# Patient Record
Sex: Male | Born: 1945 | Race: White | Hispanic: No | Smoking: Former smoker
Health system: Southern US, Community
[De-identification: ages and names within clinical notes are randomized; demographics above are authoritative.]

## PROBLEM LIST (undated history)

## (undated) DIAGNOSIS — Z9989 Dependence on other enabling machines and devices: Secondary | ICD-10-CM

## (undated) DIAGNOSIS — Z923 Personal history of irradiation: Secondary | ICD-10-CM

## (undated) DIAGNOSIS — K219 Gastro-esophageal reflux disease without esophagitis: Secondary | ICD-10-CM

## (undated) DIAGNOSIS — I2510 Atherosclerotic heart disease of native coronary artery without angina pectoris: Secondary | ICD-10-CM

## (undated) DIAGNOSIS — G4733 Obstructive sleep apnea (adult) (pediatric): Secondary | ICD-10-CM

## (undated) DIAGNOSIS — C801 Malignant (primary) neoplasm, unspecified: Secondary | ICD-10-CM

## (undated) DIAGNOSIS — M1990 Unspecified osteoarthritis, unspecified site: Secondary | ICD-10-CM

## (undated) DIAGNOSIS — I10 Essential (primary) hypertension: Secondary | ICD-10-CM

## (undated) DIAGNOSIS — J189 Pneumonia, unspecified organism: Secondary | ICD-10-CM

## (undated) DIAGNOSIS — F419 Anxiety disorder, unspecified: Secondary | ICD-10-CM

## (undated) DIAGNOSIS — E118 Type 2 diabetes mellitus with unspecified complications: Secondary | ICD-10-CM

## (undated) DIAGNOSIS — E785 Hyperlipidemia, unspecified: Secondary | ICD-10-CM

## (undated) DIAGNOSIS — R0600 Dyspnea, unspecified: Secondary | ICD-10-CM

## (undated) DIAGNOSIS — C7970 Secondary malignant neoplasm of unspecified adrenal gland: Secondary | ICD-10-CM

## (undated) DIAGNOSIS — Z8619 Personal history of other infectious and parasitic diseases: Secondary | ICD-10-CM

## (undated) HISTORY — DX: Obstructive sleep apnea (adult) (pediatric): G47.33

## (undated) HISTORY — DX: Essential (primary) hypertension: I10

## (undated) HISTORY — DX: Personal history of irradiation: Z92.3

## (undated) HISTORY — PX: COLONOSCOPY W/ POLYPECTOMY: SHX1380

## (undated) HISTORY — DX: Dependence on other enabling machines and devices: Z99.89

## (undated) HISTORY — DX: Type 2 diabetes mellitus with unspecified complications: E11.8

## (undated) HISTORY — DX: Hyperlipidemia, unspecified: E78.5

## (undated) HISTORY — DX: Anxiety disorder, unspecified: F41.9

## (undated) HISTORY — DX: Malignant (primary) neoplasm, unspecified: C80.1

## (undated) HISTORY — PX: EYE SURGERY: SHX253

## (undated) HISTORY — PX: CARDIAC CATHETERIZATION: SHX172

---

## 2001-10-10 ENCOUNTER — Emergency Department (HOSPITAL_COMMUNITY): Admission: EM | Admit: 2001-10-10 | Discharge: 2001-10-11 | Payer: Self-pay | Admitting: Emergency Medicine

## 2001-11-15 ENCOUNTER — Ambulatory Visit (HOSPITAL_BASED_OUTPATIENT_CLINIC_OR_DEPARTMENT_OTHER): Admission: RE | Admit: 2001-11-15 | Discharge: 2001-11-15 | Payer: Self-pay | Admitting: Otolaryngology

## 2001-12-14 ENCOUNTER — Encounter: Admission: RE | Admit: 2001-12-14 | Discharge: 2001-12-29 | Payer: Self-pay | Admitting: Orthopedic Surgery

## 2009-03-03 ENCOUNTER — Encounter (INDEPENDENT_AMBULATORY_CARE_PROVIDER_SITE_OTHER): Payer: Self-pay | Admitting: *Deleted

## 2009-09-29 ENCOUNTER — Encounter (INDEPENDENT_AMBULATORY_CARE_PROVIDER_SITE_OTHER): Payer: Self-pay | Admitting: *Deleted

## 2009-10-20 ENCOUNTER — Encounter (INDEPENDENT_AMBULATORY_CARE_PROVIDER_SITE_OTHER): Payer: Self-pay | Admitting: *Deleted

## 2009-10-23 ENCOUNTER — Ambulatory Visit: Payer: Self-pay | Admitting: Gastroenterology

## 2009-11-07 ENCOUNTER — Ambulatory Visit: Payer: Self-pay | Admitting: Gastroenterology

## 2009-11-09 ENCOUNTER — Encounter: Payer: Self-pay | Admitting: Gastroenterology

## 2010-09-23 ENCOUNTER — Emergency Department (HOSPITAL_COMMUNITY): Admission: EM | Admit: 2010-09-23 | Discharge: 2010-09-23 | Payer: Self-pay | Admitting: Emergency Medicine

## 2011-01-08 NOTE — Letter (Signed)
Summary: [facility] Instructions  [facility]  [address] [place], [place] [address]   Phone: [phone]  Fax: [phone]       Christian [name]    1946-05-13    MRN: [id number]        Procedure Day Dorna Bloom: Jake Shark  11/07/09     Arrival Time: 9:30AM     Procedure Time: 10:30AM     Location of Procedure:                    Juliann Pares _  [facility] (4th Floor)                        PREPARATION FOR COLONOSCOPY WITH MOVIPREP   Starting 5 days prior to your procedure 11/02/09 do not eat nuts, seeds, popcorn, corn, beans, peas,  salads, or any raw vegetables.  Do not take any fiber supplements (e.g. Metamucil, Citrucel, and Benefiber).  THE DAY BEFORE YOUR PROCEDURE         DATE: 11/06/09  DAY: MONDAY  1.  Drink clear liquids the entire day-NO SOLID FOOD  2.  Do not drink anything colored red or purple.  Avoid juices with pulp.  No orange juice.  3.  Drink at least 64 oz. (8 glasses) of fluid/clear liquids during the day to prevent dehydration and help the prep work efficiently.  CLEAR LIQUIDS INCLUDE: Water Jello Ice Popsicles Tea (sugar ok, no milk/cream) Powdered fruit flavored drinks Coffee (sugar ok, no milk/cream) Gatorade Juice: apple, white grape, white cranberry  Lemonade Clear bullion, consomm, broth Carbonated beverages (any kind) Strained chicken noodle soup Hard Candy                             4.  In the morning, mix first dose of MoviPrep solution:    Empty 1 Pouch A and 1 Pouch B into the disposable container    Add lukewarm drinking water to the top line of the container. Mix to dissolve    Refrigerate (mixed solution should be used within 24 hrs)  5.  Begin drinking the prep at 5:00 p.m. The MoviPrep container is divided by 4 marks.   Every 15 minutes drink the solution down to the next mark (approximately 8 oz) until the full liter is complete.   6.  Follow completed prep with 16 oz of clear liquid of your choice  (Nothing red or purple).  Continue to drink clear liquids until bedtime.  7.  Before going to bed, mix second dose of MoviPrep solution:    Empty 1 Pouch A and 1 Pouch B into the disposable container    Add lukewarm drinking water to the top line of the container. Mix to dissolve    Refrigerate  THE DAY OF YOUR PROCEDURE      DATE: 11/07/09  DAY: TUESDAY  Beginning at 5:30a.m. (5 hours before procedure):         1. Every 15 minutes, drink the solution down to the next mark (approx 8 oz) until the full liter is complete.  2. Follow completed prep with 16 oz. of clear liquid of your choice.    3. You may drink clear liquids until 8:30AM (2 HOURS BEFORE PROCEDURE).   MEDICATION INSTRUCTIONS  Unless otherwise instructed, you should take regular prescription medications with a small sip of water   as early as possible the morning of your procedure.  OTHER INSTRUCTIONS  You will need a responsible adult at least 63 years of age to accompany you and drive you home.   This person must remain in the waiting room during your procedure.  Wear loose fitting clothing that is easily removed.  Leave jewelry and other valuables at home.  However, you may wish to bring a book to read or  an iPod/MP3 player to listen to music as you wait for your procedure to start.  Remove all body piercing jewelry and leave at home.  Total time from sign-in until discharge is approximately 2-3 hours.  You should go home directly after your procedure and rest.  You can resume normal activities the  day after your procedure.  The day of your procedure you should not:   Drive   Make legal decisions   Operate machinery   Drink alcohol   Return to work  You will receive specific instructions about eating, activities and medications before you leave.    The above instructions have been reviewed and explained to me by   _______________________    I fully understand and can verbalize  these instructions _____________________________ Date _________

## 2011-01-08 NOTE — Letter (Signed)
Summary: Patient Notice- Polyp Results  [facility]  [address] [place], [place] [address]   Phone: [phone]  Fax: [phone]        November 09, 2009 MRN: [id number]    [facility] [address] [place], [place]  [address]    Dear Mr. Crymes,  I am pleased to inform you that the colon polyp(s) removed during your recent colonoscopy was (were) found to be benign (no cancer detected) upon pathologic examination.  I recommend you have a repeat colonoscopy examination in 5 years to look for recurrent polyps, as having colon polyps increases your risk for having recurrent polyps or even colon cancer in the future.  Should you develop new or worsening symptoms of abdominal pain, bowel habit changes or bleeding from the rectum or bowels, please schedule an evaluation with either your primary care physician or with me.  Continue treatment plan as outlined the day of your exam.  Please call us if you are having persistent problems or have questions about your condition that have not been fully answered at this time.  Sincerely,  Meryl Dare MD [facility]  This letter has been electronically signed by your physician.  Appended Document: Patient Notice- Polyp Results Letter mailed 12.6.10.

## 2011-01-08 NOTE — Miscellaneous (Signed)
Summary: [facility] Previsit/prep  Clinical Lists Changes  Medications: Added new medication of MOVIPREP 100 GM  SOLR (PEG-KCL-NACL-NASULF-NA ASC-C) As per prep instructions. - Signed Rx of MOVIPREP 100 GM  SOLR (PEG-KCL-NACL-NASULF-NA ASC-C) As per prep instructions.;  #1 x 0;  Signed;  Entered by: Wyona Almas RN;  Authorized by: Meryl Dare MD [facility];  Method used: Electronically to [facility]  [address] [id number] [address] [place], [place]  [address], Ph: [phone] or [phone], Fax: [phone] Observations: Added new observation of NKA: T (10/23/2009 10:54)    Prescriptions: MOVIPREP 100 GM  SOLR (PEG-KCL-NACL-NASULF-NA ASC-C) As per prep instructions.  #1 x 0   Entered by:   Wyona Almas RN   Authorized by:   Meryl Dare MD [facility]   Signed by:   Wyona Almas RN on 10/23/2009   Method used:   Electronically to        [facility]  [address] [id number] (retail)       [address]       [place], [place]  [address]       Ph: [phone] or [phone]       Fax: [phone]   RxID:   [id number]

## 2011-01-08 NOTE — Letter (Signed)
Summary: Previsit letter  [facility]  [address] [place], [place] [address]   Phone: [phone]  Fax: [phone]       09/29/2009 MRN: [id number]  [facility] [address] [place], [place]  [address]  [place]  Dear Christian [name],  Welcome to the Gastroenterology Division at [facility].    You are scheduled to see a nurse for your pre-procedure visit on 10-23-09 at 11am on the 3rd floor at [facility], [address]  We ask that you try to arrive at our office 15 minutes prior to your appointment time to allow for check-in.  Your nurse visit will consist of discussing your medical and surgical history, your immediate family medical history, and your medications.    Please bring a complete list of all your medications or, if you prefer, bring the medication bottles and we will list them.  We will need to be aware of both prescribed and over the counter drugs.  We will need to know exact dosage information as well.  If you are on blood thinners (Coumadin, Plavix, Aggrenox, Ticlid, etc.) please call our office today/prior to your appointment, as we need to consult with your physician about holding your medication.   Please be prepared to read and sign documents such as consent forms, a financial agreement, and acknowledgement forms.  If necessary, and with your consent, a friend or relative is welcome to sit-in on the nurse visit with you.  Please bring your insurance card so that we may make a copy of it.  If your insurance requires a referral to see a specialist, please bring your referral form from your primary care physician.  No co-pay is required for this nurse visit.     If you cannot keep your appointment, please call [phone] to cancel or reschedule prior to your appointment date.  This allows [place] the opportunity to schedule an appointment for another patient in need of care.    Thank you for choosing [facility] for your medical  needs.  We appreciate the opportunity to care for you.  Please visit [place] at our website  to learn more about our practice.                     Sincerely.                                                                                                                   The Gastroenterology Division

## 2011-01-08 NOTE — Procedures (Signed)
Summary: Colonoscopy  Patient: [name] Note: All result statuses are Final unless otherwise noted.  Tests: (1) Colonoscopy (COL)   COL Colonoscopy           DONE     [facility]     [address]     [place], [place]  [address]           COLONOSCOPY PROCEDURE REPORT           PATIENT:  Christian [name], Christian [name]  MR#:  [id number]     BIRTHDATE:  06-01-1946, 63 yrs. old  GENDER:  male           ENDOSCOPIST:  Judie Petit T. Russella Dar, MD, [facility]           PROCEDURE DATE:  11/07/2009     PROCEDURE:  Colonoscopy with biopsy, with snare polypectomy, and     with hot biopsy     ASA CLASS:  Class II     INDICATIONS:  1) follow-up of polyp, multiple right sided     hyperplastic polyps           MEDICATIONS:   Fentanyl 75 mcg IV, Versed 8 mg IV           DESCRIPTION OF PROCEDURE:   After the risks benefits and     alternatives of the procedure were thoroughly explained, informed     consent was obtained.  Digital rectal exam was performed and     revealed no abnormalities.   The LB [id number]-[id number] endoscope     was introduced through the anus and advanced to the cecum, which     was identified by both the appendix and ileocecal valve, without     limitations.  The quality of the prep was good, using MoviPrep.     The instrument was then slowly withdrawn as the colon was fully     examined.     <<PROCEDUREIMAGES>>           FINDINGS:  A sessile polyp was found in the ascending colon. It     was 6 mm in size. Polyp was snared without cautery. Retrieval was     successful.  A sessile polyp was found in the ascending colon. It     was 3 mm in size. The polyp was removed using cold biopsy forceps.     A sessile polyp was found in the distal transverse colon. It was 4     mm in size. The polyp was removed using cold biopsy forceps.  Six     polyps were found in the rectum. They were 2 - 4 mm in size. With     hot biopsy forceps, the polyps were cauterized and biopsies were     obtained and  sent to pathology. Mild diverticulosis was found in     the sigmoid colon. This was otherwise a normal examination of the     colon.  Retroflexed views in the rectum revealed internal     hemorrhoids, small.  The time to cecum =  2.67  minutes. The scope     was then withdrawn (time =  16.75  min) from the patient and the     procedure completed.           COMPLICATIONS:  None           ENDOSCOPIC IMPRESSION:     1) 6 mm sessile polyp in the ascending colon     2) 3 mm  sessile polyp in the ascending colon     3) 4 mm sessile polyp in the distal transverse colon     4) 2 - 4 mm, six polyps in the rectum     5) Mild diverticulosis in the sigmoid colon     6) Internal hemorrhoids           RECOMMENDATIONS:     1) No aspirin or NSAID's for 2 weeks     2) Await pathology results     3) Repeat Colonoscopy in 5 years.     4) high fiber diet           Myda Detwiler T. Russella Dar, MD, [facility]           n.     eSIGNED:   Venita Lick. Odies Desa at 11/07/2009 10:57 AM           Shearon Balo, [id number]  Note: An exclamation mark (!) indicates a result that was not dispersed into the flowsheet. Document Creation Date: 11/07/2009 10:56 AM _______________________________________________________________________  (1) Order result status: Final Collection or observation date-time: 11/07/2009 10:51 Requested date-time:  Receipt date-time:  Reported date-time:  Referring Physician:   Ordering Physician: Claudette Head [phone]) Specimen Source:  Source: Launa Grill Order Number: [id number] Lab site:   Appended Document: Colonoscopy     Procedures Next Due Date:    Colonoscopy: 10/2014

## 2011-01-08 NOTE — Letter (Signed)
Summary: Recall Colonoscopy Letter  [facility]  [address]   [place], [place] [address]   Phone: [phone]  Fax: [phone]      March 03, 2009 MRN: [id number]   [facility] [address] [place], [place]  [address]   Dear Christian [name],   According to your medical record, it is time for you to schedule a Colonoscopy. The American Cancer Society recommends this procedure as a method to detect early colon cancer. Patients with a family history of colon cancer, or a personal history of colon polyps or inflammatory bowel disease are at increased risk.  This letter has beeen generated based on the recommendations made at the time of your procedure. If you feel that in your particular situation this may no longer apply, please contact our office.  Please call our office at [phone] to schedule this appointment or to update your records at your earliest convenience.  Thank you for cooperating with [place] to provide you with the very best care possible.   Sincerely,  Judie Petit T. Russella Dar, M.D.  [facility] [phone]

## 2012-01-09 ENCOUNTER — Encounter: Payer: Self-pay | Admitting: Physician Assistant

## 2012-01-09 NOTE — Telephone Encounter (Signed)
  Encounter opened in error. csj

## 2012-03-25 ENCOUNTER — Other Ambulatory Visit: Payer: Self-pay | Admitting: Internal Medicine

## 2012-05-14 ENCOUNTER — Encounter: Payer: Self-pay | Admitting: Family Medicine

## 2012-05-14 ENCOUNTER — Ambulatory Visit (INDEPENDENT_AMBULATORY_CARE_PROVIDER_SITE_OTHER): Payer: Federal, State, Local not specified - PPO | Admitting: Family Medicine

## 2012-05-14 ENCOUNTER — Ambulatory Visit: Payer: Federal, State, Local not specified - PPO

## 2012-05-14 VITALS — BP 122/70 | HR 62 | Temp 97.8°F | Resp 20 | Ht 72.0 in | Wt 290.0 lb

## 2012-05-14 DIAGNOSIS — R0600 Dyspnea, unspecified: Secondary | ICD-10-CM

## 2012-05-14 DIAGNOSIS — R05 Cough: Secondary | ICD-10-CM

## 2012-05-14 DIAGNOSIS — I10 Essential (primary) hypertension: Secondary | ICD-10-CM

## 2012-05-14 DIAGNOSIS — Z0000 Encounter for general adult medical examination without abnormal findings: Secondary | ICD-10-CM

## 2012-05-14 DIAGNOSIS — E785 Hyperlipidemia, unspecified: Secondary | ICD-10-CM

## 2012-05-14 DIAGNOSIS — M791 Myalgia, unspecified site: Secondary | ICD-10-CM

## 2012-05-14 DIAGNOSIS — K219 Gastro-esophageal reflux disease without esophagitis: Secondary | ICD-10-CM

## 2012-05-14 DIAGNOSIS — J309 Allergic rhinitis, unspecified: Secondary | ICD-10-CM

## 2012-05-14 DIAGNOSIS — R059 Cough, unspecified: Secondary | ICD-10-CM

## 2012-05-14 DIAGNOSIS — G4700 Insomnia, unspecified: Secondary | ICD-10-CM

## 2012-05-14 LAB — COMPREHENSIVE METABOLIC PANEL
ALT: 17 U/L (ref 0–53)
AST: 16 U/L (ref 0–37)
Albumin: 4.4 g/dL (ref 3.5–5.2)
Alkaline Phosphatase: 61 U/L (ref 39–117)
BUN: 12 mg/dL (ref 6–23)
CO2: 23 mEq/L (ref 19–32)
Calcium: 9.4 mg/dL (ref 8.4–10.5)
Chloride: 106 mEq/L (ref 96–112)
Creat: 0.92 mg/dL (ref 0.50–1.35)
Glucose, Bld: 118 mg/dL — ABNORMAL HIGH (ref 70–99)
Potassium: 4.6 mEq/L (ref 3.5–5.3)
Sodium: 138 mEq/L (ref 135–145)
Total Bilirubin: 0.5 mg/dL (ref 0.3–1.2)
Total Protein: 6.5 g/dL (ref 6.0–8.3)

## 2012-05-14 LAB — POCT URINALYSIS DIPSTICK
Bilirubin, UA: NEGATIVE
Blood, UA: NEGATIVE
Glucose, UA: NEGATIVE
Ketones, UA: NEGATIVE
Leukocytes, UA: NEGATIVE
Nitrite, UA: NEGATIVE
Protein, UA: NEGATIVE
Spec Grav, UA: 1.025
Urobilinogen, UA: 0.2
pH, UA: 5.5

## 2012-05-14 LAB — CBC WITH DIFFERENTIAL/PLATELET
Basophils Absolute: 0.1 10*3/uL (ref 0.0–0.1)
Basophils Relative: 1 % (ref 0–1)
Eosinophils Absolute: 0.2 10*3/uL (ref 0.0–0.7)
Eosinophils Relative: 3 % (ref 0–5)
HCT: 44.3 % (ref 39.0–52.0)
Hemoglobin: 15.2 g/dL (ref 13.0–17.0)
Lymphocytes Relative: 31 % (ref 12–46)
Lymphs Abs: 2.3 10*3/uL (ref 0.7–4.0)
MCH: 33.8 pg (ref 26.0–34.0)
MCHC: 34.3 g/dL (ref 30.0–36.0)
MCV: 98.4 fL (ref 78.0–100.0)
Monocytes Absolute: 0.6 10*3/uL (ref 0.1–1.0)
Monocytes Relative: 9 % (ref 3–12)
Neutro Abs: 4.2 10*3/uL (ref 1.7–7.7)
Neutrophils Relative %: 57 % (ref 43–77)
Platelets: 184 10*3/uL (ref 150–400)
RBC: 4.5 MIL/uL (ref 4.22–5.81)
RDW: 13.2 % (ref 11.5–15.5)
WBC: 7.4 10*3/uL (ref 4.0–10.5)

## 2012-05-14 LAB — IFOBT (OCCULT BLOOD): IFOBT: NEGATIVE

## 2012-05-14 LAB — POCT UA - MICROSCOPIC ONLY
Bacteria, U Microscopic: NEGATIVE
Casts, Ur, LPF, POC: NEGATIVE
Crystals, Ur, HPF, POC: NEGATIVE
Yeast, UA: NEGATIVE

## 2012-05-14 LAB — LIPID PANEL
Cholesterol: 117 mg/dL (ref 0–200)
HDL: 31 mg/dL — ABNORMAL LOW (ref >39)
LDL Cholesterol: 70 mg/dL (ref 0–99)
Total CHOL/HDL Ratio: 3.8 Ratio
Triglycerides: 78 mg/dL (ref <150)
VLDL: 16 mg/dL (ref 0–40)

## 2012-05-14 LAB — PSA: PSA: 1.77 ng/mL (ref <=4.00)

## 2012-05-14 MED ORDER — NIACIN-SIMVASTATIN ER 500-20 MG PO TB24: 1.0000 | ORAL_TABLET | Freq: Every day | ORAL | Status: DC

## 2012-05-14 MED ORDER — FEXOFENADINE-PSEUDOEPHED ER 60-120 MG PO TB12: 1.0000 | ORAL_TABLET | Freq: Two times a day (BID) | ORAL | Status: DC

## 2012-05-14 MED ORDER — CLONAZEPAM 0.5 MG PO TABS: 0.5000 mg | ORAL_TABLET | Freq: Every evening | ORAL | Status: DC | PRN

## 2012-05-14 MED ORDER — ESOMEPRAZOLE MAGNESIUM 20 MG PO CPDR: 20.0000 mg | DELAYED_RELEASE_CAPSULE | Freq: Every day | ORAL | Status: DC

## 2012-05-14 MED ORDER — METHOCARBAMOL 750 MG PO TABS: 750.0000 mg | ORAL_TABLET | Freq: Four times a day (QID) | ORAL | Status: DC

## 2012-05-14 MED ORDER — LISINOPRIL 10 MG PO TABS: 10.0000 mg | ORAL_TABLET | Freq: Every day | ORAL | Status: DC

## 2012-05-14 NOTE — Progress Notes (Addendum)
@UMFCLOGO @  Patient ID: Christian [name] MRN: [id number], DOB: 1946/06/05 66 y.o. Date of Encounter: 05/14/2012, 8:17 AM  Primary Physician: No primary provider on file.  Chief Complaint: Physical (CPE)  HPI: 66 y.o. y/o male with history noted below here for CPE.  Doing well.  Works in Industrial/product designer Has h/o skin cancer He does note shortness of breath on exertion.  He is being followed for this by cardiologist in [facility].  Had cardiac cath 2010 which was negative. Now working two days a week in the [employer] which has dropped off due to internet Wife had ovarian cancer, cva, and hip replacement  Review of Systems: no items checked on blue sheet Consitutional: No fever, chills, fatigue, night sweats, lymphadenopathy, or weight changes. Eyes: No visual changes, eye redness, or discharge. ENT/Mouth: Ears: No otalgia, tinnitus, hearing loss, discharge. Nose: No congestion, rhinorrhea, sinus pain, or epistaxis. Throat: No sore throat, post nasal drip, or teeth pain. Cardiovascular: No CP, palpitations, diaphoresis, DOE, edema, orthopnea, PND. Respiratory: No cough, hemoptysis, SOB, or wheezing. Gastrointestinal: No anorexia, dysphagia, reflux, pain, nausea, vomiting, hematemesis, diarrhea, constipation, BRBPR, or melena. Genitourinary: No dysuria, frequency, urgency, hematuria, incontinence, nocturia, decreased urinary stream, discharge, impotence, or testicular pain/masses. Musculoskeletal: No decreased ROM, myalgias, stiffness, joint swelling, or weakness. Skin: No rash, erythema, lesion changes, pain, warmth, jaundice, or pruritis. Neurological: No headache, dizziness, syncope, seizures, tremors, memory loss, coordination problems, or paresthesias. Psychological: No anxiety, depression, hallucinations, SI/HI. Endocrine: No fatigue, polydipsia, polyphagia, polyuria, or known diabetes. All other systems were reviewed and are otherwise negative.  Past Medical History  Diagnosis  Date  . Hyperlipidemia   . Hypertension   . Anxiety   . Cancer      Past Surgical History  Procedure Date  . Eye surgery     Home Meds:  Prior to Admission medications   Medication Sig Start Date End Date Taking? Authorizing Provider  aspirin 325 MG tablet Take 325 mg by mouth daily.   Yes Historical Provider, MD  clonazePAM (KLONOPIN) 0.5 MG tablet Take 0.5 mg by mouth daily.   Yes Historical Provider, MD  esomeprazole (NEXIUM) 20 MG capsule Take 20 mg by mouth daily before breakfast.   Yes Historical Provider, MD  fexofenadine-pseudoephedrine (ALLEGRA-D) 60-120 MG per tablet Take 1 tablet by mouth 2 (two) times daily.   Yes Historical Provider, MD  ibuprofen (ADVIL,MOTRIN) 800 MG tablet Take 800 mg by mouth every 8 (eight) hours as needed.   Yes Historical Provider, MD  lisinopril (PRINIVIL,ZESTRIL) 5 MG tablet Take 5 mg by mouth daily.   Yes Historical Provider, MD  Melaton-Thean-Cham-PassF-LBalm (MELATONIN + L-THEANINE PO) Take by mouth.   Yes Historical Provider, MD  methocarbamol (ROBAXIN) 750 MG tablet Take 750 mg by mouth 4 (four) times daily.   Yes Historical Provider, MD  niacin-simvastatin (SIMCOR) 500-20 MG 24 hr tablet Take 1 tablet by mouth at bedtime. 500-40 mg per pt   Yes Historical Provider, MD  vitamin E 100 UNIT capsule Take 100 Units by mouth daily.   Yes Historical Provider, MD    Allergies: No Known Allergiesallergic to Celebrex with itching  History   Social History  . Marital Status: Married    Spouse Name: N/A    Number of Children: N/A  . Years of Education: N/A   Occupational History  . Not on file.   Social History Main Topics  . Smoking status: Current Everyday Smoker  . Smokeless tobacco: Not on file  . Alcohol Use:  Not on file  . Drug Use: Not on file  . Sexually Active: Not on file   Other Topics Concern  . Not on file   Social History Narrative  . No narrative on file    Family History  Problem Relation Age of Onset  . Heart  disease Mother   . Heart disease Father     Physical Exam: Blood pressure 122/70, pulse 62, temperature 97.8 F (36.6 C), resp. rate 20, height 6' (1.829 m), weight 290 lb (131.543 kg).  General: Well developed, well nourished, in no acute distress. HEENT: Normocephalic, atraumatic. Conjunctiva pink, sclera non-icteric. Pupils 2 mm constricting to 1 mm, round, regular, and equally reactive to light and accomodation. EOMI. Internal auditory canal clear. TMs with good cone of light and without pathology. Nasal mucosa pink. Nares are without discharge. No sinus tenderness. Oral mucosa pink. Dentition receding gums. Pharynx without exudate.   Neck: Supple. Trachea midline. No thyromegaly. Full ROM. No lymphadenopathy. Lungs: Clear to auscultation bilaterally without wheezes, rales, or rhonchi. Breathing is of normal effort and unlabored. Cardiovascular: RRR with S1 S2. No murmurs, rubs, or gallops appreciated. Distal pulses 2+ symmetrically. No carotid or abdominal bruits. Abdomen: Soft, non-tender, non-distended with normoactive bowel sounds. No hepatosplenomegaly or masses. No rebound/guarding. No CVA tenderness. Without hernias.  Rectal: No external hemorrhoids or fissures. Rectal vault without masses.  Genitourinary:  uncircumcised male. No penile lesions. Testes descended bilaterally, and smooth without tenderness or masses.  Musculoskeletal: Full range of motion and 5/5 strength throughout. Without swelling, atrophy, tenderness, crepitus, or warmth. Extremities without clubbing, cyanosis, or edema. Calves supple. Skin: Warm and moist without erythema, ecchymosis, wounds, or rash. Neuro: A+Ox3. CN II-XII grossly intact. Moves all extremities spontaneously. Full sensation throughout. Normal gait. DTR 2+ throughout upper and lower extremities. Finger to nose intact. Psych:  Responds to questions appropriately with a normal affect.   Studies: CBC, CMET, Lipid, PSA pending [facility] reading (PRIMARY) by   Dr. Milus Glazier CXR:. No infiltrate or cardiomegaly   Assessment/Plan:  66 y.o. y/o  male here for CPE.  He has the following problems: 1. Hypertension  lisinopril (PRINIVIL,ZESTRIL) 10 MG tablet, DG Chest 2 View  2. Insomnia  clonazePAM (KLONOPIN) 0.5 MG tablet  3. GERD (gastroesophageal reflux disease)  esomeprazole (NEXIUM) 20 MG capsule  4. Allergic rhinitis  fexofenadine-pseudoephedrine (ALLEGRA-D) 60-120 MG per tablet  5. Myalgia  methocarbamol (ROBAXIN) 750 MG tablet  6. Hyperlipidemia  niacin-simvastatin (SIMCOR) 500-20 MG 24 hr tablet  7. Dyspnea on exertion  DG Chest 2 View   Given Rx for Zostavax Follow [facility] with [facility] re: DOE -  Signed, Elvina Sidle, MD 05/14/2012 8:17 AM

## 2012-08-28 ENCOUNTER — Other Ambulatory Visit: Payer: Self-pay | Admitting: Internal Medicine

## 2012-11-06 ENCOUNTER — Other Ambulatory Visit: Payer: Self-pay | Admitting: Family Medicine

## 2012-12-12 ENCOUNTER — Ambulatory Visit (INDEPENDENT_AMBULATORY_CARE_PROVIDER_SITE_OTHER): Payer: Federal, State, Local not specified - PPO | Admitting: Family Medicine

## 2012-12-12 VITALS — BP 117/71 | HR 66 | Temp 98.1°F | Resp 20 | Ht 72.0 in | Wt 290.8 lb

## 2012-12-12 DIAGNOSIS — R05 Cough: Secondary | ICD-10-CM

## 2012-12-12 DIAGNOSIS — R509 Fever, unspecified: Secondary | ICD-10-CM

## 2012-12-12 DIAGNOSIS — R059 Cough, unspecified: Secondary | ICD-10-CM

## 2012-12-12 LAB — POCT INFLUENZA A/B
Influenza A, POC: POSITIVE
Influenza B, POC: NEGATIVE

## 2012-12-12 MED ORDER — DOXYCYCLINE HYCLATE 100 MG PO TABS: 100.0000 mg | ORAL_TABLET | Freq: Two times a day (BID) | ORAL | Status: DC

## 2012-12-12 MED ORDER — HYDROCODONE-HOMATROPINE 5-1.5 MG/5ML PO SYRP: 5.0000 mL | ORAL_SOLUTION | Freq: Three times a day (TID) | ORAL | Status: DC | PRN

## 2012-12-12 NOTE — Progress Notes (Signed)
[facility] [address] [place] [address] [phone]  Date:  12/12/2012   Name:  Christian [name]   DOB:  10-Sep-1946   MRN:  [id number]  PCP:  Elvina Sidle, MD    Chief Complaint: Cough, Fever and Shortness of Breath   History of Present Illness:  Christian [name] is a 67 y.o. very pleasant male patient who presents with the following:  He was traveling last weekend- flew to [place] (which is not a long flight) and came home with a cough. His abdominal muscles are sore from coughing so much.  He has been ill for 5-6 days.  He cannot smoke due to being ill.  He is not coughing up much- is sometimes produces some white phlegm.   He has been concerned that he is running a fever- usually in the afternoon he will go up, reports a temperature of 101 last night.   He has noted chills, but no aches, he does have fatigue.      He does not have an earache, but just today he noted a mild ST.   No GI symptoms except his appetite is reduced. He is able to eat, however  There is no problem list on file for this patient.   Past Medical History  Diagnosis Date  . Hyperlipidemia   . Hypertension   . Anxiety   . Cancer     Past Surgical History  Procedure Date  . Eye surgery     History  Substance Use Topics  . Smoking status: Current Every Day Smoker  . Smokeless tobacco: Not on file  . Alcohol Use: Not on file    Family History  Problem Relation Age of Onset  . Heart disease Mother   . Heart disease Father     Allergies  Allergen Reactions  . Celebrex (Celecoxib) Itching    Medication list has been reviewed and updated.  Current Outpatient Prescriptions on File Prior to Visit  Medication Sig Dispense Refill  . aspirin 325 MG tablet Take 325 mg by mouth daily.      . clonazePAM (KLONOPIN) 0.5 MG tablet Take 1 tablet (0.5 mg total) by mouth at bedtime as needed for anxiety.  90 tablet  1  . esomeprazole (NEXIUM) 20 MG capsule Take 1  capsule (20 mg total) by mouth daily before breakfast.  90 capsule  3  . fexofenadine-pseudoephedrine (ALLEGRA-D) 60-120 MG per tablet Take 1 tablet by mouth 2 (two) times daily.  180 tablet  3  . ibuprofen (ADVIL,MOTRIN) 800 MG tablet TAKE 1 TABLET BY MOUTH EVERY 8 HOURS AS NEEDED FOR JOINT PAIN  30 tablet  1  . lisinopril (PRINIVIL,ZESTRIL) 10 MG tablet Take 1 tablet (10 mg total) by mouth daily.  90 tablet  3  . Melaton-Thean-Cham-PassF-LBalm (MELATONIN + L-THEANINE PO) Take by mouth.      . methocarbamol (ROBAXIN) 750 MG tablet TAKE 1 TABLET (750 MG TOTAL) BY MOUTH 4 (FOUR) TIMES DAILY.  180 tablet  1  . niacin-simvastatin (SIMCOR) 500-20 MG 24 hr tablet Take 1 tablet by mouth at bedtime. 500-40 mg per pt  90 tablet  3  . vitamin E 100 UNIT capsule Take 100 Units by mouth daily.        Review of Systems:  As per HPI- otherwise negative.   Physical Examination: Filed Vitals:   12/12/12 1047  BP: 117/71  Pulse: 66  Temp: 98.1 F (36.7 C)  Resp: 20   Filed  Vitals:   12/12/12 1047  Height: 6' (1.829 m)  Weight: 290 lb 12.8 oz (131.906 kg)   Body mass index is 39.44 kg/(m^2). Ideal Body Weight: Weight in (lb) to have BMI = 25: 183.9   GEN: WDWN, NAD, Non-toxic, A & O x 3, appearance of a chronic smoker HEENT: Atraumatic, Normocephalic. Neck supple. No masses, No LAD.  Bilateral TM wnl, oropharynx normal.  PEERL,EOMI.   Ears and Nose: No external deformity. CV: RRR, No M/G/R. No JVD. No thrill. No extra heart sounds. PULM: CTA B, no wheezes, crackles, rhonchi. No retractions. No resp. distress. No accessory muscle use. ABD: S, NT, ND. No rebound. No HSM. Central obesity.  No particular tenderness he is slightly tender in his oblique muscles bilaterally from coughing EXTR: No c/c/e NEURO Normal gait.  PSYCH: Normally interactive. Conversant. Not depressed or anxious appearing.  Calm demeanor.   Results for orders placed in visit on 12/12/12  POCT INFLUENZA A/B      Component  Value Range   Influenza A, POC Positive     Influenza B, POC Negative      Assessment and Plan: 1. Fever  POCT Influenza A/B  2. Cough  HYDROcodone-homatropine (HYCODAN) 5-1.5 MG/5ML syrup, doxycycline (VIBRA-TABS) 100 MG tablet   Influenza.  He has been ill for nearly a week already, so tamilflu is unlikely to be helpful.  He is interested in using an antibiotic, but explained that this will not be helpful either for a viral illness.  Rx for hycodan to use as needed- cautioned regarding sedation.  Did give him an rx for doxy to use if not better in the next few days- he is at higher risk for bronchitis as he is a smoker    Let me know if not better in the next few days- Sooner if worse.     Abbe Amsterdam, MD

## 2012-12-12 NOTE — Patient Instructions (Addendum)
You tested positive for the flu today- remember that the flu is a virus, and antibiotics kill bacteria.  Use the cough syrup as needed -however remember that it can make you sleepy and should not be mixed with klonopin, muscle relaxers or alcohol.  If your are not better in the next few days you can fill and use the doxycycline antibiotic in case you have developed bronchitis on top of your flu. If you are getting worse or are not better in the next few days please let us know

## 2012-12-15 ENCOUNTER — Telehealth: Payer: Self-pay

## 2012-12-15 DIAGNOSIS — R059 Cough, unspecified: Secondary | ICD-10-CM

## 2012-12-15 DIAGNOSIS — R05 Cough: Secondary | ICD-10-CM

## 2012-12-15 NOTE — Telephone Encounter (Signed)
PT STATES HE WAS GIVEN SOME MEDICINE, BUT WOULD LIKE TO HAVE AN INHALER CALLED IN. PLEASE CALL [phone]    [facility] [address]

## 2012-12-16 ENCOUNTER — Telehealth: Payer: Self-pay | Admitting: Radiology

## 2012-12-16 DIAGNOSIS — R05 Cough: Secondary | ICD-10-CM

## 2012-12-16 DIAGNOSIS — R059 Cough, unspecified: Secondary | ICD-10-CM

## 2012-12-16 MED ORDER — ALBUTEROL SULFATE HFA 108 (90 BASE) MCG/ACT IN AERS: 2.0000 | INHALATION_SPRAY | Freq: Four times a day (QID) | RESPIRATORY_TRACT | Status: DC | PRN

## 2012-12-16 NOTE — Telephone Encounter (Signed)
Patient has called about inhaler he has used [employer] in the past and would like this sent in for him. Please advise.

## 2012-12-16 NOTE — Telephone Encounter (Signed)
Dr Patsy Lager, do you want to Rx an inhaler?

## 2012-12-16 NOTE — Telephone Encounter (Signed)
Rx sent 

## 2012-12-16 NOTE — Telephone Encounter (Signed)
Called him back- he is feeling better but would like an albuterol inhaler. He has used these in the past with success.  Will call in albuterol for him

## 2012-12-17 NOTE — Telephone Encounter (Signed)
Patient has already gotten it at the pharmacy.

## 2012-12-21 ENCOUNTER — Other Ambulatory Visit: Payer: Self-pay | Admitting: Family Medicine

## 2013-02-01 ENCOUNTER — Other Ambulatory Visit: Payer: Self-pay | Admitting: Physician Assistant

## 2013-02-01 NOTE — Telephone Encounter (Signed)
Needs office visit.

## 2013-02-08 ENCOUNTER — Other Ambulatory Visit: Payer: Self-pay | Admitting: Family Medicine

## 2013-05-26 ENCOUNTER — Other Ambulatory Visit: Payer: Self-pay | Admitting: Family Medicine

## 2013-06-02 ENCOUNTER — Encounter: Payer: Self-pay | Admitting: Family Medicine

## 2013-06-02 ENCOUNTER — Ambulatory Visit (INDEPENDENT_AMBULATORY_CARE_PROVIDER_SITE_OTHER): Payer: Federal, State, Local not specified - PPO | Admitting: Family Medicine

## 2013-06-02 ENCOUNTER — Ambulatory Visit: Payer: Federal, State, Local not specified - PPO

## 2013-06-02 VITALS — BP 118/71 | HR 76 | Temp 97.6°F | Resp 16 | Ht 72.0 in | Wt 282.6 lb

## 2013-06-02 DIAGNOSIS — G4700 Insomnia, unspecified: Secondary | ICD-10-CM

## 2013-06-02 DIAGNOSIS — M549 Dorsalgia, unspecified: Secondary | ICD-10-CM

## 2013-06-02 DIAGNOSIS — Z0000 Encounter for general adult medical examination without abnormal findings: Secondary | ICD-10-CM

## 2013-06-02 DIAGNOSIS — R0602 Shortness of breath: Secondary | ICD-10-CM

## 2013-06-02 DIAGNOSIS — Z23 Encounter for immunization: Secondary | ICD-10-CM

## 2013-06-02 LAB — CBC
HCT: 45.1 % (ref 39.0–52.0)
Hemoglobin: 15.9 g/dL (ref 13.0–17.0)
MCH: 34 pg (ref 26.0–34.0)
MCHC: 35.3 g/dL (ref 30.0–36.0)
MCV: 96.4 fL (ref 78.0–100.0)
Platelets: 182 10*3/uL (ref 150–400)
RBC: 4.68 MIL/uL (ref 4.22–5.81)
RDW: 13.5 % (ref 11.5–15.5)
WBC: 9.8 10*3/uL (ref 4.0–10.5)

## 2013-06-02 LAB — POCT URINALYSIS DIPSTICK
Bilirubin, UA: NEGATIVE
Blood, UA: NEGATIVE
Glucose, UA: NEGATIVE
Ketones, UA: NEGATIVE
Leukocytes, UA: NEGATIVE
Nitrite, UA: NEGATIVE
Protein, UA: NEGATIVE
Spec Grav, UA: 1.02
Urobilinogen, UA: 0.2
pH, UA: 5.5

## 2013-06-02 LAB — IFOBT (OCCULT BLOOD): IFOBT: NEGATIVE

## 2013-06-02 LAB — POCT GLYCOSYLATED HEMOGLOBIN (HGB A1C): Hemoglobin A1C: 6

## 2013-06-02 MED ORDER — METHOCARBAMOL 750 MG PO TABS: 750.0000 mg | ORAL_TABLET | Freq: Four times a day (QID) | ORAL | Status: DC | PRN

## 2013-06-02 MED ORDER — CLONAZEPAM 0.5 MG PO TABS: 0.5000 mg | ORAL_TABLET | Freq: Every evening | ORAL | Status: DC | PRN

## 2013-06-02 NOTE — Patient Instructions (Signed)
Dentist:  Loni [facility]with on [address] just off [address]  [phone]  Health Maintenance, Males A healthy lifestyle and preventative care can promote health and wellness.  Maintain regular health, dental, and eye exams.  Eat a healthy diet. Foods like vegetables, fruits, whole grains, low-fat dairy products, and lean protein foods contain the nutrients you need without too many calories. Decrease your intake of foods high in solid fats, added sugars, and salt. Get information about a proper diet from your caregiver, if necessary.  Regular physical exercise is one of the most important things you can do for your health. Most adults should get at least 150 minutes of moderate-intensity exercise (any activity that increases your heart rate and causes you to sweat) each week. In addition, most adults need muscle-strengthening exercises on 2 or more days a week.   Maintain a healthy weight. The body mass index (BMI) is a screening tool to identify possible weight problems. It provides an estimate of body fat based on height and weight. Your caregiver can help determine your BMI, and can help you achieve or maintain a healthy weight. For adults 20 years and older:  A BMI below 18.5 is considered underweight.  A BMI of 18.5 to 24.9 is normal.  A BMI of 25 to 29.9 is considered overweight.  A BMI of 30 and above is considered obese.  Maintain normal blood lipids and cholesterol by exercising and minimizing your intake of saturated fat. Eat a balanced diet with plenty of fruits and vegetables. Blood tests for lipids and cholesterol should begin at age 36 and be repeated every 5 years. If your lipid or cholesterol levels are high, you are over 50, or you are a high risk for heart disease, you may need your cholesterol levels checked more frequently.Ongoing high lipid and cholesterol levels should be treated with medicines, if diet and exercise are not effective.  If you smoke, find out from your caregiver  how to quit. If you do not use tobacco, do not start.  If you choose to drink alcohol, do not exceed 2 drinks per day. One drink is considered to be 12 ounces (355 mL) of beer, 5 ounces (148 mL) of wine, or 1.5 ounces (44 mL) of liquor.  Avoid use of street drugs. Do not share needles with anyone. Ask for help if you need support or instructions about stopping the use of drugs.  High blood pressure causes heart disease and increases the risk of stroke. Blood pressure should be checked at least every 1 to 2 years. Ongoing high blood pressure should be treated with medicines if weight loss and exercise are not effective.  If you are 48 to 65 years old, ask your caregiver if you should take aspirin to prevent heart disease.  Diabetes screening involves taking a blood sample to check your fasting blood sugar level. This should be done once every 3 years, after age 32, if you are within normal weight and without risk factors for diabetes. Testing should be considered at a younger age or be carried out more frequently if you are overweight and have at least 1 risk factor for diabetes.  Colorectal cancer can be detected and often prevented. Most routine colorectal cancer screening begins at the age of 57 and continues through age 19. However, your caregiver may recommend screening at an earlier age if you have risk factors for colon cancer. On a yearly basis, your caregiver may provide home test kits to check for hidden blood in  the stool. Use of a small camera at the end of a tube, to directly examine the colon (sigmoidoscopy or colonoscopy), can detect the earliest forms of colorectal cancer. Talk to your caregiver about this at age 62, when routine screening begins. Direct examination of the colon should be repeated every 5 to 10 years through age 74, unless early forms of pre-cancerous polyps or small growths are found.  Hepatitis C blood testing is recommended for all people born from [birth year] through 1963  and any individual with known risks for hepatitis C.  Healthy men should no longer receive prostate-specific antigen (PSA) blood tests as part of routine cancer screening. Consult with your caregiver about prostate cancer screening.  Testicular cancer screening is not recommended for adolescents or adult males who have no symptoms. Screening includes self-exam, caregiver exam, and other screening tests. Consult with your caregiver about any symptoms you have or any concerns you have about testicular cancer.  Practice safe sex. Use condoms and avoid high-risk sexual practices to reduce the spread of sexually transmitted infections (STIs).  Use sunscreen with a sun protection factor (SPF) of 30 or greater. Apply sunscreen liberally and repeatedly throughout the day. You should seek shade when your shadow is shorter than you. Protect yourself by wearing long sleeves, pants, a wide-brimmed hat, and sunglasses year round, whenever you are outdoors.  Notify your caregiver of new moles or changes in moles, especially if there is a change in shape or color. Also notify your caregiver if a mole is larger than the size of a pencil eraser.  A one-time screening for abdominal aortic aneurysm (AAA) and surgical repair of large AAAs by sound wave imaging (ultrasonography) is recommended for ages 27 to 45 years who are current or former smokers.  Stay current with your immunizations. Document Released: 05/23/2008 Document Revised: 02/17/2012 Document Reviewed: 04/22/2011 [facility] Patient Information 2014 [place], [place].

## 2013-06-02 NOTE — Progress Notes (Signed)
Patient ID: Christian [name] MRN: [id number], DOB: 04-30-1946 67 y.o. Date of Encounter: 06/02/2013, 2:14 PM  Primary Physician: Elvina Sidle, MD  Chief Complaint: Physical (CPE)  HPI: 67 y.o. y/o male with history noted below here for CPE.  Doing well. 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 Pt caretaker. Patient has a chronic cough, continues to smoke  Review of Systems: Consitutional: No fever, chills, fatigue, night sweats, lymphadenopathy, or weight changes. Eyes: No visual changes, eye redness, or discharge. ENT/Mouth: Ears: No otalgia, tinnitus, hearing loss, discharge. Nose: No congestion, rhinorrhea, sinus pain, or epistaxis. Throat: No sore throat, post nasal drip, or teeth pain. Cardiovascular: No CP, palpitations, diaphoresis, DOE, edema, orthopnea, PND. Respiratory: No hemoptysis, or wheezing.  Has chronic cough and mild dyspnea on exertion Gastrointestinal: No anorexia, dysphagia, reflux, pain, nausea, vomiting, hematemesis, diarrhea, constipation, BRBPR, or melena. Genitourinary: No dysuria, frequency, urgency, hematuria, incontinence, nocturia, decreased urinary stream, discharge, impotence, or testicular pain/masses. Musculoskeletal: No decreased ROM, myalgias, stiffness, joint swelling, or weakness. Skin: No rash, erythema, lesion changes, pain, warmth, jaundice, or pruritis. Neurological: No headache, dizziness, syncope, seizures, tremors, memory loss, coordination problems, or paresthesias. Psychological: No anxiety, depression, hallucinations, SI/HI. Endocrine: No fatigue, polydipsia, polyphagia, polyuria, or known diabetes. All other systems were reviewed and are otherwise negative.  Past Medical History  Diagnosis Date  . Hyperlipidemia   . Hypertension   . Anxiety   . Cancer      Past Surgical History    Procedure Laterality Date  . Eye surgery      Home Meds:  Prior to Admission medications   Medication Sig Start Date End Date Taking? Authorizing Provider  albuterol (PROVENTIL HFA;VENTOLIN HFA) 108 (90 BASE) MCG/ACT inhaler Inhale 2 puffs into the lungs every 6 (six) hours as needed for wheezing. 12/16/12  Yes Chelle S Jeffery, PA-C  aspirin 325 MG tablet Take 325 mg by mouth daily.   Yes Historical Provider, MD  clonazePAM (KLONOPIN) 0.5 MG tablet TAKE 1 TABLET BY MOUTH AT BEDTIME AS NEEDED FOR ANXIETY 02/08/13  Yes Marzella Schlein McClung, PA-C  esomeprazole (NEXIUM) 20 MG capsule Take 20 mg by mouth as needed. 05/14/12  Yes Elvina Sidle, MD  ibuprofen (ADVIL,MOTRIN) 800 MG tablet TAKE 1 TABLET BY MOUTH EVERY 8 HOURS AS NEEDED FOR JOINT PAIN 08/28/12  Yes Ryan M Dunn, PA-C  lisinopril (PRINIVIL,ZESTRIL) 10 MG tablet Take 1 tablet (10 mg total) by mouth daily. Needs office visit 05/26/13  Yes Heather M Marte, PA-C  Melaton-Thean-Cham-PassF-LBalm (MELATONIN + L-THEANINE PO) Take by mouth.   Yes Historical Provider, MD  methocarbamol (ROBAXIN) 750 MG tablet TAKE 1 TABLET FOUR TIMES A DAY 02/01/13  Yes Ryan M Dunn, PA-C  niacin-simvastatin [facility]) 500-20 MG 24 hr tablet Take 1 tablet by mouth at bedtime. Needs office visit 05/26/13  Yes Nelva Nay, PA-C  vitamin E 100 UNIT capsule Take 100 Units by mouth daily.   Yes Historical Provider, MD    Allergies:  Allergies  Allergen Reactions  . Celebrex (Celecoxib) Itching    History   Social History  . Marital Status: Married    Spouse Name: N/A    Number of Children: N/A  . Years of Education: N/A   Occupational History  . Not on file.   Social History Main Topics  . Smoking status: Current Every Day Smoker  .  Smokeless tobacco: Not on file  . Alcohol Use: Not on file  . Drug Use: Not on file  . Sexually Active: Not on file   Other Topics Concern  . Not on file   Social History Narrative  . No narrative on file    Family History   Problem Relation Age of Onset  . Heart disease Mother   . Heart disease Father     Physical Exam: Blood pressure 118/71, pulse 76, temperature 97.6 F (36.4 C), temperature source Oral, resp. rate 16, height 6' (1.829 m), weight 282 lb 9.6 oz (128.187 kg), SpO2 96.00%.  General: Well developed, well nourished, in no acute distress. HEENT: Normocephalic, atraumatic. Conjunctiva pink, sclera non-icteric. Pupils 2 mm constricting to 1 mm, round, regular, and equally reactive to light and accomodation. EOMI. Internal auditory canal clear. TMs with good cone of light and without pathology. Nasal mucosa pink. Nares are without discharge. No sinus tenderness. Oral mucosa pink. Dentition  Poor condition-missing molars and marked gingival recession. Pharynx without exudate.   Neck: Supple. Trachea midline. No thyromegaly. Full ROM. No lymphadenopathy. Lungs: Clear to auscultation bilaterally without wheezes, rales, or rhonchi. Breathing is of normal effort and unlabored. Cardiovascular: RRR with S1 S2. No murmurs, rubs, or gallops appreciated. Distal pulses 2+ symmetrically. No carotid or abdominal bruits Abdomen: Soft, non-tender, non-distended with normoactive bowel sounds. No hepatosplenomegaly or masses. No rebound/guarding. No CVA tenderness. Without hernias.  Rectal: No external hemorrhoids or fissures. Rectal vault without masses.  Genitourinary:  circumcised male. No penile lesions.  Mild erythema of glans. Testes descended bilaterally, and smooth without tenderness or masses.  Musculoskeletal: Full range of motion and 5/5 strength throughout. Without swelling, atrophy, tenderness, crepitus, or warmth. Extremities without clubbing, cyanosis, or edema. Calves supple. Skin: Warm and moist without erythema, ecchymosis, wounds, or rash. Neuro: A+Ox3. CN II-XII grossly intact. Moves all extremities spontaneously. Full sensation throughout. Normal gait. DTR 2+ throughout upper and lower extremities.  Finger to nose intact. Psych:  Responds to questions appropriately with a normal affect.   Studies: CBC, CMET, Lipid, PSA [facility] reading (PRIMARY) by  Dr. Milus Glazier:  CXR without infiltrate or obvious abnormality. Results for orders placed in visit on 06/02/13  POCT GLYCOSYLATED HEMOGLOBIN (HGB A1C)      Result Value Range   Hemoglobin A1C 6.0    IFOBT (OCCULT BLOOD)      Result Value Range   IFOBT Negative    POCT URINALYSIS DIPSTICK      Result Value Range   Color, UA YELLOW     Clarity, UA CLEAR     Glucose, UA NEG     Bilirubin, UA NEG     Ketones, UA NEG     Spec Grav, UA 1.020     Blood, UA NEG     pH, UA 5.5     Protein, UA NEG     Urobilinogen, UA 0.2     Nitrite, UA NEG     Leukocytes, UA Negative        Assessment/Plan:  67 y.o. y/o  male here for CPE Need for prophylactic vaccination against Streptococcus pneumoniae (pneumococcus) - Plan: Pneumococcal polysaccharide vaccine 23-valent greater than or equal to 2yo subcutaneous/IM  SOB (shortness of breath) - Plan: DG Chest 2 View  Routine general medical examination at a health care facility - Plan: CBC, Comprehensive metabolic panel, Lipid panel, POCT glycosylated hemoglobin (Hb A1C), IFOBT POC (occult bld, rslt in office), POCT urinalysis dipstick, PSA, Sedimentation Rate, CANCELED: POCT  SEDIMENTATION RATE  Insomnia - Plan: clonazePAM (KLONOPIN) 0.5 MG tablet  Back pain - Plan: methocarbamol (ROBAXIN) 750 MG tablet   - Discussed smoking cessation with patient,  He knows he needs to quit Signed, Elvina Sidle, MD 06/02/2013 2:14 PM

## 2013-06-03 LAB — SEDIMENTATION RATE: Sed Rate: 1 mm/hr (ref 0–16)

## 2013-06-03 LAB — COMPREHENSIVE METABOLIC PANEL
ALT: 15 U/L (ref 0–53)
AST: 16 U/L (ref 0–37)
Albumin: 4.8 g/dL (ref 3.5–5.2)
Alkaline Phosphatase: 66 U/L (ref 39–117)
BUN: 16 mg/dL (ref 6–23)
CO2: 22 mEq/L (ref 19–32)
Calcium: 9.5 mg/dL (ref 8.4–10.5)
Chloride: 106 mEq/L (ref 96–112)
Creat: 1.01 mg/dL (ref 0.50–1.35)
Glucose, Bld: 68 mg/dL — ABNORMAL LOW (ref 70–99)
Potassium: 4.9 mEq/L (ref 3.5–5.3)
Sodium: 138 mEq/L (ref 135–145)
Total Bilirubin: 0.6 mg/dL (ref 0.3–1.2)
Total Protein: 7 g/dL (ref 6.0–8.3)

## 2013-06-03 LAB — LIPID PANEL
Cholesterol: 135 mg/dL (ref 0–200)
HDL: 32 mg/dL — ABNORMAL LOW (ref >39)
LDL Cholesterol: 79 mg/dL (ref 0–99)
Total CHOL/HDL Ratio: 4.2 Ratio
Triglycerides: 121 mg/dL (ref <150)
VLDL: 24 mg/dL (ref 0–40)

## 2013-06-03 LAB — PSA: PSA: 1.63 ng/mL (ref <=4.00)

## 2013-07-14 ENCOUNTER — Other Ambulatory Visit: Payer: Self-pay | Admitting: Physician Assistant

## 2013-07-24 ENCOUNTER — Other Ambulatory Visit: Payer: Self-pay | Admitting: Physician Assistant

## 2013-08-12 ENCOUNTER — Other Ambulatory Visit: Payer: Self-pay | Admitting: Physician Assistant

## 2013-10-15 ENCOUNTER — Other Ambulatory Visit: Payer: Self-pay | Admitting: Family Medicine

## 2013-11-18 ENCOUNTER — Other Ambulatory Visit: Payer: Self-pay | Admitting: Family Medicine

## 2013-11-22 ENCOUNTER — Emergency Department (HOSPITAL_COMMUNITY): Payer: Medicare Other

## 2013-11-22 ENCOUNTER — Emergency Department (HOSPITAL_COMMUNITY)
Admission: EM | Admit: 2013-11-22 | Discharge: 2013-11-22 | Disposition: A | Discharge status: Home / Self Care | Payer: Medicare Other | Attending: Emergency Medicine | Admitting: Emergency Medicine

## 2013-11-22 ENCOUNTER — Encounter (HOSPITAL_COMMUNITY): Payer: Self-pay | Admitting: Emergency Medicine

## 2013-11-22 DIAGNOSIS — S42213A Unspecified displaced fracture of surgical neck of unspecified humerus, initial encounter for closed fracture: Secondary | ICD-10-CM | POA: Insufficient documentation

## 2013-11-22 DIAGNOSIS — Z85828 Personal history of other malignant neoplasm of skin: Secondary | ICD-10-CM | POA: Diagnosis not present

## 2013-11-22 DIAGNOSIS — R209 Unspecified disturbances of skin sensation: Secondary | ICD-10-CM | POA: Diagnosis not present

## 2013-11-22 DIAGNOSIS — S4980XA Other specified injuries of shoulder and upper arm, unspecified arm, initial encounter: Secondary | ICD-10-CM | POA: Diagnosis present

## 2013-11-22 DIAGNOSIS — I10 Essential (primary) hypertension: Secondary | ICD-10-CM | POA: Diagnosis not present

## 2013-11-22 DIAGNOSIS — R110 Nausea: Secondary | ICD-10-CM | POA: Diagnosis not present

## 2013-11-22 DIAGNOSIS — Y929 Unspecified place or not applicable: Secondary | ICD-10-CM | POA: Diagnosis not present

## 2013-11-22 DIAGNOSIS — S42301A Unspecified fracture of shaft of humerus, right arm, initial encounter for closed fracture: Secondary | ICD-10-CM

## 2013-11-22 DIAGNOSIS — Z79899 Other long term (current) drug therapy: Secondary | ICD-10-CM | POA: Insufficient documentation

## 2013-11-22 DIAGNOSIS — Y939 Activity, unspecified: Secondary | ICD-10-CM | POA: Insufficient documentation

## 2013-11-22 DIAGNOSIS — E785 Hyperlipidemia, unspecified: Secondary | ICD-10-CM | POA: Diagnosis not present

## 2013-11-22 DIAGNOSIS — S52509A Unspecified fracture of the lower end of unspecified radius, initial encounter for closed fracture: Secondary | ICD-10-CM | POA: Insufficient documentation

## 2013-11-22 DIAGNOSIS — W11XXXA Fall on and from ladder, initial encounter: Secondary | ICD-10-CM | POA: Insufficient documentation

## 2013-11-22 DIAGNOSIS — S42293A Other displaced fracture of upper end of unspecified humerus, initial encounter for closed fracture: Secondary | ICD-10-CM | POA: Insufficient documentation

## 2013-11-22 DIAGNOSIS — F411 Generalized anxiety disorder: Secondary | ICD-10-CM | POA: Diagnosis not present

## 2013-11-22 DIAGNOSIS — S4291XA Fracture of right shoulder girdle, part unspecified, initial encounter for closed fracture: Secondary | ICD-10-CM

## 2013-11-22 DIAGNOSIS — F17200 Nicotine dependence, unspecified, uncomplicated: Secondary | ICD-10-CM | POA: Diagnosis not present

## 2013-11-22 DIAGNOSIS — Z8619 Personal history of other infectious and parasitic diseases: Secondary | ICD-10-CM | POA: Diagnosis not present

## 2013-11-22 DIAGNOSIS — Z7982 Long term (current) use of aspirin: Secondary | ICD-10-CM | POA: Insufficient documentation

## 2013-11-22 DIAGNOSIS — Z888 Allergy status to other drugs, medicaments and biological substances status: Secondary | ICD-10-CM | POA: Insufficient documentation

## 2013-11-22 DIAGNOSIS — S62101A Fracture of unspecified carpal bone, right wrist, initial encounter for closed fracture: Secondary | ICD-10-CM

## 2013-11-22 MED ORDER — OXYCODONE-ACETAMINOPHEN 5-325 MG PO TABS
1.0000 | ORAL_TABLET | Freq: Once | ORAL | Status: AC
  Administered 2013-11-22: 1 via ORAL
  Filled 2013-11-22: qty 1

## 2013-11-22 MED ORDER — HYDROMORPHONE HCL PF 1 MG/ML IJ SOLN: 1.0000 mg | Freq: Once | INTRAMUSCULAR | Status: DC

## 2013-11-22 MED ORDER — HYDROMORPHONE HCL PF 1 MG/ML IJ SOLN
1.0000 mg | Freq: Once | INTRAMUSCULAR | Status: DC
  Filled 2013-11-22: qty 1

## 2013-11-22 MED ORDER — OXYCODONE-ACETAMINOPHEN 10-325 MG PO TABS: 1.0000 | ORAL_TABLET | ORAL | Status: DC | PRN

## 2013-11-22 MED ORDER — HYDROMORPHONE HCL PF 1 MG/ML IJ SOLN
1.0000 mg | Freq: Once | INTRAMUSCULAR | Status: AC
  Administered 2013-11-22: 1 mg via INTRAVENOUS
  Filled 2013-11-22: qty 1

## 2013-11-22 MED ORDER — ONDANSETRON HCL 4 MG/2ML IJ SOLN
4.0000 mg | Freq: Once | INTRAMUSCULAR | Status: AC
  Administered 2013-11-22: 4 mg via INTRAVENOUS
  Filled 2013-11-22: qty 2

## 2013-11-22 NOTE — ED Notes (Signed)
MD at bedside speaking with family 

## 2013-11-22 NOTE — ED Provider Notes (Signed)
CSN: [id number]     Arrival date & time 11/22/13  1458 History   None    Chief Complaint  Patient presents with  . Fall  . Shoulder Injury  . Wrist Injury   (Consider location/radiation/quality/duration/timing/severity/associated sxs/prior Treatment) HPI Christian [name] is a 67 y.o. male w/ PMHx of HTN, HLD, Anxiety, presents to the ED after a fall from a ladder from about 5-10 feet high. The patient claims he fell directly onto his right shoulder and wrist, resulting in the right arm being under his back when he landed on the ground. The patient denies any LOC. Wife called EMS, when EMS arrived, patient still with arm under him. Gave 100 mcg Fentanyl and moved arm to rest on abdomen. Patient w/ pain and numbness in the shoulder and pain in the wrist. No other issues at this time. No chest pain, SOB, abdominal pain. The patient also denies any back pain or neck pain. No loss of ROM in the LE's.   Past Medical History  Diagnosis Date  . Hyperlipidemia   . Hypertension   . Anxiety   . Cancer     skin   Past Surgical History  Procedure Laterality Date  . Eye surgery     Family History  Problem Relation Age of Onset  . Heart disease Mother   . Heart disease Father    History  Substance Use Topics  . Smoking status: Current Every Day Smoker -- 1.00 packs/day    Types: Cigarettes  . Smokeless tobacco: Not on file  . Alcohol Use: Yes     Comment: 1 glass of wine per night    Review of Systems General: Denies fever, chills, diaphoresis, appetite change and fatigue.  Respiratory: Denies SOB, DOE, cough, chest tightness, and wheezing.   Cardiovascular: Denies chest pain, palpitations and leg swelling.  Gastrointestinal: Positive for mild nausea. Denies vomiting, abdominal pain, diarrhea, constipation, blood in stool and abdominal distention.  Genitourinary: Denies dysuria, urgency, frequency, hematuria, flank pain and difficulty urinating.  Endocrine: Denies hot or cold  intolerance, sweats, polyuria, polydipsia. Musculoskeletal: Positive for right shoulder/wrist pain. Denies back pain, joint swelling, and gait problem.  Skin: Denies pallor, rash and wounds.  Neurological: Positive for right shoulder numbness. Denies dizziness, seizures, syncope, weakness, lightheadedness, and headaches.  Psychiatric/Behavioral: Denies mood changes, confusion, nervousness, sleep disturbance and agitation.  Allergies  Celebrex  Home Medications   Current Outpatient Rx  Name  Route  Sig  Dispense  Refill  . aspirin 325 MG tablet   Oral   Take 325 mg by mouth daily.         . clonazePAM (KLONOPIN) 0.5 MG tablet      TAKE 1 TABLET BY MOUTH AT BEDTIME AS NEEDED FOR ANXIETY   90 tablet   1   . lisinopril (PRINIVIL,ZESTRIL) 10 MG tablet   Oral   Take 1 tablet (10 mg total) by mouth daily. PATIENT NEEDS OFFICE VISIT FOR ADDITIONAL REFILLS   30 tablet   0   . Melaton-Thean-Cham-PassF-LBalm (MELATONIN + L-THEANINE PO)   Oral   Take 1 tablet by mouth at bedtime as needed (sleep).          . methocarbamol (ROBAXIN) 750 MG tablet   Oral   Take 1 tablet (750 mg total) by mouth 4 (four) times daily as needed.   180 tablet   3     Needs office visit   . metoprolol succinate (TOPROL-XL) 25 MG 24 hr tablet  Oral   Take 25 mg by mouth daily.         . simvastatin (ZOCOR) 40 MG tablet   Oral   Take 40 mg by mouth at bedtime.         . vitamin E 100 UNIT capsule   Oral   Take 100 Units by mouth daily.          Physical Exam Filed Vitals:   11/22/13 1517 11/22/13 1526 11/22/13 1530  BP:  145/62 148/68  Pulse:   61  Temp:  97.4 F (36.3 C)   TempSrc:  Oral   Resp:  18   Height:  6' (1.829 m)   Weight:  290 lb (131.543 kg)   SpO2: 95% 98% 100%   General: Vital signs reviewed.  Patient is a well-developed and well-nourished, in no acute distress and cooperative with exam. Alert and oriented x3.  Head: Normocephalic and atraumatic. Eyes: PERRL,  EOMI, conjunctivae normal, No scleral icterus.  Neck: Supple, trachea midline, normal ROM, No JVD, masses, thyromegaly, or carotid bruit present.  Cardiovascular: RRR, S1 normal, S2 normal, no murmurs, gallops, or rubs. Pulmonary/Chest: Normal respiratory effort, CTAB, no wheezes, rales, or rhonchi. Abdominal: Soft, non-tender, non-distended, bowel sounds are normal, no masses, organomegaly, or guarding present.  Musculoskeletal: Right shoulder asymmetry and pain w/ crepitus. Right wrist deformity w/ pain. Pulses intact. RIght arm ROM restricted. Able to move right hand, sensation and distal pulses intact. Swelling in right extremity.  Extremities: Pulses symmetric and intact bilaterally. No cyanosis or clubbing. Neurological: A&O x3, cranial nerve II-XII are grossly intact, ROM restricted in right arm, otherwise, no focal motor deficit, sensory intact to light touch bilaterally.  Skin: Warm, dry and intact. No rashes or erythema. Psychiatric: Normal mood and affect. speech and behavior is normal. Cognition and memory are normal.   ED Course  Procedures (including critical care time) Labs Review Labs Reviewed - No data to display Imaging Review Dg Shoulder Right  11/22/2013   CLINICAL DATA:  Larey Seat.  Injured right shoulder.  EXAM: RIGHT SHOULDER - 2+ VIEW  COMPARISON:  None.  FINDINGS: There is a complex multipart fracture of the humeral head and neck. No dislocation. The [facility] joint is intact.  IMPRESSION: Complex multipart right humeral head/ neck fracture.   Electronically Signed   By: Loralie Champagne M.D.   On: 11/22/2013 16:52   Dg Wrist Complete Right  11/22/2013   CLINICAL DATA:  Fall  EXAM: RIGHT WRIST - COMPLETE 3+ VIEW  COMPARISON:  None.  FINDINGS: Three views of the right wrist submitted. There is displaced comminuted fracture of distal right radius. Displaced fracture of the ulnar styloid. Displacement of distal ulna with disruption of the wrist joint.  IMPRESSION: Displaced comminuted  fracture of distal right radius. Displaced fracture of the ulnar styloid. Displacement of distal ulna with disruption of wrist joint.   Electronically Signed   By: Natasha Mead M.D.   On: 11/22/2013 16:45    EKG Interpretation   None      MDM   Christian [name] is a 67 y.o. male w/ PMHx of HTN, HLD, Anxiety, presents to the ED after a fall from ladder. Fell directly on right shoulder w/ obvious ROM restriction and pain in right arm. Crepitus in right shoulder, also w/ right wrist deformity. Otherwise, no signs of rib fractures or other trauma.  -XR R shoulder shows complex multipart right humeral head/ neck fracture. -XR R wrist shows displaced fracture of the  ulnar styloid w/ displacement of distal ulna with disruption of wrist joint. -Dilaudid 1 mg for pain given + percocet 5-325 w/ splint placement. -Zofran 4 mg for nausea.  Discussed w/ orthopedics, Dr. Lajoyce Corners saw patient, feels that surgical intervention is necessary for wrist fracture, shoulder does not need repair. Will give patient sugar tong splint for right wrist w/ shoulder immobilizer and Dr. Audrie Lia office will call patient tomorrow to schedule surgery. Okay for discharge home, given Vicodin 10-325 #30 for pain.  Courtney Paris, MD 11/22/13 [phone]

## 2013-11-22 NOTE — ED Notes (Signed)
Per EMS Pt had a fall at home from a ladder at approximately 5-10 feet. Denies LOC. Pt had obvious deformity to right shoulder and right wrist. Pt also had bruising to left chest. Lung sounds clear and equal bilaterally. Pt able to wiggle fingers but reports decreased sensation to right upper extremity. Pt denies head/neck/back pain. EMS gave 100 mcg of fentanyl. Pt is AAOx4 on arrival. NAD noted. Fentanyl has improved pain.

## 2013-11-22 NOTE — Progress Notes (Signed)
Orthopedic Tech Progress Note Patient Details:  Christian [name] [birth year] [id number]  Patient ID: [name], male   DOB: 1946-05-30, 67 y.o.   MRN: [id number] Viewed order from doctor's order list  Nikki Dom 11/22/2013, 7:11 PM

## 2013-11-22 NOTE — Progress Notes (Signed)
Orthopedic Tech Progress Note Patient Details:  Christian [name] 1946/01/22 [id number]  Ortho Devices Type of Ortho Device: Ace wrap;Arm sling;Sugartong splint Ortho Device/Splint Location: rue Ortho Device/Splint Interventions: Application   [name] 11/22/2013, 7:10 PM

## 2013-11-23 ENCOUNTER — Other Ambulatory Visit (HOSPITAL_COMMUNITY): Payer: Self-pay | Admitting: Orthopedic Surgery

## 2013-11-23 ENCOUNTER — Encounter (HOSPITAL_COMMUNITY): Payer: Self-pay | Admitting: Pharmacy Technician

## 2013-11-24 NOTE — Pre-Procedure Instructions (Signed)
JEHAD BISONO  11/24/2013   Your procedure is scheduled on:  Friday, Dec. 19th.             (Free Valet Parking is available)   Report to [facility]  2 * 3 at  11:15  AM.  Call this number if you have problems the morning of surgery: [phone]   Remember:   Do not eat food or drink liquids after midnight Thursday.   Take these medicines the morning of surgery with A SIP OF WATER: Metoprolol, Oxycodone, Methocarbamol (if needed)   Do not wear jewelry.  Do not wear lotions, powders, or colognes. You may NOT wear deodorant.   Men may shave face and neck.   Do not bring valuables to the hospital.  [facility] is not responsible for any belongings or valuables.               Contacts, dentures or bridgework may not be worn into surgery.  Leave suitcase in the car. After surgery it may be brought to your room.  For patients admitted to the hospital, discharge time is determined by your treatment team.             Name and phone number of your driver:  IBROHIM SIMMERS     [phone]   Special Instructions: Shower using CHG 2 nights before surgery and the night before surgery.  If you shower the day of surgery use CHG.  Use special wash - you have one bottle of CHG for all showers.  You should use approximately 1/3 of the bottle for each shower.   Please read over the following fact sheets that you were given: Pain Booklet, Coughing and Deep Breathing, MRSA Information and Surgical Site Infection Prevention

## 2013-11-25 ENCOUNTER — Encounter (HOSPITAL_COMMUNITY)
Admission: RE | Admit: 2013-11-25 | Discharge: 2013-11-25 | Disposition: A | Discharge status: Home / Self Care | Payer: Medicare Other | Source: Ambulatory Visit | Attending: Orthopedic Surgery | Admitting: Orthopedic Surgery

## 2013-11-25 ENCOUNTER — Encounter (HOSPITAL_COMMUNITY): Payer: Self-pay

## 2013-11-25 HISTORY — DX: Personal history of other infectious and parasitic diseases: Z86.19

## 2013-11-25 LAB — PROTIME-INR
INR: 1.03 (ref 0.00–1.49)
Prothrombin Time: 13.3 seconds (ref 11.6–15.2)

## 2013-11-25 LAB — CBC
HCT: 40.3 % (ref 39.0–52.0)
Hemoglobin: 14.1 g/dL (ref 13.0–17.0)
MCH: 35.9 pg — ABNORMAL HIGH (ref 26.0–34.0)
MCHC: 35 g/dL (ref 30.0–36.0)
MCV: 102.5 fL — ABNORMAL HIGH (ref 78.0–100.0)
Platelets: 164 10*3/uL (ref 150–400)
RBC: 3.93 MIL/uL — ABNORMAL LOW (ref 4.22–5.81)
RDW: 13.3 % (ref 11.5–15.5)
WBC: 12.9 10*3/uL — ABNORMAL HIGH (ref 4.0–10.5)

## 2013-11-25 LAB — COMPREHENSIVE METABOLIC PANEL
ALT: 12 U/L (ref 0–53)
AST: 15 U/L (ref 0–37)
Albumin: 3.8 g/dL (ref 3.5–5.2)
Alkaline Phosphatase: 64 U/L (ref 39–117)
BUN: 16 mg/dL (ref 6–23)
CO2: 22 mEq/L (ref 19–32)
Calcium: 9.3 mg/dL (ref 8.4–10.5)
Chloride: 98 mEq/L (ref 96–112)
Creatinine, Ser: 0.97 mg/dL (ref 0.50–1.35)
GFR calc Af Amer: >90 mL/min (ref >90)
GFR calc non Af Amer: 85 mL/min — ABNORMAL LOW (ref >90)
Glucose, Bld: 111 mg/dL — ABNORMAL HIGH (ref 70–99)
Potassium: 4.3 mEq/L (ref 3.5–5.1)
Sodium: 132 mEq/L — ABNORMAL LOW (ref 135–145)
Total Bilirubin: 0.6 mg/dL (ref 0.3–1.2)
Total Protein: 7.3 g/dL (ref 6.0–8.3)

## 2013-11-25 LAB — APTT: aPTT: 34 seconds (ref 24–37)

## 2013-11-25 MED ORDER — CEFAZOLIN SODIUM-DEXTROSE 2-3 GM-% IV SOLR
2.0000 g | INTRAVENOUS | Status: AC
  Administered 2013-11-26: 2 g via INTRAVENOUS
  Filled 2013-11-25: qty 50

## 2013-11-25 NOTE — ED Provider Notes (Signed)
I saw and evaluated the patient, reviewed the resident's note and I agree with the findings and plan.   .Face to face Exam:  General:  Awake HEENT:  Atraumatic Resp:  Normal effort Abd:  Nondistended Neuro:No focal weakness Lymph: No adenopathy  Nelia Shi, MD 11/25/13 1346

## 2013-11-25 NOTE — Progress Notes (Addendum)
2010 "had a lot of pressure" went to [facility] d/t "pressure" .  Cardiac Cath done..he had some collateral circulation to compensate (Dr. Deno Etienne was cardio)  (Will call [facility] for that record). Last time he saw Deno Etienne was 1 yr ago.  Having no problems now.  DA Had sleep study done 10-12 yrs ago, and wears cpap.  Doesn't know settings.  DA Called [phone] -they will fax [place] and & all cardiac notes & tests---DA

## 2013-11-26 ENCOUNTER — Encounter (HOSPITAL_COMMUNITY)
Admission: RE | Disposition: A | Discharge status: Home / Self Care | Payer: Self-pay | Source: Ambulatory Visit | Attending: Orthopedic Surgery

## 2013-11-26 ENCOUNTER — Ambulatory Visit (HOSPITAL_COMMUNITY): Payer: Medicare Other | Admitting: Anesthesiology

## 2013-11-26 ENCOUNTER — Encounter (HOSPITAL_COMMUNITY): Payer: Self-pay | Admitting: Certified Registered Nurse Anesthetist

## 2013-11-26 ENCOUNTER — Ambulatory Visit (HOSPITAL_COMMUNITY)
Admission: RE | Admit: 2013-11-26 | Discharge: 2013-11-26 | Disposition: A | Discharge status: Home / Self Care | Payer: Medicare Other | Source: Ambulatory Visit | Attending: Orthopedic Surgery | Admitting: Orthopedic Surgery

## 2013-11-26 ENCOUNTER — Encounter (HOSPITAL_COMMUNITY): Payer: Medicare Other | Admitting: Anesthesiology

## 2013-11-26 DIAGNOSIS — I10 Essential (primary) hypertension: Secondary | ICD-10-CM | POA: Insufficient documentation

## 2013-11-26 DIAGNOSIS — Z01810 Encounter for preprocedural cardiovascular examination: Secondary | ICD-10-CM | POA: Insufficient documentation

## 2013-11-26 DIAGNOSIS — F17200 Nicotine dependence, unspecified, uncomplicated: Secondary | ICD-10-CM | POA: Insufficient documentation

## 2013-11-26 DIAGNOSIS — S52599A Other fractures of lower end of unspecified radius, initial encounter for closed fracture: Secondary | ICD-10-CM | POA: Insufficient documentation

## 2013-11-26 DIAGNOSIS — Z01812 Encounter for preprocedural laboratory examination: Secondary | ICD-10-CM | POA: Insufficient documentation

## 2013-11-26 DIAGNOSIS — W11XXXA Fall on and from ladder, initial encounter: Secondary | ICD-10-CM | POA: Insufficient documentation

## 2013-11-26 DIAGNOSIS — S52531A Colles' fracture of right radius, initial encounter for closed fracture: Secondary | ICD-10-CM

## 2013-11-26 HISTORY — PX: OPEN REDUCTION INTERNAL FIXATION (ORIF) DISTAL RADIAL FRACTURE: SHX5989

## 2013-11-26 SURGERY — OPEN REDUCTION INTERNAL FIXATION (ORIF) DISTAL RADIUS FRACTURE
Anesthesia: General | Site: Wrist | Laterality: Right

## 2013-11-26 MED ORDER — BUPIVACAINE-EPINEPHRINE PF 0.5-1:200000 % IJ SOLN
INTRAMUSCULAR | Status: DC | PRN
  Administered 2013-11-26: 28 mL via PERINEURAL

## 2013-11-26 MED ORDER — PHENYLEPHRINE HCL 10 MG/ML IJ SOLN
INTRAMUSCULAR | Status: DC | PRN
  Administered 2013-11-26 (×2): 40 ug via INTRAVENOUS
  Administered 2013-11-26: 80 ug via INTRAVENOUS
  Administered 2013-11-26: 40 ug via INTRAVENOUS

## 2013-11-26 MED ORDER — FENTANYL CITRATE 0.05 MG/ML IJ SOLN
INTRAMUSCULAR | Status: AC
  Administered 2013-11-26: 100 ug
  Filled 2013-11-26: qty 2

## 2013-11-26 MED ORDER — MIDAZOLAM HCL 2 MG/2ML IJ SOLN
INTRAMUSCULAR | Status: AC
  Administered 2013-11-26: 2 mg
  Filled 2013-11-26: qty 2

## 2013-11-26 MED ORDER — 0.9 % SODIUM CHLORIDE (POUR BTL) OPTIME
TOPICAL | Status: DC | PRN
  Administered 2013-11-26: 1000 mL

## 2013-11-26 MED ORDER — OXYCODONE HCL 5 MG PO TABS: 5.0000 mg | ORAL_TABLET | Freq: Once | ORAL | Status: DC | PRN

## 2013-11-26 MED ORDER — PROPOFOL 10 MG/ML IV BOLUS: INTRAVENOUS | Status: DC | PRN

## 2013-11-26 MED ORDER — PROPOFOL 10 MG/ML IV BOLUS
INTRAVENOUS | Status: DC | PRN
  Administered 2013-11-26: 170 mg via INTRAVENOUS

## 2013-11-26 MED ORDER — ONDANSETRON HCL 4 MG/2ML IJ SOLN: 4.0000 mg | Freq: Once | INTRAMUSCULAR | Status: DC | PRN

## 2013-11-26 MED ORDER — ALBUTEROL SULFATE (5 MG/ML) 0.5% IN NEBU
INHALATION_SOLUTION | RESPIRATORY_TRACT | Status: AC
  Filled 2013-11-26: qty 0.5

## 2013-11-26 MED ORDER — LIDOCAINE HCL (CARDIAC) 20 MG/ML IV SOLN
INTRAVENOUS | Status: DC | PRN
  Administered 2013-11-26: 80 mg via INTRAVENOUS

## 2013-11-26 MED ORDER — ONDANSETRON HCL 4 MG/2ML IJ SOLN
INTRAMUSCULAR | Status: DC | PRN
  Administered 2013-11-26: 4 mg via INTRAVENOUS

## 2013-11-26 MED ORDER — HYDROCODONE-ACETAMINOPHEN 5-325 MG PO TABS: 1.0000 | ORAL_TABLET | Freq: Four times a day (QID) | ORAL | Status: DC | PRN

## 2013-11-26 MED ORDER — LACTATED RINGERS IV SOLN
INTRAVENOUS | Status: DC
  Administered 2013-11-26: 12:00:00 via INTRAVENOUS

## 2013-11-26 MED ORDER — HYDROMORPHONE HCL PF 1 MG/ML IJ SOLN: 0.2500 mg | INTRAMUSCULAR | Status: DC | PRN

## 2013-11-26 MED ORDER — LACTATED RINGERS IV SOLN
INTRAVENOUS | Status: DC | PRN
  Administered 2013-11-26: 13:00:00 via INTRAVENOUS

## 2013-11-26 MED ORDER — PHENYLEPHRINE HCL 10 MG/ML IJ SOLN
10.0000 mg | INTRAVENOUS | Status: DC | PRN
  Administered 2013-11-26: 25 ug/min via INTRAVENOUS

## 2013-11-26 MED ORDER — OXYCODONE HCL 5 MG/5ML PO SOLN: 5.0000 mg | Freq: Once | ORAL | Status: DC | PRN

## 2013-11-26 SURGICAL SUPPLY — 65 items
BANDAGE ELASTIC 4 VELCRO ST LF (GAUZE/BANDAGES/DRESSINGS) ×1 IMPLANT
BANDAGE GAUZE ELAST BULKY 4 IN (GAUZE/BANDAGES/DRESSINGS) ×1 IMPLANT
BIT DRILL 2.2 SS TIBIAL (BIT) ×2 IMPLANT
BNDG CMPR 9X4 STRL LF SNTH (GAUZE/BANDAGES/DRESSINGS) ×1
BNDG COHESIVE 4X5 TAN STRL (GAUZE/BANDAGES/DRESSINGS) ×2 IMPLANT
BNDG ESMARK 4X9 LF (GAUZE/BANDAGES/DRESSINGS) ×2 IMPLANT
CLOTH BEACON ORANGE TIMEOUT ST (SAFETY) ×2 IMPLANT
COVER SURGICAL LIGHT HANDLE (MISCELLANEOUS) ×2 IMPLANT
CUFF TOURNIQUET SINGLE 18IN (TOURNIQUET CUFF) ×1 IMPLANT
CUFF TOURNIQUET SINGLE 24IN (TOURNIQUET CUFF) IMPLANT
DRAPE INCISE IOBAN 66X45 STRL (DRAPES) IMPLANT
DRAPE OEC MINIVIEW 54X84 (DRAPES) ×2 IMPLANT
DRAPE U-SHAPE 47X51 STRL (DRAPES) ×2 IMPLANT
DRSG ADAPTIC 3X8 NADH LF (GAUZE/BANDAGES/DRESSINGS) ×1 IMPLANT
DRSG EMULSION OIL 3X3 NADH (GAUZE/BANDAGES/DRESSINGS) IMPLANT
DRSG PAD ABDOMINAL 8X10 ST (GAUZE/BANDAGES/DRESSINGS) IMPLANT
DURAPREP 26ML APPLICATOR (WOUND CARE) ×2 IMPLANT
ELECT REM PT RETURN 9FT ADLT (ELECTROSURGICAL) ×2
ELECTRODE REM PT RTRN 9FT ADLT (ELECTROSURGICAL) ×1 IMPLANT
GAUZE SPONGE 4X4 16PLY XRAY LF (GAUZE/BANDAGES/DRESSINGS) ×2 IMPLANT
GLOVE BIOGEL PI IND STRL 9 (GLOVE) ×1 IMPLANT
GLOVE BIOGEL PI INDICATOR 9 (GLOVE) ×1
GLOVE SURG ORTHO 9.0 STRL STRW (GLOVE) ×2 IMPLANT
GOWN SRG XL XLNG 56XLVL 4 (GOWN DISPOSABLE) ×2 IMPLANT
GOWN STRL NON-REIN XL XLG LVL4 (GOWN DISPOSABLE) ×4
K-WIRE 1.6 (WIRE) ×4
K-WIRE FX5X1.6XNS BN SS (WIRE) ×2
KIT BASIN OR (CUSTOM PROCEDURE TRAY) ×2 IMPLANT
KIT ROOM TURNOVER OR (KITS) ×2 IMPLANT
KWIRE FX5X1.6XNS BN SS (WIRE) IMPLANT
MANIFOLD NEPTUNE II (INSTRUMENTS) ×2 IMPLANT
NS IRRIG 1000ML POUR BTL (IV SOLUTION) ×2 IMPLANT
PACK ORTHO EXTREMITY (CUSTOM PROCEDURE TRAY) ×2 IMPLANT
PAD ABD 8X10 STRL (GAUZE/BANDAGES/DRESSINGS) ×1 IMPLANT
PAD ARMBOARD 7.5X6 YLW CONV (MISCELLANEOUS) ×4 IMPLANT
PAD CAST 4YDX4 CTTN HI CHSV (CAST SUPPLIES) IMPLANT
PADDING CAST COTTON 4X4 STRL (CAST SUPPLIES) ×2
PEG LOCKING SMOOTH 2.2X20 (Screw) ×2 IMPLANT
PEG LOCKING SMOOTH 2.2X24 (Peg) ×2 IMPLANT
PLATE DVR CROSSLOCK STD RT (Plate) ×1 IMPLANT
SCREW  LP NL 2.7X16MM (Screw) ×2 IMPLANT
SCREW LOCK 16X2.7X 3 LD TPR (Screw) IMPLANT
SCREW LOCK 18X2.7X 3 LD TPR (Screw) IMPLANT
SCREW LOCK 20X2.7X 3 LD TPR (Screw) IMPLANT
SCREW LOCKING 2.7X16 (Screw) ×8 IMPLANT
SCREW LOCKING 2.7X18 (Screw) ×2 IMPLANT
SCREW LOCKING 2.7X20MM (Screw) ×2 IMPLANT
SCREW LP NL 2.7X16MM (Screw) IMPLANT
SCREW NONLOCK 2.7X18MM (Screw) ×1 IMPLANT
SPONGE GAUZE 4X4 12PLY (GAUZE/BANDAGES/DRESSINGS) ×1 IMPLANT
SPONGE LAP 18X18 X RAY DECT (DISPOSABLE) ×4 IMPLANT
STAPLER VISISTAT 35W (STAPLE) ×2 IMPLANT
STRIP CLOSURE SKIN 1/2X4 (GAUZE/BANDAGES/DRESSINGS) ×2 IMPLANT
SUCTION FRAZIER TIP 10 FR DISP (SUCTIONS) ×2 IMPLANT
SUT ETHILON 3 0 PS 1 (SUTURE) ×2 IMPLANT
SUT PROLENE 3 0 PS 1 (SUTURE) ×2 IMPLANT
SUT VIC AB 2-0 CTB1 (SUTURE) ×1 IMPLANT
SUT VIC AB 3-0 X1 27 (SUTURE) ×2 IMPLANT
SUT VICRYL 4-0 PS2 18IN ABS (SUTURE) IMPLANT
SYR BULB 3OZ (MISCELLANEOUS) ×2 IMPLANT
TOWEL OR 17X24 6PK STRL BLUE (TOWEL DISPOSABLE) ×2 IMPLANT
TOWEL OR 17X26 10 PK STRL BLUE (TOWEL DISPOSABLE) ×2 IMPLANT
TUBE CONNECTING 12X1/4 (SUCTIONS) ×2 IMPLANT
WATER STERILE IRR 1000ML POUR (IV SOLUTION) ×2 IMPLANT
YANKAUER SUCT BULB TIP NO VENT (SUCTIONS) ×2 IMPLANT

## 2013-11-26 NOTE — Anesthesia Procedure Notes (Addendum)
Anesthesia Regional Block:  Supraclavicular block  Pre-Anesthetic Checklist: ,, timeout performed, Correct Patient, Correct Site, Correct Laterality, Correct Procedure, Correct Position, site marked, Risks and benefits discussed,  Surgical consent,  Pre-op evaluation,  At surgeon's request and post-op pain management  Laterality: Right and Upper  Prep: chloraprep       Needles:  Injection technique: Single-shot  Needle Type: Echogenic Stimulator Needle     Needle Length: 5cm 5 cm Needle Gauge: 21 and 21 G    Additional Needles:  Procedures: ultrasound guided (picture in chart) Supraclavicular block Narrative:  Start time: 11/26/2013 11:50 AM End time: 11/26/2013 12:00 PM Injection made incrementally with aspirations every 5 mL.  Performed by: Personally  Anesthesiologist: Sheldon Silvan

## 2013-11-26 NOTE — H&P (Signed)
Christian [name] is an 67 y.o. male.   Chief Complaint: Closed right distal radius fracture HPI: Patient is a 67 year old gentleman who sustained a fall with right rib fractures right proximal humerus fracture and a right distal radius fracture  Past Medical History  Diagnosis Date  . Hyperlipidemia   . Hypertension   . Anxiety   . Cancer     skin  . History of shingles     2004    Past Surgical History  Procedure Laterality Date  . Cardiac catheterization      2010 @ [facility] (per pt)  . Eye surgery Bilateral     cataracts    Family History  Problem Relation Age of Onset  . Heart disease Mother   . Heart disease Father    Social History:  reports that he has been smoking Cigarettes.  He has a 40 pack-year smoking history. He does not have any smokeless tobacco history on file. He reports that he drinks alcohol. He reports that he does not use illicit drugs.  Allergies:  Allergies  Allergen Reactions  . Celebrex [Celecoxib] Itching    No prescriptions prior to admission    Results for orders placed during the hospital encounter of 11/25/13 (from the past 48 hour(s))  APTT     Status: None   Collection Time    11/25/13  1:30 PM      Result Value Range   aPTT 34  24 - 37 seconds  CBC     Status: Abnormal   Collection Time    11/25/13  1:30 PM      Result Value Range   WBC 12.9 (*) 4.0 - 10.5 K/uL   RBC 3.93 (*) 4.22 - 5.81 MIL/uL   Hemoglobin 14.1  13.0 - 17.0 g/dL   HCT 16.1  09.6 - 04.5 %   MCV 102.5 (*) 78.0 - 100.0 fL   MCH 35.9 (*) 26.0 - 34.0 pg   MCHC 35.0  30.0 - 36.0 g/dL   RDW 40.9  81.1 - 91.4 %   Platelets 164  150 - 400 K/uL  COMPREHENSIVE METABOLIC PANEL     Status: Abnormal   Collection Time    11/25/13  1:30 PM      Result Value Range   Sodium 132 (*) 135 - 145 mEq/L   Potassium 4.3  3.5 - 5.1 mEq/L   Chloride 98  96 - 112 mEq/L   CO2 22  19 - 32 mEq/L   Glucose, Bld 111 (*) 70 - 99 mg/dL   BUN 16  6 - 23 mg/dL   Creatinine, Ser  7.82  0.50 - 1.35 mg/dL   Calcium 9.3  8.4 - 95.6 mg/dL   Total Protein 7.3  6.0 - 8.3 g/dL   Albumin 3.8  3.5 - 5.2 g/dL   AST 15  0 - 37 U/L   ALT 12  0 - 53 U/L   Alkaline Phosphatase 64  39 - 117 U/L   Total Bilirubin 0.6  0.3 - 1.2 mg/dL   GFR calc non Af Amer 85 (*) >90 mL/min   GFR calc Af Amer >90  >90 mL/min   Comment: (NOTE)     The eGFR has been calculated using the CKD EPI equation.     This calculation has not been validated in all clinical situations.     eGFR's persistently <90 mL/min signify possible Chronic Kidney     Disease.  PROTIME-INR  Status: None   Collection Time    11/25/13  1:30 PM      Result Value Range   Prothrombin Time 13.3  11.6 - 15.2 seconds   INR 1.03  0.00 - 1.49   No results found.  Review of Systems  All other systems reviewed and are negative.    There were no vitals taken for this visit. Physical Exam  On examination there is no blisters no ulcers his hand is grossly neurovascularly intact. Radiographs shows a intra-articular fracture of the right distal radius Assessment/Plan Assessment: Intra-articular right distal radius fracture.  Plan: Will plan for open reduction internal fixation. Risks and benefits were discussed including arthritis neurovascular injury pain infection need for additional surgery. Patient states he understands and wished to proceed at this time.  Christian [name] V 11/26/2013, 6:26 AM

## 2013-11-26 NOTE — Anesthesia Preprocedure Evaluation (Signed)
Anesthesia Evaluation  Patient identified by MRN, date of birth, ID band Patient awake    Reviewed: Allergy & Precautions, H&P , NPO status , Patient's Chart, lab work & pertinent test results, reviewed documented beta blocker date and time   Airway Mallampati: II  Neck ROM: Full    Dental  (+) Poor Dentition and Dental Advisory Given   Pulmonary Current Smoker,  breath sounds clear to auscultation        Cardiovascular hypertension, Pt. on medications and Pt. on home beta blockers Rhythm:Regular Rate:Normal     Neuro/Psych    GI/Hepatic   Endo/Other    Renal/GU      Musculoskeletal   Abdominal   Peds  Hematology   Anesthesia Other Findings   Reproductive/Obstetrics                           Anesthesia Physical Anesthesia Plan  ASA: III  Anesthesia Plan: General   Post-op Pain Management:    Induction: Intravenous  Airway Management Planned: LMA  Additional Equipment:   Intra-op Plan:   Post-operative Plan: Extubation in OR  Informed Consent: I have reviewed the patients History and Physical, chart, labs and discussed the procedure including the risks, benefits and alternatives for the proposed anesthesia with the patient or authorized representative who has indicated his/her understanding and acceptance.   Dental advisory given  Plan Discussed with:   Anesthesia Plan Comments:         Anesthesia Quick Evaluation

## 2013-11-26 NOTE — Progress Notes (Signed)
Dr Ivin Booty came to see pt. Stressed importance of NO SMOKING & using CPAP as soon as he gets home today. This was also stressed to spouse and they verbalize understanding.

## 2013-11-26 NOTE — Transfer of Care (Signed)
Immediate Anesthesia Transfer of Care Note  Patient: Christian [name]  Procedure(s) Performed: Procedure(s) with comments: OPEN REDUCTION INTERNAL FIXATION (ORIF) DISTAL RADIAL FRACTURE (Right) - Open Reduction Internal Fixation Right Distal Radius   Patient Location: PACU  Anesthesia Type:General  Level of Consciousness: awake, alert  and oriented  Airway & Oxygen Therapy: Patient Spontanous Breathing  Post-op Assessment: Report given to PACU RN  Post vital signs: Reviewed and stable  Complications: No apparent anesthesia complications

## 2013-11-26 NOTE — Progress Notes (Signed)
Pt has increased work of breathing, expir wheezes, abd retractions upon adm to PACU. Changed to O2 by [place] as mask seems to agitate him. CRNA notified Dr Ivin Booty, new order for Albuterol neb-given to pt with improvement  and decreased wheezing. O2 sats 100% thruout. Will cont to monitor closely.

## 2013-11-26 NOTE — Op Note (Signed)
OPERATIVE REPORT  DATE OF SURGERY: 11/26/2013  PATIENT:  Christian [name],  67 y.o. male  PRE-OPERATIVE DIAGNOSIS:  Right Distal Radius Fracture  POST-OPERATIVE DIAGNOSIS:  Right Distal Radius Fracture  PROCEDURE:  Procedure(s): OPEN REDUCTION INTERNAL FIXATION (ORIF) DISTAL RADIAL FRACTURE  SURGEON:  Surgeon(s): Nadara Mustard, MD  ANESTHESIA:   general  EBL:  Minimal ML  SPECIMEN:  No Specimen  TOURNIQUET:  * No tourniquets in log *  PROCEDURE DETAILS: Patient is a 67 year old gentleman who fell from a ladder sustaining a right proximal humerus fracture right rib fractures and intra-articular right distal radius fracture. Patient presents at this time for open reduction internal fixation of the right wrist. Risks and benefits were discussed including infection neurovascular injury arthritis need for additional surgery. Patient states he understands and wished to proceed at this time. Description of procedure patient was brought to the operating room and underwent a general anesthetic. After adequate levels of anesthesia were obtained patient's right upper extremity was prepped using DuraPrep and draped into a sterile field. An extensile approach of Sherilyn Cooter was used this was bluntly carried down to the FCR. The FCR sheath was incised and the FCR was retracted ulnarly. The neurovascular bundle was retracted radially. The quadratus was elevated the bone edges were freshened and the fracture was reduced. The plate was applied stabilized with a K wire to aligned he plate with the C-arm. The plate was then secured proximally with 2 compression screws 2 locking screws and distally with 3 threaded screws and 3 smooth screws. The wound was irrigated with normal saline. C-arm fluoroscopy verified reduction. Subcutaneous is closed using 2-0 Vicryl. The skin was closed using staples. The wound was covered with Adaptic orthopedic sponges AB dressing Kerlix and Coban.  PLAN OF CARE: Discharge to home  after PACU  PATIENT DISPOSITION:  PACU - hemodynamically stable.   Nadara Mustard, MD 11/26/2013 1:55 PM

## 2013-11-26 NOTE — Anesthesia Postprocedure Evaluation (Signed)
  Anesthesia Post-op Note  Patient: Christian [name]  Procedure(s) Performed: Procedure(s) with comments: OPEN REDUCTION INTERNAL FIXATION (ORIF) DISTAL RADIAL FRACTURE (Right) - Open Reduction Internal Fixation Right Distal Radius   Patient Location: PACU  Anesthesia Type:GA combined with regional for post-op pain  Level of Consciousness: awake, alert  and oriented  Airway and Oxygen Therapy: Patient Spontanous Breathing and Patient connected to face mask oxygen  Post-op Pain: none  Post-op Assessment: Post-op Vital signs reviewed  Post-op Vital Signs: Reviewed  Complications: No apparent anesthesia complications

## 2013-11-26 NOTE — Preoperative (Signed)
Beta Blockers   Reason not to administer Beta Blockers:Not Applicable 

## 2013-11-26 NOTE — Progress Notes (Signed)
Got pt up in recliner-his breathing "feels better" when in chair. Says he has been sleeping in recliner at home all week. Dr Ivin Booty here to see pt--updated. RA sat 91-93%. Will cont to monitor.

## 2013-11-30 ENCOUNTER — Encounter (HOSPITAL_COMMUNITY): Payer: Self-pay | Admitting: Orthopedic Surgery

## 2013-12-17 ENCOUNTER — Other Ambulatory Visit: Payer: Self-pay | Admitting: Family Medicine

## 2014-01-28 ENCOUNTER — Other Ambulatory Visit: Payer: Self-pay | Admitting: Family Medicine

## 2014-05-07 ENCOUNTER — Other Ambulatory Visit: Payer: Self-pay | Admitting: Family Medicine

## 2014-05-09 NOTE — Telephone Encounter (Signed)
appt sch 06/23/14. Do you want to RF until then?

## 2014-05-10 ENCOUNTER — Other Ambulatory Visit: Payer: Self-pay | Admitting: Family Medicine

## 2014-05-10 NOTE — Telephone Encounter (Signed)
Called in.

## 2014-06-23 ENCOUNTER — Encounter: Payer: Federal, State, Local not specified - PPO | Admitting: Family Medicine

## 2014-07-28 ENCOUNTER — Ambulatory Visit (INDEPENDENT_AMBULATORY_CARE_PROVIDER_SITE_OTHER): Payer: Medicare Other

## 2014-07-28 ENCOUNTER — Ambulatory Visit (INDEPENDENT_AMBULATORY_CARE_PROVIDER_SITE_OTHER): Payer: Medicare Other | Admitting: Family Medicine

## 2014-07-28 ENCOUNTER — Encounter: Payer: Self-pay | Admitting: Family Medicine

## 2014-07-28 VITALS — BP 125/71 | HR 60 | Temp 97.6°F | Resp 16 | Ht 72.0 in | Wt 281.0 lb

## 2014-07-28 DIAGNOSIS — J42 Unspecified chronic bronchitis: Secondary | ICD-10-CM

## 2014-07-28 DIAGNOSIS — M545 Low back pain, unspecified: Secondary | ICD-10-CM

## 2014-07-28 DIAGNOSIS — G8929 Other chronic pain: Secondary | ICD-10-CM

## 2014-07-28 DIAGNOSIS — J449 Chronic obstructive pulmonary disease, unspecified: Secondary | ICD-10-CM | POA: Insufficient documentation

## 2014-07-28 DIAGNOSIS — F411 Generalized anxiety disorder: Secondary | ICD-10-CM | POA: Insufficient documentation

## 2014-07-28 DIAGNOSIS — N400 Enlarged prostate without lower urinary tract symptoms: Secondary | ICD-10-CM

## 2014-07-28 DIAGNOSIS — Z125 Encounter for screening for malignant neoplasm of prostate: Secondary | ICD-10-CM

## 2014-07-28 DIAGNOSIS — I10 Essential (primary) hypertension: Secondary | ICD-10-CM | POA: Insufficient documentation

## 2014-07-28 DIAGNOSIS — E785 Hyperlipidemia, unspecified: Secondary | ICD-10-CM | POA: Insufficient documentation

## 2014-07-28 DIAGNOSIS — Z0000 Encounter for general adult medical examination without abnormal findings: Secondary | ICD-10-CM

## 2014-07-28 LAB — CBC WITH DIFFERENTIAL/PLATELET
Basophils Absolute: 0.1 10*3/uL (ref 0.0–0.1)
Basophils Relative: 1 % (ref 0–1)
Eosinophils Absolute: 0.3 10*3/uL (ref 0.0–0.7)
Eosinophils Relative: 4 % (ref 0–5)
HCT: 42.6 % (ref 39.0–52.0)
Hemoglobin: 14.7 g/dL (ref 13.0–17.0)
Lymphocytes Relative: 32 % (ref 12–46)
Lymphs Abs: 2.5 10*3/uL (ref 0.7–4.0)
MCH: 34.6 pg — ABNORMAL HIGH (ref 26.0–34.0)
MCHC: 34.5 g/dL (ref 30.0–36.0)
MCV: 100.2 fL — ABNORMAL HIGH (ref 78.0–100.0)
Monocytes Absolute: 0.8 10*3/uL (ref 0.1–1.0)
Monocytes Relative: 10 % (ref 3–12)
Neutro Abs: 4.1 10*3/uL (ref 1.7–7.7)
Neutrophils Relative %: 53 % (ref 43–77)
Platelets: 164 10*3/uL (ref 150–400)
RBC: 4.25 MIL/uL (ref 4.22–5.81)
RDW: 13.2 % (ref 11.5–15.5)
WBC: 7.8 10*3/uL (ref 4.0–10.5)

## 2014-07-28 LAB — COMPLETE METABOLIC PANEL WITH GFR
ALT: 13 U/L (ref 0–53)
AST: 13 U/L (ref 0–37)
Albumin: 4.3 g/dL (ref 3.5–5.2)
Alkaline Phosphatase: 63 U/L (ref 39–117)
BUN: 16 mg/dL (ref 6–23)
CO2: 23 mEq/L (ref 19–32)
Calcium: 8.9 mg/dL (ref 8.4–10.5)
Chloride: 106 mEq/L (ref 96–112)
Creat: 0.85 mg/dL (ref 0.50–1.35)
GFR, Est African American: >89 mL/min
GFR, Est Non African American: >89 mL/min
Glucose, Bld: 113 mg/dL — ABNORMAL HIGH (ref 70–99)
Potassium: 4.5 mEq/L (ref 3.5–5.3)
Sodium: 138 mEq/L (ref 135–145)
Total Bilirubin: 0.5 mg/dL (ref 0.2–1.2)
Total Protein: 6.5 g/dL (ref 6.0–8.3)

## 2014-07-28 LAB — IFOBT (OCCULT BLOOD): IFOBT: NEGATIVE

## 2014-07-28 LAB — LIPID PANEL
Cholesterol: 113 mg/dL (ref 0–200)
HDL: 32 mg/dL — ABNORMAL LOW (ref >39)
LDL Cholesterol: 66 mg/dL (ref 0–99)
Total CHOL/HDL Ratio: 3.5 Ratio
Triglycerides: 74 mg/dL (ref <150)
VLDL: 15 mg/dL (ref 0–40)

## 2014-07-28 MED ORDER — CLONAZEPAM 0.5 MG PO TABS: ORAL_TABLET | ORAL | Status: DC

## 2014-07-28 MED ORDER — TIZANIDINE HCL 2 MG PO CAPS: 2.0000 mg | ORAL_CAPSULE | Freq: Three times a day (TID) | ORAL | Status: DC

## 2014-07-28 MED ORDER — SIMVASTATIN 40 MG PO TABS: 40.0000 mg | ORAL_TABLET | Freq: Every day | ORAL | Status: DC

## 2014-07-28 MED ORDER — METOPROLOL SUCCINATE ER 25 MG PO TB24: 25.0000 mg | ORAL_TABLET | Freq: Every day | ORAL | Status: DC

## 2014-07-28 NOTE — Progress Notes (Addendum)
This chart was scribed for Robyn Haber, MD by Ladene Artist, ED Scribe. The patient was seen in room 25. Patient's care was started at 10:21 AM.  Patient ID: Christian [name] MRN: [id number], DOB: [birth year], 68 y.o. Date of Encounter: 07/28/2014, 10:21 AM  Primary Physician: Robyn Haber, MD  Chief Complaint: Annual Exam   HPI: 68 y.o. year old male with history below presents for an annual exam. Pt reports intermittent light-headedness onset a few weeks ago. He reports another episode of light-headedness 1 week ago with a BP reading of 145/70s. Pt took Allegra D which relieved his symptoms. He reports another episode of light-headedness since then. He denies tinnitus or hearing loss.  Pt also states that Medicare, [facility], has stopped him from refilling Robaxin. Pt uses medication for back pain.   Pt states that he fell from a ladder 6-8 ft in 11/2013 and had R radial surgery by Dr. Sharol Given. Pt still reports associated numbness.   Pt reports chronic cough.   Colonoscopy done at [facility] Pt has had a full cardiac evaluation done in the past that showed revascularization of the coronaries. He did not need any procedure.   Pt still smokes Menthol cigarettes. He states that he has not tried to quit. Pt is retired; he works 1 day per week. Pt sales maps and sun glasses. Pt has a vacation house at [facility]  Pt states that his wife's ovarian cancer returned and is now in her kidney and colon. She is currently undergoing chemo. Pt's wife, age 54, has also had 2 hip replacements and stroke within the past 2 years.   Past Medical History  Diagnosis Date  . Hyperlipidemia   . Hypertension   . Anxiety   . Cancer     skin  . History of shingles     2004     Home Meds: Prior to Admission medications   Medication Sig Start Date End Date Taking? Authorizing Provider  aspirin 325 MG tablet Take 325 mg by mouth daily.    Historical Provider, MD  clonazePAM (KLONOPIN) 0.5  MG tablet TAKE 1 TABLET BY MOUTH AT BEDTIME AS NEEDED FOR ANXIETY    Robyn Haber, MD  HYDROcodone-acetaminophen (NORCO) 5-325 MG per tablet Take 1 tablet by mouth every 6 (six) hours as needed. 11/26/13   Newt Minion, MD  lisinopril (PRINIVIL,ZESTRIL) 10 MG tablet Take 1 tablet (10 mg total) by mouth daily. PATIENT NEEDS OFFICE VISIT FOR ADDITIONAL REFILLS 11/18/13   Robyn Haber, MD  lisinopril (PRINIVIL,ZESTRIL) 10 MG tablet PT NEEDS APPT FOR FURTHER REFILLS 12/17/13   Collene Leyden, PA-C  Melaton-Thean-Cham-PassF-LBalm (MELATONIN + L-THEANINE PO) Take 1 tablet by mouth at bedtime as needed (sleep).     Historical Provider, MD  methocarbamol (ROBAXIN) 750 MG tablet Take 1 tablet (750 mg total) by mouth 4 (four) times daily as needed. 06/02/13   Robyn Haber, MD  metoprolol succinate (TOPROL-XL) 25 MG 24 hr tablet Take 25 mg by mouth daily.    Historical Provider, MD  oxyCODONE-acetaminophen (PERCOCET) 10-325 MG per tablet Take 1 tablet by mouth every 4 (four) hours as needed for pain. 11/22/13   Corky Sox, MD  simvastatin (ZOCOR) 40 MG tablet Take 40 mg by mouth at bedtime.    Historical Provider, MD  vitamin E 100 UNIT capsule Take 100 Units by mouth daily.    Historical Provider, MD    Allergies:  Allergies  Allergen Reactions  . Celebrex [Celecoxib] Itching  History   Social History  . Marital Status: Married    Spouse Name: N/A    Number of Children: N/A  . Years of Education: N/A   Occupational History  . Not on file.   Social History Main Topics  . Smoking status: Current Every Day Smoker -- 1.00 packs/day for 40 years    Types: Cigarettes  . Smokeless tobacco: Not on file  . Alcohol Use: Yes     Comment: 1 glass of wine per night  . Drug Use: No  . Sexual Activity: Not on file   Other Topics Concern  . Not on file   Social History Narrative  . No narrative on file     Review of Systems: Constitutional: negative for chills, fever, night sweats,  weight changes, or fatigue  HEENT: negative for vision changes, hearing loss, tinnitus, congestion, rhinorrhea, ST, epistaxis, or sinus pressure Cardiovascular: negative for chest pain or palpitations Respiratory: negative for hemoptysis, wheezing, or shortness of breath, +cough (chronic)-refuses to quit smoking Abdominal: negative for abdominal pain, nausea, vomiting, diarrhea, or constipation Dermatological: negative for rash Neurologic: negative for headache, dizziness, or syncope, +numbness following ORIF right forearm, +light-headedness when getting up quickly All other systems reviewed and are otherwise negative with the exception to those above and in the HPI.   Physical Exam: Blood pressure 125/71, pulse 60, temperature 97.6 F (36.4 C), resp. rate 16, height 6' (1.829 m), weight 281 lb (127.461 kg), SpO2 98.00%., Body mass index is 38.1 kg/(m^2). General: Well developed, well nourished, in no acute distress. Head: Normocephalic, atraumatic, eyes without discharge, sclera non-icteric, nares are without discharge. Bilateral auditory canals clear, TM's are without perforation, pearly grey and translucent with reflective cone of light bilaterally. Oral cavity moist, posterior pharynx without exudate, erythema, peritonsillar abscess, or post nasal drip.  Neck: Supple. No thyromegaly. Full ROM. No lymphadenopathy. Lungs: Clear bilaterally to auscultation without wheezes, rales, but rhonchi are present. Breathing is unlabored. Heart: RRR with S1 S2. No murmurs, rubs, or gallops appreciated. Abdomen: Soft, non-tender, non-distended with normoactive bowel sounds. No hepatomegaly. No rebound/guarding. No obvious abdominal masses. Msk:  Strength and tone normal for age. GU: moderately enlarged smooth prostate  Extremities/Skin: Warm and dry. No clubbing or cyanosis. No edema. No rashes or suspicious lesions. Neuro: Alert and oriented X 3. Moves all extremities spontaneously. Gait is normal.  CNII-XII grossly in tact. Psych:  Responds to questions appropriately with a normal affect.   Labs: Results for orders placed in visit on 07/28/14  IFOBT (OCCULT BLOOD)      Result Value Ref Range   IFOBT Negative     [facility] reading (PRIMARY) by  Dr. Joseph Art:  Negative CXR.    ASSESSMENT AND PLAN:  68 y.o. year old male with Routine general medical examination at a health care facility  Unspecified chronic bronchitis - Plan: CBC with Differential, DG Chest 2 View  Unspecified essential hypertension - Plan: CBC with Differential, Lipid panel, COMPLETE METABOLIC PANEL WITH GFR  Other and unspecified hyperlipidemia - Plan: Lipid panel  BPH (benign prostatic hypertrophy) - Plan: CANCELED: POCT UA - Microscopic Only, CANCELED: POCT urinalysis dipstick  Screening for prostate cancer - Plan: IFOBT POC (occult bld, rslt in office)   Signed, Robyn Haber, MD 07/28/2014 10:21 AM

## 2014-07-28 NOTE — Patient Instructions (Signed)
Health Maintenance A healthy lifestyle and preventative care can promote health and wellness.  Maintain regular health, dental, and eye exams.  Eat a healthy diet. Foods like vegetables, fruits, whole grains, low-fat dairy products, and lean protein foods contain the nutrients you need and are low in calories. Decrease your intake of foods high in solid fats, added sugars, and salt. Get information about a proper diet from your health care provider, if necessary.  Regular physical exercise is one of the most important things you can do for your health. Most adults should get at least 150 minutes of moderate-intensity exercise (any activity that increases your heart rate and causes you to sweat) each week. In addition, most adults need muscle-strengthening exercises on 2 or more days a week.   Maintain a healthy weight. The body mass index (BMI) is a screening tool to identify possible weight problems. It provides an estimate of body fat based on height and weight. Your health care provider can find your BMI and can help you achieve or maintain a healthy weight. For males 20 years and older:  A BMI below 18.5 is considered underweight.  A BMI of 18.5 to 24.9 is normal.  A BMI of 25 to 29.9 is considered overweight.  A BMI of 30 and above is considered obese.  Maintain normal blood lipids and cholesterol by exercising and minimizing your intake of saturated fat. Eat a balanced diet with plenty of fruits and vegetables. Blood tests for lipids and cholesterol should begin at age 20 and be repeated every 5 years. If your lipid or cholesterol levels are high, you are over age 50, or you are at high risk for heart disease, you may need your cholesterol levels checked more frequently.Ongoing high lipid and cholesterol levels should be treated with medicines if diet and exercise are not working.  If you smoke, find out from your health care provider how to quit. If you do not use tobacco, do not  start.  Lung cancer screening is recommended for adults aged 55-80 years who are at high risk for developing lung cancer because of a history of smoking. A yearly low-dose CT scan of the lungs is recommended for people who have at least a 30-pack-year history of smoking and are current smokers or have quit within the past 15 years. A pack year of smoking is smoking an average of 1 pack of cigarettes a day for 1 year (for example, a 30-pack-year history of smoking could mean smoking 1 pack a day for 30 years or 2 packs a day for 15 years). Yearly screening should continue until the smoker has stopped smoking for at least 15 years. Yearly screening should be stopped for people who develop a health problem that would prevent them from having lung cancer treatment.  If you choose to drink alcohol, do not have more than 2 drinks per day. One drink is considered to be 12 oz (360 mL) of beer, 5 oz (150 mL) of wine, or 1.5 oz (45 mL) of liquor.  Avoid the use of street drugs. Do not share needles with anyone. Ask for help if you need support or instructions about stopping the use of drugs.  High blood pressure causes heart disease and increases the risk of stroke. Blood pressure should be checked at least every 1-2 years. Ongoing high blood pressure should be treated with medicines if weight loss and exercise are not effective.  If you are 45-79 years old, ask your health care provider if   you should take aspirin to prevent heart disease.  Diabetes screening involves taking a blood sample to check your fasting blood sugar level. This should be done once every 3 years after age 45 if you are at a normal weight and without risk factors for diabetes. Testing should be considered at a younger age or be carried out more frequently if you are overweight and have at least 1 risk factor for diabetes.  Colorectal cancer can be detected and often prevented. Most routine colorectal cancer screening begins at the age of 50  and continues through age 75. However, your health care provider may recommend screening at an earlier age if you have risk factors for colon cancer. On a yearly basis, your health care provider may provide home test kits to check for hidden blood in the stool. A small camera at the end of a tube may be used to directly examine the colon (sigmoidoscopy or colonoscopy) to detect the earliest forms of colorectal cancer. Talk to your health care provider about this at age 50 when routine screening begins. A direct exam of the colon should be repeated every 5-10 years through age 75, unless early forms of precancerous polyps or small growths are found.  People who are at an increased risk for hepatitis B should be screened for this virus. You are considered at high risk for hepatitis B if:  You were born in a country where hepatitis B occurs often. Talk with your health care provider about which countries are considered high risk.  Your parents were born in a high-risk country and you have not received a shot to protect against hepatitis B (hepatitis B vaccine).  You have HIV or AIDS.  You use needles to inject street drugs.  You live with, or have sex with, someone who has hepatitis B.  You are a man who has sex with other men (MSM).  You get hemodialysis treatment.  You take certain medicines for conditions like cancer, organ transplantation, and autoimmune conditions.  Hepatitis C blood testing is recommended for all people born from 1945 through 1965 and any individual with known risk factors for hepatitis C.  Healthy men should no longer receive prostate-specific antigen (PSA) blood tests as part of routine cancer screening. Talk to your health care provider about prostate cancer screening.  Testicular cancer screening is not recommended for adolescents or adult males who have no symptoms. Screening includes self-exam, a health care provider exam, and other screening tests. Consult with your  health care provider about any symptoms you have or any concerns you have about testicular cancer.  Practice safe sex. Use condoms and avoid high-risk sexual practices to reduce the spread of sexually transmitted infections (STIs).  You should be screened for STIs, including gonorrhea and chlamydia if:  You are sexually active and are younger than 24 years.  You are older than 24 years, and your health care provider tells you that you are at risk for this type of infection.  Your sexual activity has changed since you were last screened, and you are at an increased risk for chlamydia or gonorrhea. Ask your health care provider if you are at risk.  If you are at risk of being infected with HIV, it is recommended that you take a prescription medicine daily to prevent HIV infection. This is called pre-exposure prophylaxis (PrEP). You are considered at risk if:  You are a man who has sex with other men (MSM).  You are a heterosexual man who   is sexually active with multiple partners.  You take drugs by injection.  You are sexually active with a partner who has HIV.  Talk with your health care provider about whether you are at high risk of being infected with HIV. If you choose to begin PrEP, you should first be tested for HIV. You should then be tested every 3 months for as long as you are taking PrEP.  Use sunscreen. Apply sunscreen liberally and repeatedly throughout the day. You should seek shade when your shadow is shorter than you. Protect yourself by wearing long sleeves, pants, a wide-brimmed hat, and sunglasses year round whenever you are outdoors.  Tell your health care provider of new moles or changes in moles, especially if there is a change in shape or color. Also, tell your health care provider if a mole is larger than the size of a pencil eraser.  A one-time screening for abdominal aortic aneurysm (AAA) and surgical repair of large AAAs by ultrasound is recommended for men aged  60-75 years who are current or former smokers.  Stay current with your vaccines (immunizations). Document Released: 05/23/2008 Document Revised: 11/30/2013 Document Reviewed: 04/22/2011 [facility] Patient Information 2015 [place], [place]. This information is not intended to replace advice given to you by your health care provider. Make sure you discuss any questions you have with your health care provider. Advance Directive Advance directives are the legal documents that allow you to make choices about your health care and medical treatment if you cannot speak for yourself. Advance directives are a way for you to communicate your wishes to family, friends, and health care providers. The specified people can then convey your decisions about end-of-life care to avoid confusion if you should become unable to communicate. Ideally, the process of discussing and writing advance directives should happen over time rather than making decisions all at once. Advance directives can be modified as your situation changes, and you can change your mind at any time, even after you have signed the advance directives. Each state has its own laws regarding advance directives. You may want to check with your health care provider, attorney, or state representative about the law in your state. Below are some examples of advance directives. LIVING WILL A living will is a set of instructions documenting your wishes about medical care when you cannot care for yourself. It is used if you become:  Terminally ill.  Incapacitated.  Unable to communicate.  Unable to make decisions. Items to consider in your living will include:  The use or non-use of life-sustaining equipment, such as dialysis machines and breathing machines (ventilators).  A do not resuscitate (DNR) order, which is the instruction not to use cardiopulmonary resuscitation (CPR) if breathing or heartbeat stops.  Tube feeding.  Withholding of food and  fluids.  Comfort (palliative) care when the goal becomes comfort rather than a cure.  Organ and tissue donation. A living will does not give instructions about distribution of your money and property if you should pass away. It is advisable to seek the expert advice of a lawyer in drawing up a will regarding your possessions. Decisions about taxes, beneficiaries, and asset distribution will be legally binding. This process can relieve your family and friends of any burdens surrounding disputes or questions that may come up about the allocation of your assets. DO NOT RESUSCITATE (DNR) A do not resuscitate (DNR) order is a request to not have CPR in the event that your heart stops beating or you stop  breathing. Unless given other instructions, a health care provider will try to help any patient whose heart has stopped or who has stopped breathing.  HEALTH CARE PROXY AND DURABLE POWER OF ATTORNEY FOR HEALTH CARE A health care proxy is a person (agent) appointed to make medical decisions for you if you cannot. Generally, people choose someone they know well and trust to represent their preferences when they can no longer do so. You should be sure to ask this person for agreement to act as your agent. An agent may have to exercise judgment in the event of a medical decision for which your wishes are not known. The durable power of attorney for health care is the legal document that names your health care proxy. Once written, it should be:  Signed.  Notarized.  Dated.  Copied.  Witnessed.  Incorporated into your medical record. You may also want to appoint someone to manage your financial affairs if you cannot. This is called a durable power of attorney for finances. It is a separate legal document from the durable power of attorney for health care. You may choose the same person or someone different from your health care proxy to act as your agent in financial matters. Document Released:  03/03/2008 Document Revised: 11/30/2013 Document Reviewed: 04/14/2013 [facility] Patient Information 2015 [place], [place]. This information is not intended to replace advice given to you by your health care provider. Make sure you discuss any questions you have with your health care provider.

## 2014-08-09 ENCOUNTER — Telehealth: Payer: Self-pay

## 2014-08-09 NOTE — Telephone Encounter (Signed)
PA needed for clonazepam (high risk med in elderly). Pt is Rxd this for insomnia and anxiety. Completed on phone and pending decision.

## 2014-08-11 NOTE — Telephone Encounter (Signed)
Last time I checked, clonazepam was used for both anxiety and insomnia.   The medicine is now generic and should be relatively affordable regardless of insurance coverage.

## 2014-08-11 NOTE — Telephone Encounter (Signed)
Notified pt and suggested he call different pharms to check cost. Pt agreed.

## 2014-08-11 NOTE — Telephone Encounter (Signed)
Received VM from ins stating the PA was denied for this med. Called [employer] back to get details and preferred med and was advised that it was denied bc anxiety and/or insomnia are not FDA approved Dxs for this med. They did not have a list of covered meds. Do you want to appeal this or Rx something else?

## 2014-08-22 ENCOUNTER — Encounter: Payer: Self-pay | Admitting: Gastroenterology

## 2014-11-22 ENCOUNTER — Telehealth: Payer: Self-pay | Admitting: Family Medicine

## 2014-11-22 NOTE — Telephone Encounter (Signed)
11/17/14 at [facility]

## 2015-02-27 ENCOUNTER — Ambulatory Visit (INDEPENDENT_AMBULATORY_CARE_PROVIDER_SITE_OTHER): Payer: Medicare HMO | Admitting: Family Medicine

## 2015-02-27 ENCOUNTER — Ambulatory Visit (INDEPENDENT_AMBULATORY_CARE_PROVIDER_SITE_OTHER): Payer: Medicare HMO

## 2015-02-27 VITALS — BP 140/70 | HR 88 | Temp 97.9°F | Resp 18 | Ht 72.0 in | Wt 297.6 lb

## 2015-02-27 DIAGNOSIS — R062 Wheezing: Secondary | ICD-10-CM

## 2015-02-27 DIAGNOSIS — R05 Cough: Secondary | ICD-10-CM | POA: Diagnosis not present

## 2015-02-27 DIAGNOSIS — F411 Generalized anxiety disorder: Secondary | ICD-10-CM | POA: Diagnosis not present

## 2015-02-27 DIAGNOSIS — R21 Rash and other nonspecific skin eruption: Secondary | ICD-10-CM

## 2015-02-27 DIAGNOSIS — R059 Cough, unspecified: Secondary | ICD-10-CM

## 2015-02-27 LAB — COMPREHENSIVE METABOLIC PANEL
ALT: 13 U/L (ref 0–53)
AST: 14 U/L (ref 0–37)
Albumin: 4 g/dL (ref 3.5–5.2)
Alkaline Phosphatase: 72 U/L (ref 39–117)
BUN: 15 mg/dL (ref 6–23)
CO2: 24 mEq/L (ref 19–32)
Calcium: 9.1 mg/dL (ref 8.4–10.5)
Chloride: 105 mEq/L (ref 96–112)
Creat: 0.92 mg/dL (ref 0.50–1.35)
Glucose, Bld: 156 mg/dL — ABNORMAL HIGH (ref 70–99)
Potassium: 4.3 mEq/L (ref 3.5–5.3)
Sodium: 137 mEq/L (ref 135–145)
Total Bilirubin: 0.4 mg/dL (ref 0.2–1.2)
Total Protein: 6.5 g/dL (ref 6.0–8.3)

## 2015-02-27 LAB — POCT CBC
Granulocyte percent: 69.1 %G (ref 37–80)
HCT, POC: 44.5 % (ref 43.5–53.7)
Hemoglobin: 14.5 g/dL (ref 14.1–18.1)
Lymph, poc: 2.8 (ref 0.6–3.4)
MCH, POC: 33.9 pg — AB (ref 27–31.2)
MCHC: 32.5 g/dL (ref 31.8–35.4)
MCV: 104.3 fL — AB (ref 80–97)
MID (cbc): 0.5 (ref 0–0.9)
MPV: 7.2 fL (ref 0–99.8)
POC Granulocyte: 7.4 — AB (ref 2–6.9)
POC LYMPH PERCENT: 25.9 %L (ref 10–50)
POC MID %: 5 %M (ref 0–12)
Platelet Count, POC: 194 10*3/uL (ref 142–424)
RBC: 4.21 M/uL — AB (ref 4.69–6.13)
RDW, POC: 13.7 %
WBC: 10.7 10*3/uL — AB (ref 4.6–10.2)

## 2015-02-27 LAB — POCT GLYCOSYLATED HEMOGLOBIN (HGB A1C): Hemoglobin A1C: 6.2

## 2015-02-27 MED ORDER — ALBUTEROL SULFATE HFA 108 (90 BASE) MCG/ACT IN AERS: 2.0000 | INHALATION_SPRAY | Freq: Four times a day (QID) | RESPIRATORY_TRACT | Status: DC | PRN

## 2015-02-27 MED ORDER — KETOCONAZOLE 2 % EX CREA: 1.0000 "application " | TOPICAL_CREAM | Freq: Two times a day (BID) | CUTANEOUS | Status: DC

## 2015-02-27 MED ORDER — CLONAZEPAM 0.5 MG PO TABS: ORAL_TABLET | ORAL | Status: DC

## 2015-02-27 MED ORDER — AZITHROMYCIN 250 MG PO TABS: ORAL_TABLET | ORAL | Status: DC

## 2015-02-27 NOTE — Addendum Note (Signed)
Addended by: Robyn Haber on: 02/27/2015 03:38 PM   Modules accepted: Orders

## 2015-02-27 NOTE — Progress Notes (Addendum)
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 He continues to smoke.  Patient's had 2 weeks of sinus congestion and cough with wheezing. He notices green nasal discharge as well as yellow-green productive cough. He's had an low-grade temperature of 99 at times.  In addition patient notices shortness of breath when walking short distances. He says that Robitussin is helping control the cough at night.  Patient does mention an episode he had several weeks ago when he awoke with a rapid heartbeat which lasted a few minutes. It stopped spontaneously and he went back to bed and has had no recurrences of this. His sister is undergoing ablation therapy for an irregular heart.  Objective: Patient is obese and in no acute respiratory distress. HEENT: Patient has extremely poor dentition. His nose shows scattered yellow mucopurulent discharge in both nasal passages. Neck: Supple no adenopathy or thyromegaly Chest: Diffuse wheezes bilaterally Heart: Regular no murmur Skin: Normal color, no clubbing  Results for orders placed or performed in visit on 02/27/15  POCT CBC  Result Value Ref Range   WBC 10.7 (A) 4.6 - 10.2 K/uL   Lymph, poc 2.8 0.6 - 3.4   POC LYMPH PERCENT 25.9 10 - 50 %L   MID (cbc) 0.5 0 - 0.9   POC MID % 5.0 0 - 12 %M   POC Granulocyte 7.4 (A) 2 - 6.9   Granulocyte percent 69.1 37 - 80 %G   RBC 4.21 (A) 4.69 - 6.13 M/uL   Hemoglobin 14.5 14.1 - 18.1 g/dL   HCT, POC 44.5 43.5 - 53.7 %   MCV 104.3 (A) 80 - 97 fL   MCH, POC 33.9 (A) 27 - 31.2 pg   MCHC 32.5 31.8 - 35.4 g/dL   RDW, POC 13.7 %   Platelet Count, POC 194 142 - 424 K/uL   MPV 7.2 0 - 99.8 fL  POCT glycosylated hemoglobin (Hb A1C)  Result Value Ref Range   Hemoglobin A1C 6.2    [facility] reading (PRIMARY) by  Dr. Joseph Art CXR: no infiltrate, heavy bronchial markings, normal heart size.   This chart was scribed in my presence and reviewed by me personally.    ICD-9-CM ICD-10-CM   1.  Cough 786.2 R05 POCT CBC     POCT glycosylated hemoglobin (Hb A1C)     Comprehensive metabolic panel     DG Chest 2 View     albuterol (PROVENTIL HFA;VENTOLIN HFA) 108 (90 BASE) MCG/ACT inhaler     POCT glucose (manual entry)     azithromycin (ZITHROMAX Z-PAK) 250 MG tablet  2. Wheezing 786.07 R06.2 POCT CBC     POCT glycosylated hemoglobin (Hb A1C)     Comprehensive metabolic panel     DG Chest 2 View     albuterol (PROVENTIL HFA;VENTOLIN HFA) 108 (90 BASE) MCG/ACT inhaler     POCT glucose (manual entry)     azithromycin (ZITHROMAX Z-PAK) 250 MG tablet  3. Rash and nonspecific skin eruption 782.1 R21 ketoconazole (NIZORAL) 2 % cream     POCT glucose (manual entry)  4. Anxiety state 300.00 F41.1 clonazePAM (KLONOPIN) 0.5 MG tablet     Signed, Robyn Haber, MD

## 2015-02-27 NOTE — Patient Instructions (Signed)
Body Ringworm Ringworm (tinea corporis) is a fungal infection of the skin on the body. This infection is not caused by worms, but is actually caused by a fungus. Fungus normally lives on the top of your skin and can be useful. However, in the case of ringworms, the fungus grows out of control and causes a skin infection. It can involve any area of skin on the body and can spread easily from one person to another (contagious). Ringworm is a common problem for children, but it can affect adults as well. Ringworm is also often found in athletes, especially wrestlers who share equipment and mats.  CAUSES  Ringworm of the body is caused by a fungus called dermatophyte. It can spread by:  Touchingother people who are infected.  Touchinginfected pets.  Touching or sharingobjects that have been in contact with the infected person or pet (hats, combs, towels, clothing, sports equipment). SYMPTOMS   Itchy, raised red spots and bumps on the skin.  Ring-shaped rash.  Redness near the border of the rash with a clear center.  Dry and scaly skin on or around the rash. Not every person develops a ring-shaped rash. Some develop only the red, scaly patches. DIAGNOSIS  Most often, ringworm can be diagnosed by performing a skin exam. Your caregiver may choose to take a skin scraping from the affected area. The sample will be examined under the microscope to see if the fungus is present.  TREATMENT  Body ringworm may be treated with a topical antifungal cream or ointment. Sometimes, an antifungal shampoo that can be used on your body is prescribed. You may be prescribed antifungal medicines to take by mouth if your ringworm is severe, keeps coming back, or lasts a long time.  HOME CARE INSTRUCTIONS   Only take over-the-counter or prescription medicines as directed by your caregiver.  Wash the infected area and dry it completely before applying yourcream or ointment.  When using antifungal shampoo to  treat the ringworm, leave the shampoo on the body for 3-5 minutes before rinsing.   Wear loose clothing to stop clothes from rubbing and irritating the rash.  Wash or change your bed sheets every night while you have the rash.  Have your pet treated by your veterinarian if it has the same infection. To prevent ringworm:   Practice good hygiene.  Wear sandals or shoes in public places and showers.  Do not share personal items with others.  Avoid touching red patches of skin on other people.  Avoid touching pets that have bald spots or wash your hands after doing so. SEEK MEDICAL CARE IF:   Your rash continues to spread after 7 days of treatment.  Your rash is not gone in 4 weeks.  The area around your rash becomes red, warm, tender, and swollen. Document Released: 11/22/2000 Document Revised: 08/19/2012 Document Reviewed: 06/08/2012 [facility] Patient Information 2015 [place], [place]. This information is not intended to replace advice given to you by your health care provider. Make sure you discuss any questions you have with your health care provider. Acute Bronchitis Bronchitis is inflammation of the airways that extend from the windpipe into the lungs (bronchi). The inflammation often causes mucus to develop. This leads to a cough, which is the most common symptom of bronchitis.  In acute bronchitis, the condition usually develops suddenly and goes away over time, usually in a couple weeks. Smoking, allergies, and asthma can make bronchitis worse. Repeated episodes of bronchitis may cause further lung problems.  CAUSES Acute  bronchitis is most often caused by the same virus that causes a cold. The virus can spread from person to person (contagious) through coughing, sneezing, and touching contaminated objects. SIGNS AND SYMPTOMS   Cough.   Fever.   Coughing up mucus.   Body aches.   Chest congestion.   Chills.   Shortness of breath.   Sore throat.  DIAGNOSIS   Acute bronchitis is usually diagnosed through a physical exam. Your health care provider will also ask you questions about your medical history. Tests, such as chest X-rays, are sometimes done to rule out other conditions.  TREATMENT  Acute bronchitis usually goes away in a couple weeks. Oftentimes, no medical treatment is necessary. Medicines are sometimes given for relief of fever or cough. Antibiotic medicines are usually not needed but may be prescribed in certain situations. In some cases, an inhaler may be recommended to help reduce shortness of breath and control the cough. A cool mist vaporizer may also be used to help thin bronchial secretions and make it easier to clear the chest.  HOME CARE INSTRUCTIONS  Get plenty of rest.   Drink enough fluids to keep your urine clear or pale yellow (unless you have a medical condition that requires fluid restriction). Increasing fluids may help thin your respiratory secretions (sputum) and reduce chest congestion, and it will prevent dehydration.   Take medicines only as directed by your health care provider.  If you were prescribed an antibiotic medicine, finish it all even if you start to feel better.  Avoid smoking and secondhand smoke. Exposure to cigarette smoke or irritating chemicals will make bronchitis worse. If you are a smoker, consider using nicotine gum or skin patches to help control withdrawal symptoms. Quitting smoking will help your lungs heal faster.   Reduce the chances of another bout of acute bronchitis by washing your hands frequently, avoiding people with cold symptoms, and trying not to touch your hands to your mouth, nose, or eyes.   Keep all follow-up visits as directed by your health care provider.  SEEK MEDICAL CARE IF: Your symptoms do not improve after 1 week of treatment.  SEEK IMMEDIATE MEDICAL CARE IF:  You develop an increased fever or chills.   You have chest pain.   You have severe shortness of  breath.  You have bloody sputum.   You develop dehydration.  You faint or repeatedly feel like you are going to pass out.  You develop repeated vomiting.  You develop a severe headache. MAKE SURE YOU:   Understand these instructions.  Will watch your condition.  Will get help right away if you are not doing well or get worse. Document Released: 01/02/2005 Document Revised: 04/11/2014 Document Reviewed: 05/18/2013 [facility] Patient Information 2015 [place], [place]. This information is not intended to replace advice given to you by your health care provider. Make sure you discuss any questions you have with your health care provider.

## 2015-03-03 ENCOUNTER — Other Ambulatory Visit: Payer: Self-pay | Admitting: Family Medicine

## 2015-03-11 ENCOUNTER — Other Ambulatory Visit: Payer: Self-pay | Admitting: Family Medicine

## 2015-03-13 ENCOUNTER — Encounter: Payer: Self-pay | Admitting: Gastroenterology

## 2015-08-17 ENCOUNTER — Encounter: Payer: Self-pay | Admitting: Gastroenterology

## 2015-10-12 ENCOUNTER — Other Ambulatory Visit: Payer: Self-pay | Admitting: Family Medicine

## 2015-10-17 ENCOUNTER — Other Ambulatory Visit: Payer: Self-pay

## 2015-10-17 MED ORDER — METOPROLOL SUCCINATE ER 25 MG PO TB24: 25.0000 mg | ORAL_TABLET | Freq: Every day | ORAL | Status: DC

## 2015-11-03 ENCOUNTER — Other Ambulatory Visit: Payer: Self-pay | Admitting: Family Medicine

## 2015-11-06 NOTE — Telephone Encounter (Signed)
Faxed

## 2015-11-12 ENCOUNTER — Other Ambulatory Visit: Payer: Self-pay | Admitting: Family Medicine

## 2015-11-14 ENCOUNTER — Other Ambulatory Visit: Payer: Self-pay | Admitting: Family Medicine

## 2015-12-12 DIAGNOSIS — Z9889 Other specified postprocedural states: Secondary | ICD-10-CM | POA: Insufficient documentation

## 2015-12-12 DIAGNOSIS — I2510 Atherosclerotic heart disease of native coronary artery without angina pectoris: Secondary | ICD-10-CM | POA: Insufficient documentation

## 2015-12-13 DIAGNOSIS — E785 Hyperlipidemia, unspecified: Secondary | ICD-10-CM | POA: Diagnosis not present

## 2015-12-13 DIAGNOSIS — I2510 Atherosclerotic heart disease of native coronary artery without angina pectoris: Secondary | ICD-10-CM | POA: Diagnosis not present

## 2015-12-13 DIAGNOSIS — I519 Heart disease, unspecified: Secondary | ICD-10-CM | POA: Insufficient documentation

## 2015-12-13 DIAGNOSIS — I10 Essential (primary) hypertension: Secondary | ICD-10-CM | POA: Diagnosis not present

## 2015-12-13 DIAGNOSIS — F17219 Nicotine dependence, cigarettes, with unspecified nicotine-induced disorders: Secondary | ICD-10-CM | POA: Diagnosis not present

## 2016-05-05 ENCOUNTER — Observation Stay (HOSPITAL_COMMUNITY)
Admission: EM | Admit: 2016-05-05 | Discharge: 2016-05-07 | Disposition: A | Discharge status: Home / Self Care | Payer: PPO | Attending: Cardiovascular Disease | Admitting: Cardiovascular Disease

## 2016-05-05 ENCOUNTER — Emergency Department (HOSPITAL_COMMUNITY): Payer: PPO

## 2016-05-05 ENCOUNTER — Encounter (HOSPITAL_COMMUNITY): Payer: Self-pay

## 2016-05-05 DIAGNOSIS — Z716 Tobacco abuse counseling: Secondary | ICD-10-CM

## 2016-05-05 DIAGNOSIS — F17210 Nicotine dependence, cigarettes, uncomplicated: Secondary | ICD-10-CM | POA: Insufficient documentation

## 2016-05-05 DIAGNOSIS — E785 Hyperlipidemia, unspecified: Secondary | ICD-10-CM | POA: Diagnosis not present

## 2016-05-05 DIAGNOSIS — R0609 Other forms of dyspnea: Secondary | ICD-10-CM

## 2016-05-05 DIAGNOSIS — I200 Unstable angina: Secondary | ICD-10-CM | POA: Diagnosis not present

## 2016-05-05 DIAGNOSIS — Z79899 Other long term (current) drug therapy: Secondary | ICD-10-CM | POA: Insufficient documentation

## 2016-05-05 DIAGNOSIS — I10 Essential (primary) hypertension: Secondary | ICD-10-CM

## 2016-05-05 DIAGNOSIS — R079 Chest pain, unspecified: Secondary | ICD-10-CM

## 2016-05-05 DIAGNOSIS — Z8619 Personal history of other infectious and parasitic diseases: Secondary | ICD-10-CM | POA: Insufficient documentation

## 2016-05-05 DIAGNOSIS — F419 Anxiety disorder, unspecified: Secondary | ICD-10-CM | POA: Diagnosis not present

## 2016-05-05 DIAGNOSIS — I25110 Atherosclerotic heart disease of native coronary artery with unstable angina pectoris: Secondary | ICD-10-CM | POA: Diagnosis not present

## 2016-05-05 DIAGNOSIS — Z85828 Personal history of other malignant neoplasm of skin: Secondary | ICD-10-CM | POA: Diagnosis not present

## 2016-05-05 DIAGNOSIS — R0789 Other chest pain: Secondary | ICD-10-CM | POA: Diagnosis not present

## 2016-05-05 DIAGNOSIS — Z7982 Long term (current) use of aspirin: Secondary | ICD-10-CM | POA: Diagnosis not present

## 2016-05-05 LAB — BASIC METABOLIC PANEL
Anion gap: 6 (ref 5–15)
BUN: 12 mg/dL (ref 6–20)
CO2: 23 mmol/L (ref 22–32)
Calcium: 9 mg/dL (ref 8.9–10.3)
Chloride: 106 mmol/L (ref 101–111)
Creatinine, Ser: 0.9 mg/dL (ref 0.61–1.24)
GFR calc Af Amer: >60 mL/min (ref >60)
GFR calc non Af Amer: >60 mL/min (ref >60)
Glucose, Bld: 162 mg/dL — ABNORMAL HIGH (ref 65–99)
Potassium: 4.1 mmol/L (ref 3.5–5.1)
Sodium: 135 mmol/L (ref 135–145)

## 2016-05-05 LAB — I-STAT TROPONIN, ED: Troponin i, poc: 0 ng/mL (ref 0.00–0.08)

## 2016-05-05 LAB — TROPONIN I
Troponin I: <0.03 ng/mL (ref <0.031)
Troponin I: <0.03 ng/mL (ref <0.031)

## 2016-05-05 LAB — CBC
HCT: 41.2 % (ref 39.0–52.0)
Hemoglobin: 13.7 g/dL (ref 13.0–17.0)
MCH: 34.3 pg — ABNORMAL HIGH (ref 26.0–34.0)
MCHC: 33.3 g/dL (ref 30.0–36.0)
MCV: 103 fL — ABNORMAL HIGH (ref 78.0–100.0)
Platelets: 148 10*3/uL — ABNORMAL LOW (ref 150–400)
RBC: 4 MIL/uL — ABNORMAL LOW (ref 4.22–5.81)
RDW: 13.2 % (ref 11.5–15.5)
WBC: 9.7 10*3/uL (ref 4.0–10.5)

## 2016-05-05 MED ORDER — ALBUTEROL SULFATE (2.5 MG/3ML) 0.083% IN NEBU: 2.5000 mg | INHALATION_SOLUTION | RESPIRATORY_TRACT | Status: DC | PRN

## 2016-05-05 MED ORDER — ALPRAZOLAM 0.25 MG PO TABS: 0.2500 mg | ORAL_TABLET | Freq: Two times a day (BID) | ORAL | Status: DC | PRN

## 2016-05-05 MED ORDER — ALBUTEROL SULFATE HFA 108 (90 BASE) MCG/ACT IN AERS: 2.0000 | INHALATION_SPRAY | Freq: Four times a day (QID) | RESPIRATORY_TRACT | Status: DC | PRN

## 2016-05-05 MED ORDER — ACETAMINOPHEN 325 MG PO TABS: 650.0000 mg | ORAL_TABLET | ORAL | Status: DC | PRN

## 2016-05-05 MED ORDER — TIZANIDINE HCL 2 MG PO CAPS: 2.0000 mg | ORAL_CAPSULE | Freq: Three times a day (TID) | ORAL | Status: DC

## 2016-05-05 MED ORDER — SODIUM CHLORIDE 0.9 % IV SOLN: 250.0000 mL | INTRAVENOUS | Status: DC | PRN

## 2016-05-05 MED ORDER — CLONAZEPAM 0.5 MG PO TABS
0.5000 mg | ORAL_TABLET | Freq: Every evening | ORAL | Status: DC | PRN
  Administered 2016-05-05 – 2016-05-06 (×2): 0.5 mg via ORAL
  Filled 2016-05-05 (×2): qty 1

## 2016-05-05 MED ORDER — DIPHENHYDRAMINE HCL 25 MG PO CAPS
50.0000 mg | ORAL_CAPSULE | Freq: Once | ORAL | Status: AC
  Administered 2016-05-05: 50 mg via ORAL
  Filled 2016-05-05: qty 2

## 2016-05-05 MED ORDER — SIMVASTATIN 40 MG PO TABS
40.0000 mg | ORAL_TABLET | Freq: Every day | ORAL | Status: DC
  Administered 2016-05-05 – 2016-05-06 (×2): 40 mg via ORAL
  Filled 2016-05-05 (×2): qty 1

## 2016-05-05 MED ORDER — ASPIRIN EC 81 MG PO TBEC
81.0000 mg | DELAYED_RELEASE_TABLET | Freq: Every day | ORAL | Status: DC
  Administered 2016-05-06: 81 mg via ORAL
  Filled 2016-05-05: qty 1

## 2016-05-05 MED ORDER — ZOLPIDEM TARTRATE 5 MG PO TABS: 5.0000 mg | ORAL_TABLET | Freq: Every evening | ORAL | Status: DC | PRN

## 2016-05-05 MED ORDER — NITROGLYCERIN 0.4 MG SL SUBL: 0.4000 mg | SUBLINGUAL_TABLET | SUBLINGUAL | Status: DC | PRN

## 2016-05-05 MED ORDER — METOPROLOL SUCCINATE ER 25 MG PO TB24
25.0000 mg | ORAL_TABLET | Freq: Every day | ORAL | Status: DC
  Administered 2016-05-06: 25 mg via ORAL
  Filled 2016-05-05: qty 1

## 2016-05-05 MED ORDER — LISINOPRIL 10 MG PO TABS
10.0000 mg | ORAL_TABLET | Freq: Every day | ORAL | Status: DC
  Administered 2016-05-06: 10 mg via ORAL
  Filled 2016-05-05: qty 1

## 2016-05-05 MED ORDER — HEPARIN SODIUM (PORCINE) 5000 UNIT/ML IJ SOLN
5000.0000 [IU] | Freq: Three times a day (TID) | INTRAMUSCULAR | Status: DC
  Administered 2016-05-05 – 2016-05-06 (×3): 5000 [IU] via SUBCUTANEOUS
  Filled 2016-05-05 (×4): qty 1

## 2016-05-05 MED ORDER — TIZANIDINE HCL 2 MG PO TABS
2.0000 mg | ORAL_TABLET | Freq: Three times a day (TID) | ORAL | Status: DC
  Administered 2016-05-05 – 2016-05-06 (×2): 2 mg via ORAL
  Filled 2016-05-05 (×7): qty 1

## 2016-05-05 MED ORDER — ASPIRIN EC 81 MG PO TBEC
81.0000 mg | DELAYED_RELEASE_TABLET | Freq: Every day | ORAL | Status: DC
Start: 2016-05-06 — End: 2016-05-05

## 2016-05-05 MED ORDER — ONDANSETRON HCL 4 MG/2ML IJ SOLN: 4.0000 mg | Freq: Four times a day (QID) | INTRAMUSCULAR | Status: DC | PRN

## 2016-05-05 MED ORDER — SODIUM CHLORIDE 0.9% FLUSH
3.0000 mL | Freq: Two times a day (BID) | INTRAVENOUS | Status: DC
  Administered 2016-05-05 – 2016-05-06 (×2): 3 mL via INTRAVENOUS

## 2016-05-05 MED ORDER — SODIUM CHLORIDE 0.9% FLUSH: 3.0000 mL | INTRAVENOUS | Status: DC | PRN

## 2016-05-05 NOTE — H&P (Signed)
Physician History and Physical    Christian GAUTHREAUX MRN: [id number] DOB/AGE: 1945-12-20 70 y.o. Admit date: 05/05/2016  Primary Care Physician: None Primary Cardiologist: Wyline Copas MD  HPI: 70 yr old male with CAD. Saw. Dr. Wyline Copas at [facility] 12/13/15. Last cath 2010 showed mid LAD disease, CTO of circumflex and RCA with collaterals. RCA was reportedly small and non-dominant. Has a long h/o tobacco use. Reportedly has LV dysfunction, EF 45%. Also has a h/o hypertension, obesity, and hyperlipidemia.  Developed precordial chest tightness at 10 pm last night and took an ASA with symptom relief. Pain recurred and woke him up at 6 am today, took another ASA and one SL nitro with relief. Pain recurred at 11 am and he decided to come to ED  POC troponin normal.  Chest xray showed diffuse peribronchial cuffing concerning for acute bronchitis.  ECG showed sinus rhythm with LAFB and PVC's.  Lipids 12/13/15: TC 121, TG 118, HDL 27, LDL 70.  Smokes >1 ppd x several years.  Has been short of breath after walking 100 yards, progressively getting worse over past 2 years. Denies exertional chest pain.     Review of systems complete and found to be negative unless listed above   No family history of premature CAD in 1st degree relatives. Social History   Social History  . Marital Status: Widowed    Spouse Name: N/A  . Number of Children: N/A  . Years of Education: N/A   Occupational History  . Not on file.   Social History Main Topics  . Smoking status: Current Every Day Smoker -- 1.00 packs/day for 40 years    Types: Cigarettes  . Smokeless tobacco: Not on file  . Alcohol Use: Yes     Comment: 1 glass of wine per night  . Drug Use: No  . Sexual Activity: Not on file   Other Topics Concern  . Not on file   Social History Narrative      (Not in a hospital admission)  Physical Exam: Blood pressure 136/77, pulse 71, temperature 97.9 F (36.6 C), temperature source Oral, resp. rate 14,  height 6' (1.829 m), weight 295 lb (133.811 kg), SpO2 99 %.  General: NAD Neck: No JVD, no thyromegaly or thyroid nodule.  Lungs: Clear to auscultation bilaterally with normal respiratory effort. CV: Nondisplaced PMI.  Heart regular S1/S2, no S3/S4, no murmur.  No peripheral edema.  No carotid bruit.   Abdomen: Firm, morbidly obese.  Skin: Intact without lesions or rashes.  Neurologic: Alert and oriented x 3.  Psych: Normal affect. HEENT: Normal.   Labs:   Lab Results  Component Value Date   WBC 9.7 05/05/2016   HGB 13.7 05/05/2016   HCT 41.2 05/05/2016   MCV 103.0* 05/05/2016   PLT 148* 05/05/2016    Recent Labs Lab 05/05/16 1247  NA 135  K 4.1  CL 106  CO2 23  BUN 12  CREATININE 0.90  CALCIUM 9.0  GLUCOSE 162*   No results found for: CKTOTAL, CKMB, CKMBINDEX, TROPONINI Lab Results  Component Value Date   CHOL 113 07/28/2014   CHOL 135 06/02/2013   CHOL 117 05/14/2012   Lab Results  Component Value Date   HDL 32* 07/28/2014   HDL 32* 06/02/2013   HDL 31* 05/14/2012   Lab Results  Component Value Date   LDLCALC 66 07/28/2014   LDLCALC 79 06/02/2013   LDLCALC 70 05/14/2012   Lab Results  Component Value Date   TRIG 74  07/28/2014   TRIG 121 06/02/2013   TRIG 78 05/14/2012   Lab Results  Component Value Date   CHOLHDL 3.5 07/28/2014   CHOLHDL 4.2 06/02/2013   CHOLHDL 3.8 05/14/2012   No results found for: LDLDIRECT        ASSESSMENT AND PLAN:  1. Chest pain and progressive exertional dyspnea in context of CAD as noted above: Currently symptom free but has had progressive exertional dyspnea over past 2 years. Also has long h/o tobacco abuse (COPD likely). Will admit for rule out of ACS with serial serum troponins. I will order a 2-D echocardiogram with Doppler to evaluate cardiac structure, function, and regional wall motion. If troponins normal, will plan for nuclear MPI study 5/29. Continue ASA, beta blocker, and statin.  2. Essential HTN:  Controlled. No changes.  3. Hyperlipidemia: Well controlled as noted above on 12/13/15. Continue current dose of statin therapy.  4. Tobacco abuse: Cessation counseling provided (1 minute).    Signed: Kate Sable, M.D., F.A.C.C. 05/05/2016, 2:35 PM

## 2016-05-05 NOTE — ED Notes (Signed)
Pt reports onset last night pressure pain in left chest, took ASA 325 mg and pain decreased slightly, went to sleep, woke up at 6am this morning with pressure pain, took another ASA 325 mg and NTG X 1, pain decreased slightly, pt went to sleep for several hours, woke up with increased pressure pain took another NTG x 1, pain decreased slightly.  Pt stopped taking ASA 325 mg 3-4 days ago d/t bruising on arm.  NO nausea, sweating, back pain.

## 2016-05-05 NOTE — ED Provider Notes (Signed)
CSN: [id number]     Arrival date & time 05/05/16  1222 History   First MD Initiated Contact with Patient 05/05/16 1240     Chief Complaint  Patient presents with  . Chest Pain     (Consider location/radiation/quality/duration/timing/severity/associated sxs/prior Treatment) Patient is a 70 y.o. male presenting with chest pain. The history is provided by the patient.  Chest Pain Pain location:  L chest Pain quality: aching, pressure and tightness   Pain radiates to:  Does not radiate Pain radiates to the back: no   Pain severity:  Moderate Onset quality:  Sudden Duration: Started last night about 10:00 and he took an aspirin with improvement of his symptoms. The pain woke him up at 6 AM this morning he took a second aspirin and a nitroglycerin and then it returned at 11. Timing:  Intermittent Progression:  Resolved Chronicity:  Recurrent Context: at rest   Context: not eating and no movement   Relieved by:  Aspirin and nitroglycerin Worsened by:  Nothing tried Ineffective treatments:  None tried Associated symptoms: diaphoresis and shortness of breath   Associated symptoms: no abdominal pain, no anorexia, no cough, no dizziness, no fever, no nausea, not vomiting and no weakness   Risk factors: coronary artery disease, hypertension, male sex and smoking   Risk factors: no prior DVT/PE     Past Medical History  Diagnosis Date  . Hyperlipidemia   . Hypertension   . Anxiety   . Cancer ([place])     skin  . History of shingles     2004   Past Surgical History  Procedure Laterality Date  . Cardiac catheterization      2010 @ [facility] (per pt)  . Eye surgery Bilateral     cataracts  . Open reduction internal fixation (orif) distal radial fracture Right 11/26/2013    Procedure: OPEN REDUCTION INTERNAL FIXATION (ORIF) DISTAL RADIAL FRACTURE;  Surgeon: Newt Minion, MD;  Location: [place];  Service: Orthopedics;  Laterality: Right;  Open Reduction Internal Fixation Right  Distal Radius    Family History  Problem Relation Age of Onset  . Heart disease Mother   . Heart disease Father   . Heart disease Brother   . Diabetes Brother   . Kidney disease Brother   . Heart disease Sister   . Cancer Sister    Social History  Substance Use Topics  . Smoking status: Current Every Day Smoker -- 1.00 packs/day for 40 years    Types: Cigarettes  . Smokeless tobacco: None  . Alcohol Use: Yes     Comment: 1 glass of wine per night    Review of Systems  Constitutional: Positive for diaphoresis. Negative for fever.  Respiratory: Positive for shortness of breath. Negative for cough.   Cardiovascular: Positive for chest pain.  Gastrointestinal: Negative for nausea, vomiting, abdominal pain and anorexia.  Neurological: Negative for dizziness and weakness.  All other systems reviewed and are negative.     Allergies  Celebrex  Home Medications   Prior to Admission medications   Medication Sig Start Date End Date Taking? Authorizing Provider  albuterol (PROVENTIL HFA;VENTOLIN HFA) 108 (90 BASE) MCG/ACT inhaler Inhale 2 puffs into the lungs every 6 (six) hours as needed for wheezing or shortness of breath. 02/27/15   Robyn Haber, MD  aspirin 325 MG tablet Take 325 mg by mouth daily.    Historical Provider, MD  azithromycin (ZITHROMAX Z-PAK) 250 MG tablet Take as directed on pack 02/27/15  Robyn Haber, MD  clonazePAM (KLONOPIN) 0.5 MG tablet TAKE ONE TABLET BY MOUTH AT BEDTIME AS NEEDED FOR ANXIETY 11/06/15   Robyn Haber, MD  ketoconazole (NIZORAL) 2 % cream Apply 1 application topically 2 (two) times daily. 02/27/15   Robyn Haber, MD  lisinopril (PRINIVIL,ZESTRIL) 10 MG tablet Take 1 tablet (10 mg total) by mouth daily. PATIENT NEEDS OFFICE VISIT FOR ADDITIONAL REFILLS 11/18/13   Robyn Haber, MD  Melaton-Thean-Cham-PassF-LBalm (MELATONIN + L-THEANINE PO) Take 1 tablet by mouth at bedtime as needed (sleep).     Historical Provider, MD  metoprolol  succinate (TOPROL-XL) 25 MG 24 hr tablet Take 1 tablet (25 mg total) by mouth daily. NO MORE REFILLS WITHOUT OFFICE VISIT - 2ND NOTICE 11/15/15   Robyn Haber, MD  simvastatin (ZOCOR) 40 MG tablet Take 1 tablet (40 mg total) by mouth at bedtime. 07/28/14   Robyn Haber, MD  tizanidine (ZANAFLEX) 2 MG capsule Take 1 capsule (2 mg total) by mouth 3 (three) times daily. 07/28/14   Robyn Haber, MD  vitamin E 100 UNIT capsule Take 100 Units by mouth daily.    Historical Provider, MD   BP 126/67 mmHg  Pulse 69  Temp(Src) 97.9 F (36.6 C) (Oral)  Resp 20  Ht 6' (1.829 m)  Wt 295 lb (133.811 kg)  BMI 40.00 kg/m2  SpO2 97% Physical Exam  Constitutional: He is oriented to person, place, and time. He appears well-developed and well-nourished. No distress.  HENT:  Head: Normocephalic and atraumatic.  Mouth/Throat: Oropharynx is clear and moist.  Eyes: Conjunctivae and EOM are normal. Pupils are equal, round, and reactive to light.  Neck: Normal range of motion. Neck supple.  Cardiovascular: Normal rate, regular rhythm and intact distal pulses.   No murmur heard. Pulmonary/Chest: Effort normal. No respiratory distress. He has decreased breath sounds. He has no wheezes. He has no rales.  Abdominal: Soft. He exhibits no distension. There is no tenderness. There is no rebound and no guarding.  Musculoskeletal: Normal range of motion. He exhibits edema. He exhibits no tenderness.  Trace edema in bilateral lower ext  Neurological: He is alert and oriented to person, place, and time.  Skin: Skin is warm and dry. No rash noted. No erythema.  Psychiatric: He has a normal mood and affect. His behavior is normal.  Nursing note and vitals reviewed.   ED Course  Procedures (including critical care time) Labs Review Labs Reviewed  BASIC METABOLIC PANEL - Abnormal; Notable for the following:    Glucose, Bld 162 (*)    All other components within normal limits  CBC - Abnormal; Notable for the  following:    RBC 4.00 (*)    MCV 103.0 (*)    MCH 34.3 (*)    Platelets 148 (*)    All other components within normal limits  Randolm Idol, ED    Imaging Review Dg Chest 2 View  05/05/2016  CLINICAL DATA:  70 year old male with left-sided chest pressure non current shortness breath for 1 day. EXAM: CHEST  2 VIEW COMPARISON:  Chest x-ray 02/27/2015. FINDINGS: Diffuse peribronchial cuffing. Lung volumes are normal. No consolidative airspace disease. No pleural effusions. No pneumothorax. No pulmonary nodule or mass noted. Pulmonary vasculature and the cardiomediastinal silhouette are within normal limits. Atherosclerosis in the thoracic aorta. Multiple old healed right-sided rib fractures. IMPRESSION: 1. Diffuse peribronchial cuffing, concerning for an acute bronchitis. 2. Atherosclerosis. Electronically Signed   By: Vinnie Langton M.D.   On: 05/05/2016 14:00   I have  personally reviewed and evaluated these images and lab results as part of my medical decision-making.   EKG Interpretation   Date/Time:  Sunday May 05 2016 12:26:49 EDT Ventricular Rate:  73 PR Interval:  196 QRS Duration: 110 QT Interval:  390 QTC Calculation: 429 R Axis:   -34 Text Interpretation:  Sinus rhythm with occasional Premature ventricular  complexes and Possible Premature atrial complexes with Abberant conduction  Left axis deviation No significant change since last tracing Confirmed by  Maryan Rued  MD, Loree Fee ([id number]) on 05/05/2016 12:40:34 PM      MDM   Final diagnoses:  Unstable angina (HCC)    Pt with symptoms concerning for ACS with left sided chest pain and SOB.  Heart score of 6. Low risk wells. ASA and NTG taken pta and now pain resolved.  Patient denies any recent infectious symptoms, respiratory issues and symptoms are not related to eating.  Patient does have a history significant for chest pain in the past approximately 7 years ago where he sat a cardiologist in [place] and that time had  a catheterization which showed complete occlusion with collaterals. He received dose dense at that time. Last Saw cardiology 4 months ago. Has never had a stress test and no ongoing cardiac workup. Also patient continues to smoke. EKG, CXR, CBC, BMP, trop pending.  2:14 PM EKG without significant findings. Chest x-ray with diffuse peribronchial cuffing and atherosclerosis, CBC, BMP and troponin without acute findings. We'll discuss with cardiology. Patient currently remains chest pain-free.   Blanchie Dessert, MD 05/05/16 1424

## 2016-05-06 ENCOUNTER — Observation Stay (HOSPITAL_COMMUNITY): Payer: PPO

## 2016-05-06 DIAGNOSIS — E785 Hyperlipidemia, unspecified: Secondary | ICD-10-CM | POA: Diagnosis not present

## 2016-05-06 DIAGNOSIS — I110 Hypertensive heart disease with heart failure: Secondary | ICD-10-CM | POA: Diagnosis not present

## 2016-05-06 DIAGNOSIS — R079 Chest pain, unspecified: Secondary | ICD-10-CM | POA: Diagnosis not present

## 2016-05-06 DIAGNOSIS — I25700 Atherosclerosis of coronary artery bypass graft(s), unspecified, with unstable angina pectoris: Secondary | ICD-10-CM

## 2016-05-06 DIAGNOSIS — Z720 Tobacco use: Secondary | ICD-10-CM

## 2016-05-06 DIAGNOSIS — I209 Angina pectoris, unspecified: Secondary | ICD-10-CM

## 2016-05-06 LAB — TROPONIN I: Troponin I: <0.03 ng/mL (ref <0.031)

## 2016-05-06 MED ORDER — TECHNETIUM TC 99M TETROFOSMIN IV KIT
30.0000 | PACK | Freq: Once | INTRAVENOUS | Status: AC | PRN
  Administered 2016-05-06: 30 via INTRAVENOUS

## 2016-05-06 MED ORDER — REGADENOSON 0.4 MG/5ML IV SOLN
INTRAVENOUS | Status: AC
  Filled 2016-05-06: qty 5

## 2016-05-06 MED ORDER — REGADENOSON 0.4 MG/5ML IV SOLN
0.4000 mg | Freq: Once | INTRAVENOUS | Status: AC
  Administered 2016-05-06: 0.4 mg via INTRAVENOUS
  Filled 2016-05-06: qty 5

## 2016-05-06 MED ORDER — DIPHENHYDRAMINE HCL 25 MG PO CAPS
50.0000 mg | ORAL_CAPSULE | Freq: Every evening | ORAL | Status: DC | PRN
  Administered 2016-05-06: 50 mg via ORAL
  Filled 2016-05-06: qty 2

## 2016-05-06 NOTE — Progress Notes (Signed)
  Stress portion of stress test complete. 2 day study. No complications.   Christian [name] 05/06/2016

## 2016-05-06 NOTE — Progress Notes (Signed)
PATIENT ID: Christian [name] is a 65M with CAD with CTO of the RCA, COPD, hypertension, hyperlipidemia and tobacco abuse here with chest pain.  SUBJECTIVE:  No recurrent chest pain since admission.     PHYSICAL EXAM Filed Vitals:   05/05/16 1700 05/05/16 1745 05/05/16 2026 05/06/16 0500  BP: 154/79 120/74 126/85 123/59  Pulse: 80 74  71  Temp:  98.5 F (36.9 C) 98.7 F (37.1 C) 97.8 F (36.6 C)  TempSrc:  Oral Oral Oral  Resp: '18 19 18   '$ Height:  6' (1.829 m)    Weight:  133.04 kg (293 lb 4.8 oz)  131.634 kg (290 lb 3.2 oz)  SpO2: 97% 98% 97% 97%   General:  Well-appearing.  No acute distress Neck: No JVD. Lungs:  CTAB.  No crackles, rhonchi or wheezes Heart:  RRR.  No m/r/g. Abdomen:  Soft, NT, ND.  +BS Extremities: No edema.    LABS: Lab Results  Component Value Date   TROPONINI <0.03 05/06/2016   Results for orders placed or performed during the hospital encounter of 05/05/16 (from the past 24 hour(s))  Basic metabolic panel     Status: Abnormal   Collection Time: 05/05/16 12:47 PM  Result Value Ref Range   Sodium 135 135 - 145 mmol/L   Potassium 4.1 3.5 - 5.1 mmol/L   Chloride 106 101 - 111 mmol/L   CO2 23 22 - 32 mmol/L   Glucose, Bld 162 (H) 65 - 99 mg/dL   BUN 12 6 - 20 mg/dL   Creatinine, Ser 0.90 0.61 - 1.24 mg/dL   Calcium 9.0 8.9 - 10.3 mg/dL   GFR calc non Af Amer >60 >60 mL/min   GFR calc Af Amer >60 >60 mL/min   Anion gap 6 5 - 15  CBC     Status: Abnormal   Collection Time: 05/05/16 12:47 PM  Result Value Ref Range   WBC 9.7 4.0 - 10.5 K/uL   RBC 4.00 (L) 4.22 - 5.81 MIL/uL   Hemoglobin 13.7 13.0 - 17.0 g/dL   HCT 41.2 39.0 - 52.0 %   MCV 103.0 (H) 78.0 - 100.0 fL   MCH 34.3 (H) 26.0 - 34.0 pg   MCHC 33.3 30.0 - 36.0 g/dL   RDW 13.2 11.5 - 15.5 %   Platelets 148 (L) 150 - 400 K/uL  I-stat troponin, ED     Status: None   Collection Time: 05/05/16 12:58 PM  Result Value Ref Range   Troponin i, poc 0.00 0.00 - 0.08 ng/mL   Comment 3            Troponin I     Status: None   Collection Time: 05/05/16  4:58 PM  Result Value Ref Range   Troponin I <0.03 <0.031 ng/mL  Troponin I     Status: None   Collection Time: 05/05/16 10:15 PM  Result Value Ref Range   Troponin I <0.03 <0.031 ng/mL  Troponin I     Status: None   Collection Time: 05/06/16  3:33 AM  Result Value Ref Range   Troponin I <0.03 <0.031 ng/mL    Intake/Output Summary (Last 24 hours) at 05/06/16 0837 Last data filed at 05/06/16 0600  Gross per 24 hour  Intake    240 ml  Output   1400 ml  Net  -1160 ml    Telemetry:  Sinus rhythm.  PVCs.   ASSESSMENT AND PLAN:  Active Problems:   Chest pain   #  CAD, angina:  Christian [name] has known CAD and symptoms that are concerning for ischemia that developed after he stopped taking aspirin.  Stress test is ongoing at this time.  Continue aspirin and metoprolol.   # Hypertension: Blood pressure is well-controlled.  Continue lisinopril and metoprolol.    # Hyperlipidemia: Lipids last checked in 2015.  We will check fasting lipid panel.  Continue simvastatin for now.   # Tobacco abuse: Smoking cessation.   Jelani Vreeland C. Oval Linsey, MD, [facility] 05/06/2016 8:37 AM

## 2016-05-07 ENCOUNTER — Observation Stay (HOSPITAL_COMMUNITY): Payer: PPO

## 2016-05-07 ENCOUNTER — Observation Stay (HOSPITAL_BASED_OUTPATIENT_CLINIC_OR_DEPARTMENT_OTHER): Payer: PPO

## 2016-05-07 DIAGNOSIS — I25110 Atherosclerotic heart disease of native coronary artery with unstable angina pectoris: Secondary | ICD-10-CM | POA: Diagnosis not present

## 2016-05-07 DIAGNOSIS — I200 Unstable angina: Secondary | ICD-10-CM | POA: Diagnosis not present

## 2016-05-07 DIAGNOSIS — R079 Chest pain, unspecified: Secondary | ICD-10-CM | POA: Diagnosis not present

## 2016-05-07 DIAGNOSIS — I10 Essential (primary) hypertension: Secondary | ICD-10-CM | POA: Diagnosis not present

## 2016-05-07 LAB — NM MYOCAR MULTI W/SPECT W/WALL MOTION / EF
Estimated workload: 1 METS
Exercise duration (min): 5 min
MPHR: 153 {beats}/min
Peak HR: 116 {beats}/min
Percent HR: 75 %
Rest HR: 75 {beats}/min

## 2016-05-07 LAB — ECHOCARDIOGRAM COMPLETE
Height: 72 in
Weight: 4606.4 oz

## 2016-05-07 MED ORDER — TECHNETIUM TC 99M TETROFOSMIN IV KIT
30.0000 | PACK | Freq: Once | INTRAVENOUS | Status: AC | PRN
  Administered 2016-05-07: 30 via INTRAVENOUS

## 2016-05-07 MED ORDER — METOPROLOL SUCCINATE ER 50 MG PO TB24: 50.0000 mg | ORAL_TABLET | Freq: Every day | ORAL | Status: DC

## 2016-05-07 MED ORDER — ISOSORBIDE MONONITRATE ER 30 MG PO TB24: 30.0000 mg | ORAL_TABLET | Freq: Every day | ORAL | Status: DC

## 2016-05-07 MED ORDER — NITROGLYCERIN 0.4 MG SL SUBL: 0.4000 mg | SUBLINGUAL_TABLET | SUBLINGUAL | Status: AC | PRN

## 2016-05-07 NOTE — Progress Notes (Signed)
  Echocardiogram 2D Echocardiogram has been performed.  Demon Volante 05/07/2016, 11:16 AM

## 2016-05-07 NOTE — Discharge Summary (Signed)
Discharge Summary    Patient ID: Christian [name],  MRN: [id number], DOB/AGE: [birth year] 70 y.o.  Admit date: 05/05/2016 Discharge date: 05/07/2016  Primary Care Provider: Robyn Haber Primary Cardiologist: New/ Dr. Sallyanne Kuster  Discharge Diagnoses    Active Problems:   Chest pain   Allergies Allergies  Allergen Reactions  . Celebrex [Celecoxib] Itching    Diagnostic Studies/Procedures  MYOCARDIAL IMAGING WITH SPECT (REST AND PHARMACOLOGIC-STRESS - 2 DAY PROTOCOL)  IMPRESSION: 1. Reversible ischemia involving the inferolateral and inferoapical walls. Possible infarct involving the distal inferior wall.  2. Global left ventricular hypokinesis.  3. Left ventricular ejection fraction 34%  4. Non invasive risk stratification*: High risk. _____________   History of Present Illness   [name] is a 69 year old male with a past medical history of CAD, HTN, HLD, obesity and ongoing tobacco abuse. He presented to the ED on 05/05/16 with chest pain and worsening SOB. He underwent 2 day nuclear stress test.   Hospital Course  [name] presented with left sided chest pain with radiation to his left arm after stopping his aspirin. He stopped his aspirin because he noticed increased bruising on his hands. His EKG showed NSR with LAFB and PVC's. No concerns for ischemia. His troponin was negative x3.   He had a left heart cath in 2010, according to notes from Dr. Rhetta Mura office with [facility] in [place]. According to notes, patient had CTO of circumflex and RCA with collaterals, he also had some mid LAD disease. His EF at that time was 45%.    His nuclear study this admission showed reversible ischemia involving inferolateral and inferoapical walls and was considered a high risk study. This is consistent with what is known about his coronary anatomy. However, there is some concern for worsening LAD disease which may not be readily apparent on the nuclear images which show  "relative" normal flow to the anterior wall.  Patient did not wish to stay in the hospital for cath, he is adamant about leaving today.  He is open to cath in the outpatient setting. Given his known CTO of circumflex and RCA, he will need cath with Dr. Martinique or Irish Lack.   He was started on isosorbide and his beta blocker was increased. He will continue on lisinopril. Smoking cessation was encouraged.  _____________  Discharge Vitals Blood pressure 149/76, pulse 71, temperature 98.2 F (36.8 C), temperature source Oral, resp. rate 18, height 6' (1.829 m), weight 287 lb 14.4 oz (130.591 kg), SpO2 99 %.  Filed Weights   05/05/16 1745 05/06/16 0500 05/07/16 0634  Weight: 293 lb 4.8 oz (133.04 kg) 290 lb 3.2 oz (131.634 kg) 287 lb 14.4 oz (130.591 kg)    Labs & Radiologic Studies     CBC  Recent Labs  05/05/16 1247  WBC 9.7  HGB 13.7  HCT 41.2  MCV 103.0*  PLT 924*   Basic Metabolic Panel  Recent Labs  05/05/16 1247  NA 135  K 4.1  CL 106  CO2 23  GLUCOSE 162*  BUN 12  CREATININE 0.90  CALCIUM 9.0   Cardiac Enzymes  Recent Labs  05/05/16 1658 05/05/16 2215 05/06/16 0333  TROPONINI <0.03 <0.03 <0.03    Dg Chest 2 View  05/05/2016  CLINICAL DATA:  70 year old male with left-sided chest pressure non current shortness breath for 1 day. EXAM: CHEST  2 VIEW COMPARISON:  Chest x-ray 02/27/2015. FINDINGS: Diffuse peribronchial cuffing. Lung volumes are normal. No consolidative airspace  disease. No pleural effusions. No pneumothorax. No pulmonary nodule or mass noted. Pulmonary vasculature and the cardiomediastinal silhouette are within normal limits. Atherosclerosis in the thoracic aorta. Multiple old healed right-sided rib fractures. IMPRESSION: 1. Diffuse peribronchial cuffing, concerning for an acute bronchitis. 2. Atherosclerosis. Electronically Signed   By: Vinnie Langton M.D.   On: 05/05/2016 14:00   Nm Myocar Multi W/spect W/wall Motion / Ef  05/07/2016   CLINICAL DATA:  70 year old with current history of hypertension and hyperlipidemia, current smoker with COPD, family history of coronary artery disease, presenting with chest pain. EXAM: MYOCARDIAL IMAGING WITH SPECT (REST AND PHARMACOLOGIC-STRESS - 2 DAY PROTOCOL) GATED LEFT VENTRICULAR WALL MOTION STUDY LEFT VENTRICULAR EJECTION FRACTION TECHNIQUE: Standard myocardial SPECT imaging was performed after resting intravenous injection of 10 mCi Tc-20mtetrofosmin. Subsequently, on a second day, intravenous infusion of Lexiscan was performed under the supervision of the Cardiology staff. At peak effect of the drug, 30 mCi Tc-913metrofosmin was injected intravenously and standard myocardial SPECT imaging was performed. Quantitative gated imaging was also performed to evaluate left ventricular wall motion, and estimate left ventricular ejection fraction. COMPARISON:  None. FINDINGS: Perfusion: Immediate post regadenoson images demonstrate diminished activity to the inferior wall, inferolateral wall and inferoapical wall. Resting images demonstrate reversibility to the inferolateral and inferoapical wall walls and no reversibility to the distal inferior wall. Wall Motion: Global left ventricular hypokinesis without focal wall motion abnormality. Left Ventricular Ejection Fraction: 3430%nd diastolic volume 18160l End systolic volume 12109l IMPRESSION: 1. Reversible ischemia involving the inferolateral and inferoapical walls. Possible infarct involving the distal inferior wall. 2. Global left ventricular hypokinesis. 3. Left ventricular ejection fraction 34% 4. Non invasive risk stratification*: High risk. *2012 Appropriate Use Criteria for Coronary Revascularization Focused Update: J Am Coll Cardiol. 203235;57(3):220-254http://content.[email or web address]?articleid=[email or web address] Electronically Signed   By: ThEvangeline Dakin.D.   On: 05/07/2016 08:43    Disposition   Pt is being discharged home today in good  condition.  Follow-up Plans & Appointments    Follow-up Information    [facility] up with [facility] On 05/10/2016.   Specialties:  Cardiology, Radiology   Why:  at 8am for follow up.   Contact information:   [address] [place] [phone]    Discharge Instructions    Diet - low sodium heart healthy    Complete by:  As directed      Increase activity slowly    Complete by:  As directed            Discharge Medications   Current Discharge Medication List    START taking these medications   Details  isosorbide mononitrate (IMDUR) 30 MG 24 hr tablet Take 1 tablet (30 mg total) by mouth daily. Qty: 30 tablet, Refills: 12    nitroGLYCERIN (NITROSTAT) 0.4 MG SL tablet Place 1 tablet (0.4 mg total) under the tongue every 5 (five) minutes as needed for chest pain. Qty: 25 tablet, Refills: 1      CONTINUE these medications which have CHANGED   Details  metoprolol succinate (TOPROL-XL) 50 MG 24 hr tablet Take 1 tablet (50 mg total) by mouth daily. Take with or immediately following a meal. Qty: 30 tablet, Refills: 12      CONTINUE these medications which have NOT CHANGED   Details  aspirin 325 MG tablet Take 325 mg by mouth daily.    clonazePAM (KLONOPIN) 0.5 MG tablet TAKE ONE TABLET BY MOUTH AT BEDTIME AS  NEEDED FOR ANXIETY Qty: 30 tablet, Refills: 0    lisinopril (PRINIVIL,ZESTRIL) 10 MG tablet Take 1 tablet (10 mg total) by mouth daily. PATIENT NEEDS OFFICE VISIT FOR ADDITIONAL REFILLS Qty: 30 tablet, Refills: 0    simvastatin (ZOCOR) 40 MG tablet Take 1 tablet (40 mg total) by mouth at bedtime. Qty: 90 tablet, Refills: 3    tizanidine (ZANAFLEX) 2 MG capsule Take 1 capsule (2 mg total) by mouth 3 (three) times daily. Qty: 270 capsule, Refills: 3    albuterol (PROVENTIL HFA;VENTOLIN HFA) 108 (90 BASE) MCG/ACT inhaler Inhale 2 puffs into the lungs every 6 (six) hours as needed for wheezing or shortness of breath. Qty: 1 Inhaler, Refills:  3   Associated Diagnoses: Cough; Wheezing    ketoconazole (NIZORAL) 2 % cream Apply 1 application topically 2 (two) times daily. Qty: 60 g, Refills: 1   Associated Diagnoses: Rash and nonspecific skin eruption    Melaton-Thean-Cham-PassF-LBalm (MELATONIN + L-THEANINE PO) Take 1 tablet by mouth at bedtime as needed (sleep).     vitamin E 100 UNIT capsule Take 100 Units by mouth daily.      STOP taking these medications     azithromycin (ZITHROMAX Z-PAK) 250 MG tablet          Aspirin prescribed at discharge? Yes High Intensity Statin Prescribed?Yes Beta Blocker Prescribed? Yes For EF 45% or less, Was ACEI/ARB Prescribed? Yes ADP Receptor Inhibitor Prescribed? No For EF <40%, Aldosterone Inhibitor Prescribed?No Was EF assessed during THIS hospitalization? Yes Was Cardiac Rehab II ordered? (Included Medically managed Patients):No   Outstanding Labs/Studies    Duration of Discharge Encounter   Greater than 30 minutes including physician time.  Signed, Arbutus Leas NP 05/07/2016, 12:28 PM

## 2016-05-07 NOTE — Progress Notes (Signed)
Patient Name: Christian [name] Date of Encounter: 05/07/2016  Primary Cardiologist: Dr. Raenette Rover in [facility]  Patient Profile: 70 year old male with a past medical history of CAD, tobacco abuse, HTN, HLD, obesity. Presented with chest pain on 05/05/16 and progressive worsening SOB over the past 2 years. Last cath was in 2010, showed mid LAD disease, CTO of circumflex and RCA with collaterals. RCA was reportedly small and non-dominant.   SUBJECTIVE: feels well, wants to go home. No chest pain or SOB overnight.    OBJECTIVE Filed Vitals:   05/06/16 0910 05/06/16 1459 05/06/16 2122 05/07/16 0634  BP: 158/91 157/75 168/87 149/76  Pulse:  72 81 71  Temp:  98.6 F (37 C) 99.1 F (37.3 C) 98.2 F (36.8 C)  TempSrc:  Oral Oral Oral  Resp:  18    Height:      Weight:    287 lb 14.4 oz (130.591 kg)  SpO2:  96% 97% 99%    Intake/Output Summary (Last 24 hours) at 05/07/16 0759 Last data filed at 05/06/16 2000  Gross per 24 hour  Intake    240 ml  Output      0 ml  Net    240 ml   Filed Weights   05/05/16 1745 05/06/16 0500 05/07/16 0634  Weight: 293 lb 4.8 oz (133.04 kg) 290 lb 3.2 oz (131.634 kg) 287 lb 14.4 oz (130.591 kg)    PHYSICAL EXAM General: Well developed, well nourished, male in no acute distress. Head: Normocephalic, atraumatic.  Neck: Supple without bruits, No JVD. Lungs:  Resp regular and unlabored, CTA in upper lobes, diminished in lower lobes.  Heart: RRR, S1, S2, no S3, S4, or murmur; no rub. Abdomen: Soft, non-tender, non-distended, BS + x 4.  Extremities: No clubbing, cyanosis, No edema.  Neuro: Alert and oriented X 3. Moves all extremities spontaneously. Psych: Normal affect.  LABS: CBC: Recent Labs  05/05/16 1247  WBC 9.7  HGB 13.7  HCT 41.2  MCV 103.0*  PLT 841*   Basic Metabolic Panel: Recent Labs  05/05/16 1247  NA 135  K 4.1  CL 106  CO2 23  GLUCOSE 162*  BUN 12  CREATININE 0.90  CALCIUM 9.0   Cardiac Enzymes: Recent Labs  05/05/16 1658 05/05/16 2215 05/06/16 0333  TROPONINI <0.03 <0.03 <0.03    Recent Labs  05/05/16 1258  TROPIPOC 0.00     Current facility-administered medications:  .  0.9 %  sodium chloride infusion, 250 mL, Intravenous, PRN, Erlene Quan, PA-C .  acetaminophen (TYLENOL) tablet 650 mg, 650 mg, Oral, Q4H PRN, Luke K Kilroy, PA-C .  albuterol (PROVENTIL) (2.5 MG/3ML) 0.083% nebulizer solution 2.5 mg, 2.5 mg, Nebulization, Q4H PRN, Herminio Commons, MD .  aspirin EC tablet 81 mg, 81 mg, Oral, Daily, Herminio Commons, MD, 81 mg at 05/06/16 2124 .  clonazePAM (KLONOPIN) tablet 0.5 mg, 0.5 mg, Oral, QHS PRN, Erlene Quan, PA-C, 0.5 mg at 05/06/16 2124 .  diphenhydrAMINE (BENADRYL) capsule 50 mg, 50 mg, Oral, QHS PRN, Alphia Moh, MD, 50 mg at 05/06/16 2124 .  heparin injection 5,000 Units, 5,000 Units, Subcutaneous, Q8H, [employer], PA-C, 5,000 Units at 05/06/16 2124 .  lisinopril (PRINIVIL,ZESTRIL) tablet 10 mg, 10 mg, Oral, Daily, Doreene Burke Kilroy, PA-C, 10 mg at 05/06/16 2124 .  metoprolol succinate (TOPROL-XL) 24 hr tablet 25 mg, 25 mg, Oral, Daily, Doreene Burke Kilroy, PA-C, 25 mg at 05/06/16 2125 .  nitroGLYCERIN (NITROSTAT) SL  tablet 0.4 mg, 0.4 mg, Sublingual, Q5 Min x 3 PRN, Luke K Kilroy, PA-C .  ondansetron [facility]) injection 4 mg, 4 mg, Intravenous, Q6H PRN, Erlene Quan, PA-C .  simvastatin (ZOCOR) tablet 40 mg, 40 mg, Oral, QHS, Erlene Quan, PA-C, 40 mg at 05/06/16 2125 .  sodium chloride flush (NS) 0.9 % injection 3 mL, 3 mL, Intravenous, Q12H, [employer], PA-C, 3 mL at 05/06/16 2200 .  sodium chloride flush (NS) 0.9 % injection 3 mL, 3 mL, Intravenous, PRN, Doreene Burke Kilroy, PA-C .  tiZANidine (ZANAFLEX) tablet 2 mg, 2 mg, Oral, TID, Herminio Commons, MD, 2 mg at 05/06/16 2124    TELE: NSR       ECG: NSR with PAC and PVC's.   Radiology/Studies: Dg Chest 2 View  05/05/2016  CLINICAL DATA:  69 year old male with left-sided chest pressure non current  shortness breath for 1 day. EXAM: CHEST  2 VIEW COMPARISON:  Chest x-ray 02/27/2015. FINDINGS: Diffuse peribronchial cuffing. Lung volumes are normal. No consolidative airspace disease. No pleural effusions. No pneumothorax. No pulmonary nodule or mass noted. Pulmonary vasculature and the cardiomediastinal silhouette are within normal limits. Atherosclerosis in the thoracic aorta. Multiple old healed right-sided rib fractures. IMPRESSION: 1. Diffuse peribronchial cuffing, concerning for an acute bronchitis. 2. Atherosclerosis. Electronically Signed   By: Vinnie Langton M.D.   On: 05/05/2016 14:00     Current Medications:  . aspirin EC  81 mg Oral Daily  . heparin  5,000 Units Subcutaneous Q8H  . lisinopril  10 mg Oral Daily  . metoprolol succinate  25 mg Oral Daily  . simvastatin  40 mg Oral QHS  . sodium chloride flush  3 mL Intravenous Q12H  . tiZANidine  2 mg Oral TID      ASSESSMENT AND PLAN: Active Problems:   Chest pain  1. Angina and history of CAD: Patient has a history of CAD with last cath in 2010 that showed mid LAD disease, CTO of circumflex and RCA with collaterals. RCA was reportedly small and non-dominant. He presented with chest pain that awakened him from sleep on 05/05/16, it was relieved by 1 SL Nitro. Troponin negative.  Results of [place] study shows reversible ischemia involving the inferolateral and inferoapical walls with possible infarct involving the distal inferior wall- high risk. He was recently seen by [facility] care cardiologist in Jan. 2017, his EF at that time was noted to be 45%, Echo this admission is pending. At the time of his visit in January he did not want any invasive procedures as he was taking care of his very ill wife who has since passed.   He is beta blocker and ASA.   2. HTN: Patient with BP's of 150-160/80-90. He is currently on ACE and beta blocker. HR is stable in 70-80's. Can increase metoprolol XL to '50mg'$  daily for better BP control.    3. HLD: Last lipids checked in January of this year, LDL was 70, HDL was 27. Continue high intensity statin.   4. Tobacco abuse: Patient continues to smoke, according to nursing, smoking in the room last night. Smoking cessation was encouraged.     Signed, Arbutus Leas , NP 7:59 AM 05/07/2016 Pager [phone]  I have seen and examined the patient along with Arbutus Leas , NP.  I have reviewed the chart, notes and new data.  I agree with NP's note.  Key new complaints: no angina since admission 3 days ago. Has chronic  dyspnea 100 yards Key examination changes: no overt hypervolemia by exam Key new findings / data: ECG shows NSR, LVH, prominent repol changes similar to old tracing; cardiac enzymes do not suggest a high risk acute coronary event; the nuclear scan shows ischemia and some scare in the entire LCX and RCA distribution, the echo shows hypokinesia in the same territory, the EF is 34% on nuclear scan, 40-45% on echo (my preliminary interpretation of echo images).  PLAN: He had transient unstable angina. The ecg, echo and nuclear scan are all compatible with his known coronary anatomy (occluded RCA and LCX by cath 6-7 years ago with Dr. Wyline Copas in [facility]), although the actual cath report and images are not available for review. It is possible that his angina was due to medication noncompliance (he stopped ASA due to bruising and is uncertain whether he was taking the metoprolol). I am concerned that the decline in EF (is it really new?) may be due to new disease in the LAD artery, which presumably provides the collaterals to the occluded vessels. The LAD stenosis may not be readily apparent on the nuclear images which show "relative" normal flow to the anterior wall. I have recommended that he have another cardiac cath. He is adamant about leaving today. Since he has been asymptomatic over the weekend and the ECG and enzymes do not show high risk features, it is not unreasonable to  perform elective outpatient cath. Will double his beta blocker dose, reinforce need for ASA, add long acting nitrates, continue lisinopril and statin. Bring back for office visit in a few days (preferably this week) with plans to arrange outpatient cath - preferably with Dr. Martinique or Irish Lack, since we may need to decide between CTO-PCI of LCX versus CABG. He understands the risk of discharge. He needs to promptly return to the ED for recurrent symptoms not relieved by SL NTG. Smoking cessation repeatedly recommended.  Sanda Klein, MD, [place] [phone] 05/07/2016, 11:46 AM

## 2016-05-07 NOTE — Discharge Instructions (Signed)
Angina Pectoris Angina pectoris is a very bad feeling in the chest, neck, or arm. Your doctor may call it angina. There are four types of angina. Angina is caused by a lack of blood in the middle and thickest layer of the heart wall (myocardium). Angina may feel like a crushing or squeezing pain in the chest. It may feel like tightness or heavy pressure in the chest. Some people say it feels like gas, heartburn, or indigestion. Some people have symptoms other than pain. These include:  Shortness of breath.  Cold sweats.  Feeling sick to your stomach (nausea).  Feeling light-headed. Many women have chest discomfort and some of the other symptoms. However, women often have different symptoms, such as:  Feeling tired (fatigue).  Feeling nervous for no reason.  Feeling weak for no reason.  Dizziness or fainting. Women may have angina without any symptoms. HOME CARE  Take medicines only as told by your doctor.  Take care of other health issues as told by your doctor. These include:  High blood pressure (hypertension).  Diabetes.  Follow a heart-healthy diet. Your doctor can help you to choose healthy food options and make changes.  Talk to your doctor to learn more about healthy cooking methods and use them. These include:  Roasting.  Grilling.  Broiling.  Baking.  Poaching.  Steaming.  Stir-frying.  Follow an exercise program approved by your doctor.  Keep a healthy weight. Lose weight as told by your doctor.  Rest when you are tired.  Learn to manage stress.  Do not use any tobacco, such as cigarettes, chewing tobacco, or electronic cigarettes. If you need help quitting, ask your doctor.  If you drink alcohol, and your doctor says it is okay, limit yourself to no more than 1 drink per day. One drink equals 12 ounces of beer, 5 ounces of wine, or 1 ounces of hard liquor.  Stop illegal drug use.  Keep all follow-up visits as told by your doctor. This is  important. Do not take these medicines unless your doctor says that you can:  Nonsteroidal anti-inflammatory drugs (NSAIDs). These include:  Ibuprofen.  Naproxen.  Celecoxib.  Vitamin supplements that have vitamin A, vitamin E, or both.  Hormone therapy that contains estrogen with or without progestin. GET HELP RIGHT AWAY IF:  You have pain in your chest, neck, arm, jaw, stomach, or back that:  Lasts more than a few minutes.  Comes back.  Does not get better after you take medicine under your tongue (sublingual nitroglycerin).  You have any of these symptoms for no reason:  Gas, heartburn, or indigestion.  Sweating a lot.  Shortness of breath or trouble breathing.  Feeling sick to your stomach or throwing up.  Feeling more tired than usual.  Feeling nervous or worrying more than usual.  Feeling weak.  Diarrhea.  You are suddenly dizzy or light-headed.  You faint or pass out. These symptoms may be an emergency. Do not wait to see if the symptoms will go away. Get medical help right away. Call your local emergency services (911 in the [place] Do not drive yourself to the hospital.   This information is not intended to replace advice given to you by your health care provider. Make sure you discuss any questions you have with your health care provider.   Document Released: 05/13/2008 Document Revised: 04/11/2015 Document Reviewed: 03/29/2014 Elsevier Interactive Patient Education [employer].

## 2016-05-07 NOTE — Care Management Obs Status (Signed)
[facility] NOTIFICATION   Patient Details  Name: AHMERE HEMENWAY MRN: [id number] Date of Birth: [birth year]   Medicare Observation Status Notification Given:  Yes    Bethena Roys, RN 05/07/2016, 12:38 PM

## 2016-05-10 ENCOUNTER — Encounter: Payer: PPO | Admitting: Physician Assistant

## 2016-05-16 ENCOUNTER — Encounter: Payer: Medicare HMO | Admitting: Family Medicine

## 2016-07-11 ENCOUNTER — Encounter: Payer: Self-pay | Admitting: Family Medicine

## 2016-07-11 ENCOUNTER — Ambulatory Visit (INDEPENDENT_AMBULATORY_CARE_PROVIDER_SITE_OTHER): Payer: PPO | Admitting: Family Medicine

## 2016-07-11 VITALS — BP 118/62 | HR 75 | Temp 98.3°F | Resp 16 | Ht 72.0 in | Wt 287.0 lb

## 2016-07-11 DIAGNOSIS — R739 Hyperglycemia, unspecified: Secondary | ICD-10-CM | POA: Diagnosis not present

## 2016-07-11 DIAGNOSIS — G4733 Obstructive sleep apnea (adult) (pediatric): Secondary | ICD-10-CM | POA: Diagnosis not present

## 2016-07-11 DIAGNOSIS — Z9989 Dependence on other enabling machines and devices: Secondary | ICD-10-CM

## 2016-07-11 DIAGNOSIS — R21 Rash and other nonspecific skin eruption: Secondary | ICD-10-CM

## 2016-07-11 DIAGNOSIS — I10 Essential (primary) hypertension: Secondary | ICD-10-CM | POA: Diagnosis not present

## 2016-07-11 DIAGNOSIS — G4700 Insomnia, unspecified: Secondary | ICD-10-CM

## 2016-07-11 DIAGNOSIS — M545 Low back pain, unspecified: Secondary | ICD-10-CM

## 2016-07-11 DIAGNOSIS — Z79899 Other long term (current) drug therapy: Secondary | ICD-10-CM | POA: Diagnosis not present

## 2016-07-11 DIAGNOSIS — I25110 Atherosclerotic heart disease of native coronary artery with unstable angina pectoris: Secondary | ICD-10-CM

## 2016-07-11 MED ORDER — CLONAZEPAM 0.5 MG PO TABS: ORAL_TABLET | ORAL | 0 refills | Status: DC

## 2016-07-11 MED ORDER — TIZANIDINE HCL 2 MG PO CAPS: 4.0000 mg | ORAL_CAPSULE | Freq: Every evening | ORAL | 1 refills | Status: DC | PRN

## 2016-07-11 MED ORDER — KETOCONAZOLE 2 % EX CREA: 1.0000 "application " | TOPICAL_CREAM | Freq: Two times a day (BID) | CUTANEOUS | 1 refills | Status: DC

## 2016-07-11 NOTE — Progress Notes (Signed)
By signing my name below, I, Mesha Guinyard, attest that this documentation has been prepared under the direction and in the presence of Merri Ray, MD.  Electronically Signed: Verlee Monte, Medical Scribe. 07/11/16. 2:20 PM.  Subjective:    Patient ID: Christian [name], male    DOB: May 04, 1946, 70 y.o.   MRN: [id number]  HPI Chief Complaint  Patient presents with  . Annual Exam    nizoral and zanaflex, supplies for CPAP    HPI Comments: Christian [name] is a 70 y.o. male who presents to the [facility] for his annual physical as well as medication refill. He is a new pt to me and previous pt of Dr. Joseph Art, but has not been seen by him since March 2016. He was originally scheduled for a CPE today, but with recent hospitalization, we will only be going over medications and will reschedule his CPE.  Hyperglycemia: Elevated reading in recent hospitalization. Lab Results  Component Value Date   HGBA1C 6.2 02/27/2015   HTN: Takes Toprol-XL 50 mg daily, Lisinopril 10 mg QD Lab Results  Component Value Date   CREATININE 0.90 05/05/2016   CAD: Looks like he was recently discharged from the hospital- was admitted for chest pain. He was advised to remain in the hospital for cardiac catheterization, but left prior to that being pefrormed. Planned for outpatient cath. He was started on isosorbide and his beta blocker was increased. He had a nuclear stress test that was a high risk study-concerned for worsening LAD disease. Plan for follow up with Dr. [place] or Dr. Irish Lack. He had an appt with cardiology on June 2nd that he cancelled.  Pt wants to get his cath done in [facility] with Dr. Sarina Ser, but doesn't have an appointment set up yet. Pt has SOB after he walks a mile, and tends to the garden but correlates this to his COPD. Pt sits down for relief of his symptoms. Pt denies having chest pain since his last hospital visit.  Nizoral Refill: Pt uses it for the red  patches between his skin folds.  Chronic Low Back Pain: Request refill on Zanaflex. Pt takes 2 capsules at night at night to help him sleep. Pt has been having midline muscle spasms, and it doesn't radiate to his legs now that he's changed his mattress.  OSA on CPAP: Requesting supplies on CPAP because his supplies are outdated. Pt still uses it every night. Pt mentions he has a hard time keeping his CPAP machine on his face.Last sleep study was approx 10-12 years ago, he refuses a repeat sleep study or referral sleep specialist for new sleep setting as required by company that provides equipment. Pt plans on going on internet to buy equipment.  HLD: Takes Zocor 40 mg QHS. No new side effects.  Lab Results  Component Value Date   ALT 13 02/27/2015   AST 14 02/27/2015   ALKPHOS 72 02/27/2015   BILITOT 0.4 02/27/2015   Lab Results  Component Value Date   CHOL 113 07/28/2014   HDL 32 (L) 07/28/2014   LDLCALC 66 07/28/2014   TRIG 74 07/28/2014   CHOLHDL 3.5 07/28/2014   Anxiety: Takes Klonopin QHS to help him sleep. Pt mentions he takes it to calm him down.  Trouble Sleeping: Pt combines Klonopin, and Zanaflex to help him sleep at night. Pt mentions he drinks coffee all day, and drinks coffee before he goes to bed. Pt is not willing to stop combining his  medications, and he's not willing to cut back on his coffee because he likes the taste, even after discussion of caffeine affecting his sleep onset.  Pt mentions he's tried drinking decaf but he doesn't like the taste.  Depression screen [facility] 2/9 07/11/2016 07/28/2014  Decreased Interest 0 0  Down, Depressed, Hopeless 0 0  PHQ - 2 Score 0 0   Patient Active Problem List   Diagnosis Date Noted  . Chest pain 05/05/2016  . LV dysfunction 12/13/2015  . CAD (coronary artery disease) 12/12/2015  . History of cardiac cath 12/12/2015  . Obstructive chronic bronchitis without exacerbation ([place]) 07/28/2014  . Anxiety state, unspecified 07/28/2014    . Essential hypertension, benign 07/28/2014  . Hyperlipidemia 07/28/2014  . Chronic low back pain 07/28/2014   Past Medical History:  Diagnosis Date  . Anxiety   . Cancer ([place])    skin  . History of shingles    2004  . Hyperlipidemia   . Hypertension    Past Surgical History:  Procedure Laterality Date  . CARDIAC CATHETERIZATION     2010 @ [facility] (per pt)  . EYE SURGERY Bilateral    cataracts  . OPEN REDUCTION INTERNAL FIXATION (ORIF) DISTAL RADIAL FRACTURE Right 11/26/2013   Procedure: OPEN REDUCTION INTERNAL FIXATION (ORIF) DISTAL RADIAL FRACTURE;  Surgeon: Newt Minion, MD;  Location: [place];  Service: Orthopedics;  Laterality: Right;  Open Reduction Internal Fixation Right Distal Radius    Allergies  Allergen Reactions  . Celebrex [Celecoxib] Itching   Prior to Admission medications   Medication Sig Start Date End Date Taking? Authorizing Provider  albuterol (PROVENTIL HFA;VENTOLIN HFA) 108 (90 BASE) MCG/ACT inhaler Inhale 2 puffs into the lungs every 6 (six) hours as needed for wheezing or shortness of breath. 02/27/15   Robyn Haber, MD  aspirin 325 MG tablet Take 325 mg by mouth daily.    Historical Provider, MD  clonazePAM (KLONOPIN) 0.5 MG tablet TAKE ONE TABLET BY MOUTH AT BEDTIME AS NEEDED FOR ANXIETY 11/06/15   Robyn Haber, MD  isosorbide mononitrate (IMDUR) 30 MG 24 hr tablet Take 1 tablet (30 mg total) by mouth daily. 05/07/16   Arbutus Leas, NP  ketoconazole (NIZORAL) 2 % cream Apply 1 application topically 2 (two) times daily. Patient not taking: Reported on 05/05/2016 02/27/15   Robyn Haber, MD  lisinopril (PRINIVIL,ZESTRIL) 10 MG tablet Take 1 tablet (10 mg total) by mouth daily. PATIENT NEEDS OFFICE VISIT FOR ADDITIONAL REFILLS 11/18/13   Robyn Haber, MD  Melaton-Thean-Cham-PassF-LBalm (MELATONIN + L-THEANINE PO) Take 1 tablet by mouth at bedtime as needed (sleep).     Historical Provider, MD  metoprolol succinate (TOPROL-XL) 50 MG 24 hr  tablet Take 1 tablet (50 mg total) by mouth daily. Take with or immediately following a meal. 05/08/16   Arbutus Leas, NP  nitroGLYCERIN (NITROSTAT) 0.4 MG SL tablet Place 1 tablet (0.4 mg total) under the tongue every 5 (five) minutes as needed for chest pain. 05/07/16   Arbutus Leas, NP  simvastatin (ZOCOR) 40 MG tablet Take 1 tablet (40 mg total) by mouth at bedtime. 07/28/14   Robyn Haber, MD  tizanidine (ZANAFLEX) 2 MG capsule Take 1 capsule (2 mg total) by mouth 3 (three) times daily. 07/28/14   Robyn Haber, MD  vitamin E 100 UNIT capsule Take 100 Units by mouth daily.    Historical Provider, MD   Social History   Social History  . Marital status: Widowed    Spouse  name: N/A  . Number of children: N/A  . Years of education: N/A   Occupational History  . Not on file.   Social History Main Topics  . Smoking status: Current Every Day Smoker    Packs/day: 1.00    Years: 40.00    Types: Cigarettes  . Smokeless tobacco: Not on file  . Alcohol use Yes     Comment: 1 glass of wine per night  . Drug use: No  . Sexual activity: Not on file   Other Topics Concern  . Not on file   Social History Narrative  . No narrative on file   Review of Systems  Respiratory: Positive for shortness of breath. Negative for cough and chest tightness.   Cardiovascular: Negative for chest pain and leg swelling.  Gastrointestinal: Negative for abdominal pain.  Musculoskeletal: Positive for back pain.  Skin: Positive for rash.  Neurological: Negative for dizziness, light-headedness and headaches.  Psychiatric/Behavioral: Positive for sleep disturbance.   Objective:  Physical Exam  Constitutional: He is oriented to person, place, and time. He appears well-developed and well-nourished. No distress.  HENT:  Head: Normocephalic and atraumatic.  Eyes: Conjunctivae and EOM are normal. Pupils are equal, round, and reactive to light.  Neck: Neck supple. No JVD present. Carotid bruit is not  present.  Cardiovascular: Normal rate, regular rhythm and normal heart sounds.   No murmur heard. Pulmonary/Chest: Effort normal and breath sounds normal. He has no rales.  Musculoskeletal: He exhibits no edema.  Neurological: He is alert and oriented to person, place, and time.  Skin: Skin is warm and dry.  Psychiatric: He has a normal mood and affect. His behavior is normal.  Nursing note and vitals reviewed.  BP 118/62 (BP Location: Left Arm, Patient Position: Sitting, Cuff Size: Large)   Pulse 75   Temp 98.3 F (36.8 C) (Oral)   Resp 16   Ht 6' (1.829 m)   Wt 287 lb (130.2 kg)   BMI 38.92 kg/m   Assessment & Plan:   Christian [name] is a 70 y.o. male Coronary artery disease involving native coronary artery of native heart with unstable angina pectoris ([place])  - Discussed concerns of his recent hospitalization and recommendation for cardiac cath, as well as his symptoms may be unstable angina. Risk of progression to MI or possible death discussed. Advised need for follow-up with cardiology for catheterization as planned. Understanding expressed.  Rash and nonspecific skin eruption - Plan: ketoconazole (NIZORAL) 2 % cream  -Candida intertrigo likely. Refilled Nizoral. RTC if not improving with use of cream.  OSA on CPAP - Plan: CANCELED: Ambulatory referral to Sleep Studies Insomnia - Plan: clonazePAM (KLONOPIN) 0.5 MG tablet  -Discussed concerns of benzodiazepine use, including addiction potential, sedation, and respiratory depression combined with his muscle relaxant. Also discussed need to cut back on caffeine, as this was working against his sleep, and likely a reason he was requiring medications at night. He refused to cut back on caffeine. I did agree to write for 1 more month of Klonopin for him to taper, but discussed this would not be a long-term treatment as long as he continued caffeine intake in the evening.  -Discussed need for sleep study/sleep specialist follow-up as  needed for new supplies as well as to make sure he is on correct treatment. He refused this, and stated he would pick up supplies on the Internet. Risks of incomplete treatment of sleep apnea discussed, and possible reason for his difficulty with sleep.  Also discussed potential cardiac risks with untreated sleep apnea. Understanding expressed.   Midline low back pain without sciatica - Plan: tizanidine (ZANAFLEX) 2 MG capsule  -Chronic low back pain. Episodic Zanaflex okay, follow-up if persistent need or worsening symptoms. Consider physical therapy.  High risk medication use  -As above, discussed risks of combining sedating medications.  Essential hypertension - Plan: COMPLETE METABOLIC PANEL WITH GFR, Lipid panel  -Stable in office. No mention changes at present. CMP and lipid panel pending.  Hyperglycemia - Plan: Hemoglobin A1C  -Prediabetes. Weight loss, diet discussed. Exercise at the discretion of his cardiologist.  Meds ordered this encounter  Medications  . ketoconazole (NIZORAL) 2 % cream    Sig: Apply 1 application topically 2 (two) times daily.    Dispense:  60 g    Refill:  1  . tizanidine (ZANAFLEX) 2 MG capsule    Sig: Take 2 capsules (4 mg total) by mouth at bedtime as needed for muscle spasms.    Dispense:  60 capsule    Refill:  1  . clonazePAM (KLONOPIN) 0.5 MG tablet    Sig: TAKE ONE TABLET BY MOUTH AT BEDTIME AS NEEDED FOR ANXIETY.    Dispense:  30 tablet    Refill:  0   Patient Instructions   Your shortness of breath with exertion may be due to unstable angina or your heart as we discussed. It is very important you call your cardiologist today or tomorrow to schedule follow-up and cardiac catheterization as was planned when you were in the hospital in May. If you have any increased chest pain, tightness, or worsening shortness of breath, call 911 or go to the emergency room.  As we discussed, I have concerns with you taking both tizanidine and Klonopin at  bedtime. I would like you to try only one or the other to help you get to sleep. Additionally with your caffeine intake in the afternoon and evening, this is likely a big cause of your trouble getting to sleep. Do not drink caffeinated beverages after lunch, and see other information on insomnia below. I did temporarily refill the Klonopin, but if you continue to drink caffeinated beverages throughout the afternoon and evening, I'm not sure I will be able to prescribe a medicine for sleep at night in the future.  Nizoral cream if needed for rash in groin, but if this does not help within a week, or continues to spread, return for recheck.  I will refer you for sleep study as [employer] knees and up-to-date study/evaluation prior to any new equipment.  You had prediabetes based on previous blood work, will recheck that level today, and I included information below on prediabetes and diet. Follow-up with me in 3 months to recheck this level.  Follow-up with me within the next few months for physical, as we were unable to complete that today with other more pressing concerns. If any other acute issues prior to the physical, return for a visit with me prior to that appointment.  Prediabetes Eating Plan Prediabetes--also called impaired glucose tolerance or impaired fasting glucose--is a condition that causes blood sugar (blood glucose) levels to be higher than normal. Following a healthy diet can help to keep prediabetes under control. It can also help to lower the risk of type 2 diabetes and heart disease, which are increased in people who have prediabetes. Along with regular exercise, a healthy diet:  Promotes weight loss.  Helps to control blood sugar levels.  Helps to improve the  way that the body uses insulin. WHAT DO I NEED TO KNOW ABOUT THIS EATING PLAN?  Use the glycemic index (GI) to plan your meals. The index tells you how quickly a food will raise your blood sugar. Choose low-GI foods. These  foods take a longer time to raise blood sugar.  Pay close attention to the amount of carbohydrates in the food that you eat. Carbohydrates increase blood sugar levels.  Keep track of how many calories you take in. Eating the right amount of calories will help you to achieve a healthy weight. Losing about 7 percent of your starting weight can help to prevent type 2 diabetes.  You may want to follow a Mediterranean diet. This diet includes a lot of vegetables, lean meats or fish, whole grains, fruits, and healthy oils and fats. WHAT FOODS CAN I EAT? Grains Whole grains, such as whole-wheat or whole-grain breads, crackers, cereals, and pasta. Unsweetened oatmeal. Bulgur. Barley. Quinoa. Brown rice. Corn or whole-wheat flour tortillas or taco shells. Vegetables Lettuce. Spinach. Peas. Beets. Cauliflower. Cabbage. Broccoli. Carrots. Tomatoes. Squash. Eggplant. Herbs. Peppers. Onions. Cucumbers. Brussels sprouts. Fruits Berries. Bananas. Apples. Oranges. Grapes. Papaya. Mango. Pomegranate. Kiwi. Grapefruit. Cherries. Meats and Other Protein Sources Seafood. Lean meats, such as chicken and [place] or lean cuts of pork and beef. Tofu. Eggs. Nuts. Beans. Dairy Low-fat or fat-free dairy products, such as yogurt, cottage cheese, and cheese. Beverages Water. Tea. Coffee. Sugar-free or diet soda. Seltzer water. Milk. Milk alternatives, such as soy or almond milk. Condiments Mustard. Relish. Low-fat, low-sugar ketchup. Low-fat, low-sugar barbecue sauce. Low-fat or fat-free mayonnaise. Sweets and Desserts Sugar-free or low-fat pudding. Sugar-free or low-fat ice cream and other frozen treats. Fats and Oils Avocado. Walnuts. Olive oil. The items listed above may not be a complete list of recommended foods or beverages. Contact your dietitian for more options.  WHAT FOODS ARE NOT RECOMMENDED? Grains Refined white flour and flour products, such as bread, pasta, snack foods, and  cereals. Beverages Sweetened drinks, such as sweet iced tea and soda. Sweets and Desserts Baked goods, such as cake, cupcakes, pastries, cookies, and cheesecake. The items listed above may not be a complete list of foods and beverages to avoid. Contact your dietitian for more information.   This information is not intended to replace advice given to you by your health care provider. Make sure you discuss any questions you have with your health care provider.   Document Released: 04/11/2015 Document Reviewed: 04/11/2015 Elsevier Interactive Patient Education 2016 [employer].   Insomnia Insomnia is a sleep disorder that makes it difficult to fall asleep or to stay asleep. Insomnia can cause tiredness (fatigue), low energy, difficulty concentrating, mood swings, and poor performance at work or school.  There are three different ways to classify insomnia:  Difficulty falling asleep.  Difficulty staying asleep.  Waking up too early in the morning. Any type of insomnia can be long-term (chronic) or short-term (acute). Both are common. Short-term insomnia usually lasts for three months or less. Chronic insomnia occurs at least three times a week for longer than three months. CAUSES  Insomnia may be caused by another condition, situation, or substance, such as:  Anxiety.  Certain medicines.  Gastroesophageal reflux disease (GERD) or other gastrointestinal conditions.  Asthma or other breathing conditions.  Restless legs syndrome, sleep apnea, or other sleep disorders.  Chronic pain.  Menopause. This may include hot flashes.  Stroke.  Abuse of alcohol, tobacco, or illegal drugs.  Depression.  Caffeine.   Neurological  disorders, such as Alzheimer disease.  An overactive thyroid (hyperthyroidism). The cause of insomnia may not be known. RISK FACTORS Risk factors for insomnia include:  Gender. Women are more commonly affected than men.  Age. Insomnia is more common  as you get older.  Stress. This may involve your professional or personal life.  Income. Insomnia is more common in people with lower income.  Lack of exercise.   Irregular work schedule or night shifts.  Traveling between different time zones. SIGNS AND SYMPTOMS If you have insomnia, trouble falling asleep or trouble staying asleep is the main symptom. This may lead to other symptoms, such as:  Feeling fatigued.  Feeling nervous about going to sleep.  Not feeling rested in the morning.  Having trouble concentrating.  Feeling irritable, anxious, or depressed. TREATMENT  Treatment for insomnia depends on the cause. If your insomnia is caused by an underlying condition, treatment will focus on addressing the condition. Treatment may also include:   Medicines to help you sleep.  Counseling or therapy.  Lifestyle adjustments. HOME CARE INSTRUCTIONS   Take medicines only as directed by your health care provider.  Keep regular sleeping and waking hours. Avoid naps.  Keep a sleep diary to help you and your health care provider figure out what could be causing your insomnia. Include:   When you sleep.  When you wake up during the night.  How well you sleep.   How rested you feel the next day.  Any side effects of medicines you are taking.  What you eat and drink.   Make your bedroom a comfortable place where it is easy to fall asleep:  Put up shades or special blackout curtains to block light from outside.  Use a white noise machine to block noise.  Keep the temperature cool.   Exercise regularly as directed by your health care provider. Avoid exercising right before bedtime.  Use relaxation techniques to manage stress. Ask your health care provider to suggest some techniques that may work well for you. These may include:  Breathing exercises.  Routines to release muscle tension.  Visualizing peaceful scenes.  Cut back on alcohol, caffeinated  beverages, and cigarettes, especially close to bedtime. These can disrupt your sleep.  Do not overeat or eat spicy foods right before bedtime. This can lead to digestive discomfort that can make it hard for you to sleep.  Limit screen use before bedtime. This includes:  Watching TV.  Using your smartphone, tablet, and computer.  Stick to a routine. This can help you fall asleep faster. Try to do a quiet activity, brush your teeth, and go to bed at the same time each night.  Get out of bed if you are still awake after 15 minutes of trying to sleep. Keep the lights down, but try reading or doing a quiet activity. When you feel sleepy, go back to bed.  Make sure that you drive carefully. Avoid driving if you feel very sleepy.  Keep all follow-up appointments as directed by your health care provider. This is important. SEEK MEDICAL CARE IF:   You are tired throughout the day or have trouble in your daily routine due to sleepiness.  You continue to have sleep problems or your sleep problems get worse. SEEK IMMEDIATE MEDICAL CARE IF:   You have serious thoughts about hurting yourself or someone else.   This information is not intended to replace advice given to you by your health care provider. Make sure you discuss any questions  you have with your health care provider.   Document Released: 11/22/2000 Document Revised: 08/16/2015 Document Reviewed: 08/26/2014 Elsevier Interactive Patient Education 2016 [employer].     IF you received an x-ray today, you will receive an invoice from [facility]. Please contact [facility] at [phone] with questions or concerns regarding your invoice.   IF you received labwork today, you will receive an invoice from [employer]. Please contact [facility] at [phone] with questions or concerns regarding your invoice.   Our billing staff will not be able to assist you with questions regarding bills from  these companies.  You will be contacted with the lab results as soon as they are available. The fastest way to get your results is to activate your My Chart account. Instructions are located on the last page of this paperwork. If you have not heard from [place] regarding the results in 2 weeks, please contact this office.       I personally performed the services described in this documentation, which was scribed in my presence. The recorded information has been reviewed and considered, and addended by me as needed.   Signed,   Merri Ray, MD [facility].  07/20/16 12:07 PM

## 2016-07-11 NOTE — Patient Instructions (Addendum)
Your shortness of breath with exertion may be due to unstable angina or your heart as we discussed. It is very important you call your cardiologist today or tomorrow to schedule follow-up and cardiac catheterization as was planned when you were in the hospital in May. If you have any increased chest pain, tightness, or worsening shortness of breath, call 911 or go to the emergency room.  As we discussed, I have concerns with you taking both tizanidine and Klonopin at bedtime. I would like you to try only one or the other to help you get to sleep. Additionally with your caffeine intake in the afternoon and evening, this is likely a big cause of your trouble getting to sleep. Do not drink caffeinated beverages after lunch, and see other information on insomnia below. I did temporarily refill the Klonopin, but if you continue to drink caffeinated beverages throughout the afternoon and evening, I'm not sure I will be able to prescribe a medicine for sleep at night in the future.  Nizoral cream if needed for rash in groin, but if this does not help within a week, or continues to spread, return for recheck.  I will refer you for sleep study as [employer] knees and up-to-date study/evaluation prior to any new equipment.  You had prediabetes based on previous blood work, will recheck that level today, and I included information below on prediabetes and diet. Follow-up with me in 3 months to recheck this level.  Follow-up with me within the next few months for physical, as we were unable to complete that today with other more pressing concerns. If any other acute issues prior to the physical, return for a visit with me prior to that appointment.  Prediabetes Eating Plan Prediabetes--also called impaired glucose tolerance or impaired fasting glucose--is a condition that causes blood sugar (blood glucose) levels to be higher than normal. Following a healthy diet can help to keep prediabetes under control. It can also  help to lower the risk of type 2 diabetes and heart disease, which are increased in people who have prediabetes. Along with regular exercise, a healthy diet:  Promotes weight loss.  Helps to control blood sugar levels.  Helps to improve the way that the body uses insulin. WHAT DO I NEED TO KNOW ABOUT THIS EATING PLAN?  Use the glycemic index (GI) to plan your meals. The index tells you how quickly a food will raise your blood sugar. Choose low-GI foods. These foods take a longer time to raise blood sugar.  Pay close attention to the amount of carbohydrates in the food that you eat. Carbohydrates increase blood sugar levels.  Keep track of how many calories you take in. Eating the right amount of calories will help you to achieve a healthy weight. Losing about 7 percent of your starting weight can help to prevent type 2 diabetes.  You may want to follow a Mediterranean diet. This diet includes a lot of vegetables, lean meats or fish, whole grains, fruits, and healthy oils and fats. WHAT FOODS CAN I EAT? Grains Whole grains, such as whole-wheat or whole-grain breads, crackers, cereals, and pasta. Unsweetened oatmeal. Bulgur. Barley. Quinoa. Brown rice. Corn or whole-wheat flour tortillas or taco shells. Vegetables Lettuce. Spinach. Peas. Beets. Cauliflower. Cabbage. Broccoli. Carrots. Tomatoes. Squash. Eggplant. Herbs. Peppers. Onions. Cucumbers. Brussels sprouts. Fruits Berries. Bananas. Apples. Oranges. Grapes. Papaya. Mango. Pomegranate. Kiwi. Grapefruit. Cherries. Meats and Other Protein Sources Seafood. Lean meats, such as chicken and [place] or lean cuts of pork and beef.  Tofu. Eggs. Nuts. Beans. Dairy Low-fat or fat-free dairy products, such as yogurt, cottage cheese, and cheese. Beverages Water. Tea. Coffee. Sugar-free or diet soda. Seltzer water. Milk. Milk alternatives, such as soy or almond milk. Condiments Mustard. Relish. Low-fat, low-sugar ketchup. Low-fat, low-sugar barbecue  sauce. Low-fat or fat-free mayonnaise. Sweets and Desserts Sugar-free or low-fat pudding. Sugar-free or low-fat ice cream and other frozen treats. Fats and Oils Avocado. Walnuts. Olive oil. The items listed above may not be a complete list of recommended foods or beverages. Contact your dietitian for more options.  WHAT FOODS ARE NOT RECOMMENDED? Grains Refined white flour and flour products, such as bread, pasta, snack foods, and cereals. Beverages Sweetened drinks, such as sweet iced tea and soda. Sweets and Desserts Baked goods, such as cake, cupcakes, pastries, cookies, and cheesecake. The items listed above may not be a complete list of foods and beverages to avoid. Contact your dietitian for more information.   This information is not intended to replace advice given to you by your health care provider. Make sure you discuss any questions you have with your health care provider.   Document Released: 04/11/2015 Document Reviewed: 04/11/2015 Elsevier Interactive Patient Education 2016 [employer].   Insomnia Insomnia is a sleep disorder that makes it difficult to fall asleep or to stay asleep. Insomnia can cause tiredness (fatigue), low energy, difficulty concentrating, mood swings, and poor performance at work or school.  There are three different ways to classify insomnia:  Difficulty falling asleep.  Difficulty staying asleep.  Waking up too early in the morning. Any type of insomnia can be long-term (chronic) or short-term (acute). Both are common. Short-term insomnia usually lasts for three months or less. Chronic insomnia occurs at least three times a week for longer than three months. CAUSES  Insomnia may be caused by another condition, situation, or substance, such as:  Anxiety.  Certain medicines.  Gastroesophageal reflux disease (GERD) or other gastrointestinal conditions.  Asthma or other breathing conditions.  Restless legs syndrome, sleep apnea, or other  sleep disorders.  Chronic pain.  Menopause. This may include hot flashes.  Stroke.  Abuse of alcohol, tobacco, or illegal drugs.  Depression.  Caffeine.   Neurological disorders, such as Alzheimer disease.  An overactive thyroid (hyperthyroidism). The cause of insomnia may not be known. RISK FACTORS Risk factors for insomnia include:  Gender. Women are more commonly affected than men.  Age. Insomnia is more common as you get older.  Stress. This may involve your professional or personal life.  Income. Insomnia is more common in people with lower income.  Lack of exercise.   Irregular work schedule or night shifts.  Traveling between different time zones. SIGNS AND SYMPTOMS If you have insomnia, trouble falling asleep or trouble staying asleep is the main symptom. This may lead to other symptoms, such as:  Feeling fatigued.  Feeling nervous about going to sleep.  Not feeling rested in the morning.  Having trouble concentrating.  Feeling irritable, anxious, or depressed. TREATMENT  Treatment for insomnia depends on the cause. If your insomnia is caused by an underlying condition, treatment will focus on addressing the condition. Treatment may also include:   Medicines to help you sleep.  Counseling or therapy.  Lifestyle adjustments. HOME CARE INSTRUCTIONS   Take medicines only as directed by your health care provider.  Keep regular sleeping and waking hours. Avoid naps.  Keep a sleep diary to help you and your health care provider figure out what could be causing  your insomnia. Include:   When you sleep.  When you wake up during the night.  How well you sleep.   How rested you feel the next day.  Any side effects of medicines you are taking.  What you eat and drink.   Make your bedroom a comfortable place where it is easy to fall asleep:  Put up shades or special blackout curtains to block light from outside.  Use a white noise machine  to block noise.  Keep the temperature cool.   Exercise regularly as directed by your health care provider. Avoid exercising right before bedtime.  Use relaxation techniques to manage stress. Ask your health care provider to suggest some techniques that may work well for you. These may include:  Breathing exercises.  Routines to release muscle tension.  Visualizing peaceful scenes.  Cut back on alcohol, caffeinated beverages, and cigarettes, especially close to bedtime. These can disrupt your sleep.  Do not overeat or eat spicy foods right before bedtime. This can lead to digestive discomfort that can make it hard for you to sleep.  Limit screen use before bedtime. This includes:  Watching TV.  Using your smartphone, tablet, and computer.  Stick to a routine. This can help you fall asleep faster. Try to do a quiet activity, brush your teeth, and go to bed at the same time each night.  Get out of bed if you are still awake after 15 minutes of trying to sleep. Keep the lights down, but try reading or doing a quiet activity. When you feel sleepy, go back to bed.  Make sure that you drive carefully. Avoid driving if you feel very sleepy.  Keep all follow-up appointments as directed by your health care provider. This is important. SEEK MEDICAL CARE IF:   You are tired throughout the day or have trouble in your daily routine due to sleepiness.  You continue to have sleep problems or your sleep problems get worse. SEEK IMMEDIATE MEDICAL CARE IF:   You have serious thoughts about hurting yourself or someone else.   This information is not intended to replace advice given to you by your health care provider. Make sure you discuss any questions you have with your health care provider.   Document Released: 11/22/2000 Document Revised: 08/16/2015 Document Reviewed: 08/26/2014 Elsevier Interactive Patient Education 2016 [employer].     IF you received an x-ray today, you will  receive an invoice from [facility]. Please contact [facility] at [phone] with questions or concerns regarding your invoice.   IF you received labwork today, you will receive an invoice from [employer]. Please contact [facility] at [phone] with questions or concerns regarding your invoice.   Our billing staff will not be able to assist you with questions regarding bills from these companies.  You will be contacted with the lab results as soon as they are available. The fastest way to get your results is to activate your My Chart account. Instructions are located on the last page of this paperwork. If you have not heard from [place] regarding the results in 2 weeks, please contact this office.

## 2016-07-12 LAB — COMPLETE METABOLIC PANEL WITH GFR
ALT: 13 U/L (ref 9–46)
AST: 14 U/L (ref 10–35)
Albumin: 4.3 g/dL (ref 3.6–5.1)
Alkaline Phosphatase: 70 U/L (ref 40–115)
BUN: 18 mg/dL (ref 7–25)
CO2: 19 mmol/L — ABNORMAL LOW (ref 20–31)
Calcium: 9.4 mg/dL (ref 8.6–10.3)
Chloride: 107 mmol/L (ref 98–110)
Creat: 0.99 mg/dL (ref 0.70–1.25)
GFR, Est African American: >89 mL/min (ref >=60)
GFR, Est Non African American: 78 mL/min (ref >=60)
Glucose, Bld: 115 mg/dL — ABNORMAL HIGH (ref 65–99)
Potassium: 4.5 mmol/L (ref 3.5–5.3)
Sodium: 137 mmol/L (ref 135–146)
Total Bilirubin: 0.7 mg/dL (ref 0.2–1.2)
Total Protein: 7.2 g/dL (ref 6.1–8.1)

## 2016-07-12 LAB — LIPID PANEL
Cholesterol: 158 mg/dL (ref 125–200)
HDL: 32 mg/dL — ABNORMAL LOW (ref >=40)
LDL Cholesterol: 106 mg/dL (ref <130)
Total CHOL/HDL Ratio: 4.9 Ratio (ref <=5.0)
Triglycerides: 99 mg/dL (ref <150)
VLDL: 20 mg/dL (ref <30)

## 2016-07-12 LAB — HEMOGLOBIN A1C
Hgb A1c MFr Bld: 6.5 % — ABNORMAL HIGH (ref <5.7)
Mean Plasma Glucose: 140 mg/dL

## 2016-08-21 ENCOUNTER — Other Ambulatory Visit: Payer: Self-pay | Admitting: Family Medicine

## 2016-08-21 DIAGNOSIS — G4700 Insomnia, unspecified: Secondary | ICD-10-CM

## 2016-08-22 ENCOUNTER — Encounter: Payer: PPO | Admitting: Family Medicine

## 2016-08-22 NOTE — Telephone Encounter (Signed)
per our conversation at last office visit: "Discussed concerns of benzodiazepine use, including addiction potential, sedation, and respiratory depression combined with his muscle relaxant. Also discussed need to cut back on caffeine, as this was working against his sleep, and likely a reason he was requiring medications at night. He refused to cut back on caffeine. I did agree to write for 1 more month of Klonopin for him to taper, but discussed this would not be a long-term treatment as long as he continued caffeine intake in the evening"  If he is still requiring this medication, recommend follow-up to discuss options with myself or other provider if needed.

## 2016-08-22 NOTE — Telephone Encounter (Signed)
Last OV and 1 mos Rx given on 8/3.

## 2016-08-26 NOTE — Telephone Encounter (Signed)
LMOM for pt that he will need to RTC to discuss refills if he did not taper off as planned and still needs this.

## 2016-09-04 ENCOUNTER — Other Ambulatory Visit: Payer: Self-pay | Admitting: Family Medicine

## 2016-09-04 DIAGNOSIS — M545 Low back pain, unspecified: Secondary | ICD-10-CM

## 2016-11-21 DIAGNOSIS — E782 Mixed hyperlipidemia: Secondary | ICD-10-CM | POA: Diagnosis not present

## 2016-11-21 DIAGNOSIS — G4733 Obstructive sleep apnea (adult) (pediatric): Secondary | ICD-10-CM | POA: Diagnosis not present

## 2016-11-21 DIAGNOSIS — F17219 Nicotine dependence, cigarettes, with unspecified nicotine-induced disorders: Secondary | ICD-10-CM | POA: Diagnosis not present

## 2016-11-21 DIAGNOSIS — I519 Heart disease, unspecified: Secondary | ICD-10-CM | POA: Diagnosis not present

## 2016-11-21 DIAGNOSIS — I2510 Atherosclerotic heart disease of native coronary artery without angina pectoris: Secondary | ICD-10-CM | POA: Diagnosis not present

## 2016-11-21 DIAGNOSIS — I10 Essential (primary) hypertension: Secondary | ICD-10-CM | POA: Diagnosis not present

## 2016-11-21 DIAGNOSIS — E669 Obesity, unspecified: Secondary | ICD-10-CM | POA: Diagnosis not present

## 2016-11-21 DIAGNOSIS — Z9889 Other specified postprocedural states: Secondary | ICD-10-CM | POA: Diagnosis not present

## 2017-01-28 ENCOUNTER — Ambulatory Visit (INDEPENDENT_AMBULATORY_CARE_PROVIDER_SITE_OTHER): Payer: PPO | Admitting: Family Medicine

## 2017-01-28 ENCOUNTER — Encounter: Payer: Self-pay | Admitting: Family Medicine

## 2017-01-28 VITALS — BP 124/64 | HR 66 | Temp 99.1°F | Ht 71.5 in | Wt 275.2 lb

## 2017-01-28 DIAGNOSIS — L989 Disorder of the skin and subcutaneous tissue, unspecified: Secondary | ICD-10-CM

## 2017-01-28 DIAGNOSIS — Z9989 Dependence on other enabling machines and devices: Secondary | ICD-10-CM

## 2017-01-28 DIAGNOSIS — F411 Generalized anxiety disorder: Secondary | ICD-10-CM

## 2017-01-28 DIAGNOSIS — Z7189 Other specified counseling: Secondary | ICD-10-CM

## 2017-01-28 DIAGNOSIS — G4700 Insomnia, unspecified: Secondary | ICD-10-CM

## 2017-01-28 DIAGNOSIS — I2510 Atherosclerotic heart disease of native coronary artery without angina pectoris: Secondary | ICD-10-CM

## 2017-01-28 DIAGNOSIS — R21 Rash and other nonspecific skin eruption: Secondary | ICD-10-CM

## 2017-01-28 DIAGNOSIS — E118 Type 2 diabetes mellitus with unspecified complications: Secondary | ICD-10-CM | POA: Diagnosis not present

## 2017-01-28 DIAGNOSIS — C801 Malignant (primary) neoplasm, unspecified: Secondary | ICD-10-CM | POA: Diagnosis not present

## 2017-01-28 DIAGNOSIS — G4733 Obstructive sleep apnea (adult) (pediatric): Secondary | ICD-10-CM

## 2017-01-28 MED ORDER — CLONAZEPAM 0.5 MG PO TABS: ORAL_TABLET | ORAL | 0 refills | Status: DC

## 2017-01-28 MED ORDER — CEPHALEXIN 500 MG PO CAPS
500.0000 mg | ORAL_CAPSULE | Freq: Four times a day (QID) | ORAL | 0 refills | Status: DC
Start: 2017-01-28 — End: 2017-07-30

## 2017-01-28 NOTE — Patient Instructions (Addendum)
Check with your insurance company about replacement parts for the CPAP.  Have them send a request to me.   If you need the klonopin frequently, we'll need to change to another medicine. Use only if needed.  Start the antibiotics and update me if not better.  Get fasting labs done before a diabetic visit here at the clinic.  Take care.  Glad to see you.

## 2017-01-28 NOTE — Progress Notes (Signed)
Anxiety/insomnia.  On klonopin prn for anxiety, not taking daily.  Widowed after 12 years.  Wife died from ovarian CA/CVA.  No SI/HI.  Discussed with patient about when necessary use. Routine cautions given. If he needs this frequently will need to change medications. Discussed with patient.  CAD hx noted.  No h/o MI per patient report.  No CP now.   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 Mild LAD disease- stable, Nuclear stress test prev done with  inferolateral wall ischemia, possible from Cir territory. 2. Mild LV dysfunction - h/o LVEF 45%.   Advance directive d/w pt.  Would have his son, daughter and brother equally designated if patient were incapacitated.    Rash.  On the B chest and upper arms.  Itches.  No sx like this prev.  Tried otc itch cream.  Helps with itching for about 1 hour but doesn't fade away.  No new soaps, meds.  No new foods.  No hand rash.    OSA prev tested.  Needs replacement parts for his CPAP mask.  Complaint.  Sleeping better with use.    DM2.  Patient was unaware.  Discussed with patient. See plan  PMH and SH reviewed  ROS: Per HPI unless specifically indicated in ROS section   Meds, vitals, and allergies reviewed.   GEN: nad, alert and oriented HEENT: mucous membranes moist NECK: supple w/o LA CV: rrr.  no murmur PULM: ctab, no inc wob ABD: soft, +bs EXT: trace BLE edema SKIN: Diffuse maculopapular rash on the trunk, the lesions appear to be associated with hair follicles. No ulceration. He does have a pustule that was cleaned with alcohol, superficially pierced with an 18-gauge needle and wound culture was collected.  No complications. Rash is not dermatomal. No fluctuant mass.

## 2017-01-28 NOTE — Progress Notes (Signed)
Pre visit review using our clinic review tool, if applicable. No additional management support is needed unless otherwise documented below in the visit note. 

## 2017-01-29 ENCOUNTER — Telehealth: Payer: Self-pay

## 2017-01-29 ENCOUNTER — Encounter: Payer: Self-pay | Admitting: Family Medicine

## 2017-01-29 DIAGNOSIS — C801 Malignant (primary) neoplasm, unspecified: Secondary | ICD-10-CM | POA: Insufficient documentation

## 2017-01-29 DIAGNOSIS — E118 Type 2 diabetes mellitus with unspecified complications: Secondary | ICD-10-CM | POA: Insufficient documentation

## 2017-01-29 DIAGNOSIS — Z9989 Dependence on other enabling machines and devices: Secondary | ICD-10-CM

## 2017-01-29 DIAGNOSIS — G4733 Obstructive sleep apnea (adult) (pediatric): Secondary | ICD-10-CM | POA: Insufficient documentation

## 2017-01-29 DIAGNOSIS — Z7189 Other specified counseling: Secondary | ICD-10-CM | POA: Insufficient documentation

## 2017-01-29 DIAGNOSIS — R21 Rash and other nonspecific skin eruption: Secondary | ICD-10-CM | POA: Insufficient documentation

## 2017-01-29 NOTE — Assessment & Plan Note (Signed)
Per cardiology 

## 2017-01-29 NOTE — Telephone Encounter (Signed)
I need details, ie what kind of mask, what size, what shape, what headgear, etc.  Thanks.

## 2017-01-29 NOTE — Assessment & Plan Note (Signed)
Likely folliculitis. Start Keflex. Wound culture collected. Okay for outpatient follow-up.

## 2017-01-29 NOTE — Telephone Encounter (Signed)
Patient stated that he spoke to a representative with [employer] and was advised that the script needs to be written for a large plastic mask with cushion and headgear and that is all it needs to be written for.  Patient stated that they only carry two types and this is the one that he needs.

## 2017-01-29 NOTE — Assessment & Plan Note (Addendum)
With known coronary disease as a complication. He did not known he was diabetic. Discussed with patient. Pathophysiology discussed. Needs return for routine diabetic labs. Diabetic handout regarding diet given to patient and discussed with questions answered.

## 2017-01-29 NOTE — Assessment & Plan Note (Signed)
Advance directive d/w pt.  Would have his son, daughter and brother equally designated if patient were incapacitated.

## 2017-01-29 NOTE — Telephone Encounter (Signed)
Pt left v/m;pt seen 01/28/17  And pt contacted ins co about getting cpap mask and pt was advised to have prescription for cpap mask faxed to [facility] at [phone]. Pt request cb when done.

## 2017-01-29 NOTE — Assessment & Plan Note (Signed)
Okay to use as needed Klonopin, assuming he uses it sparingly. If he needs it more often then we will need to change medications. Discussed with patient. Refill printed and given to patient.

## 2017-01-29 NOTE — Assessment & Plan Note (Signed)
He'll get the form sent to me regarding replacement parts.  >30 minutes spent in face to face time with patient, >50% spent in counselling or coordination of care.

## 2017-01-30 ENCOUNTER — Other Ambulatory Visit: Payer: Self-pay | Admitting: Primary Care

## 2017-01-30 ENCOUNTER — Other Ambulatory Visit (INDEPENDENT_AMBULATORY_CARE_PROVIDER_SITE_OTHER): Payer: PPO

## 2017-01-30 DIAGNOSIS — E119 Type 2 diabetes mellitus without complications: Secondary | ICD-10-CM

## 2017-01-30 LAB — HEMOGLOBIN A1C: Hgb A1c MFr Bld: 6.7 % — ABNORMAL HIGH (ref 4.6–6.5)

## 2017-01-30 NOTE — Telephone Encounter (Signed)
This could have waited for my return to office.  Thanks.

## 2017-01-30 NOTE — Telephone Encounter (Signed)
Noted. Rx written and provided to [facility] for faxing to [place]. Please call patient and notify this has been done.

## 2017-01-30 NOTE — Telephone Encounter (Signed)
Patient came in for labs today but wanted to check status of CPAP supply r/x.   Notified him that Dr. Damita Dunnings out of the office due to an emergency and will forward to another provider for response.  Patient has ask that we call him when r/x has been faxed in to [facility].  Thanks.

## 2017-01-31 LAB — WOUND CULTURE
Gram Stain: NONE SEEN
Gram Stain: NONE SEEN
Gram Stain: NONE SEEN
Organism ID, Bacteria: NORMAL

## 2017-01-31 NOTE — Telephone Encounter (Signed)
Rx has already been faxed to [place].  Message left for patient to return my call.

## 2017-01-31 NOTE — Telephone Encounter (Signed)
Patient called back. Christian [name] has notiifed patient that Rx has been faxed and done.

## 2017-02-20 DIAGNOSIS — G4733 Obstructive sleep apnea (adult) (pediatric): Secondary | ICD-10-CM | POA: Diagnosis not present

## 2017-02-20 DIAGNOSIS — I10 Essential (primary) hypertension: Secondary | ICD-10-CM | POA: Diagnosis not present

## 2017-02-20 DIAGNOSIS — Z9889 Other specified postprocedural states: Secondary | ICD-10-CM | POA: Diagnosis not present

## 2017-02-20 DIAGNOSIS — F17219 Nicotine dependence, cigarettes, with unspecified nicotine-induced disorders: Secondary | ICD-10-CM | POA: Diagnosis not present

## 2017-02-20 DIAGNOSIS — E669 Obesity, unspecified: Secondary | ICD-10-CM | POA: Diagnosis not present

## 2017-02-20 DIAGNOSIS — E782 Mixed hyperlipidemia: Secondary | ICD-10-CM | POA: Diagnosis not present

## 2017-02-20 DIAGNOSIS — I2510 Atherosclerotic heart disease of native coronary artery without angina pectoris: Secondary | ICD-10-CM | POA: Diagnosis not present

## 2017-02-20 DIAGNOSIS — I519 Heart disease, unspecified: Secondary | ICD-10-CM | POA: Diagnosis not present

## 2017-02-24 DIAGNOSIS — G4733 Obstructive sleep apnea (adult) (pediatric): Secondary | ICD-10-CM | POA: Diagnosis not present

## 2017-04-28 ENCOUNTER — Other Ambulatory Visit: Payer: Self-pay | Admitting: Family Medicine

## 2017-04-28 DIAGNOSIS — E118 Type 2 diabetes mellitus with unspecified complications: Secondary | ICD-10-CM

## 2017-04-30 ENCOUNTER — Other Ambulatory Visit: Payer: PPO

## 2017-05-02 ENCOUNTER — Ambulatory Visit: Payer: PPO | Admitting: Family Medicine

## 2017-05-06 ENCOUNTER — Other Ambulatory Visit: Payer: Self-pay

## 2017-05-06 ENCOUNTER — Telehealth: Payer: Self-pay

## 2017-05-06 MED ORDER — METHOCARBAMOL 750 MG PO TABS: 750.0000 mg | ORAL_TABLET | Freq: Every day | ORAL | 0 refills | Status: DC | PRN

## 2017-05-06 NOTE — Telephone Encounter (Signed)
Refer to Medication Refill note.  Patient made aware that R/X sent in per Dr. Alla German instructions [facility] [address].

## 2017-05-06 NOTE — Telephone Encounter (Signed)
Methocarbamol is a muscle relaxant Okay to refill #30 x 0 of the 750mg  1 daily prn Send to Dr Damita Dunnings for his information after

## 2017-05-06 NOTE — Telephone Encounter (Signed)
Patient notified R/X [facility] [address].

## 2017-05-06 NOTE — Telephone Encounter (Addendum)
Pt left v/m; pt established care on 01/28/17;pt requesting refill on simvastatin; per med list simvastatin 40 mg last refilled # 90 x 3 on 07/28/2014 by Dr Joseph Art. Left v/m for pt to cb to discuss how long been off simvastatin. Pt called back and left v/m that the medicine was muscle relaxant. I called pt back and he thought simvastatin was muscle relaxant. Pt wants to know name of muscle relaxer that Dr Joseph Art had previously given pt. Pt taking one methocarbamol 750 mg daily to keep from hurting. Pt request [facility]ill.[facility] [address] last refilled by Dr Joseph Art # 180 x 3 on 06/02/13. (pt said was prescribed to take qid but now pt only takes one daily.Please advise. Dr Damita Dunnings out of office this week.

## 2017-05-07 NOTE — Telephone Encounter (Signed)
Also- he needs f/u DM2 visit with labs ahead time.  Thanks.

## 2017-05-07 NOTE — Telephone Encounter (Signed)
Received Prior Auth for the methocarbamol 750 mg. Already the request through cover my meds.   Waiting of responds from insurance.

## 2017-05-08 NOTE — Telephone Encounter (Signed)
Patient advised.

## 2017-05-14 ENCOUNTER — Telehealth: Payer: Self-pay | Admitting: *Deleted

## 2017-05-14 NOTE — Telephone Encounter (Signed)
Patient left a voicemail stating that his insurance will not pay for his medication for his back. Patient stated that he has the name of the medication that insurance willil cover. Left message for patient to call back with the name of the medication.

## 2017-05-14 NOTE — Telephone Encounter (Signed)
Spoke to patient and was advised that he is okay with whatever medication that Dr. Damita Dunnings wants to substitute. Advised patient that he needs to call his insurance company and find out what they will cover and call back and let Dr. Damita Dunnings know. Patient stated that he thought we would do that. Advised patient that he needs to make the call to his insurance company. Patient stated that he will call back with information.

## 2017-05-15 NOTE — Telephone Encounter (Signed)
Pt left v/m; pt said the ins. Co denied medication for back. Pt spoke with ins co and baclofen 20 mg taking 2 pills per day is approved by ins co if approved by Dr Damita Dunnings. [facility] [address]

## 2017-05-16 MED ORDER — BACLOFEN 20 MG PO TABS: 20.0000 mg | ORAL_TABLET | Freq: Two times a day (BID) | ORAL | 0 refills | Status: DC | PRN

## 2017-05-16 NOTE — Telephone Encounter (Signed)
Left detailed message on voicemail.  

## 2017-05-16 NOTE — Telephone Encounter (Signed)
Sent. Sedation caution.  Update [place] as needed.  Thanks.

## 2017-07-08 DIAGNOSIS — G4733 Obstructive sleep apnea (adult) (pediatric): Secondary | ICD-10-CM | POA: Diagnosis not present

## 2017-07-24 ENCOUNTER — Telehealth: Payer: Self-pay | Admitting: Family Medicine

## 2017-07-24 NOTE — Telephone Encounter (Signed)
Left pt message asking to call Allison back directly at [phone] to schedule AWV + labs with Lesia and CPE with PCP. ° °*NOTE* Never had AWV before °

## 2017-07-30 ENCOUNTER — Ambulatory Visit (INDEPENDENT_AMBULATORY_CARE_PROVIDER_SITE_OTHER): Payer: PPO

## 2017-07-30 ENCOUNTER — Telehealth: Payer: Self-pay

## 2017-07-30 ENCOUNTER — Other Ambulatory Visit: Payer: Self-pay | Admitting: Family Medicine

## 2017-07-30 VITALS — BP 118/70 | HR 61 | Temp 98.5°F | Ht 71.5 in | Wt 263.8 lb

## 2017-07-30 DIAGNOSIS — Z23 Encounter for immunization: Secondary | ICD-10-CM | POA: Diagnosis not present

## 2017-07-30 DIAGNOSIS — E119 Type 2 diabetes mellitus without complications: Secondary | ICD-10-CM

## 2017-07-30 DIAGNOSIS — Z1159 Encounter for screening for other viral diseases: Secondary | ICD-10-CM

## 2017-07-30 DIAGNOSIS — Z0000 Encounter for general adult medical examination without abnormal findings: Secondary | ICD-10-CM | POA: Diagnosis not present

## 2017-07-30 DIAGNOSIS — Z125 Encounter for screening for malignant neoplasm of prostate: Secondary | ICD-10-CM

## 2017-07-30 LAB — COMPREHENSIVE METABOLIC PANEL
ALT: 16 U/L (ref 0–53)
AST: 15 U/L (ref 0–37)
Albumin: 4.2 g/dL (ref 3.5–5.2)
Alkaline Phosphatase: 56 U/L (ref 39–117)
BUN: 14 mg/dL (ref 6–23)
CO2: 29 mEq/L (ref 19–32)
Calcium: 9.4 mg/dL (ref 8.4–10.5)
Chloride: 106 mEq/L (ref 96–112)
Creatinine, Ser: 1.01 mg/dL (ref 0.40–1.50)
GFR: 77.86 mL/min (ref >60.00)
Glucose, Bld: 119 mg/dL — ABNORMAL HIGH (ref 70–99)
Potassium: 4.7 mEq/L (ref 3.5–5.1)
Sodium: 139 mEq/L (ref 135–145)
Total Bilirubin: 0.6 mg/dL (ref 0.2–1.2)
Total Protein: 6.6 g/dL (ref 6.0–8.3)

## 2017-07-30 LAB — LIPID PANEL
Cholesterol: 97 mg/dL (ref 0–200)
HDL: 31.8 mg/dL — ABNORMAL LOW (ref >39.00)
LDL Cholesterol: 53 mg/dL (ref 0–99)
NonHDL: 65.16
Total CHOL/HDL Ratio: 3
Triglycerides: 60 mg/dL (ref 0.0–149.0)
VLDL: 12 mg/dL (ref 0.0–40.0)

## 2017-07-30 LAB — HEMOGLOBIN A1C: Hgb A1c MFr Bld: 6.3 % (ref 4.6–6.5)

## 2017-07-30 NOTE — Progress Notes (Signed)
PCP notes:   Health maintenance:  Foot exam - PCP please address at next appt Flu vaccine - addressed Eye exam - addressed A1C - completed PCV13 - administered  Abnormal screenings:   Hearing - failed Depression score: 1 Mini-Cog score: 18/20  Patient concerns:   Medication management - Patient needs refill for Imdur. Patient states he has not taken medication in previous 2 months. Patient needs clarification about correct dosage of Metoprolol Succinate.   Nurse concerns:  None  Next PCP appt:   08/05/17 @ 1045

## 2017-07-30 NOTE — Patient Instructions (Signed)
Mr. Christian [name] , Thank you for taking time to come for your Medicare Wellness Visit. I appreciate your ongoing commitment to your health goals. Please review the following plan we discussed and let me know if I can assist you in the future.   These are the goals we discussed: Goals    . Increase water intake          Starting 07/30/2017, I will continue to drink 1 glass of water with each meal and 1 glass of water at bedtime.        This is a list of the screening recommended for you and due dates:  Health Maintenance  Topic Date Due  . Complete foot exam   08/05/2017*  . Flu Shot  03/08/2018*  . Eye exam for diabetics  07/30/2018*  . Hemoglobin A1C  01/30/2018  . Pneumonia vaccines (2 of 2 - PPSV23) 07/30/2018  . Colon Cancer Screening  11/08/2019  . Tetanus Vaccine  11/13/2020  .  Hepatitis C: One time screening is recommended by Center for Disease Control  (CDC) for  adults born from [birth year] through 1963.   Completed  *Topic was postponed. The date shown is not the original due date.   Preventive Care for Adults  A healthy lifestyle and preventive care can promote health and wellness. Preventive health guidelines for adults include the following key practices.  . A routine yearly physical is a good way to check with your health care provider about your health and preventive screening. It is a chance to share any concerns and updates on your health and to receive a thorough exam.  . Visit your dentist for a routine exam and preventive care every 6 months. Brush your teeth twice a day and floss once a day. Good oral hygiene prevents tooth decay and gum disease.  . The frequency of eye exams is based on your age, health, family medical history, use  of contact lenses, and other factors. Follow your health care provider's ecommendations for frequency of eye exams.  . Eat a healthy diet. Foods like vegetables, fruits, whole grains, low-fat dairy products, and lean protein foods contain the  nutrients you need without too many calories. Decrease your intake of foods high in solid fats, added sugars, and salt. Eat the right amount of calories for you. Get information about a proper diet from your health care provider, if necessary.  . Regular physical exercise is one of the most important things you can do for your health. Most adults should get at least 150 minutes of moderate-intensity exercise (any activity that increases your heart rate and causes you to sweat) each week. In addition, most adults need muscle-strengthening exercises on 2 or more days a week.  Silver Sneakers may be a benefit available to you. To determine eligibility, you may visit the website: www.silversneakers.com or contact program at [phone] Mon-Fri between 8AM-8PM.   . Maintain a healthy weight. The body mass index (BMI) is a screening tool to identify possible weight problems. It provides an estimate of body fat based on height and weight. Your health care provider can find your BMI and can help you achieve or maintain a healthy weight.   For adults 20 years and older: ? A BMI below 18.5 is considered underweight. ? A BMI of 18.5 to 24.9 is normal. ? A BMI of 25 to 29.9 is considered overweight. ? A BMI of 30 and above is considered obese.   . Maintain normal blood lipids and cholesterol  levels by exercising and minimizing your intake of saturated fat. Eat a balanced diet with plenty of fruit and vegetables. Blood tests for lipids and cholesterol should begin at age 30 and be repeated every 5 years. If your lipid or cholesterol levels are high, you are over 50, or you are at high risk for heart disease, you may need your cholesterol levels checked more frequently. Ongoing high lipid and cholesterol levels should be treated with medicines if diet and exercise are not working.  . If you smoke, find out from your health care provider how to quit. If you do not use tobacco, please do not start.  . If you  choose to drink alcohol, please do not consume more than 2 drinks per day. One drink is considered to be 12 ounces (355 mL) of beer, 5 ounces (148 mL) of wine, or 1.5 ounces (44 mL) of liquor.  . If you are 23-37 years old, ask your health care provider if you should take aspirin to prevent strokes.  . Use sunscreen. Apply sunscreen liberally and repeatedly throughout the day. You should seek shade when your shadow is shorter than you. Protect yourself by wearing long sleeves, pants, a wide-brimmed hat, and sunglasses year round, whenever you are outdoors.  . Once a month, do a whole body skin exam, using a mirror to look at the skin on your back. Tell your health care provider of new moles, moles that have irregular borders, moles that are larger than a pencil eraser, or moles that have changed in shape or color.

## 2017-07-30 NOTE — Telephone Encounter (Signed)
Patient was in today for AWV. During medication review, it was noted patient is taking Metoprolol Succinate ER 25 mg 1 tablet by mouth daily. Prescription was refilled on 07/22/17 by [facility].   Per medication list in EPIC, patient was prescribed Metoprolol Succinate 50 mg SR 24 hr 1 tablet by mouth daily. Prescription began 05/08/2016.   Contacted [facility] and per pharmacy technician, there is only a hard copy for Metoprolol 25 mg.  Please advise which dosage should patient be taking to manage BP.  Thank you.

## 2017-07-30 NOTE — Telephone Encounter (Signed)
We have 50mg  listed in EMR.  What is the origin of the 25mg  rx?

## 2017-07-30 NOTE — Progress Notes (Signed)
Subjective:   Christian [name] is a 71 y.o. male who presents for Medicare Annual/Subsequent preventive examination.  Review of Systems:  N/A Cardiac Risk Factors include: advanced age (>58men, >92 women);smoking/ tobacco exposure;male gender;obesity (BMI >30kg/m2);diabetes mellitus;dyslipidemia;hypertension     Objective:    Vitals: BP 118/70 (BP Location: Right Arm, Patient Position: Sitting, Cuff Size: Normal)   Pulse 61   Temp 98.5 F (36.9 C) (Oral)   Ht 5' 11.5" (1.816 m) Comment: no shoes  Wt 263 lb 12 oz (119.6 kg)   SpO2 95%   BMI 36.27 kg/m   Body mass index is 36.27 kg/m.  Tobacco History  Smoking Status  . Current Every Day Smoker  . Packs/day: 1.00  . Years: 40.00  . Types: Cigarettes  Smokeless Tobacco  . Never Used     Ready to quit: No Counseling given: No   Past Medical History:  Diagnosis Date  . Anxiety   . Cancer ([place])    skin  . Diabetes mellitus type 2 with complications (HCC)    known CAD.   Marland Kitchen History of shingles    2004  . Hyperlipidemia   . Hypertension   . OSA on CPAP    Past Surgical History:  Procedure Laterality Date  . CARDIAC CATHETERIZATION     2010 @ [facility] (per pt)  . EYE SURGERY Bilateral    cataracts  . OPEN REDUCTION INTERNAL FIXATION (ORIF) DISTAL RADIAL FRACTURE Right 11/26/2013   Procedure: OPEN REDUCTION INTERNAL FIXATION (ORIF) DISTAL RADIAL FRACTURE;  Surgeon: Newt Minion, MD;  Location: [place];  Service: Orthopedics;  Laterality: Right;  Open Reduction Internal Fixation Right Distal Radius    Family History  Problem Relation Age of Onset  . Heart disease Mother   . Heart disease Father   . Colon cancer Father        possible colon or prostate cancer, dx'd in his 92s, patient wasn't sure of source.   . Prostate cancer Father        possible colon or prostate cancer, dx'd in his 74s, patient wasn't sure of source.   Marland Kitchen Heart disease Brother   . Diabetes Brother   . Kidney disease Brother   .  Heart disease Sister   . Cancer Sister    History  Sexual Activity  . Sexual activity: Not on file    Outpatient Encounter Prescriptions as of 07/30/2017  Medication Sig  . albuterol (PROVENTIL HFA;VENTOLIN HFA) 108 (90 BASE) MCG/ACT inhaler Inhale 2 puffs into the lungs every 6 (six) hours as needed for wheezing or shortness of breath.  Marland Kitchen aspirin 325 MG tablet Take 325 mg by mouth daily.  . baclofen (LIORESAL) 20 MG tablet Take 1 tablet (20 mg total) by mouth 2 (two) times daily as needed for muscle spasms.  . clonazePAM (KLONOPIN) 0.5 MG tablet TAKE ONE TABLET BY MOUTH AT BEDTIME AS NEEDED FOR ANXIETY OR INSOMNIA  . isosorbide mononitrate (IMDUR) 30 MG 24 hr tablet Take 1 tablet (30 mg total) by mouth daily. (Patient not taking: Reported on 07/30/2017)  . ketoconazole (NIZORAL) 2 % cream Apply 1 application topically 2 (two) times daily.  Marland Kitchen lisinopril (PRINIVIL,ZESTRIL) 10 MG tablet Take 1 tablet (10 mg total) by mouth daily. PATIENT NEEDS OFFICE VISIT FOR ADDITIONAL REFILLS  . Melaton-Thean-Cham-PassF-LBalm (MELATONIN + L-THEANINE PO) Take 1 tablet by mouth at bedtime as needed (sleep).   . metoprolol succinate (TOPROL-XL) 50 MG 24 hr tablet Take 1 tablet (50 mg  total) by mouth daily. Take with or immediately following a meal.  . nitroGLYCERIN (NITROSTAT) 0.4 MG SL tablet Place 1 tablet (0.4 mg total) under the tongue every 5 (five) minutes as needed for chest pain.  . simvastatin (ZOCOR) 40 MG tablet Take 1 tablet (40 mg total) by mouth at bedtime.  . tizanidine (ZANAFLEX) 2 MG capsule TAKE 2 CAPSULES BY MOUTH AT BEDTIME AS NEEDED FOR MUSCLE SPASMS.  . vitamin E 100 UNIT capsule Take 100 Units by mouth daily.  . [DISCONTINUED] cephALEXin (KEFLEX) 500 MG capsule Take 1 capsule (500 mg total) by mouth 4 (four) times daily.   No facility-administered encounter medications on file as of 07/30/2017.     Activities of Daily Living In your present state of health, do you have any difficulty  performing the following activities: 07/30/2017  Hearing? Y  Vision? N  Difficulty concentrating or making decisions? N  Walking or climbing stairs? Y  Dressing or bathing? N  Doing errands, shopping? N  Preparing Food and eating ? N  Using the Toilet? N  In the past six months, have you accidently leaked urine? N  Do you have problems with loss of bowel control? N  Managing your Medications? N  Managing your Finances? N  Housekeeping or managing your Housekeeping? N  Some recent data might be hidden    Patient Care Team: [name], MD as PCP - General (Family Medicine)   Assessment:     Hearing Screening   125Hz  250Hz  500Hz  1000Hz  2000Hz  3000Hz  4000Hz  6000Hz  8000Hz   Right ear:   0 0 40  0    Left ear:   40 40 40  0      Visual Acuity Screening   Right eye Left eye Both eyes  Without correction: 20/20 20/20 20/20-1  With correction:      Exercise Activities and Dietary recommendations Current Exercise Habits: The patient does not participate in regular exercise at present, Exercise limited by: None identified  Goals    . Increase water intake          Starting 07/30/2017, I will continue to drink 1 glass of water with each meal and 1 glass of water at bedtime.       Fall Risk Fall Risk  07/30/2017 07/11/2016 07/28/2014  Falls in the past year? No No Yes  Number falls in past yr: - - 1  Injury with Fall? - - Yes   Depression Screen PHQ 2/9 Scores 07/30/2017 07/11/2016 07/28/2014  PHQ - 2 Score 0 0 0  PHQ- 9 Score 1 - -    Cognitive Function MMSE - Mini Mental State Exam 07/30/2017  Orientation to time 5  Orientation to Place 5  Registration 3  Attention/ Calculation 0  Recall 2  Recall-comments pt was unable to recall 1 of 3 words  Language- name 2 objects 0  Language- repeat 1  Language- follow 3 step command 2  Language- follow 3 step command-comments pt was unable to follow 1 step of 3 step command  Language- read & follow direction 0  Write a sentence  0  Copy design 0  Total score 18     PLEASE NOTE: A Mini-Cog screen was completed. Maximum score is 20. A value of 0 denotes this part of Folstein MMSE was not completed or the patient failed this part of the Mini-Cog screening.   Mini-Cog Screening Orientation to Time - Max 5 pts Orientation to Place - Max 5 pts Registration -  Max 3 pts Recall - Max 3 pts Language Repeat - Max 1 pts Language Follow 3 Step Command - Max 3 pts     Immunization History  Administered Date(s) Administered  . DTaP 12/14/2009  . Influenza Split 12/14/2009, 11/17/2014  . Influenza-Unspecified 09/25/2016  . Pneumococcal Conjugate-13 07/30/2017  . Pneumococcal Polysaccharide-23 06/02/2013   Screening Tests Health Maintenance  Topic Date Due  . FOOT EXAM  08/05/2017 (Originally 08/25/1956)  . INFLUENZA VACCINE  03/08/2018 (Originally 07/09/2017)  . OPHTHALMOLOGY EXAM  07/30/2018 (Originally 08/25/1956)  . HEMOGLOBIN A1C  01/30/2018  . PNA vac Low Risk Adult (2 of 2 - PPSV23) 07/30/2018  . COLONOSCOPY  11/08/2019  . TETANUS/TDAP  11/13/2020  . Hepatitis C Screening  Completed      Plan:     I have personally reviewed and addressed the Medicare Annual Wellness questionnaire and have noted the following in the patient's chart:  A. Medical and social history B. Use of alcohol, tobacco or illicit drugs  C. Current medications and supplements D. Functional ability and status E.  Nutritional status F.  Physical activity G. Advance directives H. List of other physicians I.  Hospitalizations, surgeries, and ER visits in previous 12 months J.  [place] to include hearing, vision, cognitive, depression L. Referrals and appointments - none  In addition, I have reviewed and discussed with patient certain preventive protocols, quality metrics, and best practice recommendations. A written personalized care plan for preventive services as well as general preventive health recommendations were  provided to patient.  See attached scanned questionnaire for additional information.   Signed,   Lindell Noe, MHA, BS, LPN Health Coach

## 2017-07-31 LAB — PSA, MEDICARE: PSA: 1.63 ng/ml (ref 0.10–4.00)

## 2017-07-31 LAB — HEPATITIS C ANTIBODY: HCV Ab: NONREACTIVE

## 2017-07-31 NOTE — Progress Notes (Signed)
   Subjective:    Patient ID: Christian [name], male    DOB: 16-Oct-1946, 71 y.o.   MRN: [id number]  HPI  I reviewed health advisor's note, was available for consultation, and agree with documentation and plan.    Review of Systems     Objective:   Physical Exam        Assessment & Plan:

## 2017-08-05 ENCOUNTER — Encounter: Payer: Self-pay | Admitting: Family Medicine

## 2017-08-05 ENCOUNTER — Ambulatory Visit (INDEPENDENT_AMBULATORY_CARE_PROVIDER_SITE_OTHER): Payer: PPO | Admitting: Family Medicine

## 2017-08-05 VITALS — BP 118/70 | HR 61 | Temp 98.5°F | Ht 71.5 in | Wt 263.8 lb

## 2017-08-05 DIAGNOSIS — Z0000 Encounter for general adult medical examination without abnormal findings: Secondary | ICD-10-CM

## 2017-08-05 DIAGNOSIS — M545 Low back pain: Secondary | ICD-10-CM | POA: Diagnosis not present

## 2017-08-05 DIAGNOSIS — E118 Type 2 diabetes mellitus with unspecified complications: Secondary | ICD-10-CM | POA: Diagnosis not present

## 2017-08-05 DIAGNOSIS — Z9989 Dependence on other enabling machines and devices: Secondary | ICD-10-CM

## 2017-08-05 DIAGNOSIS — Z7189 Other specified counseling: Secondary | ICD-10-CM

## 2017-08-05 DIAGNOSIS — I10 Essential (primary) hypertension: Secondary | ICD-10-CM

## 2017-08-05 DIAGNOSIS — G8929 Other chronic pain: Secondary | ICD-10-CM | POA: Diagnosis not present

## 2017-08-05 DIAGNOSIS — G4733 Obstructive sleep apnea (adult) (pediatric): Secondary | ICD-10-CM

## 2017-08-05 DIAGNOSIS — I2510 Atherosclerotic heart disease of native coronary artery without angina pectoris: Secondary | ICD-10-CM | POA: Diagnosis not present

## 2017-08-05 DIAGNOSIS — G4700 Insomnia, unspecified: Secondary | ICD-10-CM

## 2017-08-05 DIAGNOSIS — I739 Peripheral vascular disease, unspecified: Secondary | ICD-10-CM

## 2017-08-05 DIAGNOSIS — E785 Hyperlipidemia, unspecified: Secondary | ICD-10-CM

## 2017-08-05 MED ORDER — BACLOFEN 20 MG PO TABS: 20.0000 mg | ORAL_TABLET | Freq: Two times a day (BID) | ORAL | 1 refills | Status: DC | PRN

## 2017-08-05 MED ORDER — SIMVASTATIN 40 MG PO TABS: 40.0000 mg | ORAL_TABLET | Freq: Every day | ORAL | 3 refills | Status: DC

## 2017-08-05 MED ORDER — CLONAZEPAM 0.5 MG PO TABS: ORAL_TABLET | ORAL | 0 refills | Status: DC

## 2017-08-05 MED ORDER — LISINOPRIL 10 MG PO TABS: 10.0000 mg | ORAL_TABLET | Freq: Every day | ORAL | 3 refills | Status: DC

## 2017-08-05 MED ORDER — METOPROLOL SUCCINATE ER 50 MG PO TB24: 50.0000 mg | ORAL_TABLET | Freq: Every day | ORAL | 3 refills | Status: DC

## 2017-08-05 MED ORDER — LISINOPRIL 10 MG PO TABS
10.0000 mg | ORAL_TABLET | Freq: Every day | ORAL | 3 refills | Status: DC
Start: 2017-08-05 — End: 2018-08-28

## 2017-08-05 NOTE — Patient Instructions (Addendum)
Check with your insurance to see if they will cover the shingrix shot. I would get a flu shot each fall.   Recheck labs in about 6 months prior to a visit.   I wouldn't change your meds for now.   Ask Dr. Wyline Copas if he wants you back on imdur.   Call Dr. Wyline Copas about follow up- I can't feel the pulses in your feet.   Let me know when you want to go back for a colonoscopy- see about getting a driver.  Call about an eye exam when possible.   Take care.  Glad to see you.  Update me as needed.

## 2017-08-05 NOTE — Progress Notes (Signed)
Health maintenance: Not ready to quit smoking.  D/w pt.  Encouraged. Rare use of SABA.  Flu vaccine - encouraged.   Eye exam - due, d/w pt.   PCV13 - administered prev.  Hearing - failed, d/w pt.  Declined hearing aids.   Depression score: 1, mood d/w pt.   Mini-Cog score: 18/20 prev.  No red flag events.  Recheck today: Alert and oriented x3.  Normal math and watch reading, 2/3 recall, 3/3 with prompting.  Neither he nor I though he had sig memory loss.  PSA wnl.  D/w pt.   HCV neg.  Due for colonoscopy, d/w pt.  Deferred by patient.  He needs to check on getting a driver.  He'll update me if I need to refer.  Advance directive d/w pt.  Would have his son, daughter and brother equally designated if patient were incapacitated.    D/w pt about Imdur. Patient states he has not taken medication in previous 2 months. Patient needed clarification about correct dosage of Metoprolol Succinate- he was increased from 25 to 50mg  in 2017 per outside MD.  No CP.  D/w pt.  See AVS.    H/o DM2.  No meds.  A1c controlled.  Labs d/w pt.  Sugar occ checked, usually 115-130s usually.  He is eating less so he can lose weight, eating more veggies, less bread.  He has numbness in the feet but no claudication.   Elevated Cholesterol: Using medications without problems:yes Muscle aches: no Diet compliance: encouraged.   Exercise: encouraged.   Hypertension:    Using medication without problems or lightheadedness: yes Chest pain with exertion:no Edema:no Short of breath: at baseline, not worse, per patient assumed to be from smoking hx  Still on CPAP for OSA, compliant. Sleeping better, used every night.  Still on baclofen in the meantime for the spasms in his back w/o ADE, w/good effect.    Rare use of BZD in the meantime for insomnia.    No ADE on med.     PMH and SH reviewed  ROS: Per HPI unless specifically indicated in ROS section   Meds, vitals, and allergies reviewed.   GEN: nad, alert and  oriented HEENT: mucous membranes moist NECK: supple w/o LA CV: rrr PULM: ctab, no inc wob ABD: soft, +bs EXT: no edema SKIN: no acute rash  Diabetic foot exam: Normal inspection No skin breakdown No calluses but thickened skin on the heels Absent DP pulses but normal cap refill in the feet Normal sensation to light touch and monofilament but he has subjective numbness Nails thickened

## 2017-08-06 DIAGNOSIS — Z0000 Encounter for general adult medical examination without abnormal findings: Secondary | ICD-10-CM | POA: Insufficient documentation

## 2017-08-06 DIAGNOSIS — I739 Peripheral vascular disease, unspecified: Secondary | ICD-10-CM | POA: Insufficient documentation

## 2017-08-06 DIAGNOSIS — G4700 Insomnia, unspecified: Secondary | ICD-10-CM | POA: Insufficient documentation

## 2017-08-06 NOTE — Assessment & Plan Note (Signed)
Advance directive d/w pt.  Would have his son, daughter and brother equally designated if patient were incapacitated.

## 2017-08-06 NOTE — Assessment & Plan Note (Signed)
Rare use of BZD in the meantime for insomnia.    No ADE on med.   Continue as is.

## 2017-08-06 NOTE — Assessment & Plan Note (Signed)
Continue baclofen as needed for spasms. No adverse effect on medication.

## 2017-08-06 NOTE — Assessment & Plan Note (Signed)
I cannot feel a dorsalis pedis pulse in either foot. No claudication. No skin breakdown. I last for cardiology input. Discussed with patient.

## 2017-08-06 NOTE — Assessment & Plan Note (Signed)
Continue statin, continue work on diet and weight.  Labs d/w pt.

## 2017-08-06 NOTE — Assessment & Plan Note (Signed)
Flu vaccine - encouraged.   Eye exam - due, d/w pt.   PCV13 - administered prev.  Hearing - failed, d/w pt.  Declined hearing aids.   Depression score: 1, mood d/w pt.   Mini-Cog score: 18/20 prev.  No red flag events.  Recheck today: Alert and oriented x3.  Normal math and watch reading, 2/3 recall, 3/3 with prompting.  Neither he nor I though he had sig memory loss.  PSA wnl.  D/w pt.   HCV neg.  Due for colonoscopy, d/w pt.  Deferred by patient.  He needs to check on getting a driver.  He'll update me if I need to refer.  Advance directive d/w pt.  Would have his son, daughter and brother equally designated if patient were incapacitated.

## 2017-08-06 NOTE — Assessment & Plan Note (Signed)
No meds.  A1c controlled.  Labs d/w pt.  continue work on diet. Discussed with patient about routine foot care.

## 2017-08-06 NOTE — Assessment & Plan Note (Signed)
Continue current medications. See above. Labs discussed with patient.

## 2017-08-06 NOTE — Assessment & Plan Note (Addendum)
No chest pain. No change in meds. Will defer to cardiology about imdur. See after visit summary.  I cannot feel dorsalis pedis pulses in his feet. He still has normal capillary refill. No claudication. We will ask for cardiology input. >40 minutes spent in face to face time with patient, >50% spent in counselling or coordination of care, regarding diet, exercise, diabetes, smoking, obstructive sleep apnea, absence dorsalis pedis pulses, etc.

## 2017-08-06 NOTE — Assessment & Plan Note (Signed)
Continue CPAP.  D/w pt.

## 2017-08-20 DIAGNOSIS — E785 Hyperlipidemia, unspecified: Secondary | ICD-10-CM | POA: Diagnosis not present

## 2017-08-20 DIAGNOSIS — I501 Left ventricular failure: Secondary | ICD-10-CM | POA: Diagnosis not present

## 2017-08-20 DIAGNOSIS — I110 Hypertensive heart disease with heart failure: Secondary | ICD-10-CM | POA: Diagnosis not present

## 2017-08-20 DIAGNOSIS — I2510 Atherosclerotic heart disease of native coronary artery without angina pectoris: Secondary | ICD-10-CM | POA: Diagnosis not present

## 2017-08-27 NOTE — Telephone Encounter (Signed)
Completed 07/30/17

## 2017-09-15 ENCOUNTER — Telehealth: Payer: Self-pay | Admitting: Family Medicine

## 2017-09-15 DIAGNOSIS — R0989 Other specified symptoms and signs involving the circulatory and respiratory systems: Secondary | ICD-10-CM

## 2017-09-15 NOTE — Telephone Encounter (Signed)
Patient says he is not getting the doppler done because it has been like that for 5 years and Cardiology is not concerned.

## 2017-09-15 NOTE — Telephone Encounter (Signed)
I am concerned about the absence of pulses.  I would advise him to get the doppler done.  I didn't cancel it.

## 2017-09-15 NOTE — Telephone Encounter (Signed)
Patient called in stating he recently had an appointment with Cardiology and they said that his feet were warm and they were not concerned about the absence of pulses.  Please cancel doppler order.

## 2017-09-15 NOTE — Telephone Encounter (Signed)
Left detailed message on voicemail.  

## 2017-09-15 NOTE — Telephone Encounter (Signed)
We sent mult notes to cardiology about patient not having a pulse in either foot. Delayed message returned by fax asking for [place] to obtain lower ext doppler.  I can't tell from the note who (or even if a person) from cardiology reviewed our request for help on this patient.    Ordered doppler. We'll go from there.

## 2017-09-16 NOTE — Addendum Note (Signed)
Addended by: [name] on: 09/16/2017 05:27 AM   Modules accepted: Orders

## 2017-09-16 NOTE — Telephone Encounter (Addendum)
For charting purposes, this is against medical advice.  We can't make him go.  I wish him the best.  I cancelled the order.

## 2017-10-09 DIAGNOSIS — J189 Pneumonia, unspecified organism: Secondary | ICD-10-CM

## 2017-10-09 HISTORY — DX: Pneumonia, unspecified organism: J18.9

## 2017-11-08 DIAGNOSIS — R042 Hemoptysis: Secondary | ICD-10-CM | POA: Diagnosis not present

## 2017-11-10 ENCOUNTER — Ambulatory Visit (INDEPENDENT_AMBULATORY_CARE_PROVIDER_SITE_OTHER): Payer: PPO | Admitting: Family Medicine

## 2017-11-10 ENCOUNTER — Telehealth: Payer: Self-pay | Admitting: *Deleted

## 2017-11-10 ENCOUNTER — Ambulatory Visit (INDEPENDENT_AMBULATORY_CARE_PROVIDER_SITE_OTHER)
Admission: RE | Admit: 2017-11-10 | Discharge: 2017-11-10 | Disposition: A | Discharge status: Home / Self Care | Payer: PPO | Source: Ambulatory Visit | Attending: Family Medicine | Admitting: Family Medicine

## 2017-11-10 ENCOUNTER — Encounter: Payer: Self-pay | Admitting: *Deleted

## 2017-11-10 ENCOUNTER — Other Ambulatory Visit: Payer: Self-pay

## 2017-11-10 ENCOUNTER — Encounter: Payer: Self-pay | Admitting: Family Medicine

## 2017-11-10 VITALS — BP 130/64 | HR 73 | Temp 97.6°F | Ht 71.5 in | Wt 262.2 lb

## 2017-11-10 DIAGNOSIS — G4700 Insomnia, unspecified: Secondary | ICD-10-CM | POA: Diagnosis not present

## 2017-11-10 DIAGNOSIS — J449 Chronic obstructive pulmonary disease, unspecified: Secondary | ICD-10-CM | POA: Diagnosis not present

## 2017-11-10 DIAGNOSIS — R042 Hemoptysis: Secondary | ICD-10-CM | POA: Diagnosis not present

## 2017-11-10 DIAGNOSIS — R911 Solitary pulmonary nodule: Secondary | ICD-10-CM | POA: Diagnosis not present

## 2017-11-10 DIAGNOSIS — J189 Pneumonia, unspecified organism: Secondary | ICD-10-CM

## 2017-11-10 DIAGNOSIS — R918 Other nonspecific abnormal finding of lung field: Secondary | ICD-10-CM

## 2017-11-10 DIAGNOSIS — J4489 Other specified chronic obstructive pulmonary disease: Secondary | ICD-10-CM

## 2017-11-10 MED ORDER — CLONAZEPAM 0.5 MG PO TABS: ORAL_TABLET | ORAL | 0 refills | Status: DC

## 2017-11-10 MED ORDER — LEVOFLOXACIN 500 MG PO TABS: 500.0000 mg | ORAL_TABLET | Freq: Every day | ORAL | 0 refills | Status: DC

## 2017-11-10 MED ORDER — PREDNISONE 20 MG PO TABS: ORAL_TABLET | ORAL | 0 refills | Status: DC

## 2017-11-10 NOTE — Telephone Encounter (Signed)
Christian [name] called with call report. The report is in Epic for MD to review. Radiologist recommends CT of chest with contrast.

## 2017-11-10 NOTE — Telephone Encounter (Signed)
noted 

## 2017-11-10 NOTE — Progress Notes (Addendum)
Dr. Frederico Hamman T. Shellyann Wandrey, MD, [facility] [place], [address] Phone: [phone] Fax: [phone]  11/10/2017  Patient: Christian [name], MRN: [id number], DOB: 1946/03/05, 71 y.o.  Primary Physician:  [name], MD   Chief Complaint  Patient presents with  . Pneumonia    Seen at [facility] on Saturday-Not on any medications-Also a spot on his lung was seen on X-ray-Patient concerned about that  . Medication Refill    Clonazepam   Subjective:   Christian [name] is a 71 y.o. very pleasant male patient who presents with the following:  He presents for a productive cough that is been present for about a week.  He also notes that he has been having some significant hemoptysis, but he was having some prior to this for the few weeks prior.  He generally feels very poorly, tired, fatigued, and he is having some shortness of breath in general.  He went to urgent care on Saturday, but it appears there may have been some confusion and he did not pick up his medication or there was some communication breakdown.  He was told that he had pneumonia.  I am unable to read their chest x-ray through their disc software.  Has been smoking all of his life, since 34. 50+ years of smoking.   hemoptysis  Past Medical History, Surgical History, Social History, Family History, Problem List, Medications, and Allergies have been reviewed and updated if relevant.  Patient Active Problem List   Diagnosis Date Noted  . Health care maintenance 08/06/2017  . Insomnia 08/06/2017  . PVD (peripheral vascular disease) ([place]) 08/06/2017  . Advance care planning 01/29/2017  . OSA on CPAP 01/29/2017  . Rash 01/29/2017  . Diabetes mellitus type 2 with complications ([place])   . Chest pain 05/05/2016  . LV dysfunction 12/13/2015  . CAD (coronary artery disease) 12/12/2015  . History of cardiac cath 12/12/2015  . Obstructive chronic bronchitis  without exacerbation ([place]) 07/28/2014  . Anxiety state 07/28/2014  . Essential hypertension, benign 07/28/2014  . Hyperlipidemia 07/28/2014  . Chronic low back pain 07/28/2014    Past Medical History:  Diagnosis Date  . Anxiety   . Cancer ([place])    skin  . Diabetes mellitus type 2 with complications (HCC)    known CAD.   Marland Kitchen History of shingles    2004  . Hyperlipidemia   . Hypertension   . OSA on CPAP     Past Surgical History:  Procedure Laterality Date  . CARDIAC CATHETERIZATION     2010 @ [facility] (per pt)  . EYE SURGERY Bilateral    cataracts  . OPEN REDUCTION INTERNAL FIXATION (ORIF) DISTAL RADIAL FRACTURE Right 11/26/2013   Procedure: OPEN REDUCTION INTERNAL FIXATION (ORIF) DISTAL RADIAL FRACTURE;  Surgeon: Newt Minion, MD;  Location: [place];  Service: Orthopedics;  Laterality: Right;  Open Reduction Internal Fixation Right Distal Radius     Social History   Socioeconomic History  . Marital status: Widowed    Spouse name: Not on file  . Number of children: Not on file  . Years of education: Not on file  . Highest education level: Not on file  Social Needs  . Financial resource strain: Not on file  . Food insecurity - worry: Not on file  . Food insecurity - inability: Not on file  . Transportation needs - medical: Not on file  . Transportation needs -  non-medical: Not on file  Occupational History  . Not on file  Tobacco Use  . Smoking status: Current Every Day Smoker    Packs/day: 1.00    Years: 40.00    Pack years: 40.00    Types: Cigarettes  . Smokeless tobacco: Never Used  Substance and Sexual Activity  . Alcohol use: Yes    Alcohol/week: 4.2 oz    Types: 7 Glasses of wine per week    Comment: 1 glass of wine per night  . Drug use: No  . Sexual activity: Not on file  Other Topics Concern  . 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 Heart disease Mother   . Heart disease Father   . Colon cancer Father        possible colon or prostate cancer, dx'd in his 53s, patient wasn't sure of source.   . Prostate cancer Father        possible colon or prostate cancer, dx'd in his 43s, patient wasn't sure of source.   Marland Kitchen Heart disease Brother   . Diabetes Brother   . Kidney disease Brother   . Heart disease Sister   . Cancer Sister     Allergies  Allergen Reactions  . Celebrex [Celecoxib] Itching    Medication list reviewed and updated in full in [place].  ROS: GEN: Acute illness details above GI: Tolerating PO intake GU: maintaining adequate hydration and urination Pulm: + SOB Interactive and getting along well at home.  Otherwise, ROS is as per the HPI.  Objective:   BP 130/64   Pulse 73   Temp 97.6 F (36.4 C) (Oral)   Ht 5' 11.5" (1.816 m)   Wt 262 lb 4 oz (119 kg)   SpO2 94%   BMI 36.07 kg/m    GEN: A and O x 3. WDWN. NAD.    ENT: Nose clear, ext NML.  No LAD.  No JVD.  TM's clear. Oropharynx clear.  PULM: Normal WOB, no distress. No crackles, diffuse rhonchi with scattered wheezing CV: RRR, no M/G/R, No rubs, No JVD.   EXT: warm and well-perfused, No c/c/e. PSYCH: Pleasant and conversant.    Laboratory and Imaging Data:  Assessment and Plan:   Hemoptysis - Plan: DG Chest 2 View  Insomnia, unspecified type - Plan: clonazePAM (KLONOPIN) 0.5 MG tablet  Community acquired pneumonia, unspecified laterality - Plan: DG Chest 2 View  Obstructive chronic bronchitis without exacerbation ([place])  CXR pending LVQ + steroids 50 years of smoking  Addendum: 11/10/2017 Dg Chest 2 View  Result Date: 11/10/2017 CLINICAL DATA:  Bronchiectasis versus pneumonia. Hemoptysis. Smoker. EXAM: CHEST  2 VIEW COMPARISON:  Chest radiograph May 05, 2017 FINDINGS: Spiculated density RIGHT upper  lobe/perihilar approximate 2.5 cm nodule. Mild chronic interstitial changes and increased lung volumes without pleural effusion. Cardiac silhouette is normal in size. Calcified aortic knob. No pneumothorax. Old RIGHT rib fractures. Mild degenerative change of thoracic spine. IMPRESSION: New RIGHT upper lobe/ perihilar nodule with suspicious imaging features. Recommend CT chest with contrast. COPD. These results will be called to the ordering clinician or representative by the Radiologist Assistant, and communication documented in the PACS or zVision Dashboard. Aortic Atherosclerosis (ICD10-I70.0). Electronically Signed  By: Elon Alas M.D.   On: 11/10/2017 15:18    With CXR above and hemoptysis, we will obtain a CT of the chest with contrast to evaluate for potential malignancy in this setting with ongoing hemoptysis, as well.   Follow-up: No Follow-up on file.  No future appointments.  Meds ordered this encounter  Medications  . clonazePAM (KLONOPIN) 0.5 MG tablet    Sig: TAKE ONE TABLET BY MOUTH AT BEDTIME AS NEEDED FOR ANXIETY OR INSOMNIA    Dispense:  30 tablet    Refill:  0  . levofloxacin (LEVAQUIN) 500 MG tablet    Sig: Take 1 tablet (500 mg total) by mouth daily.    Dispense:  10 tablet    Refill:  0  . predniSONE (DELTASONE) 20 MG tablet    Sig: 1 tab po for 7 days    Dispense:  7 tablet    Refill:  0   Medications Discontinued During This Encounter  Medication Reason  . clonazePAM (KLONOPIN) 0.5 MG tablet Reorder  . levofloxacin (LEVAQUIN) 750 MG tablet    Orders Placed This Encounter  Procedures  . DG Chest 2 View    Signed,  Frederico Hamman T. Earma Nicolaou, MD   Allergies as of 11/10/2017      Reactions   Celebrex [celecoxib] Itching      Medication List        Accurate as of 11/10/17  1:25 PM. Always use your most recent med list.          albuterol 108 (90 Base) MCG/ACT inhaler Commonly known as:  PROVENTIL HFA;VENTOLIN HFA Inhale 2 puffs into the lungs  every 6 (six) hours as needed for wheezing or shortness of breath.   aspirin 325 MG tablet Take 325 mg by mouth daily.   baclofen 20 MG tablet Commonly known as:  LIORESAL Take 1 tablet (20 mg total) by mouth 2 (two) times daily as needed for muscle spasms.   clonazePAM 0.5 MG tablet Commonly known as:  KLONOPIN TAKE ONE TABLET BY MOUTH AT BEDTIME AS NEEDED FOR ANXIETY OR INSOMNIA   ketoconazole 2 % cream Commonly known as:  NIZORAL Apply 1 application topically 2 (two) times daily.   levofloxacin 500 MG tablet Commonly known as:  LEVAQUIN Take 1 tablet (500 mg total) by mouth daily.   lisinopril 10 MG tablet Commonly known as:  PRINIVIL,ZESTRIL Take 1 tablet (10 mg total) by mouth daily.   MELATONIN + L-THEANINE PO Take 1 tablet by mouth at bedtime as needed (sleep).   metoprolol succinate 50 MG 24 hr tablet Commonly known as:  TOPROL-XL Take 1 tablet (50 mg total) by mouth daily. Take with or immediately following a meal.   nitroGLYCERIN 0.4 MG SL tablet Commonly known as:  NITROSTAT Place 1 tablet (0.4 mg total) under the tongue every 5 (five) minutes as needed for chest pain.   predniSONE 20 MG tablet Commonly known as:  DELTASONE 1 tab po for 7 days   simvastatin 40 MG tablet Commonly known as:  ZOCOR Take 1 tablet (40 mg total) by mouth at bedtime.   vitamin E 100 UNIT capsule Take 100 Units by mouth daily.

## 2017-11-10 NOTE — Addendum Note (Signed)
Addended by: Owens Loffler on: 11/10/2017 05:00 PM   Modules accepted: Orders

## 2017-11-11 ENCOUNTER — Ambulatory Visit
Admission: RE | Admit: 2017-11-11 | Discharge: 2017-11-11 | Disposition: A | Discharge status: Home / Self Care | Payer: PPO | Source: Ambulatory Visit | Attending: Family Medicine | Admitting: Family Medicine

## 2017-11-11 DIAGNOSIS — J449 Chronic obstructive pulmonary disease, unspecified: Secondary | ICD-10-CM

## 2017-11-11 DIAGNOSIS — R911 Solitary pulmonary nodule: Secondary | ICD-10-CM

## 2017-11-11 DIAGNOSIS — R918 Other nonspecific abnormal finding of lung field: Secondary | ICD-10-CM

## 2017-11-11 DIAGNOSIS — J189 Pneumonia, unspecified organism: Secondary | ICD-10-CM

## 2017-11-11 DIAGNOSIS — R042 Hemoptysis: Secondary | ICD-10-CM

## 2017-11-11 DIAGNOSIS — G4700 Insomnia, unspecified: Secondary | ICD-10-CM

## 2017-11-11 MED ORDER — IOPAMIDOL (ISOVUE-300) INJECTION 61%
75.0000 mL | Freq: Once | INTRAVENOUS | Status: AC | PRN
  Administered 2017-11-11: 75 mL via INTRAVENOUS

## 2017-11-12 ENCOUNTER — Telehealth: Payer: Self-pay

## 2017-11-12 ENCOUNTER — Other Ambulatory Visit: Payer: Self-pay | Admitting: Family Medicine

## 2017-11-12 DIAGNOSIS — I712 Thoracic aortic aneurysm, without rupture, unspecified: Secondary | ICD-10-CM

## 2017-11-12 DIAGNOSIS — R05 Cough: Secondary | ICD-10-CM

## 2017-11-12 DIAGNOSIS — R918 Other nonspecific abnormal finding of lung field: Secondary | ICD-10-CM

## 2017-11-12 DIAGNOSIS — R911 Solitary pulmonary nodule: Secondary | ICD-10-CM

## 2017-11-12 DIAGNOSIS — R062 Wheezing: Secondary | ICD-10-CM

## 2017-11-12 DIAGNOSIS — R059 Cough, unspecified: Secondary | ICD-10-CM

## 2017-11-12 NOTE — Telephone Encounter (Signed)
Call pt.   4.9 x 2.0 cm density seen in right lower lobe concerning for cancer.  PET scan is recommended for further evaluation.  He also has a 4.2 cm ascending thoracic aortic aneurysm. Recommend annual imaging followup by CTA or MRA. I put a reminder in the EMR about that.    The mass is the bigger deal and needs f/u imaging with the PET scan.   I ordered that.   Thanks.

## 2017-11-12 NOTE — Telephone Encounter (Signed)
Patient notified as instructed by telephone and verbalized understanding. Patient stated that he is leaving to go to [facility] next Wednesday for a week. Advised patient that Rosaria Ferries the referral coordinator will be in touch with him to get the test scheduled for him.

## 2017-11-12 NOTE — Telephone Encounter (Signed)
Results show a 4.9 cm Right Lower Lobe Mass consistent with malignancy. Recommend a PET Scan

## 2017-11-12 NOTE — Progress Notes (Signed)
Ordered.  Thanks.   Old order cancelled.

## 2017-11-12 NOTE — Telephone Encounter (Signed)
agree

## 2017-11-13 ENCOUNTER — Encounter: Payer: Self-pay | Admitting: Family Medicine

## 2017-11-13 ENCOUNTER — Ambulatory Visit (INDEPENDENT_AMBULATORY_CARE_PROVIDER_SITE_OTHER): Payer: PPO | Admitting: Family Medicine

## 2017-11-13 DIAGNOSIS — R918 Other nonspecific abnormal finding of lung field: Secondary | ICD-10-CM | POA: Diagnosis not present

## 2017-11-13 NOTE — Progress Notes (Signed)
Still with some cough, but no more hemoptysis.  He had scant hemoptysis in the last few weeks in the AM only.  He isn't more SOB- "I've been SOB for years."  SOB is stable.  No fevers.  No discoloration of sputum, has less sputum overall and clear.  Still on abx and prednisone, d/w pt about taking with food.    Imaging d/w pt.   Meds, vitals, and allergies reviewed.   ROS: Per HPI unless specifically indicated in ROS section   nad ncat Neck supple, no LA rrr Ctab, no wheeze, no focal dec in BS Ext w/o edema.

## 2017-11-13 NOTE — Patient Instructions (Signed)
I'll await the results from the PET scan and we'll go from there.  Take care.  Glad to see you.

## 2017-11-13 NOTE — Assessment & Plan Note (Signed)
4.9 x 2.0 cm density seen in right lower lobe concerning for cancer.  PET scan is recommended for further evaluation.  He also has a 4.2 cm ascending thoracic aortic aneurysm. Recommend annual imaging followup by CTA or MRA.   The mass is the bigger deal and needs f/u imaging with the PET scan.   All questions answered about imaging, images reviewed with patient at the [facility], prev CXR and CT.  Rationale for PET d/w pt, with possible biopsy thereafter.  We'll be in touch when I get the PET results.  He agrees. >25 minutes spent in face to face time with patient, >50% spent in counselling or coordination of care.

## 2017-11-18 ENCOUNTER — Ambulatory Visit
Admission: RE | Admit: 2017-11-18 | Discharge: 2017-11-18 | Disposition: A | Discharge status: Home / Self Care | Payer: PPO | Source: Ambulatory Visit | Attending: Family Medicine | Admitting: Family Medicine

## 2017-11-18 ENCOUNTER — Other Ambulatory Visit: Payer: Self-pay | Admitting: Family Medicine

## 2017-11-18 DIAGNOSIS — R935 Abnormal findings on diagnostic imaging of other abdominal regions, including retroperitoneum: Secondary | ICD-10-CM | POA: Diagnosis not present

## 2017-11-18 DIAGNOSIS — Z79899 Other long term (current) drug therapy: Secondary | ICD-10-CM | POA: Insufficient documentation

## 2017-11-18 DIAGNOSIS — R918 Other nonspecific abnormal finding of lung field: Secondary | ICD-10-CM

## 2017-11-18 DIAGNOSIS — Z7982 Long term (current) use of aspirin: Secondary | ICD-10-CM | POA: Insufficient documentation

## 2017-11-18 LAB — GLUCOSE, CAPILLARY: Glucose-Capillary: 139 mg/dL — ABNORMAL HIGH (ref 65–99)

## 2017-11-18 MED ORDER — FLUDEOXYGLUCOSE F - 18 (FDG) INJECTION
12.8200 | Freq: Once | INTRAVENOUS | Status: AC | PRN
  Administered 2017-11-18: 12.82 via INTRAVENOUS

## 2017-11-19 ENCOUNTER — Telehealth: Payer: Self-pay | Admitting: Family Medicine

## 2017-11-19 NOTE — Telephone Encounter (Signed)
Left message on answering machine for patient to call back.

## 2017-11-19 NOTE — Telephone Encounter (Signed)
Copied from [place] [id number]. Topic: Quick Communication - See Telephone Encounter >> Nov 19, 2017 10:16 AM Oneta Rack wrote: CRM for notification. See Telephone encounter for:   11/19/17.  Relation to pt: self  Call back number: [phone]  Reason for call:  Patient inquiring about imaging results conducted 11/18/17, advised patient office was closed yesterday and delay opening today, patient voice understanding and stated he's going out of town today at 2 pm and would like to speak with nurse regarding results, please advise

## 2017-11-19 NOTE — Telephone Encounter (Signed)
Called patient back and test results given. See result note.

## 2017-11-26 ENCOUNTER — Telehealth: Payer: Self-pay | Admitting: Family Medicine

## 2017-11-26 NOTE — Telephone Encounter (Signed)
Copied from [place]. Topic: Quick Communication - Lab Results >> Nov 26, 2017 11:22 AM Darl Householder, RMA wrote: Patient is requesting results from PET scan, please call pt with results

## 2017-11-26 NOTE — Telephone Encounter (Signed)
Left message on answering machine for patient to call back.  See results which were given to patient on 11/19/17.

## 2017-11-28 NOTE — Telephone Encounter (Signed)
Results given to patient again as instructed and verbalized uinderstanding.

## 2017-12-03 ENCOUNTER — Other Ambulatory Visit: Payer: Self-pay | Admitting: Family Medicine

## 2017-12-04 ENCOUNTER — Telehealth: Payer: Self-pay | Admitting: Family Medicine

## 2017-12-04 NOTE — Telephone Encounter (Signed)
Please have this go back to triage and get update on patient.  If he has concern about restarting abx, then likely needs to be rechecked.  Thanks.

## 2017-12-04 NOTE — Telephone Encounter (Signed)
Noted. Thanks. He declined OV at [facility] today.   I appreciate the help of all involved.

## 2017-12-04 NOTE — Telephone Encounter (Signed)
Attempted to contact pt. Lm on pts vm and advised we will forward to Dr Damita Dunnings to verify if an OV is required. Last OV 11/13/2017

## 2017-12-04 NOTE — Telephone Encounter (Signed)
I spoke with pt and he is no better,wheezing and SOB. No available appts at [facility]; pt scheduled appt with Dr Quay Burow on 12/05/17 at 2 pm. If pt condition worsens prior to appt pt will go to ED. FYI to Dr Damita Dunnings and Dr Quay Burow.

## 2017-12-04 NOTE — Telephone Encounter (Signed)
Left message on voice mail: regarding the reason for the need of refill of antibiotic. Did let patient know that Rx may require an office visit- requested call back with symptoms- will forward request.

## 2017-12-04 NOTE — Telephone Encounter (Signed)
Copied from [place]. Topic: Quick Communication - See Telephone Encounter >> Dec 04, 2017 10:48 AM Corie Chiquito, NT wrote: CRM for notification. See Telephone encounter for: Patient needs a refill on his Levofloxacin. Patient has been in contact with his pharmacy about the medication as well. If someone could give him a call back at [phone]  12/04/17.

## 2017-12-05 ENCOUNTER — Ambulatory Visit: Payer: PPO | Admitting: Internal Medicine

## 2017-12-05 ENCOUNTER — Encounter: Payer: Self-pay | Admitting: Family

## 2017-12-05 ENCOUNTER — Ambulatory Visit: Payer: PPO | Admitting: Family

## 2017-12-05 VITALS — BP 120/66 | HR 71 | Temp 98.8°F | Resp 20 | Ht 71.5 in | Wt 264.0 lb

## 2017-12-05 DIAGNOSIS — R05 Cough: Secondary | ICD-10-CM

## 2017-12-05 DIAGNOSIS — R058 Other specified cough: Secondary | ICD-10-CM

## 2017-12-05 MED ORDER — DOXYCYCLINE HYCLATE 100 MG PO TABS
100.0000 mg | ORAL_TABLET | Freq: Two times a day (BID) | ORAL | 0 refills | Status: DC
Start: 2017-12-05 — End: 2018-01-07

## 2017-12-05 NOTE — Patient Instructions (Signed)
Please start using your albuterol inhaler to help with the cough/ wheezing;

## 2017-12-08 NOTE — Progress Notes (Signed)
Christian [name] is a 71 y.o. male with the following history as recorded in EpicCare:  Patient Active Problem List   Diagnosis Date Noted  . Lung mass 11/12/2017  . Thoracic aortic aneurysm ([place]) 11/12/2017  . Health care maintenance 08/06/2017  . Insomnia 08/06/2017  . PVD (peripheral vascular disease) ([place]) 08/06/2017  . Advance care planning 01/29/2017  . OSA on CPAP 01/29/2017  . Rash 01/29/2017  . Diabetes mellitus type 2 with complications ([place])   . Chest pain 05/05/2016  . LV dysfunction 12/13/2015  . CAD (coronary artery disease) 12/12/2015  . History of cardiac cath 12/12/2015  . Obstructive chronic bronchitis without exacerbation ([place]) 07/28/2014  . Anxiety state 07/28/2014  . Essential hypertension, benign 07/28/2014  . Hyperlipidemia 07/28/2014  . Chronic low back pain 07/28/2014    Current Outpatient Medications  Medication Sig Dispense Refill  . albuterol (PROVENTIL HFA;VENTOLIN HFA) 108 (90 BASE) MCG/ACT inhaler Inhale 2 puffs into the lungs every 6 (six) hours as needed for wheezing or shortness of breath. 1 Inhaler 3  . aspirin 325 MG tablet Take 325 mg by mouth daily.    . baclofen (LIORESAL) 20 MG tablet Take 1 tablet (20 mg total) by mouth 2 (two) times daily as needed for muscle spasms. 180 each 1  . clonazePAM (KLONOPIN) 0.5 MG tablet TAKE ONE TABLET BY MOUTH AT BEDTIME AS NEEDED FOR ANXIETY OR INSOMNIA 30 tablet 0  . ketoconazole (NIZORAL) 2 % cream Apply 1 application topically 2 (two) times daily. 60 g 1  . levofloxacin (LEVAQUIN) 500 MG tablet Take 1 tablet (500 mg total) by mouth daily. 10 tablet 0  . lisinopril (PRINIVIL,ZESTRIL) 10 MG tablet Take 1 tablet (10 mg total) by mouth daily. 90 tablet 3  . Melaton-Thean-Cham-PassF-LBalm (MELATONIN + L-THEANINE PO) Take 1 tablet by mouth at bedtime as needed (sleep).     . metoprolol succinate (TOPROL-XL) 50 MG 24 hr tablet Take 1 tablet (50 mg total) by mouth daily. Take with or immediately following a meal. 90  tablet 3  . nitroGLYCERIN (NITROSTAT) 0.4 MG SL tablet Place 1 tablet (0.4 mg total) under the tongue every 5 (five) minutes as needed for chest pain. 25 tablet 1  . predniSONE (DELTASONE) 20 MG tablet 1 tab po for 7 days 7 tablet 0  . simvastatin (ZOCOR) 40 MG tablet Take 1 tablet (40 mg total) by mouth at bedtime. 90 tablet 3  . vitamin E 100 UNIT capsule Take 100 Units by mouth daily.    Marland Kitchen doxycycline (VIBRA-TABS) 100 MG tablet Take 1 tablet (100 mg total) by mouth 2 (two) times daily. 20 tablet 0   No current facility-administered medications for this visit.     Allergies: Celebrex [celecoxib]  Past Medical History:  Diagnosis Date  . Anxiety   . Cancer ([place])    skin  . Diabetes mellitus type 2 with complications (HCC)    known CAD.   Marland Kitchen History of shingles    2004  . Hyperlipidemia   . Hypertension   . OSA on CPAP     Past Surgical History:  Procedure Laterality Date  . CARDIAC CATHETERIZATION     2010 @ [facility] (per pt)  . EYE SURGERY Bilateral    cataracts  . OPEN REDUCTION INTERNAL FIXATION (ORIF) DISTAL RADIAL FRACTURE Right 11/26/2013   Procedure: OPEN REDUCTION INTERNAL FIXATION (ORIF) DISTAL RADIAL FRACTURE;  Surgeon: Newt Minion, MD;  Location: [facility];  Service: Orthopedics;  Laterality: Right;  Open  Reduction Internal Fixation Right Distal Radius     Family History  Problem Relation Age of Onset  . Heart disease Mother   . Heart disease Father   . Colon cancer Father        possible colon or prostate cancer, dx'd in his 69s, patient wasn't sure of source.   . Prostate cancer Father        possible colon or prostate cancer, dx'd in his 87s, patient wasn't sure of source.   Marland Kitchen Heart disease Brother   . Diabetes Brother   . Kidney disease Brother   . Heart disease Sister   . Cancer Sister     Social History   Tobacco Use  . Smoking status: Current Every Day Smoker    Packs/day: 1.00    Years: 40.00    Pack years: 40.00    Types: Cigarettes  .  Smokeless tobacco: Never Used  Substance Use Topics  . Alcohol use: Yes    Alcohol/week: 4.2 oz    Types: 7 Glasses of wine per week    Comment: 1 glass of wine per night    Subjective:  Patient recently diagnosed with pneumonia and suspect lung cancer; completed course of Levaquin but cough is still persisting; notes there has been some improvement but continuing with productive cough; denies any fever; already has an inhaler; scheduled to see cardiothoracic surgeon in follow-up on 12/10/17.   Objective:  Vitals:   12/05/17 1615  BP: 120/66  Pulse: 71  Resp: 20  Temp: 98.8 F (37.1 C)  TempSrc: Oral  SpO2: 96%  Weight: 264 lb (119.7 kg)  Height: 5' 11.5" (1.816 m)    General: Well developed, well nourished, in no acute distress  Skin : Warm and dry.  Head: Normocephalic and atraumatic  Eyes: Sclera and conjunctiva clear; pupils round and reactive to light; extraocular movements intact  Ears: External normal; canals clear; tympanic membranes normal  Oropharynx: Pink, supple. No suspicious lesions  Neck: Supple without thyromegaly, adenopathy  Lungs: Respirations unlabored; wheezes noted CVS exam: normal rate and regular rhythm.  Neurologic: Alert and oriented; speech intact; face symmetrical; moves all extremities well; CNII-XII intact without focal deficit  Assessment:  1. Cough present for greater than 3 weeks     Plan:  Suspected lung cancer with recent history of pneumonia; will re-treat with Doxycycline 100 mg bid x 10 days; stressed need to quit smoking; use albuterol inhaler as needed; keep planned follow-up with surgeon.   No Follow-up on file.  No orders of the defined types were placed in this encounter.   Requested Prescriptions   Signed Prescriptions Disp Refills  . doxycycline (VIBRA-TABS) 100 MG tablet 20 tablet 0    Sig: Take 1 tablet (100 mg total) by mouth 2 (two) times daily.

## 2017-12-10 ENCOUNTER — Other Ambulatory Visit: Payer: Self-pay | Admitting: *Deleted

## 2017-12-10 ENCOUNTER — Encounter: Payer: Self-pay | Admitting: Thoracic Surgery (Cardiothoracic Vascular Surgery)

## 2017-12-10 ENCOUNTER — Institutional Professional Consult (permissible substitution): Payer: PPO | Admitting: Thoracic Surgery (Cardiothoracic Vascular Surgery)

## 2017-12-10 VITALS — BP 145/80 | HR 80 | Resp 18 | Ht 73.0 in | Wt 260.0 lb

## 2017-12-10 DIAGNOSIS — R918 Other nonspecific abnormal finding of lung field: Secondary | ICD-10-CM

## 2017-12-10 DIAGNOSIS — I2510 Atherosclerotic heart disease of native coronary artery without angina pectoris: Secondary | ICD-10-CM | POA: Diagnosis not present

## 2017-12-10 DIAGNOSIS — I712 Thoracic aortic aneurysm, without rupture: Secondary | ICD-10-CM | POA: Diagnosis not present

## 2017-12-10 DIAGNOSIS — I7121 Aneurysm of the ascending aorta, without rupture: Secondary | ICD-10-CM

## 2017-12-10 NOTE — Progress Notes (Signed)
PCP is [name], MD Referring Provider is [name], MD  Chief Complaint  Patient presents with  . Lung Mass    Surgical eval, Chest CT 11/12/17, PET Scan 11/18/17    HPI: Christian [name] is a 72 year old gentleman with a history of tobacco abuse (1.5 pack/day x 53 years), coronary artery disease, type 2 diabetes mellitus with peripheral neuropathy, skin cancer, hypertension, hyperlipidemia, and obstructive sleep apnea.  Around Thanksgiving he became ill.  He developed a cough.  He thought he had the flu.  He did not improve and he went to see Dr. Damita Dunnings.  He was treated with antibiotics.  A chest x-ray showed a question of a right lower lobe mass.  A CT of the chest was done which revealed a 5 x 2 cm right lower lobe mass.  There is no mediastinal or hilar adenopathy.  A PET/CT showed the mass was hypermetabolic with an SUV of 5.4.  There was no evidence of regional or distant metastases.  His hemoptysis has resolved with antibiotics.  He gets short of breath with exertion.  He sometimes feels short of breath at rest as well.  He says he gets short of breath with walking less than 100 yards.  It is relieved by resting for 30 seconds to a couple of minutes.  He also gets pressure in his calves and thighs with walking but also relieved by rest.  He does have wheezing.  His appetite is good he is lost about 5 pounds over the past 3 months.  He does complain of decreased energy.  Reviewing his chart, he was admitted to [facility] back in May 2017.  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  He was advised to have cardiac catheterization but did not follow up with the [facility] cardiologists.  He is not currently having any chest pain, pressure, or tightness.  His activity is very limited.  He still smoking 1 pack of cigarettes daily.  Zubrod Score: At the time of surgery this patient's most appropriate activity status/level should be described as: []     0     Normal activity, no symptoms []     1    Restricted in physical strenuous activity but ambulatory, able to do out light work [x]     2    Ambulatory and capable of self care, unable to do work activities, up and about >50 % of waking hours                              []     3    Only limited self care, in bed greater than 50% of waking hours []     4    Completely disabled, no self care, confined to bed or chair []     5    Moribund  Past Medical History:  Diagnosis Date  . Anxiety   . Cancer ([place])    skin  . Diabetes mellitus type 2 with complications ([facility])    known CAD.   Marland Kitchen History of shingles    2004  . Hyperlipidemia   . Hypertension   . OSA on CPAP     Past Surgical History:  Procedure Laterality Date  . CARDIAC CATHETERIZATION     2010 @ [facility] (per pt)  . EYE SURGERY Bilateral    cataracts  . OPEN REDUCTION INTERNAL FIXATION (ORIF) DISTAL RADIAL FRACTURE  Right 11/26/2013   Procedure: OPEN REDUCTION INTERNAL FIXATION (ORIF) DISTAL RADIAL FRACTURE;  Surgeon: Newt Minion, MD;  Location: [facility];  Service: Orthopedics;  Laterality: Right;  Open Reduction Internal Fixation Right Distal Radius     Family History  Problem Relation Age of Onset  . Heart disease Mother   . Heart disease Father   . Colon cancer Father        possible colon or prostate cancer, dx'd in his 73s, patient wasn't sure of source.   . Prostate cancer Father        possible colon or prostate cancer, dx'd in his 15s, patient wasn't sure of source.   Marland Kitchen Heart disease Brother   . Diabetes Brother   . Kidney disease Brother   . Heart disease Sister   . Cancer Sister     Social History Social History   Tobacco Use  . Smoking status: Current Every Day Smoker    Packs/day: 1.00    Years: 40.00    Pack years: 40.00    Types: Cigarettes  . Smokeless tobacco: Never Used  Substance Use Topics  . Alcohol use: Yes    Alcohol/week: 4.2 oz    Types: 7 Glasses of wine per week    Comment: 1  glass of wine per night  . Drug use: No  See HPI for more accurate smoking history  Current Outpatient Medications  Medication Sig Dispense Refill  . albuterol (PROVENTIL HFA;VENTOLIN HFA) 108 (90 BASE) MCG/ACT inhaler Inhale 2 puffs into the lungs every 6 (six) hours as needed for wheezing or shortness of breath. 1 Inhaler 3  . aspirin 325 MG tablet Take 325 mg by mouth daily.    . baclofen (LIORESAL) 20 MG tablet Take 1 tablet (20 mg total) by mouth 2 (two) times daily as needed for muscle spasms. 180 each 1  . clonazePAM (KLONOPIN) 0.5 MG tablet TAKE ONE TABLET BY MOUTH AT BEDTIME AS NEEDED FOR ANXIETY OR INSOMNIA 30 tablet 0  . doxycycline (VIBRA-TABS) 100 MG tablet Take 1 tablet (100 mg total) by mouth 2 (two) times daily. 20 tablet 0  . ketoconazole (NIZORAL) 2 % cream Apply 1 application topically 2 (two) times daily. 60 g 1  . lisinopril (PRINIVIL,ZESTRIL) 10 MG tablet Take 1 tablet (10 mg total) by mouth daily. 90 tablet 3  . Melaton-Thean-Cham-PassF-LBalm (MELATONIN + L-THEANINE PO) Take 1 tablet by mouth at bedtime as needed (sleep).     . metoprolol succinate (TOPROL-XL) 50 MG 24 hr tablet Take 1 tablet (50 mg total) by mouth daily. Take with or immediately following a meal. 90 tablet 3  . nitroGLYCERIN (NITROSTAT) 0.4 MG SL tablet Place 1 tablet (0.4 mg total) under the tongue every 5 (five) minutes as needed for chest pain. 25 tablet 1  . simvastatin (ZOCOR) 40 MG tablet Take 1 tablet (40 mg total) by mouth at bedtime. 90 tablet 3  . vitamin E 100 UNIT capsule Take 100 Units by mouth daily.     No current facility-administered medications for this visit.     Allergies  Allergen Reactions  . Celebrex [Celecoxib] Itching    Review of Systems  Constitutional: Positive for activity change, fever and unexpected weight change. Negative for chills.  HENT: Positive for dental problem and trouble swallowing. Negative for voice change.   Eyes: Negative for visual disturbance.   Respiratory: Positive for apnea (Uses CPAP at night), cough (Hemoptysis, resolved with antibiotics), shortness of breath (Sometimes at rest, always with  walking less than 100 yards) and wheezing. Negative for chest tightness.   Cardiovascular: Positive for leg swelling. Negative for chest pain and palpitations.       Claudication relieved with rest  Gastrointestinal: Negative for abdominal distention and abdominal pain.  Genitourinary: Negative for difficulty urinating and dysuria.  Musculoskeletal: Positive for arthralgias, gait problem and joint swelling.  Skin: Positive for rash.       Itching  Neurological: Positive for numbness (Feet). Negative for syncope and weakness.  Psychiatric/Behavioral: The patient is nervous/anxious.   All other systems reviewed and are negative.   BP (!) 145/80   Pulse 80   Resp 18   Ht 6\' 1"  (1.854 m)   Wt 260 lb (117.9 kg)   SpO2 97% Comment: RA  BMI 34.30 kg/m  Physical Exam  Constitutional: He is oriented to person, place, and time. No distress.  Obese  HENT:  Head: Normocephalic and atraumatic.  Mouth/Throat: No oropharyngeal exudate.  Eyes: Conjunctivae and EOM are normal. No scleral icterus.  Neck: No thyromegaly present.  Cardiovascular: Normal rate and regular rhythm. Exam reveals no gallop.  Murmur heard. No palpable DP or PT pulses  Pulmonary/Chest: Effort normal. He has no wheezes. He has no rales.  Diminished breath sounds bilaterally  Abdominal: Soft. He exhibits no distension. There is no tenderness.  Musculoskeletal: He exhibits edema (Trace bilateral).  Lymphadenopathy:    He has no cervical adenopathy.  Neurological: He is alert and oriented to person, place, and time. No cranial nerve deficit. He exhibits normal muscle tone.  Skin: Skin is warm and dry.  Vitals reviewed.    Diagnostic Tests: CT CHEST WITH CONTRAST  TECHNIQUE: Multidetector CT imaging of the chest was performed during intravenous contrast  administration.  CONTRAST:  58mL ISOVUE-300 IOPAMIDOL (ISOVUE-300) INJECTION 61%  COMPARISON:  Radiographs of November 10, 2017.  FINDINGS: Cardiovascular: 4.2 cm ascending thoracic aortic aneurysm is noted. Atherosclerosis of thoracic aorta is noted. Normal cardiac size. No pericardial effusion is noted. Coronary artery calcifications are noted suggesting coronary artery disease.  Mediastinum/Nodes: No enlarged mediastinal, hilar, or axillary lymph nodes. Thyroid gland, trachea, and esophagus demonstrate no significant findings.  Lungs/Pleura: No pneumothorax or pleural effusion is noted. 4.9 x 2.0 cm lobulated and slightly spiculated density is seen in the right lower lobe posterior to hilum concerning for malignancy.  Upper Abdomen: No acute abnormality.  Musculoskeletal: No chest wall abnormality. No acute or significant osseous findings.  IMPRESSION: 4.9 x 2.0 cm lobulated and slightly spiculated density seen in right lower lobe concerning for malignancy. PET scan is recommended for further evaluation. These results will be called to the ordering clinician or representative by the Radiologist Assistant, and communication documented in the PACS or zVision Dashboard.  4.2 cm ascending thoracic aortic aneurysm. Recommend annual imaging followup by CTA or MRA. This recommendation follows 2010 ACCF/AHA/AATS/ACR/ASA/SCA/SCAI/SIR/STS/SVM Guidelines for the Diagnosis and Management of Patients with Thoracic Aortic Disease. Circulation. 2010; 121: I967-E938.  Aortic Atherosclerosis (ICD10-I70.0).   Electronically Signed   By: Marijo Conception, M.D.   On: 11/12/2017 08:57 NUCLEAR MEDICINE PET SKULL BASE TO THIGH  TECHNIQUE: 12.8 mCi F-18 FDG was injected intravenously. Full-ring PET imaging was performed from the skull base to thigh after the radiotracer. CT data was obtained and used for attenuation correction and anatomic localization.  FASTING BLOOD  GLUCOSE:  Value: 139 mg/dl  COMPARISON:  CT 11/11/2017  FINDINGS: NECK  No hypermetabolic lymph nodes in the neck.  CHEST  Elongated lobular lesion within  the RIGHT lower lobe measures 4.5 by 2.8 cm in axial dimension and has associated metabolic activity with SUV max equal 5.4. Mass extends to the central RIGHT lower lobe bronchovascular structures just inferior to the hilum. Lesion has a branching nodular shape.  No additional pulmonary nodules.  There are no hypermetabolic mediastinal lymph nodes. No hypermetabolic supraclavicular lymph nodes.  ABDOMEN/PELVIS  No abnormal hypermetabolic activity within the liver, pancreas, adrenal glands, or spleen. No hypermetabolic lymph nodes in the abdomen or pelvis.  Small hypermetabolic focus within the subcutaneous superficial tissue of the LEFT lower abdomen just off midline (image 251, series 3) is favored benign skin lesion.  Prostate normal.  Atherosclerotic calcification of the aorta.  SKELETON  No focal hypermetabolic activity to suggest skeletal metastasis.  IMPRESSION:GR33N$BOR0: IMPRESSION:GR33N$BOR0  1. Elongated lobular and branching mass within the RIGHT lower lobe is hypermetabolic and concerning for bronchogenic carcinoma. 2. Mass approaches the central hilar structure but does not invade the hilum. 3. No hilar adenopathy, mediastinal adenopathy or distant metastatic disease. 4. Probable benign hypermetabolic focal cellulitis or subcutaneous inflammation in the LEFT lower abdomen just off midline. 5. FDG PET staging T2a N0 M0   Electronically Signed   By: Suzy Bouchard M.D.   On: 11/18/2017 12:59 I personally reviewed the CT and PET/CT images and concur with the findings noted above  Impression: Christian [name] is a 72 year old gentleman with a past medical history significant for hypertension, hyperlipidemia, coronary artery disease, and ascending aortic aneurysm, tobacco abuse,  obstructive sleep apnea, arthritis, and anxiety.  He recently presented with symptoms of respiratory infection.  This was likely bronchitis.  His cough is improved and his hemoptysis has resolved with antibiotics.  His workup revealed a right lower lobe mass measuring approximately 2 x 5 cm.  This is hypermetabolic on PET/CT.  It is highly suspicious for a new primary bronchogenic carcinoma.  Infectious or inflammatory etiologies are possible but far less likely.  I recommended to Mr. Arduini and his daughter that we proceed with bronchoscopy and endobronchial ultrasound for diagnostic and staging purposes.  We will see if we can get his recent CT converted to super D protocol for navigational bronchoscopy, but this may be central enough to see without needing to do that.  I described the general nature of the procedure.  It would be done in the operating room under general anesthesia.  It is endoscopic does not require incisions.  I informed him of the indications, risks, benefits, and alternatives.  They understand the risks include those associated with general anesthesia.  They understand the risk include but are not limited to death, MI, bleeding, pneumothorax, as well as possibility of other unforeseeable complications.  We also discussed potential treatment should this turn out for lung cancer.  Treatment of choice would be surgical resection.  It is unclear if he is a candidate for that.  At best he would be high risk.  We need to assess his pulmonary reserve so I will order pulmonary function testing.  Coronary artery disease-Per the chart he has known chronic total occlusions in his circumflex and right coronary.  He does have calcification in his LAD on CT.  He is not having any definite angina but does get short of breath with exertion which is relieved by rest.  He is diabetic and could be having silent ischemia.  He had a high risk nuclear study when hospitalized back in May 2017.  Catheterization  was recommended by the cardiologist here but he  did not follow-up with them.  He followed up with Dr. Earlie Counts in [facility].  I will asked Dr. Earlie Counts to see him for cardiology clearance for possible surgery.  Smoking cessation instruction/counseling given:  counseled patient on the dangers of tobacco use, advised patient to stop smoking, and reviewed strategies to maximize success  Ascending aneurysm-aorta is near top limit of diameter for man his size and age.  Needs annual follow-up  Plan: Pulmonary function testing Cardiology clearance to possible lung resection Bronchoscopy and endobronchial ultrasound on Monday, 12/15/2017.  I do not think he needs cardiology clearance prior to that procedure unless anesthesia feels it is necessary.  Melrose Nakayama, MD [facility] [phone]

## 2017-12-10 NOTE — H&P (View-Only) (Signed)
PCP is [name], MD Referring Provider is [name], MD  Chief Complaint  Patient presents with  . Lung Mass    Surgical eval, Chest CT 11/12/17, PET Scan 11/18/17    HPI: Mr. Christian [name] is a 72 year old gentleman with a history of tobacco abuse (1.5 pack/day x 53 years), coronary artery disease, type 2 diabetes mellitus with peripheral neuropathy, skin cancer, hypertension, hyperlipidemia, and obstructive sleep apnea.  Around Thanksgiving he became ill.  He developed a cough.  He thought he had the flu.  He did not improve and he went to see Dr. Damita Dunnings.  He was treated with antibiotics.  A chest x-ray showed a question of a right lower lobe mass.  A CT of the chest was done which revealed a 5 x 2 cm right lower lobe mass.  There is no mediastinal or hilar adenopathy.  A PET/CT showed the mass was hypermetabolic with an SUV of 5.4.  There was no evidence of regional or distant metastases.  His hemoptysis has resolved with antibiotics.  He gets short of breath with exertion.  He sometimes feels short of breath at rest as well.  He says he gets short of breath with walking less than 100 yards.  It is relieved by resting for 30 seconds to a couple of minutes.  He also gets pressure in his calves and thighs with walking but also relieved by rest.  He does have wheezing.  His appetite is good he is lost about 5 pounds over the past 3 months.  He does complain of decreased energy.  Reviewing his chart, he was admitted to [facility] back in May 2017.  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  He was advised to have cardiac catheterization but did not follow up with the [facility] cardiologists.  He is not currently having any chest pain, pressure, or tightness.  His activity is very limited.  He still smoking 1 pack of cigarettes daily.  Zubrod Score: At the time of surgery this patient's most appropriate activity status/level should be described as: []     0     Normal activity, no symptoms []     1    Restricted in physical strenuous activity but ambulatory, able to do out light work [x]     2    Ambulatory and capable of self care, unable to do work activities, up and about >50 % of waking hours                              []     3    Only limited self care, in bed greater than 50% of waking hours []     4    Completely disabled, no self care, confined to bed or chair []     5    Moribund  Past Medical History:  Diagnosis Date  . Anxiety   . Cancer ([place])    skin  . Diabetes mellitus type 2 with complications (HCC)    known CAD.   Marland Kitchen History of shingles    2004  . Hyperlipidemia   . Hypertension   . OSA on CPAP     Past Surgical History:  Procedure Laterality Date  . CARDIAC CATHETERIZATION     2010 @ [facility] (per pt)  . EYE SURGERY Bilateral    cataracts  . OPEN REDUCTION INTERNAL FIXATION (ORIF) DISTAL RADIAL FRACTURE  Right 11/26/2013   Procedure: OPEN REDUCTION INTERNAL FIXATION (ORIF) DISTAL RADIAL FRACTURE;  Surgeon: Newt Minion, MD;  Location: [place];  Service: Orthopedics;  Laterality: Right;  Open Reduction Internal Fixation Right Distal Radius     Family History  Problem Relation Age of Onset  . Heart disease Mother   . Heart disease Father   . Colon cancer Father        possible colon or prostate cancer, dx'd in his 23s, patient wasn't sure of source.   . Prostate cancer Father        possible colon or prostate cancer, dx'd in his 22s, patient wasn't sure of source.   Marland Kitchen Heart disease Brother   . Diabetes Brother   . Kidney disease Brother   . Heart disease Sister   . Cancer Sister     Social History Social History   Tobacco Use  . Smoking status: Current Every Day Smoker    Packs/day: 1.00    Years: 40.00    Pack years: 40.00    Types: Cigarettes  . Smokeless tobacco: Never Used  Substance Use Topics  . Alcohol use: Yes    Alcohol/week: 4.2 oz    Types: 7 Glasses of wine per week    Comment: 1  glass of wine per night  . Drug use: No  See HPI for more accurate smoking history  Current Outpatient Medications  Medication Sig Dispense Refill  . albuterol (PROVENTIL HFA;VENTOLIN HFA) 108 (90 BASE) MCG/ACT inhaler Inhale 2 puffs into the lungs every 6 (six) hours as needed for wheezing or shortness of breath. 1 Inhaler 3  . aspirin 325 MG tablet Take 325 mg by mouth daily.    . baclofen (LIORESAL) 20 MG tablet Take 1 tablet (20 mg total) by mouth 2 (two) times daily as needed for muscle spasms. 180 each 1  . clonazePAM (KLONOPIN) 0.5 MG tablet TAKE ONE TABLET BY MOUTH AT BEDTIME AS NEEDED FOR ANXIETY OR INSOMNIA 30 tablet 0  . doxycycline (VIBRA-TABS) 100 MG tablet Take 1 tablet (100 mg total) by mouth 2 (two) times daily. 20 tablet 0  . ketoconazole (NIZORAL) 2 % cream Apply 1 application topically 2 (two) times daily. 60 g 1  . lisinopril (PRINIVIL,ZESTRIL) 10 MG tablet Take 1 tablet (10 mg total) by mouth daily. 90 tablet 3  . Melaton-Thean-Cham-PassF-LBalm (MELATONIN + L-THEANINE PO) Take 1 tablet by mouth at bedtime as needed (sleep).     . metoprolol succinate (TOPROL-XL) 50 MG 24 hr tablet Take 1 tablet (50 mg total) by mouth daily. Take with or immediately following a meal. 90 tablet 3  . nitroGLYCERIN (NITROSTAT) 0.4 MG SL tablet Place 1 tablet (0.4 mg total) under the tongue every 5 (five) minutes as needed for chest pain. 25 tablet 1  . simvastatin (ZOCOR) 40 MG tablet Take 1 tablet (40 mg total) by mouth at bedtime. 90 tablet 3  . vitamin E 100 UNIT capsule Take 100 Units by mouth daily.     No current facility-administered medications for this visit.     Allergies  Allergen Reactions  . Celebrex [Celecoxib] Itching    Review of Systems  Constitutional: Positive for activity change, fever and unexpected weight change. Negative for chills.  HENT: Positive for dental problem and trouble swallowing. Negative for voice change.   Eyes: Negative for visual disturbance.   Respiratory: Positive for apnea (Uses CPAP at night), cough (Hemoptysis, resolved with antibiotics), shortness of breath (Sometimes at rest, always with  walking less than 100 yards) and wheezing. Negative for chest tightness.   Cardiovascular: Positive for leg swelling. Negative for chest pain and palpitations.       Claudication relieved with rest  Gastrointestinal: Negative for abdominal distention and abdominal pain.  Genitourinary: Negative for difficulty urinating and dysuria.  Musculoskeletal: Positive for arthralgias, gait problem and joint swelling.  Skin: Positive for rash.       Itching  Neurological: Positive for numbness (Feet). Negative for syncope and weakness.  Psychiatric/Behavioral: The patient is nervous/anxious.   All other systems reviewed and are negative.   BP (!) 145/80   Pulse 80   Resp 18   Ht 6\' 1"  (1.854 m)   Wt 260 lb (117.9 kg)   SpO2 97% Comment: RA  BMI 34.30 kg/m  Physical Exam  Constitutional: He is oriented to person, place, and time. No distress.  Obese  HENT:  Head: Normocephalic and atraumatic.  Mouth/Throat: No oropharyngeal exudate.  Eyes: Conjunctivae and EOM are normal. No scleral icterus.  Neck: No thyromegaly present.  Cardiovascular: Normal rate and regular rhythm. Exam reveals no gallop.  Murmur heard. No palpable DP or PT pulses  Pulmonary/Chest: Effort normal. He has no wheezes. He has no rales.  Diminished breath sounds bilaterally  Abdominal: Soft. He exhibits no distension. There is no tenderness.  Musculoskeletal: He exhibits edema (Trace bilateral).  Lymphadenopathy:    He has no cervical adenopathy.  Neurological: He is alert and oriented to person, place, and time. No cranial nerve deficit. He exhibits normal muscle tone.  Skin: Skin is warm and dry.  Vitals reviewed.    Diagnostic Tests: CT CHEST WITH CONTRAST  TECHNIQUE: Multidetector CT imaging of the chest was performed during intravenous contrast  administration.  CONTRAST:  75mL ISOVUE-300 IOPAMIDOL (ISOVUE-300) INJECTION 61%  COMPARISON:  Radiographs of November 10, 2017.  FINDINGS: Cardiovascular: 4.2 cm ascending thoracic aortic aneurysm is noted. Atherosclerosis of thoracic aorta is noted. Normal cardiac size. No pericardial effusion is noted. Coronary artery calcifications are noted suggesting coronary artery disease.  Mediastinum/Nodes: No enlarged mediastinal, hilar, or axillary lymph nodes. Thyroid gland, trachea, and esophagus demonstrate no significant findings.  Lungs/Pleura: No pneumothorax or pleural effusion is noted. 4.9 x 2.0 cm lobulated and slightly spiculated density is seen in the right lower lobe posterior to hilum concerning for malignancy.  Upper Abdomen: No acute abnormality.  Musculoskeletal: No chest wall abnormality. No acute or significant osseous findings.  IMPRESSION: 4.9 x 2.0 cm lobulated and slightly spiculated density seen in right lower lobe concerning for malignancy. PET scan is recommended for further evaluation. These results will be called to the ordering clinician or representative by the Radiologist Assistant, and communication documented in the PACS or zVision Dashboard.  4.2 cm ascending thoracic aortic aneurysm. Recommend annual imaging followup by CTA or MRA. This recommendation follows 2010 ACCF/AHA/AATS/ACR/ASA/SCA/SCAI/SIR/STS/SVM Guidelines for the Diagnosis and Management of Patients with Thoracic Aortic Disease. Circulation. 2010; 121: D638-V564.  Aortic Atherosclerosis (ICD10-I70.0).   Electronically Signed   By: Marijo Conception, M.D.   On: 11/12/2017 08:57 NUCLEAR MEDICINE PET SKULL BASE TO THIGH  TECHNIQUE: 12.8 mCi F-18 FDG was injected intravenously. Full-ring PET imaging was performed from the skull base to thigh after the radiotracer. CT data was obtained and used for attenuation correction and anatomic localization.  FASTING BLOOD  GLUCOSE:  Value: 139 mg/dl  COMPARISON:  CT 11/11/2017  FINDINGS: NECK  No hypermetabolic lymph nodes in the neck.  CHEST  Elongated lobular lesion within  the RIGHT lower lobe measures 4.5 by 2.8 cm in axial dimension and has associated metabolic activity with SUV max equal 5.4. Mass extends to the central RIGHT lower lobe bronchovascular structures just inferior to the hilum. Lesion has a branching nodular shape.  No additional pulmonary nodules.  There are no hypermetabolic mediastinal lymph nodes. No hypermetabolic supraclavicular lymph nodes.  ABDOMEN/PELVIS  No abnormal hypermetabolic activity within the liver, pancreas, adrenal glands, or spleen. No hypermetabolic lymph nodes in the abdomen or pelvis.  Small hypermetabolic focus within the subcutaneous superficial tissue of the LEFT lower abdomen just off midline (image 251, series 3) is favored benign skin lesion.  Prostate normal.  Atherosclerotic calcification of the aorta.  SKELETON  No focal hypermetabolic activity to suggest skeletal metastasis.  IMPRESSION:GR33N$BOR0: IMPRESSION:GR33N$BOR0  1. Elongated lobular and branching mass within the RIGHT lower lobe is hypermetabolic and concerning for bronchogenic carcinoma. 2. Mass approaches the central hilar structure but does not invade the hilum. 3. No hilar adenopathy, mediastinal adenopathy or distant metastatic disease. 4. Probable benign hypermetabolic focal cellulitis or subcutaneous inflammation in the LEFT lower abdomen just off midline. 5. FDG PET staging T2a N0 M0   Electronically Signed   By: Suzy Bouchard M.D.   On: 11/18/2017 12:59 I personally reviewed the CT and PET/CT images and concur with the findings noted above  Impression: Christian [name] is a 72 year old gentleman with a past medical history significant for hypertension, hyperlipidemia, coronary artery disease, and ascending aortic aneurysm, tobacco abuse,  obstructive sleep apnea, arthritis, and anxiety.  He recently presented with symptoms of respiratory infection.  This was likely bronchitis.  His cough is improved and his hemoptysis has resolved with antibiotics.  His workup revealed a right lower lobe mass measuring approximately 2 x 5 cm.  This is hypermetabolic on PET/CT.  It is highly suspicious for a new primary bronchogenic carcinoma.  Infectious or inflammatory etiologies are possible but far less likely.  I recommended to Mr. Beichner and his daughter that we proceed with bronchoscopy and endobronchial ultrasound for diagnostic and staging purposes.  We will see if we can get his recent CT converted to super D protocol for navigational bronchoscopy, but this may be central enough to see without needing to do that.  I described the general nature of the procedure.  It would be done in the operating room under general anesthesia.  It is endoscopic does not require incisions.  I informed him of the indications, risks, benefits, and alternatives.  They understand the risks include those associated with general anesthesia.  They understand the risk include but are not limited to death, MI, bleeding, pneumothorax, as well as possibility of other unforeseeable complications.  We also discussed potential treatment should this turn out for lung cancer.  Treatment of choice would be surgical resection.  It is unclear if he is a candidate for that.  At best he would be high risk.  We need to assess his pulmonary reserve so I will order pulmonary function testing.  Coronary artery disease-Per the chart he has known chronic total occlusions in his circumflex and right coronary.  He does have calcification in his LAD on CT.  He is not having any definite angina but does get short of breath with exertion which is relieved by rest.  He is diabetic and could be having silent ischemia.  He had a high risk nuclear study when hospitalized back in May 2017.  Catheterization  was recommended by the cardiologist here but he  did not follow-up with them.  He followed up with Dr. Earlie Counts in [facility].  I will asked Dr. Earlie Counts to see him for cardiology clearance for possible surgery.  Smoking cessation instruction/counseling given:  counseled patient on the dangers of tobacco use, advised patient to stop smoking, and reviewed strategies to maximize success  Ascending aneurysm-aorta is near top limit of diameter for man his size and age.  Needs annual follow-up  Plan: Pulmonary function testing Cardiology clearance to possible lung resection Bronchoscopy and endobronchial ultrasound on Monday, 12/15/2017.  I do not think he needs cardiology clearance prior to that procedure unless anesthesia feels it is necessary.  Melrose Nakayama, MD [facility] [phone]

## 2017-12-11 NOTE — Pre-Procedure Instructions (Signed)
Christian [name]  12/11/2017      Your procedure is scheduled on Monday, January 7.  Report to [facility] Admitting at 5:30 AM                   Your surgery or procedure is scheduled for  7:30 AM   Call this number if you have problems the morning of surgery: [phone]- pre- op desk     Remember:  Do not eat food or drink liquids after midnight Sunday, January 6.  Take these medicines the morning of surgery with A SIP OF WATER:  metoprolol succinate (TOPROL-XL).  Take if needed:  baclofen (LIORESAL) nitroGLYCERIN (NITROSTAT)   STOP taking Aspirin Products (Goody Powder, Excedrin Migraine), Ibuprofen (Advil), Naproxen (Aleve), Vitamins and Herbal Products (ie Fish Oil).          Stop Aspirin if directed to do so by Dr Roxan Hockey.            Special instructions:  [facility]- Preparing For Surgery  Before surgery, you can play an important role. Because skin is not sterile, your skin needs to be as free of germs as possible. You can reduce the number of germs on your skin by washing with CHG (chlorahexidine gluconate) Soap before surgery.  CHG is an antiseptic cleaner which kills germs and bonds with the skin to continue killing germs even after washing.  Please do not use if you have an allergy to CHG or antibacterial soaps. If your skin becomes reddened/irritated stop using the CHG.  Do not shave (including legs and underarms) for at least 48 hours prior to first CHG shower. It is OK to shave your face.  Please follow these instructions carefully.   1. Shower the NIGHT BEFORE SURGERY and the MORNING OF SURGERY with CHG.   2. If you chose to wash your hair, wash your hair first as usual with your normal shampoo.  3. After you shampoo, rinse your hair and body thoroughly to remove the shampoo.     Wash your face and private area with the soap you use at home, then rinse.  4. Use CHG as you would any other liquid soap. You can apply CHG directly to the skin and  wash gently with a scrungie or a clean washcloth.   5. Apply the CHG Soap to your body ONLY FROM THE NECK DOWN.  Do not use on open wounds or open sores. Avoid contact with your eyes, ears, mouth and genitals (private parts). Wash Face and genitals (private parts)  with your normal soap.  6. Wash thoroughly, paying special attention to the area where your surgery will be performed.  7. Thoroughly rinse your body with warm water from the neck down.  8. DO NOT shower/wash with your normal soap after using and rinsing off the CHG Soap.  9. Pat yourself dry with a CLEAN TOWEL.  10. Wear CLEAN PAJAMAS to bed the night before surgery, wear comfortable clothes the morning of surgery  11. Place CLEAN SHEETS on your bed the night of your first shower and DO NOT SLEEP WITH PETS.   Day of Surgery: Shower as above Do not apply any deodorants/lotions, powders or lotions.. Please wear clean clothes to the hospital/surgery center.    Do not wear jewelry, make-up or nail polish.  Do not shave 48 hours prior to surgery.  Men may shave face and neck.  Do not bring valuables to the hospital.  [facility]  is not responsible for any belongings or valuables.  Contacts, dentures or bridgework may not be worn into surgery.  Leave your suitcase in the car.  After surgery it may be brought to your room.  For patients admitted to the hospital, discharge time will be determined by your treatment team.  Patients discharged the day of surgery will not be allowed to drive home.   Please read over the following fact sheets that you were given: Pain Booklet, Coughing and Deep Breathind

## 2017-12-12 ENCOUNTER — Ambulatory Visit (HOSPITAL_COMMUNITY)
Admission: RE | Admit: 2017-12-12 | Discharge: 2017-12-12 | Disposition: A | Discharge status: Home / Self Care | Payer: PPO | Source: Ambulatory Visit | Attending: Thoracic Surgery (Cardiothoracic Vascular Surgery) | Admitting: Thoracic Surgery (Cardiothoracic Vascular Surgery)

## 2017-12-12 ENCOUNTER — Encounter (HOSPITAL_COMMUNITY): Payer: Self-pay

## 2017-12-12 ENCOUNTER — Encounter (HOSPITAL_COMMUNITY)
Admission: RE | Admit: 2017-12-12 | Discharge: 2017-12-12 | Disposition: A | Discharge status: Home / Self Care | Payer: PPO | Source: Ambulatory Visit | Attending: Thoracic Surgery (Cardiothoracic Vascular Surgery) | Admitting: Thoracic Surgery (Cardiothoracic Vascular Surgery)

## 2017-12-12 ENCOUNTER — Other Ambulatory Visit: Payer: Self-pay

## 2017-12-12 DIAGNOSIS — R918 Other nonspecific abnormal finding of lung field: Secondary | ICD-10-CM | POA: Diagnosis not present

## 2017-12-12 DIAGNOSIS — R9431 Abnormal electrocardiogram [ECG] [EKG]: Secondary | ICD-10-CM | POA: Diagnosis not present

## 2017-12-12 DIAGNOSIS — Z01818 Encounter for other preprocedural examination: Secondary | ICD-10-CM | POA: Insufficient documentation

## 2017-12-12 HISTORY — DX: Dyspnea, unspecified: R06.00

## 2017-12-12 HISTORY — DX: Unspecified osteoarthritis, unspecified site: M19.90

## 2017-12-12 HISTORY — DX: Gastro-esophageal reflux disease without esophagitis: K21.9

## 2017-12-12 HISTORY — DX: Pneumonia, unspecified organism: J18.9

## 2017-12-12 LAB — CBC
HCT: 43.9 % (ref 39.0–52.0)
Hemoglobin: 14.5 g/dL (ref 13.0–17.0)
MCH: 34.3 pg — ABNORMAL HIGH (ref 26.0–34.0)
MCHC: 33 g/dL (ref 30.0–36.0)
MCV: 103.8 fL — ABNORMAL HIGH (ref 78.0–100.0)
Platelets: 165 10*3/uL (ref 150–400)
RBC: 4.23 MIL/uL (ref 4.22–5.81)
RDW: 14.2 % (ref 11.5–15.5)
WBC: 9.7 10*3/uL (ref 4.0–10.5)

## 2017-12-12 LAB — COMPREHENSIVE METABOLIC PANEL
ALT: 21 U/L (ref 17–63)
AST: 21 U/L (ref 15–41)
Albumin: 3.7 g/dL (ref 3.5–5.0)
Alkaline Phosphatase: 64 U/L (ref 38–126)
Anion gap: 6 (ref 5–15)
BUN: 16 mg/dL (ref 6–20)
CO2: 25 mmol/L (ref 22–32)
Calcium: 9.3 mg/dL (ref 8.9–10.3)
Chloride: 106 mmol/L (ref 101–111)
Creatinine, Ser: 1.11 mg/dL (ref 0.61–1.24)
GFR calc Af Amer: >60 mL/min (ref >60)
GFR calc non Af Amer: >60 mL/min (ref >60)
Glucose, Bld: 103 mg/dL — ABNORMAL HIGH (ref 65–99)
Potassium: 3.9 mmol/L (ref 3.5–5.1)
Sodium: 137 mmol/L (ref 135–145)
Total Bilirubin: 0.9 mg/dL (ref 0.3–1.2)
Total Protein: 6.8 g/dL (ref 6.5–8.1)

## 2017-12-12 LAB — GLUCOSE, CAPILLARY: Glucose-Capillary: 106 mg/dL — ABNORMAL HIGH (ref 65–99)

## 2017-12-12 LAB — PROTIME-INR
INR: 1.01
Prothrombin Time: 13.2 seconds (ref 11.4–15.2)

## 2017-12-12 LAB — APTT: aPTT: 37 seconds — ABNORMAL HIGH (ref 24–36)

## 2017-12-14 ENCOUNTER — Encounter (HOSPITAL_COMMUNITY): Payer: Self-pay | Admitting: Anesthesiology

## 2017-12-14 NOTE — Anesthesia Preprocedure Evaluation (Addendum)
Anesthesia Evaluation  Patient identified by MRN, date of birth, ID band Patient awake    Reviewed: Allergy & Precautions, NPO status , Patient's Chart, lab work & pertinent test results  Airway Mallampati: II  TM Distance: >3 FB Neck ROM: Full    Dental  (+) Edentulous Upper, Edentulous Lower   Pulmonary sleep apnea and Continuous Positive Airway Pressure Ventilation , COPD, Current Smoker,    - rhonchi + decreased breath sounds(-) wheezing  rales    Cardiovascular hypertension, + CAD and + Peripheral Vascular Disease   Rhythm:Regular Rate:Normal     Neuro/Psych Anxiety negative neurological ROS     GI/Hepatic GERD  ,  Endo/Other  diabetes, Type 2  Renal/GU      Musculoskeletal  (+) Arthritis ,   Abdominal (+) + obese,   Peds  Hematology   Anesthesia Other Findings   Reproductive/Obstetrics                           Lab Results  Component Value Date   WBC 9.7 12/12/2017   HGB 14.5 12/12/2017   HCT 43.9 12/12/2017   MCV 103.8 (H) 12/12/2017   PLT 165 12/12/2017   Lab Results  Component Value Date   CREATININE 1.11 12/12/2017   BUN 16 12/12/2017   NA 137 12/12/2017   K 3.9 12/12/2017   CL 106 12/12/2017   CO2 25 12/12/2017   Lab Results  Component Value Date   INR 1.01 12/12/2017   INR 1.03 11/25/2013   EKG: NSR  Anesthesia Physical Anesthesia Plan  ASA: III  Anesthesia Plan: General   Post-op Pain Management:    Induction: Intravenous  PONV Risk Score and Plan: 2 and Ondansetron and Midazolam  Airway Management Planned: Oral ETT  Additional Equipment: None  Intra-op Plan:   Post-operative Plan: Extubation in OR  Informed Consent: I have reviewed the patients History and Physical, chart, labs and discussed the procedure including the risks, benefits and alternatives for the proposed anesthesia with the patient or authorized representative who has indicated  his/her understanding and acceptance.   Dental advisory given  Plan Discussed with: CRNA  Anesthesia Plan Comments:       Anesthesia Quick Evaluation

## 2017-12-15 ENCOUNTER — Ambulatory Visit (HOSPITAL_COMMUNITY): Payer: PPO

## 2017-12-15 ENCOUNTER — Ambulatory Visit (HOSPITAL_COMMUNITY): Payer: PPO | Admitting: Anesthesiology

## 2017-12-15 ENCOUNTER — Ambulatory Visit (HOSPITAL_COMMUNITY)
Admission: RE | Admit: 2017-12-15 | Discharge: 2017-12-15 | Disposition: A | Discharge status: Home / Self Care | Payer: PPO | Source: Ambulatory Visit | Attending: Thoracic Surgery (Cardiothoracic Vascular Surgery) | Admitting: Thoracic Surgery (Cardiothoracic Vascular Surgery)

## 2017-12-15 ENCOUNTER — Encounter (HOSPITAL_COMMUNITY): Payer: Self-pay | Admitting: Certified Registered Nurse Anesthetist

## 2017-12-15 ENCOUNTER — Encounter (HOSPITAL_COMMUNITY)
Admission: RE | Disposition: A | Discharge status: Home / Self Care | Payer: Self-pay | Source: Ambulatory Visit | Attending: Thoracic Surgery (Cardiothoracic Vascular Surgery)

## 2017-12-15 DIAGNOSIS — G4733 Obstructive sleep apnea (adult) (pediatric): Secondary | ICD-10-CM | POA: Diagnosis not present

## 2017-12-15 DIAGNOSIS — R222 Localized swelling, mass and lump, trunk: Secondary | ICD-10-CM | POA: Diagnosis not present

## 2017-12-15 DIAGNOSIS — G4730 Sleep apnea, unspecified: Secondary | ICD-10-CM | POA: Insufficient documentation

## 2017-12-15 DIAGNOSIS — E119 Type 2 diabetes mellitus without complications: Secondary | ICD-10-CM | POA: Diagnosis not present

## 2017-12-15 DIAGNOSIS — Z888 Allergy status to other drugs, medicaments and biological substances status: Secondary | ICD-10-CM | POA: Diagnosis not present

## 2017-12-15 DIAGNOSIS — E669 Obesity, unspecified: Secondary | ICD-10-CM | POA: Diagnosis not present

## 2017-12-15 DIAGNOSIS — I2510 Atherosclerotic heart disease of native coronary artery without angina pectoris: Secondary | ICD-10-CM | POA: Diagnosis not present

## 2017-12-15 DIAGNOSIS — C3431 Malignant neoplasm of lower lobe, right bronchus or lung: Secondary | ICD-10-CM | POA: Insufficient documentation

## 2017-12-15 DIAGNOSIS — Z7982 Long term (current) use of aspirin: Secondary | ICD-10-CM | POA: Insufficient documentation

## 2017-12-15 DIAGNOSIS — F17210 Nicotine dependence, cigarettes, uncomplicated: Secondary | ICD-10-CM | POA: Insufficient documentation

## 2017-12-15 DIAGNOSIS — Z9989 Dependence on other enabling machines and devices: Secondary | ICD-10-CM | POA: Diagnosis not present

## 2017-12-15 DIAGNOSIS — K219 Gastro-esophageal reflux disease without esophagitis: Secondary | ICD-10-CM | POA: Diagnosis not present

## 2017-12-15 DIAGNOSIS — R918 Other nonspecific abnormal finding of lung field: Secondary | ICD-10-CM

## 2017-12-15 DIAGNOSIS — J449 Chronic obstructive pulmonary disease, unspecified: Secondary | ICD-10-CM | POA: Diagnosis not present

## 2017-12-15 DIAGNOSIS — M1990 Unspecified osteoarthritis, unspecified site: Secondary | ICD-10-CM | POA: Insufficient documentation

## 2017-12-15 DIAGNOSIS — I10 Essential (primary) hypertension: Secondary | ICD-10-CM | POA: Diagnosis not present

## 2017-12-15 DIAGNOSIS — Z85828 Personal history of other malignant neoplasm of skin: Secondary | ICD-10-CM | POA: Diagnosis not present

## 2017-12-15 DIAGNOSIS — E1121 Type 2 diabetes mellitus with diabetic nephropathy: Secondary | ICD-10-CM | POA: Diagnosis not present

## 2017-12-15 DIAGNOSIS — Z79899 Other long term (current) drug therapy: Secondary | ICD-10-CM | POA: Diagnosis not present

## 2017-12-15 DIAGNOSIS — R911 Solitary pulmonary nodule: Secondary | ICD-10-CM | POA: Diagnosis present

## 2017-12-15 DIAGNOSIS — F419 Anxiety disorder, unspecified: Secondary | ICD-10-CM | POA: Diagnosis not present

## 2017-12-15 DIAGNOSIS — I739 Peripheral vascular disease, unspecified: Secondary | ICD-10-CM | POA: Insufficient documentation

## 2017-12-15 DIAGNOSIS — Z419 Encounter for procedure for purposes other than remedying health state, unspecified: Secondary | ICD-10-CM

## 2017-12-15 HISTORY — PX: VIDEO BRONCHOSCOPY WITH ENDOBRONCHIAL ULTRASOUND: SHX6177

## 2017-12-15 HISTORY — PX: VIDEO BRONCHOSCOPY WITH ENDOBRONCHIAL NAVIGATION: SHX6175

## 2017-12-15 LAB — HEMOGLOBIN A1C
Hgb A1c MFr Bld: 6.1 % — ABNORMAL HIGH (ref 4.8–5.6)
Mean Plasma Glucose: 128.37 mg/dL

## 2017-12-15 SURGERY — BRONCHOSCOPY, WITH EBUS
Anesthesia: General | Site: Chest | Laterality: Right

## 2017-12-15 MED ORDER — ACETAMINOPHEN 650 MG RE SUPP: 650.0000 mg | RECTAL | Status: DC | PRN

## 2017-12-15 MED ORDER — DEXAMETHASONE SODIUM PHOSPHATE 10 MG/ML IJ SOLN
INTRAMUSCULAR | Status: AC
  Filled 2017-12-15: qty 1

## 2017-12-15 MED ORDER — LACTATED RINGERS IV SOLN: INTRAVENOUS | Status: DC

## 2017-12-15 MED ORDER — SODIUM CHLORIDE 0.9% FLUSH: 3.0000 mL | Freq: Two times a day (BID) | INTRAVENOUS | Status: DC

## 2017-12-15 MED ORDER — PROPOFOL 10 MG/ML IV BOLUS
INTRAVENOUS | Status: DC | PRN
  Administered 2017-12-15: 130 mg via INTRAVENOUS

## 2017-12-15 MED ORDER — ACETAMINOPHEN 325 MG PO TABS: 650.0000 mg | ORAL_TABLET | ORAL | Status: DC | PRN

## 2017-12-15 MED ORDER — SODIUM CHLORIDE 0.9% FLUSH: 3.0000 mL | INTRAVENOUS | Status: DC | PRN

## 2017-12-15 MED ORDER — ROCURONIUM BROMIDE 10 MG/ML (PF) SYRINGE
PREFILLED_SYRINGE | INTRAVENOUS | Status: DC | PRN
  Administered 2017-12-15: 20 mg via INTRAVENOUS
  Administered 2017-12-15: 60 mg via INTRAVENOUS
  Administered 2017-12-15: 20 mg via INTRAVENOUS

## 2017-12-15 MED ORDER — SUGAMMADEX SODIUM 200 MG/2ML IV SOLN
INTRAVENOUS | Status: DC | PRN
  Administered 2017-12-15: 239.6 mg via INTRAVENOUS

## 2017-12-15 MED ORDER — LIDOCAINE 2% (20 MG/ML) 5 ML SYRINGE
INTRAMUSCULAR | Status: AC
  Filled 2017-12-15: qty 5

## 2017-12-15 MED ORDER — HYDROMORPHONE HCL 1 MG/ML IJ SOLN: 0.2500 mg | INTRAMUSCULAR | Status: DC | PRN

## 2017-12-15 MED ORDER — FENTANYL CITRATE (PF) 250 MCG/5ML IJ SOLN
INTRAMUSCULAR | Status: AC
  Filled 2017-12-15: qty 5

## 2017-12-15 MED ORDER — PROMETHAZINE HCL 25 MG/ML IJ SOLN: 6.2500 mg | INTRAMUSCULAR | Status: DC | PRN

## 2017-12-15 MED ORDER — EPHEDRINE 5 MG/ML INJ
INTRAVENOUS | Status: AC
  Filled 2017-12-15: qty 10

## 2017-12-15 MED ORDER — SUCCINYLCHOLINE CHLORIDE 200 MG/10ML IV SOSY
PREFILLED_SYRINGE | INTRAVENOUS | Status: AC
  Filled 2017-12-15: qty 10

## 2017-12-15 MED ORDER — MIDAZOLAM HCL 2 MG/2ML IJ SOLN
INTRAMUSCULAR | Status: AC
  Filled 2017-12-15: qty 2

## 2017-12-15 MED ORDER — SODIUM CHLORIDE 0.9 % IV SOLN: 250.0000 mL | INTRAVENOUS | Status: DC | PRN

## 2017-12-15 MED ORDER — FENTANYL CITRATE (PF) 100 MCG/2ML IJ SOLN
INTRAMUSCULAR | Status: DC | PRN
  Administered 2017-12-15: 50 ug via INTRAVENOUS

## 2017-12-15 MED ORDER — ONDANSETRON HCL 4 MG/2ML IJ SOLN
INTRAMUSCULAR | Status: AC
  Filled 2017-12-15: qty 2

## 2017-12-15 MED ORDER — ALBUTEROL SULFATE HFA 108 (90 BASE) MCG/ACT IN AERS
INHALATION_SPRAY | RESPIRATORY_TRACT | Status: DC | PRN
  Administered 2017-12-15: 2 via RESPIRATORY_TRACT

## 2017-12-15 MED ORDER — PROPOFOL 10 MG/ML IV BOLUS
INTRAVENOUS | Status: AC
  Filled 2017-12-15: qty 40

## 2017-12-15 MED ORDER — ROCURONIUM BROMIDE 10 MG/ML (PF) SYRINGE
PREFILLED_SYRINGE | INTRAVENOUS | Status: AC
  Filled 2017-12-15: qty 5

## 2017-12-15 MED ORDER — EPINEPHRINE PF 1 MG/ML IJ SOLN
INTRAMUSCULAR | Status: AC
  Filled 2017-12-15: qty 1

## 2017-12-15 MED ORDER — GLYCOPYRROLATE 0.2 MG/ML IJ SOLN
INTRAMUSCULAR | Status: DC | PRN
  Administered 2017-12-15: 0.2 mg via INTRAVENOUS

## 2017-12-15 MED ORDER — SUGAMMADEX SODIUM 200 MG/2ML IV SOLN
INTRAVENOUS | Status: AC
  Filled 2017-12-15: qty 2

## 2017-12-15 MED ORDER — ONDANSETRON HCL 4 MG/2ML IJ SOLN
INTRAMUSCULAR | Status: DC | PRN
  Administered 2017-12-15: 4 mg via INTRAVENOUS

## 2017-12-15 MED ORDER — PHENYLEPHRINE 40 MCG/ML (10ML) SYRINGE FOR IV PUSH (FOR BLOOD PRESSURE SUPPORT)
PREFILLED_SYRINGE | INTRAVENOUS | Status: AC
  Filled 2017-12-15: qty 10

## 2017-12-15 MED ORDER — LACTATED RINGERS IV SOLN
INTRAVENOUS | Status: DC | PRN
  Administered 2017-12-15: 07:00:00 via INTRAVENOUS

## 2017-12-15 MED ORDER — LIDOCAINE 2% (20 MG/ML) 5 ML SYRINGE
INTRAMUSCULAR | Status: DC | PRN
  Administered 2017-12-15: 40 mg via INTRAVENOUS

## 2017-12-15 MED ORDER — MEPERIDINE HCL 25 MG/ML IJ SOLN: 6.2500 mg | INTRAMUSCULAR | Status: DC | PRN

## 2017-12-15 MED ORDER — PHENYLEPHRINE HCL 10 MG/ML IJ SOLN
INTRAVENOUS | Status: DC | PRN
  Administered 2017-12-15: 20 ug/min via INTRAVENOUS

## 2017-12-15 MED ORDER — MIDAZOLAM HCL 5 MG/5ML IJ SOLN
INTRAMUSCULAR | Status: DC | PRN
  Administered 2017-12-15: 2 mg via INTRAVENOUS

## 2017-12-15 MED ORDER — DEXAMETHASONE SODIUM PHOSPHATE 10 MG/ML IJ SOLN
INTRAMUSCULAR | Status: DC | PRN
  Administered 2017-12-15: 10 mg via INTRAVENOUS

## 2017-12-15 MED ORDER — EPINEPHRINE PF 1 MG/ML IJ SOLN
INTRAMUSCULAR | Status: DC | PRN
  Administered 2017-12-15: 1 mg via ENDOTRACHEOPULMONARY

## 2017-12-15 MED ORDER — 0.9 % SODIUM CHLORIDE (POUR BTL) OPTIME
TOPICAL | Status: DC | PRN
  Administered 2017-12-15: 1000 mL

## 2017-12-15 SURGICAL SUPPLY — 41 items
ADAPTER BRONCH F/PENTAX (ADAPTER) ×1 IMPLANT
ADPR BSCP EDG PNTX (ADAPTER) ×2
BRUSH CYTOL CELLEBRITY 1.5X140 (MISCELLANEOUS) IMPLANT
BRUSH SUPERTRAX NDL-TIP CYTO (INSTRUMENTS) ×1 IMPLANT
CANISTER SUCT 3000ML PPV (MISCELLANEOUS) ×3 IMPLANT
CONT SPEC 4OZ CLIKSEAL STRL BL (MISCELLANEOUS) ×4 IMPLANT
COVER BACK TABLE 60X90IN (DRAPES) ×3 IMPLANT
COVER DOME SNAP 22 D (MISCELLANEOUS) ×3 IMPLANT
FILTER STRAW FLUID ASPIR (MISCELLANEOUS) ×1 IMPLANT
FORCEPS BIOP RJ4 1.8 (CUTTING FORCEPS) ×1 IMPLANT
FORCEPS BIOP SUPERTRX PREMAR (INSTRUMENTS) ×1 IMPLANT
FORCEPS RADIAL JAW LRG 4 PULM (INSTRUMENTS) IMPLANT
GAUZE SPONGE 4X4 12PLY STRL (GAUZE/BANDAGES/DRESSINGS) IMPLANT
GLOVE SURG SIGNA 7.5 PF LTX (GLOVE) ×3 IMPLANT
GOWN STRL REUS W/ TWL XL LVL3 (GOWN DISPOSABLE) ×2 IMPLANT
GOWN STRL REUS W/TWL XL LVL3 (GOWN DISPOSABLE) ×3
KIT CLEAN ENDO COMPLIANCE (KITS) ×6 IMPLANT
KIT PROCEDURE EDGE 90 (KITS) ×1 IMPLANT
KIT ROOM TURNOVER OR (KITS) ×3 IMPLANT
MARKER SKIN DUAL TIP RULER LAB (MISCELLANEOUS) ×3 IMPLANT
NDL BLUNT 18X1 FOR OR ONLY (NEEDLE) IMPLANT
NDL ECHOTIP HI DEF 22GA (NEEDLE) ×2 IMPLANT
NDL SUPERTRX PREMARK BIOPSY (NEEDLE) IMPLANT
NEEDLE BLUNT 18X1 FOR OR ONLY (NEEDLE) IMPLANT
NEEDLE ECHOTIP HI DEF 22GA (NEEDLE) ×3 IMPLANT
NEEDLE SUPERTRX PREMARK BIOPSY (NEEDLE) ×3 IMPLANT
NS IRRIG 1000ML POUR BTL (IV SOLUTION) ×3 IMPLANT
OIL SILICONE PENTAX (PARTS (SERVICE/REPAIRS)) ×3 IMPLANT
PAD ARMBOARD 7.5X6 YLW CONV (MISCELLANEOUS) ×6 IMPLANT
RADIAL JAW LRG 4 PULMONARY (INSTRUMENTS) ×1
SYR 10ML LL (SYRINGE) ×1 IMPLANT
SYR 20CC LL (SYRINGE) ×3 IMPLANT
SYR 20ML ECCENTRIC (SYRINGE) ×3 IMPLANT
SYR 3ML LL SCALE MARK (SYRINGE) IMPLANT
SYR 5ML LL (SYRINGE) ×2 IMPLANT
SYR 5ML LUER SLIP (SYRINGE) ×2 IMPLANT
TOWEL GREEN STERILE (TOWEL DISPOSABLE) ×2 IMPLANT
TOWEL GREEN STERILE FF (TOWEL DISPOSABLE) ×3 IMPLANT
TRAP SPECIMEN MUCOUS 40CC (MISCELLANEOUS) ×3 IMPLANT
TUBE CONNECTING 20X1/4 (TUBING) ×4 IMPLANT
WATER STERILE IRR 1000ML POUR (IV SOLUTION) ×3 IMPLANT

## 2017-12-15 NOTE — Op Note (Signed)
NAME:  Christian [name], Christian [name] NO.:  [id number]  MEDICAL RECORD NO.:  [id number]  LOCATION:                                 FACILITY:  PHYSICIAN:  Revonda Standard. Roxan Hockey, M.D. DATE OF BIRTH:  DATE OF PROCEDURE:  12/15/2017 DATE OF DISCHARGE:                              OPERATIVE REPORT   PREOPERATIVE DIAGNOSIS:  Right lower lobe mass.  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  PROCEDURES:   1.Electromagnetic navigational bronchoscopy with needle aspirations, brushings, and endobronchial biopsies. 2.Endobronchial ultrasound with mediastinal lymph node aspirations.  SURGEON:  Revonda Standard. Roxan Hockey, MD.  ASSISTANT:  None.  ANESTHESIA:  General.  FINDINGS:  Node aspirations negative for tumor.  Brushings positive for malignant cells.  CLINICAL NOTE:  Christian [name] is a 72 year old gentleman with a history of tobacco abuse as well as multiple other medical problems.  He developed a cough and became ill around Thanksgiving.  He was treated with antibiotics, but a chest x-ray showed a questionable right lower lobe mass.  A CT showed a 5 x 2 cm right lower lobe mass with no mediastinal or hilar adenopathy.  On PET-CT, the mass was hypermetabolic with an SUV of 5.4.  He was advised to undergo navigational bronchoscopy and endobronchial ultrasound for diagnosis and staging.  The indications, risks, benefits, and alternatives were discussed in detail with the patient.  He understood and accepted the risks and agreed to proceed.  OPERATIVE NOTE:  Christian [name] was brought to the operating room on October 15, 2018.  He had induction of general anesthesia and was intubated. Planning for the navigational bronchoscopy was done prior to induction of anesthesia.  Flexible fiberoptic bronchoscopy was performed via the endotracheal tube.  There was extrinsic narrowing of the superior segmental bronchus of the right lower lobe.  Initially, no  endobronchial lesions were seen.  The bronchoscope was removed. The endobronchial ultrasound probe then was advanced.  Systematic inspection of the mediastinal and hilar nodes was carried out.  Nodes were identified in the 4R, 7, and 11R stations.  Aspirations were performed of each of these nodes both with and without suction.  With each aspiration, the needle was advanced into the node and 15 or more passes were made with the needle within the node while visualizing with ultrasound.  Three aspirations were performed of each node.  The specimens were placed onto slides as well as into cytologic preparation fluid for cell block.  There was minimal bleeding with the aspirations.  The endobronchial ultrasound probe was removed.  The lymph node aspirations returned with no tumor seen.  The bronchoscope was reinserted.  The locatable guide for navigation was placed.  The bronchoscope was directed to the right lower lobe bronchus and the superior segmental bronchus was cannulated.  The locatable guide was within 5 mm of the center of the lesion with good alignment.  Needle aspirations were performed.  There was significant bleeding with the needle aspirations.  Next, brushings were performed as well.  The sheaths for the locatable guide were removed and epinephrine was applied to help with hemostasis.  This was dilute epinephrine solution applied topically.  After suctioning and  removing clot, there actually was an endobronchial mass lesion visible within the superior segmental bronchus and direct biopsies were performed.  Multiple biopsies were taken. There was bleeding with the biopsies and additional dilute epinephrine was applied topically to help control bleeding.  All the biopsies were sent for permanent pathology.  The brushings returned showing non-small cell carcinoma.  A final inspection was made with the bronchoscope. There was no ongoing bleeding.  The bronchoscope was removed.   The patient was extubated in the operating room and taken to the postanesthetic care unit in good condition.     Revonda Standard Roxan Hockey, M.D.     SCH/MEDQ  D:  12/15/2017  T:  12/15/2017  Job:  [id number]

## 2017-12-15 NOTE — Brief Op Note (Signed)
12/15/2017  10:10 AM  PATIENT:  Christian [name]  72 y.o. male  PRE-OPERATIVE DIAGNOSIS:  RLL MASS  POST-OPERATIVE DIAGNOSIS:  RLL MASS- malignant cells present  PROCEDURE:  Procedure(s): VIDEO BRONCHOSCOPY WITH ENDOBRONCHIAL ULTRASOUND (N/A) VIDEO BRONCHOSCOPY WITH ENDOBRONCHIAL NAVIGATION (Right) With needle aspirations, brushings and endobronchial biopsies  SURGEON:  Surgeon(s) and Role:    * Melrose Nakayama, MD - Primary  PHYSICIAN ASSISTANT:   ASSISTANTS: none   ANESTHESIA:   general  EBL:  5 mL   BLOOD ADMINISTERED:none  DRAINS: none   LOCAL MEDICATIONS USED:  NONE  SPECIMEN:  Source of Specimen:  lymph node aspirations, RLL mass  DISPOSITION OF SPECIMEN:  PATHOLOGY  COUNTS:  NO endoscopic  TOURNIQUET:  * No tourniquets in log *  DICTATION: .Other Dictation: Dictation Number -  PLAN OF CARE: Discharge to home after PACU  PATIENT DISPOSITION:  PACU - hemodynamically stable.   Delay start of Pharmacological VTE agent (>24hrs) due to surgical blood loss or risk of bleeding: not applicable

## 2017-12-15 NOTE — Discharge Instructions (Signed)
Do not drive or engage in heavy physical activity for 24 hours.  You may resume normal activities tomorrow  You may cough up small amounts of blood over the next few days  You may use an over the counter cough medication if needed  You may use acetaminophen (Tylenol) if needed for discomfort  My office will contact you with a follow up appointment for next week to discuss the results  Call [phone] if you develop chest pain, shortness of breath, fever > 101F or cough up more than 2 tablespoons of blood

## 2017-12-15 NOTE — Transfer of Care (Signed)
Immediate Anesthesia Transfer of Care Note  Patient: Christian [name]  Procedure(s) Performed: VIDEO BRONCHOSCOPY WITH ENDOBRONCHIAL ULTRASOUND (N/A ) VIDEO BRONCHOSCOPY WITH ENDOBRONCHIAL NAVIGATION (Right Chest)  Patient Location: PACU  Anesthesia Type:General  Level of Consciousness: awake, alert  and patient cooperative  Airway & Oxygen Therapy: Patient Spontanous Breathing and Patient connected to face mask oxygen  Post-op Assessment: Report given to RN, Post -op Vital signs reviewed and stable and Patient moving all extremities  Post vital signs: Reviewed and stable  Last Vitals:  Vitals:   12/15/17 0600  BP: 139/61  Pulse: 65  Resp: 18  Temp: 36.8 C  SpO2: 96%    Last Pain:  Vitals:   12/15/17 0625  TempSrc:   PainSc: 0-No pain         Complications: No apparent anesthesia complications

## 2017-12-15 NOTE — Anesthesia Procedure Notes (Signed)
Procedure Name: Intubation Date/Time: 12/15/2017 7:44 AM Performed by: Lowella Dell, CRNA Pre-anesthesia Checklist: Patient identified, Emergency Drugs available, Suction available and Patient being monitored Patient Re-evaluated:Patient Re-evaluated prior to induction Oxygen Delivery Method: Circle System Utilized Preoxygenation: Pre-oxygenation with 100% oxygen Induction Type: IV induction Ventilation: Mask ventilation without difficulty and Oral airway inserted - appropriate to patient size Laryngoscope Size: Mac Grade View: Grade I Tube type: Oral Tube size: 9.0 mm Number of attempts: 1 Airway Equipment and Method: Stylet and Oral airway Placement Confirmation: ETT inserted through vocal cords under direct vision,  positive ETCO2 and breath sounds checked- equal and bilateral Secured at: 22 cm Tube secured with: Tape Dental Injury: Teeth and Oropharynx as per pre-operative assessment

## 2017-12-15 NOTE — Anesthesia Postprocedure Evaluation (Signed)
Anesthesia Post Note  Patient: Christian [name]  Procedure(s) Performed: VIDEO BRONCHOSCOPY WITH ENDOBRONCHIAL ULTRASOUND (N/A ) VIDEO BRONCHOSCOPY WITH ENDOBRONCHIAL NAVIGATION (Right Chest)     Patient location during evaluation: PACU Anesthesia Type: General Level of consciousness: awake and alert Pain management: pain level controlled Vital Signs Assessment: post-procedure vital signs reviewed and stable Respiratory status: spontaneous breathing, nonlabored ventilation, respiratory function stable and patient connected to nasal cannula oxygen Cardiovascular status: blood pressure returned to baseline and stable Postop Assessment: no apparent nausea or vomiting Anesthetic complications: no    Last Vitals:  Vitals:   12/15/17 1020 12/15/17 1030  BP: 111/70 112/65  Pulse: 84 82  Resp: (!) 29   Temp: (!) 36.4 C   SpO2: 92% 93%    Last Pain:  Vitals:   12/15/17 0625  TempSrc:   PainSc: 0-No pain                 Effie Berkshire

## 2017-12-15 NOTE — Interval H&P Note (Signed)
History and Physical Interval Note:  12/15/2017 7:25 AM  Sallee Provencal  has presented today for surgery, with the diagnosis of RLL MASS  The various methods of treatment have been discussed with the patient and family. After consideration of risks, benefits and other options for treatment, the patient has consented to  Procedure(s): [place] (N/A) as a surgical intervention .  The patient's history has been reviewed, patient examined, no change in status, stable for surgery.  I have reviewed the patient's chart and labs.  Questions were answered to the patient's satisfaction.     Melrose Nakayama

## 2017-12-16 ENCOUNTER — Encounter (HOSPITAL_COMMUNITY): Payer: Self-pay | Admitting: Thoracic Surgery (Cardiothoracic Vascular Surgery)

## 2017-12-16 DIAGNOSIS — I2510 Atherosclerotic heart disease of native coronary artery without angina pectoris: Secondary | ICD-10-CM | POA: Diagnosis not present

## 2017-12-16 DIAGNOSIS — I10 Essential (primary) hypertension: Secondary | ICD-10-CM | POA: Diagnosis not present

## 2017-12-16 DIAGNOSIS — E785 Hyperlipidemia, unspecified: Secondary | ICD-10-CM | POA: Diagnosis not present

## 2017-12-16 DIAGNOSIS — Z01810 Encounter for preprocedural cardiovascular examination: Secondary | ICD-10-CM | POA: Diagnosis not present

## 2017-12-18 ENCOUNTER — Other Ambulatory Visit: Payer: Self-pay | Admitting: *Deleted

## 2017-12-19 ENCOUNTER — Ambulatory Visit (HOSPITAL_COMMUNITY)
Admission: RE | Admit: 2017-12-19 | Discharge: 2017-12-19 | Disposition: A | Discharge status: Home / Self Care | Payer: PPO | Source: Ambulatory Visit | Attending: Thoracic Surgery (Cardiothoracic Vascular Surgery) | Admitting: Thoracic Surgery (Cardiothoracic Vascular Surgery)

## 2017-12-19 DIAGNOSIS — R918 Other nonspecific abnormal finding of lung field: Secondary | ICD-10-CM

## 2017-12-19 DIAGNOSIS — J449 Chronic obstructive pulmonary disease, unspecified: Secondary | ICD-10-CM | POA: Insufficient documentation

## 2017-12-19 LAB — PULMONARY FUNCTION TEST
DL/VA % pred: 88 %
DL/VA: 4.18 ml/min/mmHg/L
DLCO unc % pred: 69 %
DLCO unc: 24.35 ml/min/mmHg
FEF 25-75 Post: 1.3 L/sec
FEF 25-75 Pre: 2.62 L/sec
FEF2575-%Change-Post: -50 %
FEF2575-%Pred-Post: 48 %
FEF2575-%Pred-Pre: 97 %
FEV1-%Change-Post: -18 %
FEV1-%Pred-Post: 64 %
FEV1-%Pred-Pre: 79 %
FEV1-Post: 2.28 L
FEV1-Pre: 2.8 L
FEV1FVC-%Change-Post: -17 %
FEV1FVC-%Pred-Pre: 105 %
FEV6-%Change-Post: -2 %
FEV6-%Pred-Post: 76 %
FEV6-%Pred-Pre: 78 %
FEV6-Post: 3.47 L
FEV6-Pre: 3.55 L
FEV6FVC-%Pred-Post: 105 %
FEV6FVC-%Pred-Pre: 105 %
FVC-%Change-Post: -1 %
FVC-%Pred-Post: 74 %
FVC-%Pred-Pre: 75 %
FVC-Post: 3.55 L
FVC-Pre: 3.6 L
Post FEV1/FVC ratio: 64 %
Post FEV6/FVC ratio: 100 %
Pre FEV1/FVC ratio: 78 %
Pre FEV6/FVC Ratio: 100 %

## 2017-12-19 MED ORDER — ALBUTEROL SULFATE (2.5 MG/3ML) 0.083% IN NEBU
2.5000 mg | INHALATION_SOLUTION | Freq: Once | RESPIRATORY_TRACT | Status: AC
  Administered 2017-12-19: 2.5 mg via RESPIRATORY_TRACT

## 2017-12-22 ENCOUNTER — Encounter: Payer: PPO | Admitting: Thoracic Surgery (Cardiothoracic Vascular Surgery)

## 2017-12-24 ENCOUNTER — Encounter: Payer: Self-pay | Admitting: Thoracic Surgery (Cardiothoracic Vascular Surgery)

## 2017-12-24 ENCOUNTER — Ambulatory Visit: Payer: PPO | Admitting: Thoracic Surgery (Cardiothoracic Vascular Surgery)

## 2017-12-24 VITALS — BP 160/75 | HR 67 | Resp 20 | Ht 72.0 in | Wt 262.0 lb

## 2017-12-24 DIAGNOSIS — C3491 Malignant neoplasm of unspecified part of right bronchus or lung: Secondary | ICD-10-CM

## 2017-12-24 DIAGNOSIS — I2510 Atherosclerotic heart disease of native coronary artery without angina pectoris: Secondary | ICD-10-CM | POA: Diagnosis not present

## 2017-12-24 DIAGNOSIS — Z01810 Encounter for preprocedural cardiovascular examination: Secondary | ICD-10-CM | POA: Diagnosis not present

## 2017-12-24 NOTE — Progress Notes (Signed)
[name]       [place] [address]             [phone]     HPI: Christian [name] returns to discuss the results of his bronchoscopy and endobronchial ultrasound.  Is a 72 year old gentleman with a 75-pack-year history of tobacco abuse, COPD, coronary disease, type 2 diabetes comp gated by peripheral neuropathy, hypertension, hyperlipidemia, obstructive sleep apnea, and skin cancer.  He presented with a cough and hemoptysis.  He was found to have a 5 x 2 cm right lower lobe mass that was hypermetabolic on PET with an SUV of 5.4.  There was no evidence of regional or distant metastases.  I did bronchoscopy and endobronchial ultrasound last week.  Pathology showed adenocarcinoma.  The lymph node aspirations were negative.  He does have a history of coronary disease and had a high risk stress test to [facility] over a year ago.  He never had that followed up on.  He says that he saw Dr. Vincente Poli PA today and had a stress test.  I do not have the results of that yet.  Past Medical History:  Diagnosis Date  . Anxiety   . Arthritis   . Cancer ([place])    skin  . Diabetes mellitus type 2 with complications ([place])    not on medication was "taken off of list"  . Dyspnea    walks 100 yards then rest  . GERD (gastroesophageal reflux disease)    ocassional no meds  . History of shingles    2004  . Hyperlipidemia   . Hypertension   . OSA on CPAP   . Pneumonia     Current Outpatient Medications  Medication Sig Dispense Refill  . albuterol (PROVENTIL HFA;VENTOLIN HFA) 108 (90 BASE) MCG/ACT inhaler Inhale 2 puffs into the lungs every 6 (six) hours as needed for wheezing or shortness of breath. (Patient taking differently: Inhale 1 puff into the lungs every 6 (six) hours as needed for wheezing or shortness of breath. ) 1 Inhaler 3  . aspirin 325 MG tablet Take 325 mg by mouth daily.    . baclofen (LIORESAL) 20 MG tablet Take 1 tablet (20 mg total) by mouth 2 (two) times daily as  needed for muscle spasms. (Patient taking differently: Take 40 mg by mouth daily. ) 180 each 1  . clonazePAM (KLONOPIN) 0.5 MG tablet TAKE ONE TABLET BY MOUTH AT BEDTIME AS NEEDED FOR ANXIETY OR INSOMNIA (Patient taking differently: Take 0.5 mg by mouth at bedtime. ) 30 tablet 0  . doxycycline (VIBRA-TABS) 100 MG tablet Take 1 tablet (100 mg total) by mouth 2 (two) times daily. 20 tablet 0  . ketoconazole (NIZORAL) 2 % cream Apply 1 application topically 2 (two) times daily. (Patient taking differently: Apply 1 application topically 2 (two) times daily as needed for irritation. ) 60 g 1  . lisinopril (PRINIVIL,ZESTRIL) 10 MG tablet Take 1 tablet (10 mg total) by mouth daily. (Patient taking differently: Take 10 mg by mouth every other day. ) 90 tablet 3  . Melaton-Thean-Cham-PassF-LBalm (MELATONIN + L-THEANINE PO) Take 1 tablet by mouth at bedtime.     . metoprolol succinate (TOPROL-XL) 50 MG 24 hr tablet Take 1 tablet (50 mg total) by mouth daily. Take with or immediately following a meal. 90 tablet 3  . nitroGLYCERIN (NITROSTAT) 0.4 MG SL tablet Place 1 tablet (0.4 mg total) under the tongue every 5 (five) minutes as needed for chest pain.  25 tablet 1  . simvastatin (ZOCOR) 40 MG tablet Take 1 tablet (40 mg total) by mouth at bedtime. 90 tablet 3  . vitamin E 100 UNIT capsule Take 100 Units by mouth daily.     No current facility-administered medications for this visit.     Physical Exam BP (!) 160/75   Pulse 67   Resp 20   Ht 6' (1.829 m)   Wt 262 lb (118.8 kg)   SpO2 98% Comment: RA  BMI 35.49 kg/m  72 year old man in no acute distress Alert and oriented x3 with no focal deficits Lungs clear bilaterally Cardiac regular rate and rhythm normal S1 and S2  Diagnostic Tests: Right lower lobe biopsy FINAL DIAGNOSIS Diagnosis Lung, biopsy, Right Lower Lobe - ADENOCARCINOMA.  Pulmonary function testing  FVC 3.60 (75%) FEV1 pre-2.80 (80%) FEV1 post 2.28 (64%) FEV1/FVC ratio  78% DLCO 24.35 (69%)  Impression: Christian [name] is a 71 year old gentleman with a history of tobacco abuse who has an adenocarcinoma of the right lower lobe.  This is relatively central near the takeoff of the superior segmental bronchus.  Clinical/ radiographic staging is IIA (T2, N0, M0).  He has not yet had brain imaging.  We will schedule an MR to rule out brain metastases.  He has shortness of breath with relatively minimal activity.  He is not having chest pain or pressure, but I am suspicious that his exertional shortness of breath is cardiac in origin.  His pulmonary function testing is relatively good considering his smoking history.  He tells me that he saw cardiology PA today and had a stress test.  I do not have any results from that visit or test.  I discussed with Christian [name] potential modes of treatment.  Given the size of the lesion, he is likely going to need adjuvant chemotherapy one way or the other.  Local component of treatment could be either radiation or resection.  In all likelihood he would require a bilobectomy for complete surgical resection given the proximity to the takeoff of the superior segmental bronchus.  He does have adequate pulmonary function to tolerate that operation.  But again I am worried about his cardiac status.  I did describe the general nature of the procedure to him which would be a left VATS for left lower lobectomy or possible bilobectomy.  I informed him of the incisions to be used, the need for general anesthesia, the use of drains to postoperatively, the expected hospital stay, and the overall recovery.  I informed him of the indications, risks, benefits, and alternatives.  He understands the risk include, but are not limited to death, MI, DVT, PE, bleeding, possible need for transfusion, infection, prolonged air leak, cardiac arrhythmias, respiratory failure, as well as possibility of other unforeseeable complications.  Best case scenario he would be  extremely high risk for cardiac complications.  We do need to get the results of his cardiology evaluation before we can move forward with scheduling surgery.  Plan: MR brain to complete clinical staging  Return in 1 week to discuss results of cardiology evaluation and further discussion of options for treatment  Melrose Nakayama, MD [facility] [phone]

## 2017-12-25 ENCOUNTER — Other Ambulatory Visit: Payer: Self-pay | Admitting: *Deleted

## 2017-12-25 DIAGNOSIS — C7931 Secondary malignant neoplasm of brain: Secondary | ICD-10-CM

## 2017-12-25 DIAGNOSIS — C3490 Malignant neoplasm of unspecified part of unspecified bronchus or lung: Secondary | ICD-10-CM

## 2017-12-25 DIAGNOSIS — C3431 Malignant neoplasm of lower lobe, right bronchus or lung: Secondary | ICD-10-CM

## 2017-12-31 ENCOUNTER — Telehealth: Payer: Self-pay

## 2017-12-31 NOTE — Telephone Encounter (Signed)
Copied from [place]. Topic: Quick Communication - See Telephone Encounter >> Dec 31, 2017  1:59 PM Oneta Rack wrote:  Relation to pt: self Call back number: [phone]   Reason for call:  Patient inquiring about stress test taken at [facility], patient states Dr. Damita Dunnings is the ordering provider, please advise >> Dec 31, 2017  2:01 PM Oneta Rack wrote:  Relation to pt: self Call back number: [phone]   Reason for call:  Patient inquiring about his stress test results taken at [facility], patient states Dr. Damita Dunnings is the ordering provider, please advise

## 2017-12-31 NOTE — Telephone Encounter (Signed)
I don't recall ordering the stress test and I don't see it in the EMR.  This would come though cardiology and he should contact them.  Thanks.

## 2017-12-31 NOTE — Telephone Encounter (Signed)
Notified patient that he should contact Dr. Rhetta Mura office at [facility] for results as Dr. Damita Dunnings did not set this up for him.  Patient verbalizes understanding and states that he actually just received a call from their office and everything is looking fine with the stress test and thanks [place] for our call.

## 2018-01-01 ENCOUNTER — Ambulatory Visit
Admission: RE | Admit: 2018-01-01 | Discharge: 2018-01-01 | Disposition: A | Discharge status: Home / Self Care | Payer: PPO | Source: Ambulatory Visit | Attending: Thoracic Surgery (Cardiothoracic Vascular Surgery) | Admitting: Thoracic Surgery (Cardiothoracic Vascular Surgery)

## 2018-01-01 DIAGNOSIS — C7931 Secondary malignant neoplasm of brain: Secondary | ICD-10-CM

## 2018-01-01 DIAGNOSIS — C3490 Malignant neoplasm of unspecified part of unspecified bronchus or lung: Secondary | ICD-10-CM

## 2018-01-01 DIAGNOSIS — C3431 Malignant neoplasm of lower lobe, right bronchus or lung: Secondary | ICD-10-CM

## 2018-01-01 MED ORDER — GADOBENATE DIMEGLUMINE 529 MG/ML IV SOLN
20.0000 mL | Freq: Once | INTRAVENOUS | Status: AC | PRN
  Administered 2018-01-01: 20 mL via INTRAVENOUS

## 2018-01-02 ENCOUNTER — Ambulatory Visit (INDEPENDENT_AMBULATORY_CARE_PROVIDER_SITE_OTHER): Payer: PPO | Admitting: Thoracic Surgery (Cardiothoracic Vascular Surgery)

## 2018-01-02 ENCOUNTER — Other Ambulatory Visit: Payer: Self-pay | Admitting: *Deleted

## 2018-01-02 VITALS — BP 140/70 | HR 78 | Resp 20 | Ht 72.0 in | Wt 265.0 lb

## 2018-01-02 DIAGNOSIS — C3491 Malignant neoplasm of unspecified part of right bronchus or lung: Secondary | ICD-10-CM | POA: Diagnosis not present

## 2018-01-02 DIAGNOSIS — C3431 Malignant neoplasm of lower lobe, right bronchus or lung: Secondary | ICD-10-CM

## 2018-01-02 NOTE — Progress Notes (Signed)
[address]       [place], [address]             [phone]     HPI: Mr. Briner returns to discuss possible surgery  Matuszak is a 72 year old man with a history of tobacco abuse, COPD, coronary artery disease, MI, type 2 diabetes complicated by peripheral neuropathy, obstructive sleep apnea, hypertension, and hyperlipidemia.  He developed a cough around Thanksgiving.  He thought he had the flu.  He was treated initially with antibiotics within a chest x-ray was done.  It showed possible right lower lobe mass.  A CT of the chest showed a 5 x 2 cm right lower lobe mass.  There was no hilar or mediastinal adenopathy.  On PET CT the mass was hypermetabolic with an SUV of 5.4.  There was no evidence of regional or distant metastases.  I did a bronchoscopy and endobronchial ultrasound on 12/15/2017.  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  His 4R and 7 nodes were negative for tumor.  He tolerated the procedure well without any complications.  He saw Dr. Earlie Counts for clearance.  Stress test was done.  He says he was told it is "okay."  He says he has cut his smoking down to half a pack of cigarettes daily  Zubrod Score: At the time of surgery this patient's most appropriate activity status/level should be described as: []     0    Normal activity, no symptoms []     1    Restricted in physical strenuous activity but ambulatory, able to do out light work [x]     2    Ambulatory and capable of self care, unable to do work activities, up and about >50 % of waking hours                              []     3    Only limited self care, in bed greater than 50% of waking hours []     4    Completely disabled, no self care, confined to bed or chair []     5    Moribund  Past Medical History:  Diagnosis Date  . Anxiety   . Arthritis   . Cancer ([place])    skin  . Diabetes mellitus type 2 with complications ([place])    not on medication was "taken off of list"  . Dyspnea    walks 100 yards then rest    . GERD (gastroesophageal reflux disease)    ocassional no meds  . History of shingles    2004  . Hyperlipidemia   . Hypertension   . OSA on CPAP   . Pneumonia    Past Surgical History:  Procedure Laterality Date  . CARDIAC CATHETERIZATION     2010 @ [facility] (per pt)  . COLONOSCOPY W/ POLYPECTOMY    . EYE SURGERY Bilateral    cataracts  . OPEN REDUCTION INTERNAL FIXATION (ORIF) DISTAL RADIAL FRACTURE Right 11/26/2013   Procedure: OPEN REDUCTION INTERNAL FIXATION (ORIF) DISTAL RADIAL FRACTURE;  Surgeon: Newt Minion, MD;  Location: [place];  Service: Orthopedics;  Laterality: Right;  Open Reduction Internal Fixation Right Distal Radius   . VIDEO BRONCHOSCOPY WITH ENDOBRONCHIAL NAVIGATION Right 12/15/2017   Procedure: VIDEO BRONCHOSCOPY WITH ENDOBRONCHIAL NAVIGATION;  Surgeon: Melrose Nakayama, MD;  Location: [place];  Service: Thoracic;  Laterality: Right;  Marland Kitchen VIDEO BRONCHOSCOPY  WITH ENDOBRONCHIAL ULTRASOUND N/A 12/15/2017   Procedure: VIDEO BRONCHOSCOPY WITH ENDOBRONCHIAL ULTRASOUND;  Surgeon: Melrose Nakayama, MD;  Location: [facility] OR;  Service: Thoracic;  Laterality: N/A;    Current Outpatient Medications  Medication Sig Dispense Refill  . albuterol (PROVENTIL HFA;VENTOLIN HFA) 108 (90 BASE) MCG/ACT inhaler Inhale 2 puffs into the lungs every 6 (six) hours as needed for wheezing or shortness of breath. (Patient taking differently: Inhale 1 puff into the lungs every 6 (six) hours as needed for wheezing or shortness of breath. ) 1 Inhaler 3  . aspirin 325 MG tablet Take 325 mg by mouth daily.    . baclofen (LIORESAL) 20 MG tablet Take 1 tablet (20 mg total) by mouth 2 (two) times daily as needed for muscle spasms. (Patient taking differently: Take 40 mg by mouth daily. ) 180 each 1  . clonazePAM (KLONOPIN) 0.5 MG tablet TAKE ONE TABLET BY MOUTH AT BEDTIME AS NEEDED FOR ANXIETY OR INSOMNIA (Patient taking differently: Take 0.5 mg by mouth at bedtime. ) 30 tablet 0  . doxycycline  (VIBRA-TABS) 100 MG tablet Take 1 tablet (100 mg total) by mouth 2 (two) times daily. 20 tablet 0  . ketoconazole (NIZORAL) 2 % cream Apply 1 application topically 2 (two) times daily. (Patient taking differently: Apply 1 application topically 2 (two) times daily as needed for irritation. ) 60 g 1  . lisinopril (PRINIVIL,ZESTRIL) 10 MG tablet Take 1 tablet (10 mg total) by mouth daily. (Patient taking differently: Take 10 mg by mouth every other day. ) 90 tablet 3  . Melaton-Thean-Cham-PassF-LBalm (MELATONIN + L-THEANINE PO) Take 1 tablet by mouth at bedtime.     . metoprolol succinate (TOPROL-XL) 50 MG 24 hr tablet Take 1 tablet (50 mg total) by mouth daily. Take with or immediately following a meal. 90 tablet 3  . nitroGLYCERIN (NITROSTAT) 0.4 MG SL tablet Place 1 tablet (0.4 mg total) under the tongue every 5 (five) minutes as needed for chest pain. 25 tablet 1  . simvastatin (ZOCOR) 40 MG tablet Take 1 tablet (40 mg total) by mouth at bedtime. 90 tablet 3  . vitamin E 100 UNIT capsule Take 100 Units by mouth daily.     No current facility-administered medications for this visit.     Physical Exam BP 140/70   Pulse 78   Resp 20   Ht 6' (1.829 m)   Wt 265 lb (120.2 kg)   SpO2 97% Comment: RA  BMI 35.5 kg/m  72 year old man in no acute distress Alert and oriented x3, no focal weakness, positive resting tremor No cervical or supraclavicular adenopathy Cardiac regular rate and rhythm with a 2/6 systolic murmur Lungs diminished breath sounds bilaterally, no wheezing Abdomen soft nontender Extremities trace edema lower extremities Skin multiple ecchymoses  Diagnostic Tests: Myocardial perfusion stress from [facility] on 12/26/2017 Conclusions 1.  Good quality study 2.  Normal Lexiscan stress EKG 3.  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  Abnormal stress myocardial perfusion with evidence of peri-infarct ischemia in the inferior  and posterior lateral walls 5.  Moderately reduced left ventricular systolic function, ejection fraction 37%. Global hypokinesis.  Result notes Abnormality correlates with anatomy.  He has CTO of circumflex and RCA, he may proceed with lung surgery with Dr. Roxan Hockey  I again reviewed the results of his CT, PET/CT, and pulmonary function tests CT shows a 2 x 5 cm spiculated mass in the left lower lobe.  Hypermetabolic on PET.  FVC 3.60 (75%) FEV1 2.80 (79%) FEV1 2.28 (64%) postbronchodilator DLCO 24.35 (69%)  MR brain showed no signs of metastatic disease.  There was a chronic small vessel disease  Impression: Mr. Fread is a 72 year old gentleman with multiple medical problems including heavy tobacco abuse, COPD, coronary artery disease, previous MI, obstructive sleep apnea and type 2 diabetes complicated by peripheral neuropathy.  He has a 2 x 5 cm mass in the right lower lobe.  This is biopsy-proven adenocarcinoma.  I again discussed with him therapeutic options including surgery, likely in combination with adjuvant chemotherapy versus radiation with or without chemotherapy.  He understands that surgery would require resection of his lower lobe and possibly a bilobectomy given the proximity to the takeoff of the middle lobe and lower lobe bronchi he understands will have a serious impact on his respiratory function he understands that surgery does provide a better chance of a cure, but it is by no means guaranteed.  I discussed the proposed procedure of right VATS, right lower lobectomy, possible bilobectomy in detail with Mr. Beevers.  He understands the general nature of the procedure, the need for general anesthesia, the incisions to be used, use of drainage tubes postoperatively, the expected hospital stay, and the overall recovery.  I informed him of the indications, risks, benefits, and alternatives.  He understands the risks include, but not limited to death, MI, DVT, PE, bleeding,  possible need for transfusion, infection, prolonged air leak, cardiac arrhythmias, as well as possibility of other unforeseeable complications.  He accepts the risks and wishes to proceed.  Plan: Right VATS, right lower lobectomy, possible bilobectomy on Friday, 01/16/2018  Melrose Nakayama, MD [facility] [phone]

## 2018-01-02 NOTE — H&P (View-Only) (Signed)
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  He developed a cough around Thanksgiving.  He thought he had the flu.  He was treated initially with antibiotics within a chest x-ray was done.  It showed possible right lower lobe mass.  A CT of the chest showed a 5 x 2 cm right lower lobe mass.  There was no hilar or mediastinal adenopathy.  On PET CT the mass was hypermetabolic with an SUV of 5.4.  There was no evidence of regional or distant metastases.  I did a bronchoscopy and endobronchial ultrasound on 12/15/2017.  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  His 4R and 7 nodes were negative for tumor.  He tolerated the procedure well without any complications.  He saw Dr. Earlie [name] for clearance.  Stress test was done.  He says he was told it is "okay."  He says he has cut his smoking down to half a pack of cigarettes daily  Zubrod Score: At the time of surgery this patient's most appropriate activity status/level should be described as: []     0    Normal activity, no symptoms []     1    Restricted in physical strenuous activity but ambulatory, able to do out light work [x]     2    Ambulatory and capable of self care, unable to do work activities, up and about >50 % of waking hours                              []     3    Only limited self care, in bed greater than 50% of waking hours []     4    Completely disabled, no self care, confined to bed or chair []     5    Moribund  Past Medical History:  Diagnosis Date  . Anxiety   . Arthritis   . Cancer ([place])    skin  . Diabetes mellitus type 2 with complications ([place])    not on medication was "taken off of list"  . Dyspnea    walks 100 yards then rest    . GERD (gastroesophageal reflux disease)    ocassional no meds  . History of shingles    2004  . Hyperlipidemia   . Hypertension   . OSA on CPAP   . Pneumonia    Past Surgical History:  Procedure Laterality Date  . CARDIAC CATHETERIZATION     2010 @ [facility] (per pt)  . COLONOSCOPY W/ POLYPECTOMY    . EYE SURGERY Bilateral    cataracts  . OPEN REDUCTION INTERNAL FIXATION (ORIF) DISTAL RADIAL FRACTURE Right 11/26/2013   Procedure: OPEN REDUCTION INTERNAL FIXATION (ORIF) DISTAL RADIAL FRACTURE;  Surgeon: Newt Minion, MD;  Location: [place];  Service: Orthopedics;  Laterality: Right;  Open Reduction Internal Fixation Right Distal Radius   . VIDEO BRONCHOSCOPY WITH ENDOBRONCHIAL NAVIGATION Right 12/15/2017   Procedure: VIDEO BRONCHOSCOPY WITH ENDOBRONCHIAL NAVIGATION;  Surgeon: Melrose Nakayama, MD;  Location: [place];  Service: Thoracic;  Laterality: Right;  Marland Kitchen VIDEO BRONCHOSCOPY  WITH ENDOBRONCHIAL ULTRASOUND N/A 12/15/2017   Procedure: VIDEO BRONCHOSCOPY WITH ENDOBRONCHIAL ULTRASOUND;  Surgeon: Melrose Nakayama, MD;  Location: [facility] OR;  Service: Thoracic;  Laterality: N/A;    Current Outpatient Medications  Medication Sig Dispense Refill  . albuterol (PROVENTIL HFA;VENTOLIN HFA) 108 (90 BASE) MCG/ACT inhaler Inhale 2 puffs into the lungs every 6 (six) hours as needed for wheezing or shortness of breath. (Patient taking differently: Inhale 1 puff into the lungs every 6 (six) hours as needed for wheezing or shortness of breath. ) 1 Inhaler 3  . aspirin 325 MG tablet Take 325 mg by mouth daily.    . baclofen (LIORESAL) 20 MG tablet Take 1 tablet (20 mg total) by mouth 2 (two) times daily as needed for muscle spasms. (Patient taking differently: Take 40 mg by mouth daily. ) 180 each 1  . clonazePAM (KLONOPIN) 0.5 MG tablet TAKE ONE TABLET BY MOUTH AT BEDTIME AS NEEDED FOR ANXIETY OR INSOMNIA (Patient taking differently: Take 0.5 mg by mouth at bedtime. ) 30 tablet 0  . doxycycline  (VIBRA-TABS) 100 MG tablet Take 1 tablet (100 mg total) by mouth 2 (two) times daily. 20 tablet 0  . ketoconazole (NIZORAL) 2 % cream Apply 1 application topically 2 (two) times daily. (Patient taking differently: Apply 1 application topically 2 (two) times daily as needed for irritation. ) 60 g 1  . lisinopril (PRINIVIL,ZESTRIL) 10 MG tablet Take 1 tablet (10 mg total) by mouth daily. (Patient taking differently: Take 10 mg by mouth every other day. ) 90 tablet 3  . Melaton-Thean-Cham-PassF-LBalm (MELATONIN + L-THEANINE PO) Take 1 tablet by mouth at bedtime.     . metoprolol succinate (TOPROL-XL) 50 MG 24 hr tablet Take 1 tablet (50 mg total) by mouth daily. Take with or immediately following a meal. 90 tablet 3  . nitroGLYCERIN (NITROSTAT) 0.4 MG SL tablet Place 1 tablet (0.4 mg total) under the tongue every 5 (five) minutes as needed for chest pain. 25 tablet 1  . simvastatin (ZOCOR) 40 MG tablet Take 1 tablet (40 mg total) by mouth at bedtime. 90 tablet 3  . vitamin E 100 UNIT capsule Take 100 Units by mouth daily.     No current facility-administered medications for this visit.     Physical Exam BP 140/70   Pulse 78   Resp 20   Ht 6' (1.829 m)   Wt 265 lb (120.2 kg)   SpO2 97% Comment: RA  BMI 35.47 kg/m  72 year old man in no acute distress Alert and oriented x3, no focal weakness, positive resting tremor No cervical or supraclavicular adenopathy Cardiac regular rate and rhythm with a 2/6 systolic murmur Lungs diminished breath sounds bilaterally, no wheezing Abdomen soft nontender Extremities trace edema lower extremities Skin multiple ecchymoses  Diagnostic Tests: Myocardial perfusion stress from [facility] on 12/26/2017 Conclusions 1.  Good quality study 2.  Normal Lexiscan stress EKG 3.  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  Abnormal stress myocardial perfusion with evidence of peri-infarct ischemia in the inferior  and posterior lateral walls 5.  Moderately reduced left ventricular systolic function, ejection fraction 37%. Global hypokinesis.  Result notes Abnormality correlates with anatomy.  He has CTO of circumflex and RCA, he may proceed with lung surgery with Dr. Roxan Hockey  I again reviewed the results of his CT, PET/CT, and pulmonary function tests CT shows a 2 x 5 cm spiculated mass in the left lower lobe.  Hypermetabolic on PET.  FVC 3.60 (75%) FEV1 2.80 (79%) FEV1 2.28 (64%) postbronchodilator DLCO 24.35 (69%)  MR brain showed no signs of metastatic disease.  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  He has a 2 x 5 cm mass in the right lower lobe.  This is biopsy-proven adenocarcinoma.  I again discussed with him therapeutic options including surgery, likely in combination with adjuvant chemotherapy versus radiation with or without chemotherapy.  He understands that surgery would require resection of his lower lobe and possibly a bilobectomy given the proximity to the takeoff of the middle lobe and lower lobe bronchi he understands will have a serious impact on his respiratory function he understands that surgery does provide a better chance of a cure, but it is by no means guaranteed.  I discussed the proposed procedure of right VATS, right lower lobectomy, possible bilobectomy in detail with Christian [name].  He understands the general nature of the procedure, the need for general anesthesia, the incisions to be used, use of drainage tubes postoperatively, the expected hospital stay, and the overall recovery.  I informed him of the indications, risks, benefits, and alternatives.  He understands the risks include, but not limited to death, MI, DVT, PE, bleeding,  possible need for transfusion, infection, prolonged air leak, cardiac arrhythmias, as well as possibility of other unforeseeable complications.  He accepts the risks and wishes to proceed.  Plan: Right VATS, right lower lobectomy, possible bilobectomy on Friday, 01/16/2018  Melrose Nakayama, MD [facility] [phone]

## 2018-01-14 ENCOUNTER — Encounter (HOSPITAL_COMMUNITY): Payer: Self-pay

## 2018-01-14 ENCOUNTER — Other Ambulatory Visit: Payer: Self-pay

## 2018-01-14 ENCOUNTER — Encounter (HOSPITAL_COMMUNITY)
Admission: RE | Admit: 2018-01-14 | Discharge: 2018-01-14 | Disposition: A | Discharge status: Home / Self Care | Payer: PPO | Source: Ambulatory Visit | Attending: Thoracic Surgery (Cardiothoracic Vascular Surgery) | Admitting: Thoracic Surgery (Cardiothoracic Vascular Surgery)

## 2018-01-14 DIAGNOSIS — C342 Malignant neoplasm of middle lobe, bronchus or lung: Secondary | ICD-10-CM | POA: Diagnosis not present

## 2018-01-14 DIAGNOSIS — M1990 Unspecified osteoarthritis, unspecified site: Secondary | ICD-10-CM | POA: Diagnosis present

## 2018-01-14 DIAGNOSIS — Z6836 Body mass index (BMI) 36.0-36.9, adult: Secondary | ICD-10-CM | POA: Diagnosis not present

## 2018-01-14 DIAGNOSIS — R918 Other nonspecific abnormal finding of lung field: Secondary | ICD-10-CM | POA: Diagnosis not present

## 2018-01-14 DIAGNOSIS — J9811 Atelectasis: Secondary | ICD-10-CM | POA: Diagnosis not present

## 2018-01-14 DIAGNOSIS — C3431 Malignant neoplasm of lower lobe, right bronchus or lung: Secondary | ICD-10-CM

## 2018-01-14 DIAGNOSIS — K219 Gastro-esophageal reflux disease without esophagitis: Secondary | ICD-10-CM | POA: Diagnosis present

## 2018-01-14 DIAGNOSIS — K5900 Constipation, unspecified: Secondary | ICD-10-CM | POA: Diagnosis not present

## 2018-01-14 DIAGNOSIS — G4733 Obstructive sleep apnea (adult) (pediatric): Secondary | ICD-10-CM | POA: Diagnosis present

## 2018-01-14 DIAGNOSIS — I10 Essential (primary) hypertension: Secondary | ICD-10-CM | POA: Diagnosis present

## 2018-01-14 DIAGNOSIS — J449 Chronic obstructive pulmonary disease, unspecified: Secondary | ICD-10-CM | POA: Diagnosis present

## 2018-01-14 DIAGNOSIS — J90 Pleural effusion, not elsewhere classified: Secondary | ICD-10-CM | POA: Diagnosis not present

## 2018-01-14 DIAGNOSIS — Z4682 Encounter for fitting and adjustment of non-vascular catheter: Secondary | ICD-10-CM | POA: Diagnosis not present

## 2018-01-14 DIAGNOSIS — D696 Thrombocytopenia, unspecified: Secondary | ICD-10-CM | POA: Diagnosis not present

## 2018-01-14 DIAGNOSIS — J939 Pneumothorax, unspecified: Secondary | ICD-10-CM | POA: Diagnosis not present

## 2018-01-14 DIAGNOSIS — J9382 Other air leak: Secondary | ICD-10-CM | POA: Diagnosis not present

## 2018-01-14 DIAGNOSIS — I252 Old myocardial infarction: Secondary | ICD-10-CM | POA: Diagnosis not present

## 2018-01-14 DIAGNOSIS — Z7982 Long term (current) use of aspirin: Secondary | ICD-10-CM | POA: Diagnosis not present

## 2018-01-14 DIAGNOSIS — D649 Anemia, unspecified: Secondary | ICD-10-CM | POA: Diagnosis present

## 2018-01-14 DIAGNOSIS — F17210 Nicotine dependence, cigarettes, uncomplicated: Secondary | ICD-10-CM | POA: Diagnosis present

## 2018-01-14 DIAGNOSIS — G4730 Sleep apnea, unspecified: Secondary | ICD-10-CM | POA: Diagnosis present

## 2018-01-14 DIAGNOSIS — F419 Anxiety disorder, unspecified: Secondary | ICD-10-CM | POA: Diagnosis present

## 2018-01-14 DIAGNOSIS — I2510 Atherosclerotic heart disease of native coronary artery without angina pectoris: Secondary | ICD-10-CM | POA: Diagnosis present

## 2018-01-14 DIAGNOSIS — E1142 Type 2 diabetes mellitus with diabetic polyneuropathy: Secondary | ICD-10-CM | POA: Diagnosis present

## 2018-01-14 DIAGNOSIS — E669 Obesity, unspecified: Secondary | ICD-10-CM | POA: Diagnosis present

## 2018-01-14 DIAGNOSIS — E785 Hyperlipidemia, unspecified: Secondary | ICD-10-CM | POA: Diagnosis present

## 2018-01-14 DIAGNOSIS — E1151 Type 2 diabetes mellitus with diabetic peripheral angiopathy without gangrene: Secondary | ICD-10-CM | POA: Diagnosis not present

## 2018-01-14 DIAGNOSIS — Z85828 Personal history of other malignant neoplasm of skin: Secondary | ICD-10-CM | POA: Diagnosis not present

## 2018-01-14 HISTORY — DX: Atherosclerotic heart disease of native coronary artery without angina pectoris: I25.10

## 2018-01-14 LAB — CBC
HCT: 42.8 % (ref 39.0–52.0)
Hemoglobin: 14.2 g/dL (ref 13.0–17.0)
MCH: 34.7 pg — ABNORMAL HIGH (ref 26.0–34.0)
MCHC: 33.2 g/dL (ref 30.0–36.0)
MCV: 104.6 fL — ABNORMAL HIGH (ref 78.0–100.0)
Platelets: 162 10*3/uL (ref 150–400)
RBC: 4.09 MIL/uL — ABNORMAL LOW (ref 4.22–5.81)
RDW: 13.8 % (ref 11.5–15.5)
WBC: 8.2 10*3/uL (ref 4.0–10.5)

## 2018-01-14 LAB — COMPREHENSIVE METABOLIC PANEL
ALT: 20 U/L (ref 17–63)
AST: 22 U/L (ref 15–41)
Albumin: 3.7 g/dL (ref 3.5–5.0)
Alkaline Phosphatase: 63 U/L (ref 38–126)
Anion gap: 10 (ref 5–15)
BUN: 19 mg/dL (ref 6–20)
CO2: 19 mmol/L — ABNORMAL LOW (ref 22–32)
Calcium: 9.1 mg/dL (ref 8.9–10.3)
Chloride: 108 mmol/L (ref 101–111)
Creatinine, Ser: 1.18 mg/dL (ref 0.61–1.24)
GFR calc Af Amer: >60 mL/min (ref >60)
GFR calc non Af Amer: >60 mL/min (ref >60)
Glucose, Bld: 157 mg/dL — ABNORMAL HIGH (ref 65–99)
Potassium: 4.4 mmol/L (ref 3.5–5.1)
Sodium: 137 mmol/L (ref 135–145)
Total Bilirubin: 0.6 mg/dL (ref 0.3–1.2)
Total Protein: 6.5 g/dL (ref 6.5–8.1)

## 2018-01-14 LAB — PROTIME-INR
INR: 1.1
Prothrombin Time: 14.1 seconds (ref 11.4–15.2)

## 2018-01-14 LAB — BLOOD GAS, ARTERIAL
Acid-base deficit: 1.3 mmol/L (ref 0.0–2.0)
Bicarbonate: 22.2 mmol/L (ref 20.0–28.0)
Drawn by: 44984
FIO2: 21
O2 Saturation: 92.1 %
Patient temperature: 98.6
pCO2 arterial: 32.9 mmHg (ref 32.0–48.0)
pH, Arterial: 7.444 (ref 7.350–7.450)
pO2, Arterial: 59.4 mmHg — ABNORMAL LOW (ref 83.0–108.0)

## 2018-01-14 LAB — URINALYSIS, ROUTINE W REFLEX MICROSCOPIC
Bilirubin Urine: NEGATIVE
Glucose, UA: NEGATIVE mg/dL
Hgb urine dipstick: NEGATIVE
Ketones, ur: NEGATIVE mg/dL
Leukocytes, UA: NEGATIVE
Nitrite: NEGATIVE
Protein, ur: NEGATIVE mg/dL
Specific Gravity, Urine: 1.018 (ref 1.005–1.030)
pH: 6 (ref 5.0–8.0)

## 2018-01-14 LAB — SURGICAL PCR SCREEN
MRSA, PCR: NEGATIVE
Staphylococcus aureus: NEGATIVE

## 2018-01-14 LAB — APTT: aPTT: 36 seconds (ref 24–36)

## 2018-01-14 LAB — ABO/RH: ABO/RH(D): A POS

## 2018-01-14 NOTE — Progress Notes (Signed)
PCP - Dr. Elsie Stain Cardiologist - Dr. Wyline Copas - see Care Everywhere notes  EKG - 01/14/18 CXR - 01/14/18  Stress test - 12/24/17 - Care Everywhere Echo - 05/07/16 Cardiac Cath - 2010 at [facility]  Patient denies chest pain and new onset shortness of breath.  Patient states that he does get short of breath when walking long distances.  Patient states that he does not have diabetes and denies checking blood sugars at home.  Patient verbalizes day of surgery instructions.    Patient informed nurse that he started taking Aspirin himself and that he stopped taking on 01/13/18.

## 2018-01-14 NOTE — Pre-Procedure Instructions (Signed)
Christian [name]  01/14/2018      [id number] - [place], [place] [address] Phone: [phone] Fax: [phone]    Your procedure is scheduled on Friday, February 8th, 2019 .  [facility]:30 [facility].M.   Call this number if you have problems the morning of surgery:  [phone]   Remember:  Do not eat food or drink liquids after midnight.   Take these medicines the morning of surgery with A SIP OF WATER: Albuterol Inhaler if needed; Metoprolol Succinate (Toprol-XL).  Please follow your doctor's instructions for you Aspirin.     Stop taking: NSAIDS, Aleve, Naproxen, Ibuprofen, Advil, Motrin, BC's, Goody's, Fish Oil, all herbal medications, and all vitamins.     Do not wear jewelry.  Do not wear lotions, powders, or colognes, or deodorant.  Do not shave 48 hours prior to surgery.  Men may shave face and neck.  Do not bring valuables to the hospital.   [facility] is not responsible for any belongings or valuables.  Contacts, dentures or bridgework may not be worn into surgery.  Leave your suitcase in the car.  After surgery it may be brought to your room.  For patients admitted to the hospital, discharge time will be determined by your treatment team.  Patients discharged the day of surgery will not be allowed to drive home.   Special instructions:   [facility]- Preparing For Surgery  Before surgery, you can play an important role. Because skin is not sterile, your skin needs to be as free of germs as possible. You can reduce the number of germs on your skin by washing with CHG (chlorahexidine gluconate) Soap before surgery.  CHG is an antiseptic cleaner which kills germs and bonds with the skin to continue killing germs even after washing.  Please do not use if you have an allergy to CHG or antibacterial soaps. If your skin becomes reddened/irritated stop using the CHG.  Do not shave  (including legs and underarms) for at least 48 hours prior to first CHG shower. It is OK to shave your face.  Please follow these instructions carefully.   1. Shower the NIGHT BEFORE SURGERY and the MORNING OF SURGERY with CHG.   2. If you chose to wash your hair, wash your hair first as usual with your normal shampoo.  3. After you shampoo, rinse your hair and body thoroughly to remove the shampoo.  4. Use CHG as you would any other liquid soap. You can apply CHG directly to the skin and wash gently with a scrungie or a clean washcloth.   5. Apply the CHG Soap to your body ONLY FROM THE NECK DOWN.  Do not use on open wounds or open sores. Avoid contact with your eyes, ears, mouth and genitals (private parts). Wash Face and genitals (private parts)  with your normal soap.  6. Wash thoroughly, paying special attention to the area where your surgery will be performed.  7. Thoroughly rinse your body with warm water from the neck down.  8. DO NOT shower/wash with your normal soap after using and rinsing off the CHG Soap.  9. Pat yourself dry with a CLEAN TOWEL.  10. Wear CLEAN PAJAMAS to bed the night before surgery, wear comfortable clothes the morning of surgery  11. Place CLEAN SHEETS on your bed the night of your first shower and DO NOT SLEEP WITH PETS.  Day of Surgery: Do not apply any deodorants/lotions. Please wear clean clothes to the hospital/surgery center.      Please read over the following fact sheets that you were given. Pain Booklet, Coughing and Deep Breathing, Blood Transfusion Information, MRSA Information and Surgical Site Infection Prevention

## 2018-01-15 NOTE — Progress Notes (Addendum)
Anesthesia chart review: Patient is a 72 year old male scheduled for right VATS, right lower lobectomy, possible bilobectomy on 01/16/2018 by Dr. Modesto Charon. He is s/p video bronchoscopy 12/15/17 with RLL biopsy positive for adenocarcinoma.  History includes smoking, right lung cancer, hypertension, hyperlipidemia, anxiety, dyspnea, arthritis, OSA with CPAP, CAD ("CTO Cir and RCA" 2010), diabetes mellitus type 2, GERD, skin cancer, ORIF right radial fracture 11/26/13. He had a 4.2 cm ascending TAA on 11/11/17 CT.  - PCP is Dr. Elsie Stain. - [facility]  Last visit with Talbert Cage, NP 12/16/17. Preoperative stress test was ordered (see below). On 12/26/17, Talbert Cage, NP wrote, "normality correlates with anatomy.  He has CTO of Cir and RCA, he may proceed with lung surgery with Dr. Roxan Hockey." (scanned under Media tab)  Meds include albuterol, ASA 325 mg (last dose 01/13/18), baclofen, Klonopin, lisinopril, Toprol XL, Nitro, Zocor, Vitamin E.  BP (!) 136/54   Pulse 69   Temp 36.8 C   Resp 20   Ht 6' (1.829 m)   Wt 266 lb 14.4 oz (121.1 kg)   SpO2 95%   BMI 36.20 kg/m   EKG 01/14/2018: Sinus rhythm with first-degree AV block, LAD.  Nuclear stress test 12/24/17 ([place]): Summary  1. Good quality study.  2. Normal Lexiscan stress EKG.  3. Abnormal resting myocardial perfusion with evidence of old inferior  infarct.  4. Abnormal stress myocardial perfusion with evidence of peri-infarct  ischemia in the inferior and posterolateral walls.  5. Moderately reduced LV systolic function, EF 60%. Global hypokinesis. (Results felt consistent with his known coronary anatomy. He was cleared for lung surgery by Talbert Cage, NP with [facility]. He had a previous stress test 05/07/16 at [facility] during chest pain admission which showed reversible ischemia involving inferolateral and inferoapical walls and was  considered a high risk study. This was felt consistent with known coronary anatomy. However, there was some concern for worsening LAD disease which might not be readily apparent on the nuclear images which showed "relative" normal flow to the anterior wall. Cardiac cath was initially recommended, but ultimately his primary cardiologist opted for medical therapy as patient's symptoms resolved.)  Echo 05/07/16: Study Conclusions - Procedure narrative: Transthoracic echocardiography. Image   quality was adequate. The study was technically difficult due to   variations in respirations. - Left ventricle: The cavity size was normal. There was moderate   concentric hypertrophy. Systolic function was normal. The   estimated ejection fraction was in the range of 60% to 65%. Wall   motion was normal; there were no regional wall motion   abnormalities. Left ventricular diastolic function parameters   were normal. - Aortic valve: Trileaflet; mildly thickened, mildly calcified   leaflets.  Cardiac cath 04/24/09 [facility]): Summary: 1.  Minimal disease in mid LAD, 20%. 2.  Small diagonal 2, subtotal. 3.  CTO circumflex with distal collaterals. 4.  Small RCA, CTO with collaterals. 5.  Inferior basal wall hypokinesis, LVEF 45-50%. Recommendations: Medical treatment.  CT Chest 11/11/17: IMPRESSION: - 4.9 x 2.0 cm lobulated and slightly spiculated density seen in right lower lobe concerning for malignancy. PET scan is recommended for further evaluation. These results will be called to the ordering clinician or representative by the Radiologist Assistant, and communication documented in the PACS or zVision Dashboard. 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 Recommend annual imaging followup by CTA or MRA. This recommendation follows 2010 ACCF/AHA/AATS/ACR/ASA/SCA/SCAI/SIR/STS/SVM Guidelines for the  Diagnosis and Management of Patients with Thoracic Aortic Disease. Circulation. 2010; 121:  C944-H675. - Aortic Atherosclerosis (ICD10-I70.0).  CXR 01/14/18: IMPRESSION: 1. Ill-defined mass in the right lower lobe demonstrate represent non-small cell carcinoma. 2.  Aortic Atherosclerosis (ICD10-I70.0).  PFTs 12/19/17: FVC 3.60 (75%), FEV1 2.80 (79%), DLCO unc 24.35 (69%).  Preoperative labs noted.  Creatinine 1.18.  Glucose 157.  H&H 14.2 and 42.3.  Platelet count 162.  PT/PTT, UA within normal limits.  A1c 6.1.  Per PAT notes, patient denied chest pain or new SOB. Reported chronic DOE. Reviewed stress test and NP cardiac clearance with anesthesiologist Dr. Annye Asa. Stress test felt to be consistent with his known anatomy; however with upcoming major lung surgery, recommendation to clarify that Dr. Wyline Copas had also reviewed and is in agreement with no further testing prior to surgery. I have left a message for Dr. Earlie Counts, his nurse Marye Round, or Talbert Cage, NP to call me. Staff will have [place] call me. Message was left as urgent.  [facility] Short Stay Center/Anesthesiology Phone [phone] 01/15/2018 1:00 PM  Addendum: No response from cardiology office this afternoon, but I was finally given Dr. Rhetta Mura pager number [phone]) and was able to speak with him personally. I re-reviewed patient's surgery plans, January stress test results, and 2010 cardiac anatomy. He did confirm that Oneita Hurt, NP had reviewed the stress test results with him and that he had personally reviewed the images. Patient denied chest pain and new SOB. If no change in symptoms, Dr. Earlie Counts reiterated that he felt patient could under lung surgery without further cardiac testing. I have updated anesthesiologist Dr. Jenita Seashore. She has also discussed with Dr. Roxan Hockey.   [facility] Short Stay Center/Anesthesiology Phone [phone] 01/15/2018 5:17 PM

## 2018-01-15 NOTE — Anesthesia Preprocedure Evaluation (Addendum)
Anesthesia Evaluation  Patient identified by MRN, date of birth, ID band Patient awake    Reviewed: Allergy & Precautions, NPO status , Patient's Chart, lab work & pertinent test results, reviewed documented beta blocker date and time   History of Anesthesia Complications Negative for: history of anesthetic complications  Airway Mallampati: I  TM Distance: >3 FB Neck ROM: Full    Dental  (+) Edentulous Upper, Edentulous Lower   Pulmonary sleep apnea (no longer wears CPAP) , COPD,  COPD inhaler, Current Smoker,    breath sounds clear to auscultation       Cardiovascular hypertension, Pt. on home beta blockers and Pt. on medications (-) angina+ CAD, + Past MI and + Peripheral Vascular Disease   Rhythm:Regular Rate:Normal  1/19 stress:  Abnormal resting myocardial perfusion with evidence of old inferior infarct, peri-infarct ischemia in the inferior and posterolateral walls. Moderately reduced LV systolic function, EF 95%. Global hypokinesis. Discussed with cardiologist, who does not think pt's symptoms and stress warrant cath   Neuro/Psych Anxiety Movement disorder negative neurological ROS     GI/Hepatic Neg liver ROS, GERD  Controlled,  Endo/Other  diabetes (diet controlled, glu 137)  Renal/GU negative Renal ROS     Musculoskeletal   Abdominal (+) + obese,   Peds  Hematology   Anesthesia Other Findings   Reproductive/Obstetrics                            Anesthesia Physical Anesthesia Plan  ASA: IV  Anesthesia Plan: General   Post-op Pain Management:    Induction: Intravenous  PONV Risk Score and Plan: 2 and Ondansetron and Dexamethasone  Airway Management Planned: Oral ETT and Double Lumen EBT  Additional Equipment: Arterial line  Intra-op Plan:   Post-operative Plan: Possible Post-op intubation/ventilation  Informed Consent: I have reviewed the patients History and Physical,  chart, labs and discussed the procedure including the risks, benefits and alternatives for the proposed anesthesia with the patient or authorized representative who has indicated his/her understanding and acceptance.     Plan Discussed with: CRNA and Surgeon  Anesthesia Plan Comments: (Plan routine monitors, A line, GETA with possible post op ventilation)        Anesthesia Quick Evaluation

## 2018-01-16 ENCOUNTER — Inpatient Hospital Stay (HOSPITAL_COMMUNITY): Payer: PPO

## 2018-01-16 ENCOUNTER — Encounter (HOSPITAL_COMMUNITY)
Admission: RE | Disposition: A | Discharge status: Home / Self Care | Payer: Self-pay | Source: Ambulatory Visit | Attending: Thoracic Surgery (Cardiothoracic Vascular Surgery)

## 2018-01-16 ENCOUNTER — Inpatient Hospital Stay (HOSPITAL_COMMUNITY): Payer: PPO | Admitting: Vascular Surgery

## 2018-01-16 ENCOUNTER — Inpatient Hospital Stay (HOSPITAL_COMMUNITY)
Admission: RE | Admit: 2018-01-16 | Discharge: 2018-01-21 | DRG: 164 | Disposition: A | Discharge status: Home / Self Care | Payer: PPO | Source: Ambulatory Visit | Attending: Thoracic Surgery (Cardiothoracic Vascular Surgery) | Admitting: Thoracic Surgery (Cardiothoracic Vascular Surgery)

## 2018-01-16 ENCOUNTER — Other Ambulatory Visit: Payer: Self-pay

## 2018-01-16 ENCOUNTER — Encounter (HOSPITAL_COMMUNITY): Payer: Self-pay

## 2018-01-16 ENCOUNTER — Inpatient Hospital Stay (HOSPITAL_COMMUNITY): Payer: PPO | Admitting: Anesthesiology

## 2018-01-16 DIAGNOSIS — F17210 Nicotine dependence, cigarettes, uncomplicated: Secondary | ICD-10-CM | POA: Diagnosis present

## 2018-01-16 DIAGNOSIS — F419 Anxiety disorder, unspecified: Secondary | ICD-10-CM | POA: Diagnosis present

## 2018-01-16 DIAGNOSIS — E785 Hyperlipidemia, unspecified: Secondary | ICD-10-CM | POA: Diagnosis present

## 2018-01-16 DIAGNOSIS — Z6836 Body mass index (BMI) 36.0-36.9, adult: Secondary | ICD-10-CM

## 2018-01-16 DIAGNOSIS — J9811 Atelectasis: Secondary | ICD-10-CM | POA: Diagnosis not present

## 2018-01-16 DIAGNOSIS — E669 Obesity, unspecified: Secondary | ICD-10-CM | POA: Diagnosis present

## 2018-01-16 DIAGNOSIS — K219 Gastro-esophageal reflux disease without esophagitis: Secondary | ICD-10-CM | POA: Diagnosis present

## 2018-01-16 DIAGNOSIS — J449 Chronic obstructive pulmonary disease, unspecified: Secondary | ICD-10-CM | POA: Diagnosis present

## 2018-01-16 DIAGNOSIS — C3431 Malignant neoplasm of lower lobe, right bronchus or lung: Secondary | ICD-10-CM | POA: Diagnosis present

## 2018-01-16 DIAGNOSIS — J9382 Other air leak: Secondary | ICD-10-CM | POA: Diagnosis not present

## 2018-01-16 DIAGNOSIS — I2510 Atherosclerotic heart disease of native coronary artery without angina pectoris: Secondary | ICD-10-CM | POA: Diagnosis present

## 2018-01-16 DIAGNOSIS — I252 Old myocardial infarction: Secondary | ICD-10-CM | POA: Diagnosis not present

## 2018-01-16 DIAGNOSIS — Z902 Acquired absence of lung [part of]: Secondary | ICD-10-CM

## 2018-01-16 DIAGNOSIS — D696 Thrombocytopenia, unspecified: Secondary | ICD-10-CM | POA: Diagnosis not present

## 2018-01-16 DIAGNOSIS — Z9689 Presence of other specified functional implants: Secondary | ICD-10-CM

## 2018-01-16 DIAGNOSIS — K5900 Constipation, unspecified: Secondary | ICD-10-CM | POA: Diagnosis not present

## 2018-01-16 DIAGNOSIS — E1142 Type 2 diabetes mellitus with diabetic polyneuropathy: Secondary | ICD-10-CM | POA: Diagnosis present

## 2018-01-16 DIAGNOSIS — R21 Rash and other nonspecific skin eruption: Secondary | ICD-10-CM

## 2018-01-16 DIAGNOSIS — M1990 Unspecified osteoarthritis, unspecified site: Secondary | ICD-10-CM | POA: Diagnosis present

## 2018-01-16 DIAGNOSIS — G4733 Obstructive sleep apnea (adult) (pediatric): Secondary | ICD-10-CM | POA: Diagnosis present

## 2018-01-16 DIAGNOSIS — J939 Pneumothorax, unspecified: Secondary | ICD-10-CM

## 2018-01-16 DIAGNOSIS — Z4682 Encounter for fitting and adjustment of non-vascular catheter: Secondary | ICD-10-CM

## 2018-01-16 DIAGNOSIS — Z7982 Long term (current) use of aspirin: Secondary | ICD-10-CM | POA: Diagnosis not present

## 2018-01-16 DIAGNOSIS — G4730 Sleep apnea, unspecified: Secondary | ICD-10-CM | POA: Diagnosis present

## 2018-01-16 DIAGNOSIS — C342 Malignant neoplasm of middle lobe, bronchus or lung: Secondary | ICD-10-CM | POA: Diagnosis not present

## 2018-01-16 DIAGNOSIS — Z85828 Personal history of other malignant neoplasm of skin: Secondary | ICD-10-CM | POA: Diagnosis not present

## 2018-01-16 DIAGNOSIS — D649 Anemia, unspecified: Secondary | ICD-10-CM | POA: Diagnosis present

## 2018-01-16 DIAGNOSIS — I10 Essential (primary) hypertension: Secondary | ICD-10-CM | POA: Diagnosis present

## 2018-01-16 HISTORY — PX: VIDEO ASSISTED THORACOSCOPY (VATS)/ LOBECTOMY: SHX6169

## 2018-01-16 LAB — POCT I-STAT 7, (LYTES, BLD GAS, ICA,H+H)
Acid-base deficit: 4 mmol/L — ABNORMAL HIGH (ref 0.0–2.0)
Acid-base deficit: 4 mmol/L — ABNORMAL HIGH (ref 0.0–2.0)
Bicarbonate: 23.4 mmol/L (ref 20.0–28.0)
Bicarbonate: 23.7 mmol/L (ref 20.0–28.0)
Calcium, Ion: 1.2 mmol/L (ref 1.15–1.40)
Calcium, Ion: 1.17 mmol/L (ref 1.15–1.40)
HCT: 41 % (ref 39.0–52.0)
HCT: 35 % — ABNORMAL LOW (ref 39.0–52.0)
Hemoglobin: 13.9 g/dL (ref 13.0–17.0)
Hemoglobin: 11.9 g/dL — ABNORMAL LOW (ref 13.0–17.0)
O2 Saturation: 99 %
O2 Saturation: 100 %
Patient temperature: 37
Patient temperature: 37.6
Potassium: 4.9 mmol/L (ref 3.5–5.1)
Potassium: 5.1 mmol/L (ref 3.5–5.1)
Sodium: 139 mmol/L (ref 135–145)
Sodium: 139 mmol/L (ref 135–145)
TCO2: 25 mmol/L (ref 22–32)
TCO2: 25 mmol/L (ref 22–32)
pCO2 arterial: 53.4 mmHg — ABNORMAL HIGH (ref 32.0–48.0)
pCO2 arterial: 55.6 mmHg — ABNORMAL HIGH (ref 32.0–48.0)
pH, Arterial: 7.25 — ABNORMAL LOW (ref 7.350–7.450)
pH, Arterial: 7.241 — ABNORMAL LOW (ref 7.350–7.450)
pO2, Arterial: 158 mmHg — ABNORMAL HIGH (ref 83.0–108.0)
pO2, Arterial: 256 mmHg — ABNORMAL HIGH (ref 83.0–108.0)

## 2018-01-16 LAB — PREPARE RBC (CROSSMATCH)

## 2018-01-16 LAB — GLUCOSE, CAPILLARY
Glucose-Capillary: 137 mg/dL — ABNORMAL HIGH (ref 65–99)
Glucose-Capillary: 174 mg/dL — ABNORMAL HIGH (ref 65–99)

## 2018-01-16 LAB — POCT I-STAT 4, (NA,K, GLUC, HGB,HCT)
Glucose, Bld: 159 mg/dL — ABNORMAL HIGH (ref 65–99)
HCT: 35 % — ABNORMAL LOW (ref 39.0–52.0)
Hemoglobin: 11.9 g/dL — ABNORMAL LOW (ref 13.0–17.0)
Potassium: 4.7 mmol/L (ref 3.5–5.1)
Sodium: 140 mmol/L (ref 135–145)

## 2018-01-16 SURGERY — VIDEO ASSISTED THORACOSCOPY (VATS)/ LOBECTOMY
Anesthesia: General | Site: Chest | Laterality: Right

## 2018-01-16 MED ORDER — DEXTROSE 5 % IV SOLN
1.5000 g | INTRAVENOUS | Status: DC
  Filled 2018-01-16: qty 1.5

## 2018-01-16 MED ORDER — 0.9 % SODIUM CHLORIDE (POUR BTL) OPTIME
TOPICAL | Status: DC | PRN
  Administered 2018-01-16: 3000 mL

## 2018-01-16 MED ORDER — PROPOFOL 10 MG/ML IV BOLUS
INTRAVENOUS | Status: AC
  Filled 2018-01-16: qty 40

## 2018-01-16 MED ORDER — DEXAMETHASONE SODIUM PHOSPHATE 10 MG/ML IJ SOLN
INTRAMUSCULAR | Status: AC
  Filled 2018-01-16: qty 1

## 2018-01-16 MED ORDER — LACTATED RINGERS IV SOLN
INTRAVENOUS | Status: DC | PRN
  Administered 2018-01-16: 07:00:00 via INTRAVENOUS

## 2018-01-16 MED ORDER — METOCLOPRAMIDE HCL 5 MG/ML IJ SOLN
10.0000 mg | Freq: Four times a day (QID) | INTRAMUSCULAR | Status: AC
  Administered 2018-01-16 – 2018-01-17 (×4): 10 mg via INTRAVENOUS
  Filled 2018-01-16 (×4): qty 2

## 2018-01-16 MED ORDER — DEXTROSE 5 % IV SOLN
1.5000 g | Freq: Two times a day (BID) | INTRAVENOUS | Status: AC
  Administered 2018-01-16 – 2018-01-17 (×2): 1.5 g via INTRAVENOUS
  Filled 2018-01-16 (×2): qty 1.5

## 2018-01-16 MED ORDER — SUGAMMADEX SODIUM 200 MG/2ML IV SOLN
INTRAVENOUS | Status: DC | PRN
  Administered 2018-01-16: 200 mg via INTRAVENOUS

## 2018-01-16 MED ORDER — FENTANYL CITRATE (PF) 250 MCG/5ML IJ SOLN
INTRAMUSCULAR | Status: AC
  Filled 2018-01-16: qty 5

## 2018-01-16 MED ORDER — LEVALBUTEROL HCL 0.63 MG/3ML IN NEBU
0.6300 mg | INHALATION_SOLUTION | Freq: Three times a day (TID) | RESPIRATORY_TRACT | Status: DC
  Administered 2018-01-17 – 2018-01-21 (×13): 0.63 mg via RESPIRATORY_TRACT
  Filled 2018-01-16 (×13): qty 3

## 2018-01-16 MED ORDER — DEXTROSE 5 % IV SOLN
INTRAVENOUS | Status: DC | PRN
  Administered 2018-01-16: 40 ug/min via INTRAVENOUS

## 2018-01-16 MED ORDER — BUPIVACAINE HCL (PF) 0.25 % IJ SOLN
INTRAMUSCULAR | Status: AC
  Filled 2018-01-16: qty 10

## 2018-01-16 MED ORDER — SENNOSIDES-DOCUSATE SODIUM 8.6-50 MG PO TABS
1.0000 | ORAL_TABLET | Freq: Every day | ORAL | Status: DC
  Administered 2018-01-17 – 2018-01-20 (×4): 1 via ORAL
  Filled 2018-01-16 (×5): qty 1

## 2018-01-16 MED ORDER — PROMETHAZINE HCL 25 MG/ML IJ SOLN: 6.2500 mg | INTRAMUSCULAR | Status: DC | PRN

## 2018-01-16 MED ORDER — ORAL CARE MOUTH RINSE
15.0000 mL | Freq: Two times a day (BID) | OROMUCOSAL | Status: DC
  Administered 2018-01-16 – 2018-01-20 (×6): 15 mL via OROMUCOSAL

## 2018-01-16 MED ORDER — CLONAZEPAM 0.5 MG PO TABS
0.5000 mg | ORAL_TABLET | Freq: Every day | ORAL | Status: DC
  Administered 2018-01-16: 0.5 mg via ORAL
  Filled 2018-01-16: qty 1

## 2018-01-16 MED ORDER — ROCURONIUM BROMIDE 10 MG/ML (PF) SYRINGE
PREFILLED_SYRINGE | INTRAVENOUS | Status: AC
  Filled 2018-01-16: qty 5

## 2018-01-16 MED ORDER — INFLUENZA VAC SPLIT HIGH-DOSE 0.5 ML IM SUSY
0.5000 mL | PREFILLED_SYRINGE | INTRAMUSCULAR | Status: DC
  Filled 2018-01-16: qty 0.5

## 2018-01-16 MED ORDER — HYDROMORPHONE HCL 1 MG/ML IJ SOLN
0.2500 mg | INTRAMUSCULAR | Status: DC | PRN
  Administered 2018-01-16 (×6): 0.25 mg via INTRAVENOUS

## 2018-01-16 MED ORDER — ONDANSETRON HCL 4 MG/2ML IJ SOLN: 4.0000 mg | Freq: Four times a day (QID) | INTRAMUSCULAR | Status: DC | PRN

## 2018-01-16 MED ORDER — METOPROLOL SUCCINATE ER 50 MG PO TB24
50.0000 mg | ORAL_TABLET | Freq: Every day | ORAL | Status: DC
  Administered 2018-01-17 – 2018-01-21 (×5): 50 mg via ORAL
  Filled 2018-01-16 (×5): qty 1

## 2018-01-16 MED ORDER — DEXAMETHASONE SODIUM PHOSPHATE 10 MG/ML IJ SOLN
INTRAMUSCULAR | Status: DC | PRN
  Administered 2018-01-16: 10 mg via INTRAVENOUS

## 2018-01-16 MED ORDER — LIDOCAINE 2% (20 MG/ML) 5 ML SYRINGE
INTRAMUSCULAR | Status: AC
  Filled 2018-01-16: qty 5

## 2018-01-16 MED ORDER — PROPOFOL 10 MG/ML IV BOLUS
INTRAVENOUS | Status: DC | PRN
  Administered 2018-01-16: 150 mg via INTRAVENOUS

## 2018-01-16 MED ORDER — SODIUM CHLORIDE 0.9 % IV SOLN
10.0000 mL/h | Freq: Once | INTRAVENOUS | Status: AC
  Administered 2018-01-16: 10 mL/h via INTRAVENOUS

## 2018-01-16 MED ORDER — ACETAMINOPHEN 160 MG/5ML PO SOLN: 1000.0000 mg | Freq: Four times a day (QID) | ORAL | Status: DC

## 2018-01-16 MED ORDER — HYDROMORPHONE HCL 1 MG/ML IJ SOLN
INTRAMUSCULAR | Status: AC
  Administered 2018-01-16: 0.25 mg via INTRAVENOUS
  Filled 2018-01-16: qty 1

## 2018-01-16 MED ORDER — SUCCINYLCHOLINE CHLORIDE 200 MG/10ML IV SOSY
PREFILLED_SYRINGE | INTRAVENOUS | Status: AC
  Filled 2018-01-16: qty 10

## 2018-01-16 MED ORDER — LISINOPRIL 10 MG PO TABS
10.0000 mg | ORAL_TABLET | Freq: Every day | ORAL | Status: DC
  Administered 2018-01-17 – 2018-01-21 (×5): 10 mg via ORAL
  Filled 2018-01-16 (×5): qty 1

## 2018-01-16 MED ORDER — FENTANYL CITRATE (PF) 100 MCG/2ML IJ SOLN
INTRAMUSCULAR | Status: DC | PRN
  Administered 2018-01-16: 100 ug via INTRAVENOUS
  Administered 2018-01-16 (×2): 50 ug via INTRAVENOUS
  Administered 2018-01-16: 100 ug via INTRAVENOUS
  Administered 2018-01-16: 150 ug via INTRAVENOUS
  Administered 2018-01-16 (×3): 50 ug via INTRAVENOUS
  Administered 2018-01-16: 150 ug via INTRAVENOUS
  Administered 2018-01-16 (×2): 50 ug via INTRAVENOUS
  Administered 2018-01-16: 150 ug via INTRAVENOUS
  Administered 2018-01-16 (×3): 50 ug via INTRAVENOUS
  Administered 2018-01-16: 100 ug via INTRAVENOUS

## 2018-01-16 MED ORDER — SIMVASTATIN 40 MG PO TABS
40.0000 mg | ORAL_TABLET | Freq: Every day | ORAL | Status: DC
  Administered 2018-01-16 – 2018-01-20 (×5): 40 mg via ORAL
  Filled 2018-01-16 (×5): qty 1

## 2018-01-16 MED ORDER — BISACODYL 5 MG PO TBEC
10.0000 mg | DELAYED_RELEASE_TABLET | Freq: Every day | ORAL | Status: DC
  Administered 2018-01-17 – 2018-01-20 (×4): 10 mg via ORAL
  Filled 2018-01-16 (×4): qty 2

## 2018-01-16 MED ORDER — ALBUTEROL SULFATE (2.5 MG/3ML) 0.083% IN NEBU
2.5000 mg | INHALATION_SOLUTION | Freq: Once | RESPIRATORY_TRACT | Status: AC
  Administered 2018-01-16: 2.5 mg via RESPIRATORY_TRACT

## 2018-01-16 MED ORDER — MIDAZOLAM HCL 2 MG/2ML IJ SOLN: 0.5000 mg | Freq: Once | INTRAMUSCULAR | Status: DC | PRN

## 2018-01-16 MED ORDER — MIDAZOLAM HCL 2 MG/2ML IJ SOLN
INTRAMUSCULAR | Status: AC
  Filled 2018-01-16: qty 2

## 2018-01-16 MED ORDER — BUPIVACAINE HCL (PF) 0.5 % IJ SOLN
INTRAMUSCULAR | Status: AC
  Filled 2018-01-16: qty 10

## 2018-01-16 MED ORDER — OXYCODONE HCL 5 MG PO TABS
5.0000 mg | ORAL_TABLET | ORAL | Status: DC | PRN
  Administered 2018-01-17 – 2018-01-19 (×5): 5 mg via ORAL
  Filled 2018-01-16 (×5): qty 1

## 2018-01-16 MED ORDER — BUPIVACAINE ON-Q PAIN PUMP (FOR ORDER SET NO CHG)
INJECTION | Status: AC
  Filled 2018-01-16: qty 1

## 2018-01-16 MED ORDER — DEXTROSE-NACL 5-0.9 % IV SOLN
INTRAVENOUS | Status: DC
  Administered 2018-01-16: 21:00:00 via INTRAVENOUS
  Administered 2018-01-17: 100 mL/h via INTRAVENOUS

## 2018-01-16 MED ORDER — HEMOSTATIC AGENTS (NO CHARGE) OPTIME
TOPICAL | Status: DC | PRN
  Administered 2018-01-16: 1 via TOPICAL

## 2018-01-16 MED ORDER — ASPIRIN 325 MG PO TABS
325.0000 mg | ORAL_TABLET | Freq: Every day | ORAL | Status: DC
  Administered 2018-01-17 – 2018-01-21 (×5): 325 mg via ORAL
  Filled 2018-01-16 (×5): qty 1

## 2018-01-16 MED ORDER — ENOXAPARIN SODIUM 40 MG/0.4ML ~~LOC~~ SOLN
40.0000 mg | Freq: Every day | SUBCUTANEOUS | Status: DC
  Administered 2018-01-17 – 2018-01-20 (×4): 40 mg via SUBCUTANEOUS
  Filled 2018-01-16 (×5): qty 0.4

## 2018-01-16 MED ORDER — PHENYLEPHRINE 40 MCG/ML (10ML) SYRINGE FOR IV PUSH (FOR BLOOD PRESSURE SUPPORT)
PREFILLED_SYRINGE | INTRAVENOUS | Status: AC
  Filled 2018-01-16: qty 10

## 2018-01-16 MED ORDER — ALBUTEROL SULFATE (2.5 MG/3ML) 0.083% IN NEBU: 2.5000 mg | INHALATION_SOLUTION | Freq: Four times a day (QID) | RESPIRATORY_TRACT | Status: DC | PRN

## 2018-01-16 MED ORDER — LACTATED RINGERS IV SOLN
INTRAVENOUS | Status: DC | PRN
  Administered 2018-01-16 (×2): via INTRAVENOUS

## 2018-01-16 MED ORDER — DIPHENHYDRAMINE HCL 50 MG/ML IJ SOLN: 12.5000 mg | Freq: Four times a day (QID) | INTRAMUSCULAR | Status: DC | PRN

## 2018-01-16 MED ORDER — SUGAMMADEX SODIUM 200 MG/2ML IV SOLN
INTRAVENOUS | Status: AC
  Filled 2018-01-16: qty 2

## 2018-01-16 MED ORDER — POTASSIUM CHLORIDE 10 MEQ/50ML IV SOLN: 10.0000 meq | Freq: Every day | INTRAVENOUS | Status: DC | PRN

## 2018-01-16 MED ORDER — ROCURONIUM BROMIDE 100 MG/10ML IV SOLN
INTRAVENOUS | Status: DC | PRN
  Administered 2018-01-16: 30 mg via INTRAVENOUS
  Administered 2018-01-16: 20 mg via INTRAVENOUS
  Administered 2018-01-16: 10 mg via INTRAVENOUS
  Administered 2018-01-16: 20 mg via INTRAVENOUS
  Administered 2018-01-16: 30 mg via INTRAVENOUS
  Administered 2018-01-16: 10 mg via INTRAVENOUS
  Administered 2018-01-16: 20 mg via INTRAVENOUS
  Administered 2018-01-16: 50 mg via INTRAVENOUS

## 2018-01-16 MED ORDER — ALBUMIN HUMAN 5 % IV SOLN
INTRAVENOUS | Status: DC | PRN
  Administered 2018-01-16: 13:00:00 via INTRAVENOUS

## 2018-01-16 MED ORDER — MEPERIDINE HCL 50 MG/ML IJ SOLN: 6.2500 mg | INTRAMUSCULAR | Status: DC | PRN

## 2018-01-16 MED ORDER — NITROGLYCERIN IN D5W 200-5 MCG/ML-% IV SOLN
INTRAVENOUS | Status: DC | PRN
Start: 2018-01-16 — End: 2018-01-16
  Administered 2018-01-16: 5 ug/min via INTRAVENOUS

## 2018-01-16 MED ORDER — BUPIVACAINE HCL (PF) 0.5 % IJ SOLN
INTRAMUSCULAR | Status: DC | PRN
Start: 2018-01-16 — End: 2018-01-16
  Administered 2018-01-16: 5 mL

## 2018-01-16 MED ORDER — SODIUM CHLORIDE 0.9% FLUSH: 9.0000 mL | INTRAVENOUS | Status: DC | PRN

## 2018-01-16 MED ORDER — BACLOFEN 5 MG HALF TABLET
20.0000 mg | ORAL_TABLET | Freq: Two times a day (BID) | ORAL | Status: DC | PRN
  Administered 2018-01-20: 20 mg via ORAL
  Filled 2018-01-16: qty 4

## 2018-01-16 MED ORDER — ALBUTEROL SULFATE (2.5 MG/3ML) 0.083% IN NEBU
INHALATION_SOLUTION | RESPIRATORY_TRACT | Status: AC
  Administered 2018-01-16: 2.5 mg via RESPIRATORY_TRACT
  Filled 2018-01-16: qty 3

## 2018-01-16 MED ORDER — ONDANSETRON HCL 4 MG/2ML IJ SOLN
INTRAMUSCULAR | Status: AC
  Filled 2018-01-16: qty 2

## 2018-01-16 MED ORDER — TRAMADOL HCL 50 MG PO TABS: 50.0000 mg | ORAL_TABLET | Freq: Four times a day (QID) | ORAL | Status: DC | PRN

## 2018-01-16 MED ORDER — HYDROMORPHONE HCL 1 MG/ML IJ SOLN
INTRAMUSCULAR | Status: AC
  Filled 2018-01-16: qty 1

## 2018-01-16 MED ORDER — MIDAZOLAM HCL 5 MG/5ML IJ SOLN
INTRAMUSCULAR | Status: DC | PRN
  Administered 2018-01-16: 1 mg via INTRAVENOUS

## 2018-01-16 MED ORDER — ACETAMINOPHEN 500 MG PO TABS
1000.0000 mg | ORAL_TABLET | Freq: Four times a day (QID) | ORAL | Status: DC
  Administered 2018-01-16 – 2018-01-21 (×15): 1000 mg via ORAL
  Filled 2018-01-16 (×16): qty 2

## 2018-01-16 MED ORDER — LEVALBUTEROL HCL 0.63 MG/3ML IN NEBU
0.6300 mg | INHALATION_SOLUTION | Freq: Four times a day (QID) | RESPIRATORY_TRACT | Status: DC
  Administered 2018-01-16: 0.63 mg via RESPIRATORY_TRACT
  Filled 2018-01-16: qty 3

## 2018-01-16 MED ORDER — FENTANYL 40 MCG/ML IV SOLN
INTRAVENOUS | Status: DC
  Administered 2018-01-16: 0 ug via INTRAVENOUS
  Administered 2018-01-16: 16:00:00 via INTRAVENOUS
  Administered 2018-01-16 – 2018-01-17 (×2): 0 ug via INTRAVENOUS
  Filled 2018-01-16 (×2): qty 25

## 2018-01-16 MED ORDER — NALOXONE HCL 0.4 MG/ML IJ SOLN: 0.4000 mg | INTRAMUSCULAR | Status: DC | PRN

## 2018-01-16 MED ORDER — DIPHENHYDRAMINE HCL 12.5 MG/5ML PO ELIX
12.5000 mg | ORAL_SOLUTION | Freq: Four times a day (QID) | ORAL | Status: DC | PRN
  Administered 2018-01-17: 12.5 mg via ORAL
  Filled 2018-01-16: qty 5

## 2018-01-16 MED ORDER — EPHEDRINE 5 MG/ML INJ
INTRAVENOUS | Status: AC
  Filled 2018-01-16: qty 10

## 2018-01-16 MED ORDER — BUPIVACAINE 0.5 % ON-Q PUMP SINGLE CATH 400 ML
400.0000 mL | INJECTION | Status: AC
  Administered 2018-01-16: 400 mL
  Filled 2018-01-16: qty 400

## 2018-01-16 MED ORDER — LACTATED RINGERS IV SOLN
INTRAVENOUS | Status: DC | PRN
  Administered 2018-01-16 (×2): via INTRAVENOUS

## 2018-01-16 MED ORDER — ONDANSETRON HCL 4 MG/2ML IJ SOLN
INTRAMUSCULAR | Status: DC | PRN
  Administered 2018-01-16: 4 mg via INTRAVENOUS

## 2018-01-16 SURGICAL SUPPLY — 102 items
ADH SKN CLS APL DERMABOND .7 (GAUZE/BANDAGES/DRESSINGS) ×2
APPLIER CLIP ROT 10 11.4 M/L (STAPLE)
APR CLP MED LRG 11.4X10 (STAPLE)
BAG SPEC RTRVL LRG 6X4 10 (ENDOMECHANICALS)
CANISTER SUCT 3000ML PPV (MISCELLANEOUS) ×3 IMPLANT
CATH KIT ON Q 5IN SLV (PAIN MANAGEMENT) IMPLANT
CATH KIT ON-Q SILVERSOAK 5 (CATHETERS) IMPLANT
CATH KIT ON-Q SILVERSOAK 5IN (CATHETERS) ×3 IMPLANT
CATH THORACIC 28FR (CATHETERS) IMPLANT
CATH THORACIC 28FR RT ANG (CATHETERS) IMPLANT
CATH THORACIC 36FR (CATHETERS) IMPLANT
CATH THORACIC 36FR RT ANG (CATHETERS) IMPLANT
CLIP APPLIE ROT 10 11.4 M/L (STAPLE) IMPLANT
CLIP VESOCCLUDE MED 6/CT (CLIP) ×5 IMPLANT
CONN ST 1/4X3/8  BEN (MISCELLANEOUS) ×1
CONN ST 1/4X3/8 BEN (MISCELLANEOUS) ×1 IMPLANT
CONN Y 3/8X3/8X3/8  BEN (MISCELLANEOUS) ×1
CONN Y 3/8X3/8X3/8 BEN (MISCELLANEOUS) ×1 IMPLANT
CONT SPEC 4OZ CLIKSEAL STRL BL (MISCELLANEOUS) ×16 IMPLANT
COVER SURGICAL LIGHT HANDLE (MISCELLANEOUS) IMPLANT
DERMABOND ADVANCED (GAUZE/BANDAGES/DRESSINGS) ×1
DERMABOND ADVANCED .7 DNX12 (GAUZE/BANDAGES/DRESSINGS) ×1 IMPLANT
DRAIN CHANNEL 28F RND 3/8 FF (WOUND CARE) IMPLANT
DRAIN CHANNEL 32F RND 10.7 FF (WOUND CARE) IMPLANT
DRAPE LAPAROSCOPIC ABDOMINAL (DRAPES) ×3 IMPLANT
DRAPE WARM FLUID 44X44 (DRAPE) ×3 IMPLANT
ELECT BLADE 6.5 EXT (BLADE) ×3 IMPLANT
ELECT REM PT RETURN 9FT ADLT (ELECTROSURGICAL) ×3
ELECTRODE REM PT RTRN 9FT ADLT (ELECTROSURGICAL) ×2 IMPLANT
GAUZE SPONGE 4X4 12PLY STRL (GAUZE/BANDAGES/DRESSINGS) ×3 IMPLANT
GAUZE SPONGE 4X4 12PLY STRL LF (GAUZE/BANDAGES/DRESSINGS) ×2 IMPLANT
GLOVE BIO SURGEON STRL SZ 6.5 (GLOVE) ×2 IMPLANT
GLOVE BIO SURGEON STRL SZ7 (GLOVE) ×2 IMPLANT
GLOVE BIOGEL PI IND STRL 6 (GLOVE) ×1 IMPLANT
GLOVE BIOGEL PI IND STRL 6.5 (GLOVE) ×2 IMPLANT
GLOVE BIOGEL PI IND STRL 7.0 (GLOVE) ×1 IMPLANT
GLOVE BIOGEL PI INDICATOR 6 (GLOVE) ×1
GLOVE BIOGEL PI INDICATOR 6.5 (GLOVE) ×2
GLOVE BIOGEL PI INDICATOR 7.0 (GLOVE) ×1
GLOVE SURG SIGNA 7.5 PF LTX (GLOVE) ×6 IMPLANT
GOWN STRL REUS W/ TWL LRG LVL3 (GOWN DISPOSABLE) ×4 IMPLANT
GOWN STRL REUS W/ TWL XL LVL3 (GOWN DISPOSABLE) ×2 IMPLANT
GOWN STRL REUS W/TWL LRG LVL3 (GOWN DISPOSABLE) ×6
GOWN STRL REUS W/TWL XL LVL3 (GOWN DISPOSABLE) ×3
HEMOSTAT SURGICEL 2X14 (HEMOSTASIS) IMPLANT
KIT BASIN OR (CUSTOM PROCEDURE TRAY) ×3 IMPLANT
KIT ROOM TURNOVER OR (KITS) ×3 IMPLANT
KIT SUCTION CATH 14FR (SUCTIONS) IMPLANT
NS IRRIG 1000ML POUR BTL (IV SOLUTION) ×9 IMPLANT
PACK CHEST (CUSTOM PROCEDURE TRAY) ×3 IMPLANT
PAD ARMBOARD 7.5X6 YLW CONV (MISCELLANEOUS) ×6 IMPLANT
POUCH ENDO CATCH II 15MM (MISCELLANEOUS) ×2 IMPLANT
POUCH SPECIMEN RETRIEVAL 10MM (ENDOMECHANICALS) IMPLANT
RELOAD STAPLE 35X2.5 WHT THIN (STAPLE) IMPLANT
RELOAD STAPLE 60 3.8 GOLD REG (STAPLE) IMPLANT
RELOAD STAPLE 60 4.1 GRN THCK (STAPLE) IMPLANT
RELOAD STAPLER GOLD 60MM (STAPLE) ×22 IMPLANT
RELOAD STAPLER GREEN 60MM (STAPLE) ×14 IMPLANT
SCISSORS ENDO CVD 5DCS (MISCELLANEOUS) IMPLANT
SEALANT PROGEL (MISCELLANEOUS) IMPLANT
SEALANT SURG COSEAL 4ML (VASCULAR PRODUCTS) IMPLANT
SEALANT SURG COSEAL 8ML (VASCULAR PRODUCTS) IMPLANT
SHEARS HARMONIC HDI 20CM (ELECTROSURGICAL) IMPLANT
SOLUTION ANTI FOG 6CC (MISCELLANEOUS) ×3 IMPLANT
SPECIMEN JAR MEDIUM (MISCELLANEOUS) IMPLANT
SPONGE INTESTINAL PEANUT (DISPOSABLE) ×16 IMPLANT
SPONGE TONSIL 1 RF SGL (DISPOSABLE) ×5 IMPLANT
STAPLE ECHEON FLEX 60 POW ENDO (STAPLE) ×2 IMPLANT
STAPLE RELOAD 2.5MM WHITE (STAPLE) ×36 IMPLANT
STAPLER RELOAD GOLD 60MM (STAPLE) ×33
STAPLER RELOAD GREEN 60MM (STAPLE) ×21
STAPLER VASCULAR ECHELON 35 (CUTTER) ×2 IMPLANT
SUT PROLENE 3 0 SH 1 (SUTURE) ×2 IMPLANT
SUT PROLENE 3 0 SH DA (SUTURE) ×2 IMPLANT
SUT PROLENE 4 0 RB 1 (SUTURE) ×3
SUT PROLENE 4 0 SH DA (SUTURE) ×2 IMPLANT
SUT PROLENE 4-0 RB1 .5 CRCL 36 (SUTURE) ×1 IMPLANT
SUT SILK  1 MH (SUTURE) ×2
SUT SILK 1 MH (SUTURE) ×4 IMPLANT
SUT SILK 1 TIES 10X30 (SUTURE) ×3 IMPLANT
SUT SILK 2 0 SH (SUTURE) IMPLANT
SUT SILK 2 0SH CR/8 30 (SUTURE) ×2 IMPLANT
SUT SILK 3 0 SH 30 (SUTURE) IMPLANT
SUT SILK 3 0SH CR/8 30 (SUTURE) ×3 IMPLANT
SUT VIC AB 1 CTX 36 (SUTURE) ×3
SUT VIC AB 1 CTX36XBRD ANBCTR (SUTURE) ×2 IMPLANT
SUT VIC AB 2-0 CTX 36 (SUTURE) ×3 IMPLANT
SUT VIC AB 3-0 MH 27 (SUTURE) IMPLANT
SUT VIC AB 3-0 X1 27 (SUTURE) ×3 IMPLANT
SUT VICRYL 2 TP 1 (SUTURE) IMPLANT
SYR 10ML LL (SYRINGE) ×3 IMPLANT
SYSTEM SAHARA CHEST DRAIN ATS (WOUND CARE) ×5 IMPLANT
TAPE CLOTH 4X10 WHT NS (GAUZE/BANDAGES/DRESSINGS) ×3 IMPLANT
TAPE CLOTH SURG 6X10 WHT LF (GAUZE/BANDAGES/DRESSINGS) ×2 IMPLANT
TIP APPLICATOR SPRAY EXTEND 16 (VASCULAR PRODUCTS) IMPLANT
TOWEL GREEN STERILE (TOWEL DISPOSABLE) ×3 IMPLANT
TOWEL GREEN STERILE FF (TOWEL DISPOSABLE) ×3 IMPLANT
TRAY FOLEY W/METER SILVER 16FR (SET/KITS/TRAYS/PACK) ×3 IMPLANT
TROCAR XCEL BLADELESS 5X75MML (TROCAR) ×3 IMPLANT
TUBE CONNECTING 20X1/4 (TUBING) ×2 IMPLANT
TUNNELER SHEATH ON-Q 11GX8 DSP (PAIN MANAGEMENT) ×2 IMPLANT
WATER STERILE IRR 1000ML POUR (IV SOLUTION) ×3 IMPLANT

## 2018-01-16 NOTE — Progress Notes (Signed)
Patient ID: Christian [name], male   DOB: October 01, 1946, 72 y.o.   MRN: [id number] EVENING ROUNDS NOTE :     [address]       [place] [address]             [phone]                 Day of Surgery Procedure(s) (LRB): VIDEO ASSISTED THORACOSCOPY (VATS)/RIGHT MIDDLE AND LOWER LOBE LUNG LOBECTOMY WITH NODE BIOPSIES x6 (Right)  Total Length of Stay:  LOS: 0 days  BP 101/65   Pulse 85   Temp 98.1 F (36.7 C) (Oral)   Resp 12   Wt 266 lb (120.7 kg)   SpO2 96%   BMI 36.08 kg/m   .Intake/Output      02/08 0701 - 02/09 0700   I.V. (mL/kg) 3400 (28.2)   Other 100   IV Piggyback 750   Total Intake(mL/kg) 4250 (35.2)   Urine (mL/kg/hr) 1050 (0.6)   Blood 1325   Chest Tube 400   Total Output 2775   Net +1475         . bupivacaine ON-Q pain pump    . cefUROXime (ZINACEF)  IV    . dextrose 5 % and 0.9% NaCl    . potassium chloride       Lab Results  Component Value Date   WBC 8.2 01/14/2018   HGB 11.9 (L) 01/16/2018   HCT 35.0 (L) 01/16/2018   PLT 162 01/14/2018   GLUCOSE 159 (H) 01/16/2018   CHOL 97 07/30/2017   TRIG 60.0 07/30/2017   HDL 31.80 (L) 07/30/2017   LDLCALC 53 07/30/2017   ALT 20 01/14/2018   AST 22 01/14/2018   NA 139 01/16/2018   K 5.1 01/16/2018   CL 108 01/14/2018   CREATININE 1.18 01/14/2018   BUN 19 01/14/2018   CO2 19 (L) 01/14/2018   PSA 1.63 07/30/2017   INR 1.10 01/14/2018   HGBA1C 6.1 (H) 12/15/2017   Good cough 2+ air leak Vs stable Not bleeding    Grace Isaac MD  Beeper [phone] Office [phone] 01/16/2018 8:34 PM

## 2018-01-16 NOTE — Transfer of Care (Signed)
Immediate Anesthesia Transfer of Care Note  Patient: Christian [name]  Procedure(s) Performed: VIDEO ASSISTED THORACOSCOPY (VATS)/RIGHT MIDDLE AND LOWER LOBE LUNG LOBECTOMY WITH NODE BIOPSIES x6 (Right Chest)  Patient Location: PACU  Anesthesia Type:General  Level of Consciousness: awake, sedated, patient cooperative and pateint uncooperative  Airway & Oxygen Therapy: Patient Spontanous Breathing and Patient connected to face mask oxygen  Post-op Assessment: Report given to RN, Post -op Vital signs reviewed and stable, Patient moving all extremities and Patient moving all extremities X 4  Post vital signs: Reviewed and stable  Last Vitals:  Vitals:   01/16/18 0552 01/16/18 1527  BP: 120/72 (!) 93/56  Pulse:  97  Resp:  (!) 24  Temp:  (!) 36.1 C  SpO2:  97%    Last Pain:  Vitals:   01/16/18 0614  TempSrc:   PainSc: 0-No pain      Patients Stated Pain Goal: 3 (19/59/74 7185)  Complications: No apparent anesthesia complications

## 2018-01-16 NOTE — Anesthesia Postprocedure Evaluation (Signed)
Anesthesia Post Note  Patient: Christian [name]  Procedure(s) Performed: VIDEO ASSISTED THORACOSCOPY (VATS)/RIGHT MIDDLE AND LOWER LOBE LUNG LOBECTOMY WITH NODE BIOPSIES x6 (Right Chest)     Patient location during evaluation: PACU Anesthesia Type: General Level of consciousness: sedated, patient cooperative and oriented Pain management: pain level controlled (able to rest, sore when moves) Vital Signs Assessment: post-procedure vital signs reviewed and stable Respiratory status: spontaneous breathing, nonlabored ventilation, respiratory function stable and patient connected to face mask oxygen Cardiovascular status: blood pressure returned to baseline and stable Postop Assessment: no apparent nausea or vomiting Anesthetic complications: no    Last Vitals:  Vitals:   01/16/18 1555 01/16/18 1559  BP: 115/61   Pulse: (!) 103   Resp: (!) 25 (!) 33  Temp:    SpO2: 91% 93%    Last Pain:  Vitals:   01/16/18 1559  TempSrc:   PainSc: 5                  Ebba Goll,E. Sadeel Fiddler

## 2018-01-16 NOTE — Interval H&P Note (Signed)
History and Physical Interval Note:  Stress test was "high risk" with inferior MI and peri-infarct ischemia. Felt Ok to proceed with thoracic surgery by Dr. Earlie Counts who feels this is no different than his last stress test. Dr. Glennon Mac confirmed with Dr. Earlie Counts by telephone yesterday. Patient aware of high risk of perioperative MI. 01/16/2018 7:26 AM  Christian [name]  has presented today for surgery, with the diagnosis of RLL ADENOCARCINOMA  The various methods of treatment have been discussed with the patient and family. After consideration of risks, benefits and other options for treatment, the patient has consented to  Procedure(s): VIDEO ASSISTED THORACOSCOPY (VATS)/RIGHT LOWER LOBECTOMY (Right) POSSIBLE BILOBECTOMY (Right) as a surgical intervention .  The patient's history has been reviewed, patient examined, no change in status, stable for surgery.  I have reviewed the patient's chart and labs.  Questions were answered to the patient's satisfaction.     Christian [name]

## 2018-01-16 NOTE — Anesthesia Procedure Notes (Signed)
Central Venous Catheter Insertion Performed by: Annye Asa, MD, anesthesiologist Start/End2/07/2018 7:01 AM, 01/16/2018 7:15 AM Patient location: Pre-op. Preanesthetic checklist: patient identified, IV checked, site marked, risks and benefits discussed, surgical consent, monitors and equipment checked, pre-op evaluation, timeout performed and anesthesia consent Position: supine Lidocaine 1% used for infiltration and patient sedated Hand hygiene performed , maximum sterile barriers used  and Seldinger technique used Catheter size: 8 Fr Central line was placed.Double lumen Procedure performed using ultrasound guided technique. Ultrasound Notes:anatomy identified, needle tip was noted to be adjacent to the nerve/plexus identified, no ultrasound evidence of intravascular and/or intraneural injection and image(s) printed for medical record Attempts: 1 Following insertion, line sutured, dressing applied and Biopatch. Post procedure assessment: no air, free fluid flow and blood return through all ports  Patient tolerated the procedure well with no immediate complications. Additional procedure comments: CVP: Timeout, sterile prep, drape, FBP R neck.  Supine position.  1% lido local, finder and trocar RIJ 1st pass with US guidance.  2 lumen placed over J wire. Biopatch and sterile dressing on.  Patient tolerated well.  VSS.  Jenita Seashore, MD.

## 2018-01-16 NOTE — Brief Op Note (Addendum)
01/16/2018  2:47 PM  PATIENT:  Christian [name]  72 y.o. male  PRE-OPERATIVE DIAGNOSIS:  RLL ADENOCARCINOMA  POST-OPERATIVE DIAGNOSIS:  RLL  ADENOCARCINOMA  PROCEDURE:  Procedure(s):  VIDEO ASSISTED THORACOSCOPY   RIGHT LOWER AND MIDDLE BILOBECTOMY  LYMPH NODE DISSECTION  INSERTION OF ON-Q ANESTHETIC CATHETER  SURGEON:  Surgeon(s) and Role:    * Melrose Nakayama, MD - Primary  PHYSICIAN ASSISTANT: Ellwood Handler PA-C  ANESTHESIA:   general  EBL:  1325 mL   BLOOD ADMINISTERED:none  DRAINS: Cleone Slim and 36 Chest tube right chest   LOCAL MEDICATIONS USED:  MARCAINE     SPECIMEN:  Source of Specimen:  Right middle lobe, right lower lobe  DISPOSITION OF SPECIMEN:  PATHOLOGY  COUNTS:  YES  TOURNIQUET:  * No tourniquets in log *  DICTATION: .Dragon Dictation  PLAN OF CARE: Admit to inpatient   PATIENT DISPOSITION:  PACU - hemodynamically stable.   Delay start of Pharmacological VTE agent (>24hrs) due to surgical blood loss or risk of bleeding: yes

## 2018-01-16 NOTE — Anesthesia Procedure Notes (Signed)
Arterial Line Insertion Start/End2/07/2018 7:00 AM Performed by: Wilburn Cornelia, CRNA, CRNA  Patient location: Pre-op. Lidocaine 1% used for infiltration Right, radial was placed Catheter size: 20 G Hand hygiene performed  and maximum sterile barriers used   Attempts: 1 Procedure performed without using ultrasound guided technique. Following insertion, Biopatch. Post procedure assessment: unchanged  Patient tolerated the procedure well with no immediate complications.

## 2018-01-16 NOTE — Anesthesia Procedure Notes (Signed)
Procedure Name: Intubation Date/Time: 01/16/2018 7:49 AM Performed by: Izora Gala, CRNA Pre-anesthesia Checklist: Patient identified, Emergency Drugs available, Suction available and Patient being monitored Patient Re-evaluated:Patient Re-evaluated prior to induction Oxygen Delivery Method: Circle system utilized Preoxygenation: Pre-oxygenation with 100% oxygen Induction Type: IV induction Ventilation: Mask ventilation without difficulty and Oral airway inserted - appropriate to patient size Laryngoscope Size: Miller and 3 Grade View: Grade I Endobronchial tube: Double lumen EBT and Left and 39 Fr Number of attempts: 1 Airway Equipment and Method: Stylet Placement Confirmation: ETT inserted through vocal cords under direct vision,  positive ETCO2 and breath sounds checked- equal and bilateral Secured at: 31 cm Tube secured with: Tape Dental Injury: Teeth and Oropharynx as per pre-operative assessment

## 2018-01-17 ENCOUNTER — Encounter (HOSPITAL_COMMUNITY): Payer: Self-pay | Admitting: Thoracic Surgery (Cardiothoracic Vascular Surgery)

## 2018-01-17 ENCOUNTER — Inpatient Hospital Stay (HOSPITAL_COMMUNITY): Payer: PPO

## 2018-01-17 LAB — BASIC METABOLIC PANEL
Anion gap: 9 (ref 5–15)
BUN: 13 mg/dL (ref 6–20)
CO2: 22 mmol/L (ref 22–32)
Calcium: 8.2 mg/dL — ABNORMAL LOW (ref 8.9–10.3)
Chloride: 105 mmol/L (ref 101–111)
Creatinine, Ser: 0.91 mg/dL (ref 0.61–1.24)
GFR calc Af Amer: >60 mL/min (ref >60)
GFR calc non Af Amer: >60 mL/min (ref >60)
Glucose, Bld: 175 mg/dL — ABNORMAL HIGH (ref 65–99)
Potassium: 4.4 mmol/L (ref 3.5–5.1)
Sodium: 136 mmol/L (ref 135–145)

## 2018-01-17 LAB — BLOOD GAS, ARTERIAL
Acid-base deficit: 1.5 mmol/L (ref 0.0–2.0)
Bicarbonate: 23.5 mmol/L (ref 20.0–28.0)
O2 Content: 4 L/min
O2 Saturation: 98.6 %
Patient temperature: 98.6
pCO2 arterial: 45.3 mmHg (ref 32.0–48.0)
pH, Arterial: 7.336 — ABNORMAL LOW (ref 7.350–7.450)
pO2, Arterial: 154 mmHg — ABNORMAL HIGH (ref 83.0–108.0)

## 2018-01-17 LAB — CBC
HCT: 37.9 % — ABNORMAL LOW (ref 39.0–52.0)
Hemoglobin: 12.4 g/dL — ABNORMAL LOW (ref 13.0–17.0)
MCH: 34.4 pg — ABNORMAL HIGH (ref 26.0–34.0)
MCHC: 32.7 g/dL (ref 30.0–36.0)
MCV: 105.3 fL — ABNORMAL HIGH (ref 78.0–100.0)
Platelets: 141 10*3/uL — ABNORMAL LOW (ref 150–400)
RBC: 3.6 MIL/uL — ABNORMAL LOW (ref 4.22–5.81)
RDW: 13.6 % (ref 11.5–15.5)
WBC: 14.2 10*3/uL — ABNORMAL HIGH (ref 4.0–10.5)

## 2018-01-17 MED ORDER — NICOTINE 14 MG/24HR TD PT24
14.0000 mg | MEDICATED_PATCH | Freq: Every day | TRANSDERMAL | Status: DC
  Administered 2018-01-17 – 2018-01-21 (×5): 14 mg via TRANSDERMAL
  Filled 2018-01-17 (×4): qty 1

## 2018-01-17 MED ORDER — INFLUENZA VAC SPLIT HIGH-DOSE 0.5 ML IM SUSY: 0.5000 mL | PREFILLED_SYRINGE | INTRAMUSCULAR | Status: DC | PRN

## 2018-01-17 MED ORDER — CLONAZEPAM 0.5 MG PO TABS
0.5000 mg | ORAL_TABLET | Freq: Two times a day (BID) | ORAL | Status: DC
  Administered 2018-01-17 – 2018-01-21 (×8): 0.5 mg via ORAL
  Filled 2018-01-17 (×8): qty 1

## 2018-01-17 NOTE — Op Note (Signed)
NAME:  DONTRAVIOUS, CAMILLE NO.:  [id number]  MEDICAL RECORD NO.:  [id number]  LOCATION:                                 FACILITY:  PHYSICIAN:  Revonda Standard. Roxan Hockey, M.D. DATE OF BIRTH:  DATE OF PROCEDURE:  01/16/2018 DATE OF DISCHARGE:                              OPERATIVE REPORT   PREOPERATIVE DIAGNOSIS:  Adenocarcinoma, right lower lobe.  POSTOPERATIVE DIAGNOSIS:  Adenocarcinoma, right lower lobe.  PROCEDURES:   Right video-assisted thoracoscopy, Right lower and middle bilobectomy, Lymph node dissection, and Insertion of On-Q local anesthetic catheter.  SURGEON:  Revonda Standard. Roxan Hockey, MD.  ASSISTANTEllwood Handler, PA.  ANESTHESIA:  General.  FINDINGS:  No abnormality of visceral or parietal pleura.  Procedure difficult due to the patient body habitus and size of lung.  Suspicious level 12 node at the origin of the right middle lobe artery.  On frozen section, no tumor seen.  Bronchial margin negative for tumor.  CLINICAL NOTE:  Mr. Scarlata is a 72 year old male with a history of tobacco abuse, COPD, coronary artery disease, type 2 diabetes, peripheral neuropathy, obstructive sleep apnea, hypertension, and hyperlipidemia.  He developed a cough around Thanksgiving and was found to have a possible right lower lobe mass that was confirmed by CT, and on PET-CT, the mass was hypermetabolic with an SUV of 5.4.  There was no evidence of regional or distant metastases.  I did bronchoscopy with endobronchial ultrasound and the mass turned out to be an adenocarcinoma.  Level 4R and 7 nodes were negative for tumor on aspirations.  Treatment options including surgery, radiation, and chemotherapy were discussed with the patient.  He strongly favored a surgical approach despite being high risk due to his comorbidities.  The indications, risks, benefits, and alternatives were discussed in detail with the patient.  He understood and accepted the risks and agreed  to proceed.  OPERATIVE NOTE:  Mr. Riegler was brought to the preoperative holding area on January 17, 2018.  Anesthesia placed a central line and an arterial blood pressure monitoring line.  He was taken to the operating room, anesthetized, and intubated with a double-lumen endotracheal tube. Intravenous antibiotics were administered.  Sequential compression devices were placed on the calves for DVT prophylaxis.  A Foley catheter was placed.  He was placed in the left lateral decubitus position and the right chest was prepped and draped in usual sterile fashion.  Single lung ventilation of the left lung was initiated and it was tolerated well throughout the procedure.  After performing a time-out, an incision was made in the seventh interspace in the midaxillary line.  A 5-mm port was inserted into the chest and a thoracoscope was advanced into the chest.  There was good isolation of the right lung, but it was very slow to deflate.  It did deflate over time with suction applied.  An initial 8-cm skin incision was made in the fourth interspace anterolaterally.  No rib spreading was performed during the procedure.  There was no abnormality of the visceral or parietal pleura and there was no pleural effusion.  Even once deflated, the lung was large and difficult to maneuver.  The inferior ligament was  divided with electrocautery.  All lymph nodes that were encountered during the dissection were sent as separate specimens for pathology.  The pleural reflection was divided at the hilum posteriorly and level 7 nodes were removed and sent for pathology.  The pleura reflection was divided at the hilum anteriorly.  The initial approach was going to be to attempt a right lower lobectomy and spare the middle lobe.  The major fissure between the lower and middle lobes was entered.  The pleura overlying the vessels was incised.  There was an enlarged node at the bifurcation of the middle lobe artery.   This node was dissected out and biopsied and sent for frozen section.  It returned with no tumor seen.  The dissection became difficult in this area due to the adhesions and inflammation.  Inspecting more anteriorly, there also were enlarged nodes around the middle lobe pulmonary veins, so it was not felt that it would be safe to spare the middle lobe due to concern for surgical margin.  The inferior pulmonary vein was dissected out and divided with the endoscopic vascular stapler.  Next, the right middle lobe pulmonary vein branches were encircled and divided with the vascular stapler as well.  The minor fissure was incomplete and it was completed over time with sequential firings of Echelon 60-mm stapler using both gold and green cartridges.  This dissection was facilitated once the right middle lobe pulmonary branches and the posterior upper lobe vein branch were identified and the dissection and stapling was carried along this upper lobe pulmonary vein.  The posterior ascending branch of the right upper lobe was identified and preserved.  The major fissure between the superior segment of the lower lobe and the upper lobe then was completed again with sequential firings of the stapler. This also was a slow and tedious dissection due to poor tissue planes and the incision was extended at this point to allow better instrumentation, but still no rib spreading was performed.  The pulmonary artery was identified in the fissure.  The decision was made to take the lower lobe pulmonary artery separately for dividing the main pulmonary artery to allow an increased margin of safety.  The anterior basilar segmental vessel was divided separately with the endoscopic vascular stapler.  Then, the superior segmental branch was divided and finally the remainder of the basilar segmental branches were divided as a group, all with endoscopic stapler.  This then allowed dissection behind the pulmonary  artery.  As a node was being removed along the main pulmonary artery near the takeoff of the posterior ascending branch to the upper lobe, bleeding occurred.  This was controlled with a vascular clamp.  Additional portion of the main pulmonary artery then was dissected out more proximally and stapled with a vascular stapler and then there was good hemostasis.  The upper lobe bronchus and bronchus intermedius bifurcation now was apparent.  A stapler was placed across the bronchus intermedius just distal to the takeoff of the upper lobe bronchus.  A test inflation showed good aeration of the upper lobe and no ventilation of the lower and middle lobes.  The stapler was fired transecting the bronchus.  Due to the size of the specimen, I was unable to get both the lower and middle lobes in the endoscopic retrieval bag and remove from the chest.  The right middle lobe bronchus was divided with a stapler and the middle lobe was removed separately.  The lower lobe then was placed into an endoscopic  retrieval bag and removed through the incision.  The specimen was sent for frozen section on the bronchial margin, which returned with no tumor seen.  The chest was copiously irrigated with warm saline.  A test inflation to 30 cm of water revealed no evidence of air leakage.  An On-Q local anesthetic catheter was placed through a separate stab incision posteriorly. Initial attempts were made to tunnel this subpleurally, but multiple tears occurred in the pleura.  I was unable angulate the catheter for successful placement in that location.  The catheter was tunneled extrapleurally along the ribcage and primed with 10 mL of 0.5% Marcaine. A 28-French Blake drain was placed through the original port incision. A 28-French chest tube was placed through another small incision anterior to that.  The upper lobe was reinflated.  The working incision was closed in standard fashion with a #1 Vicryl fascial  closure followed by 2-0 Vicryl subcutaneous and 3-0 Vicryl subcuticular sutures.  The chest tubes were placed to suction.  The patient was placed back in a supine position. He was extubated in the operating room and taken to the postanesthetic care unit in good condition.     Revonda Standard Roxan Hockey, M.D.     SCH/MEDQ  D:  01/17/2018  T:  01/17/2018  Job:  [id number]

## 2018-01-17 NOTE — Progress Notes (Signed)
Patient ID: Christian [name], male   DOB: Jul 11, 1946, 72 y.o.   MRN: [id number] EVENING ROUNDS NOTE :     [address]       [place], [address]             [phone]                 1 Day Post-Op Procedure(s) (LRB): VIDEO ASSISTED THORACOSCOPY (VATS)/RIGHT MIDDLE AND LOWER LOBE LUNG LOBECTOMY WITH NODE BIOPSIES x6 (Right)  Total Length of Stay:  LOS: 1 day  BP 120/61   Pulse 76   Temp 98.4 F (36.9 C) (Oral)   Resp (!) 23   Wt 270 lb (122.5 kg)   SpO2 98%   BMI 36.62 kg/m   .Intake/Output      02/09 0701 - 02/10 0700   P.O. 1080   I.V. (mL/kg) 554.5 (4.5)   Other    IV Piggyback    Total Intake(mL/kg) 1634.5 (13.3)   Urine (mL/kg/hr) 850 (0.6)   Blood    Chest Tube 380   Total Output 1230   Net +404.5         . bupivacaine ON-Q pain pump    . dextrose 5 % and 0.9% NaCl 10 mL/hr (01/17/18 1203)  . potassium chloride       Lab Results  Component Value Date   WBC 14.2 (H) 01/17/2018   HGB 12.4 (L) 01/17/2018   HCT 37.9 (L) 01/17/2018   PLT 141 (L) 01/17/2018   GLUCOSE 175 (H) 01/17/2018   CHOL 97 07/30/2017   TRIG 60.0 07/30/2017   HDL 31.80 (L) 07/30/2017   LDLCALC 53 07/30/2017   ALT 20 01/14/2018   AST 22 01/14/2018   NA 136 01/17/2018   K 4.4 01/17/2018   CL 105 01/17/2018   CREATININE 0.91 01/17/2018   BUN 13 01/17/2018   CO2 22 01/17/2018   PSA 1.63 07/30/2017   INR 1.10 01/14/2018   HGBA1C 6.1 (H) 12/15/2017   mildy confused  Not using pca pump not connected now  Low dose nicotine patch  Grace Isaac MD  Beeper [phone] Office [phone] 01/17/2018 7:14 PM

## 2018-01-17 NOTE — Progress Notes (Signed)
1 Day Post-Op Procedure(s) (LRB): VIDEO ASSISTED THORACOSCOPY (VATS)/RIGHT MIDDLE AND LOWER LOBE LUNG LOBECTOMY WITH NODE BIOPSIES x6 (Right) Subjective: Minimal pain Pulmonary status stable Air leak with cough Objective: Vital signs in last 24 hours: Temp:  [97 F (36.1 C)-98.2 F (36.8 C)] 98.2 F (36.8 C) (02/09 0733) Pulse Rate:  [70-103] 70 (02/09 0600) Cardiac Rhythm: Normal sinus rhythm (02/09 0400) Resp:  [8-33] 16 (02/09 0737) BP: (93-146)/(56-110) 141/70 (02/09 0959) SpO2:  [90 %-100 %] 99 % (02/09 0737) Arterial Line BP: (112-173)/(47-79) 152/57 (02/09 0600) Weight:  [270 lb (122.5 kg)] 270 lb (122.5 kg) (02/09 0500)  Hemodynamic parameters for last 24 hours:  stable  Intake/Output from previous day: 02/08 0701 - 02/09 0700 In: 5228.3 [I.V.:4328.3; IV Piggyback:800] Out: 4365 [Urine:2110; Blood:1325; Chest Tube:930] Intake/Output this shift: No intake/output data recorded.  Coarse breath sounds on right nsr Lab Results: Recent Labs    01/14/18 1151  01/16/18 1411 01/17/18 0349  WBC 8.2  --   --  14.2*  HGB 14.2   < > 11.9* 12.4*  HCT 42.8   < > 35.0* 37.9*  PLT 162  --   --  141*   < > = values in this interval not displayed.   BMET:  Recent Labs    01/14/18 1151  01/16/18 1324 01/16/18 1411 01/17/18 0349  NA 137   < > 140 139 136  K 4.4   < > 4.7 5.1 4.4  CL 108  --   --   --  105  CO2 19*  --   --   --  22  GLUCOSE 157*  --  159*  --  175*  BUN 19  --   --   --  13  CREATININE 1.18  --   --   --  0.91  CALCIUM 9.1  --   --   --  8.2*   < > = values in this interval not displayed.    PT/INR:  Recent Labs    01/14/18 1151  LABPROT 14.1  INR 1.10   ABG    Component Value Date/Time   PHART 7.336 (L) 01/17/2018 0405   HCO3 23.5 01/17/2018 0405   TCO2 25 01/16/2018 1411   ACIDBASEDEF 1.5 01/17/2018 0405   O2SAT 98.6 01/17/2018 0405   CBG (last 3)  Recent Labs    01/16/18 0612 01/16/18 1526  GLUCAP 137* 174*     Assessment/Plan: S/P Procedure(s) (LRB): VIDEO ASSISTED THORACOSCOPY (VATS)/RIGHT MIDDLE AND LOWER LOBE LUNG LOBECTOMY WITH NODE BIOPSIES x6 (Right) Mobilize leave chest tubes to suction   LOS: 1 day    Tharon Aquas Trigt III 01/17/2018

## 2018-01-18 ENCOUNTER — Inpatient Hospital Stay (HOSPITAL_COMMUNITY): Payer: PPO

## 2018-01-18 LAB — COMPREHENSIVE METABOLIC PANEL
ALT: 40 U/L (ref 17–63)
AST: 127 U/L — ABNORMAL HIGH (ref 15–41)
Albumin: 3.2 g/dL — ABNORMAL LOW (ref 3.5–5.0)
Alkaline Phosphatase: 44 U/L (ref 38–126)
Anion gap: 8 (ref 5–15)
BUN: 15 mg/dL (ref 6–20)
CO2: 25 mmol/L (ref 22–32)
Calcium: 8.5 mg/dL — ABNORMAL LOW (ref 8.9–10.3)
Chloride: 103 mmol/L (ref 101–111)
Creatinine, Ser: 0.95 mg/dL (ref 0.61–1.24)
GFR calc Af Amer: >60 mL/min (ref >60)
GFR calc non Af Amer: >60 mL/min (ref >60)
Glucose, Bld: 128 mg/dL — ABNORMAL HIGH (ref 65–99)
Potassium: 4.2 mmol/L (ref 3.5–5.1)
Sodium: 136 mmol/L (ref 135–145)
Total Bilirubin: 0.9 mg/dL (ref 0.3–1.2)
Total Protein: 5.7 g/dL — ABNORMAL LOW (ref 6.5–8.1)

## 2018-01-18 LAB — CBC
HCT: 34.3 % — ABNORMAL LOW (ref 39.0–52.0)
Hemoglobin: 11.3 g/dL — ABNORMAL LOW (ref 13.0–17.0)
MCH: 34.7 pg — ABNORMAL HIGH (ref 26.0–34.0)
MCHC: 32.9 g/dL (ref 30.0–36.0)
MCV: 105.2 fL — ABNORMAL HIGH (ref 78.0–100.0)
Platelets: 123 10*3/uL — ABNORMAL LOW (ref 150–400)
RBC: 3.26 MIL/uL — ABNORMAL LOW (ref 4.22–5.81)
RDW: 13.8 % (ref 11.5–15.5)
WBC: 13.7 10*3/uL — ABNORMAL HIGH (ref 4.0–10.5)

## 2018-01-18 MED ORDER — FUROSEMIDE 10 MG/ML IJ SOLN
40.0000 mg | Freq: Once | INTRAMUSCULAR | Status: AC
  Administered 2018-01-18: 40 mg via INTRAVENOUS
  Filled 2018-01-18: qty 4

## 2018-01-18 NOTE — Progress Notes (Signed)
Wasted 23 mL of Fent with Sula Soda, RN.

## 2018-01-18 NOTE — Progress Notes (Signed)
Patient ID: Christian [name], male   DOB: 1945-12-23, 72 y.o.   MRN: [id number] EVENING ROUNDS NOTE :     [address]       [place] [address]             [phone]                 2 Days Post-Op Procedure(s) (LRB): VIDEO ASSISTED THORACOSCOPY (VATS)/RIGHT MIDDLE AND LOWER LOBE LUNG LOBECTOMY WITH NODE BIOPSIES x6 (Right)  Total Length of Stay:  LOS: 2 days  BP 103/69   Pulse 79   Temp 98.7 F (37.1 C) (Oral)   Resp 19   Wt 271 lb 9.7 oz (123.2 kg)   SpO2 99%   BMI 36.84 kg/m   .Intake/Output      02/10 0701 - 02/11 0700   P.O. 840   I.V. (mL/kg)    Total Intake(mL/kg) 840 (6.8)   Urine (mL/kg/hr) 1320 (0.9)   Chest Tube 200   Total Output 1520   Net -680         . bupivacaine ON-Q pain pump    . dextrose 5 % and 0.9% NaCl 10 mL/hr (01/17/18 1203)  . potassium chloride       Lab Results  Component Value Date   WBC 13.7 (H) 01/18/2018   HGB 11.3 (L) 01/18/2018   HCT 34.3 (L) 01/18/2018   PLT 123 (L) 01/18/2018   GLUCOSE 128 (H) 01/18/2018   CHOL 97 07/30/2017   TRIG 60.0 07/30/2017   HDL 31.80 (L) 07/30/2017   LDLCALC 53 07/30/2017   ALT 40 01/18/2018   AST 127 (H) 01/18/2018   NA 136 01/18/2018   K 4.2 01/18/2018   CL 103 01/18/2018   CREATININE 0.95 01/18/2018   BUN 15 01/18/2018   CO2 25 01/18/2018   PSA 1.63 07/30/2017   INR 1.10 01/14/2018   HGBA1C 6.1 (H) 12/15/2017   Patient is much more cooperative today, less agitated No air leak from chest tube   Grace Isaac MD  Beeper [phone] Office [phone] 01/18/2018 7:33 PM

## 2018-01-18 NOTE — Progress Notes (Signed)
PCA pump discontinued and syringe removed from patient's room.  Wasted 23 cc fentanyl from syringe.  Witnessed by Virgie Dad, RN.  Bobette Mo

## 2018-01-18 NOTE — Progress Notes (Signed)
Patient ID: Christian [name], male   DOB: 14-Dec-1945, 72 y.o.   MRN: [id number] [facility] DAILY ICU PROGRESS NOTE                   [address]            [place] [address]          [phone]   2 Days Post-Op Procedure(s) (LRB): VIDEO ASSISTED THORACOSCOPY (VATS)/RIGHT MIDDLE AND LOWER LOBE LUNG LOBECTOMY WITH NODE BIOPSIES x6 (Right)  Total Length of Stay:  LOS: 2 days   Subjective: Patient is awake alert neurologically intact, much less fidgety than yesterday. Walked around the unit this morning  Objective: Vital signs in last 24 hours: Temp:  [98 F (36.7 C)-98.8 F (37.1 C)] 98.8 F (37.1 C) (02/10 0734) Pulse Rate:  [66-85] 73 (02/10 0700) Cardiac Rhythm: Normal sinus rhythm (02/10 0400) Resp:  [11-28] 18 (02/10 0700) BP: (108-143)/(41-88) 108/54 (02/10 0700) SpO2:  [90 %-100 %] 96 % (02/10 0722) Arterial Line BP: (132-192)/(52-69) 141/52 (02/09 1400) Weight:  [271 lb 9.7 oz (123.2 kg)] 271 lb 9.7 oz (123.2 kg) (02/10 0500)  Filed Weights   01/16/18 0549 01/17/18 0500 01/18/18 0500  Weight: 266 lb (120.7 kg) 270 lb (122.5 kg) 271 lb 9.7 oz (123.2 kg)    Weight change: 1 lb 9.7 oz (0.729 kg)   Hemodynamic parameters for last 24 hours:    Intake/Output from previous day: 02/09 0701 - 02/10 0700 In: 1754.5 [P.O.:1200; I.V.:554.5] Out: 2270 [Urine:1500; Chest Tube:770]  Intake/Output this shift: No intake/output data recorded.  Current Meds: Scheduled Meds: . acetaminophen  1,000 mg Oral Q6H   Or  . acetaminophen (TYLENOL) oral liquid 160 mg/5 mL  1,000 mg Oral Q6H  . aspirin  325 mg Oral Daily  . bisacodyl  10 mg Oral Daily  . clonazePAM  0.5 mg Oral BID  . enoxaparin (LOVENOX) injection  40 mg Subcutaneous Daily  . levalbuterol  0.63 mg Nebulization TID  . lisinopril  10 mg Oral Daily  . mouth rinse  15 mL Mouth Rinse BID  . metoprolol succinate  50 mg Oral Daily  . nicotine  14 mg Transdermal Daily  . senna-docusate  1 tablet Oral QHS    . simvastatin  40 mg Oral QHS   Continuous Infusions: . bupivacaine ON-Q pain pump    . dextrose 5 % and 0.9% NaCl 10 mL/hr (01/17/18 1203)  . potassium chloride     PRN Meds:.albuterol, baclofen, Influenza vac split quadrivalent PF, oxyCODONE, potassium chloride, traMADol  General appearance: alert, cooperative and no distress Neurologic: intact Heart: regular rate and rhythm, S1, S2 normal, no murmur, click, rub or gallop Lungs: diminished breath sounds bibasilar Abdomen: soft, non-tender; bowel sounds normal; no masses,  no organomegaly Extremities: extremities normal, atraumatic, no cyanosis or edema and Homans sign is negative, no sign of DVT Wound: No air leak this morning still 700 cc from the chest tube overnight  Lab Results: CBC: Recent Labs    01/17/18 0349 01/18/18 0345  WBC 14.2* 13.7*  HGB 12.4* 11.3*  HCT 37.9* 34.3*  PLT 141* 123*   BMET:  Recent Labs    01/17/18 0349 01/18/18 0345  NA 136 136  K 4.4 4.2  CL 105 103  CO2 22 25  GLUCOSE 175* 128*  BUN 13 15  CREATININE 0.91 0.95  CALCIUM 8.2* 8.5*    CMET: Lab Results  Component Value Date   WBC 13.7 (H)  01/18/2018   HGB 11.3 (L) 01/18/2018   HCT 34.3 (L) 01/18/2018   PLT 123 (L) 01/18/2018   GLUCOSE 128 (H) 01/18/2018   CHOL 97 07/30/2017   TRIG 60.0 07/30/2017   HDL 31.80 (L) 07/30/2017   LDLCALC 53 07/30/2017   ALT 40 01/18/2018   AST 127 (H) 01/18/2018   NA 136 01/18/2018   K 4.2 01/18/2018   CL 103 01/18/2018   CREATININE 0.95 01/18/2018   BUN 15 01/18/2018   CO2 25 01/18/2018   PSA 1.63 07/30/2017   INR 1.10 01/14/2018   HGBA1C 6.1 (H) 12/15/2017      PT/INR: No results for input(s): LABPROT, INR in the last 72 hours. Radiology: Dg Chest Port 1 View  Result Date: 01/18/2018 CLINICAL DATA:  Status post VATS, right middle and lower lobectomies. EXAM: PORTABLE CHEST 1 VIEW COMPARISON:  01/17/2018 and prior radiographs FINDINGS: Cardiomegaly, right thoracic surgical changes,  right IJ central venous catheter with tip overlying the lower SVC, and 2 right thoracostomy tubes are again noted. There is no evidence of pneumothorax. Mild right basilar atelectasis/airspace disease again noted. There has been little interval change since the prior study. IMPRESSION: Unchanged appearance of the chest with support apparatus as described and mild right basilar atelectasis/airspace disease. Electronically Signed   By: Margarette Canada M.D.   On: 01/18/2018 06:48     Assessment/Plan: S/P Procedure(s) (LRB): VIDEO ASSISTED THORACOSCOPY (VATS)/RIGHT MIDDLE AND LOWER LOBE LUNG LOBECTOMY WITH NODE BIOPSIES x6 (Right) Mobilize Diuresis Leave chest tubes for now Continue pulmonary toilet As nicotine patch in place Renal function stable  Grace Isaac 01/18/2018 7:54 AM

## 2018-01-19 ENCOUNTER — Inpatient Hospital Stay (HOSPITAL_COMMUNITY): Payer: PPO

## 2018-01-19 LAB — TYPE AND SCREEN
ABO/RH(D): A POS
Antibody Screen: NEGATIVE
Unit division: 0
Unit division: 0

## 2018-01-19 LAB — BASIC METABOLIC PANEL
Anion gap: 10 (ref 5–15)
BUN: 15 mg/dL (ref 6–20)
CO2: 24 mmol/L (ref 22–32)
Calcium: 8.3 mg/dL — ABNORMAL LOW (ref 8.9–10.3)
Chloride: 103 mmol/L (ref 101–111)
Creatinine, Ser: 1 mg/dL (ref 0.61–1.24)
GFR calc Af Amer: >60 mL/min (ref >60)
GFR calc non Af Amer: >60 mL/min (ref >60)
Glucose, Bld: 144 mg/dL — ABNORMAL HIGH (ref 65–99)
Potassium: 3.9 mmol/L (ref 3.5–5.1)
Sodium: 137 mmol/L (ref 135–145)

## 2018-01-19 LAB — BPAM RBC
Blood Product Expiration Date: 201903042359
Blood Product Expiration Date: 201903052359
ISSUE DATE / TIME: 201902080823
ISSUE DATE / TIME: 201902080823
Unit Type and Rh: 6200
Unit Type and Rh: 6200

## 2018-01-19 LAB — CBC
HCT: 33.1 % — ABNORMAL LOW (ref 39.0–52.0)
Hemoglobin: 10.8 g/dL — ABNORMAL LOW (ref 13.0–17.0)
MCH: 34.3 pg — ABNORMAL HIGH (ref 26.0–34.0)
MCHC: 32.6 g/dL (ref 30.0–36.0)
MCV: 105.1 fL — ABNORMAL HIGH (ref 78.0–100.0)
Platelets: 138 10*3/uL — ABNORMAL LOW (ref 150–400)
RBC: 3.15 MIL/uL — ABNORMAL LOW (ref 4.22–5.81)
RDW: 14.1 % (ref 11.5–15.5)
WBC: 13 10*3/uL — ABNORMAL HIGH (ref 4.0–10.5)

## 2018-01-19 MED ORDER — NITROGLYCERIN 0.4 MG SL SUBL: 0.4000 mg | SUBLINGUAL_TABLET | SUBLINGUAL | Status: DC | PRN

## 2018-01-19 MED ORDER — GUAIFENESIN ER 600 MG PO TB12
1200.0000 mg | ORAL_TABLET | Freq: Two times a day (BID) | ORAL | Status: DC
  Administered 2018-01-19 – 2018-01-21 (×5): 1200 mg via ORAL
  Filled 2018-01-19 (×5): qty 2

## 2018-01-19 MED ORDER — POLYETHYLENE GLYCOL 3350 17 G PO PACK
17.0000 g | PACK | Freq: Once | ORAL | Status: AC
  Administered 2018-01-19: 17 g via ORAL
  Filled 2018-01-19: qty 1

## 2018-01-19 NOTE — Care Management Note (Signed)
Case Management Note  Patient Details  Name: Christian [name] MRN: [id number] Date of Birth: [birth year]  Subjective/Objective: from home alone, he used a cane pta.  POD 3 VATS, lobectomy, conts with chest tube to waterseal.  He states he will be going to sons house and his daughter will come over to assist him while he is there when he is discharged.  He states he has a PCP and medication coverage.                    Action/Plan: NCM will follow for dc needs.   Expected Discharge Date:                  Expected Discharge Plan:  Home/Self Care  In-House Referral:     Discharge planning Services  CM Consult  Post Acute Care Choice:    Choice offered to:     DME Arranged:    DME Agency:     HH Arranged:    HH Agency:     Status of Service:  In process, will continue to follow  If discussed at Long Length of Stay Meetings, dates discussed:    Additional Comments:  Zenon Mayo, RN 01/19/2018, 10:41 AM

## 2018-01-19 NOTE — Progress Notes (Signed)
[facility] BRIEF SICU PROGRESS NOTE  3 Days Post-Op  S/P Procedure(s) (LRB): VIDEO ASSISTED THORACOSCOPY (VATS)/RIGHT MIDDLE AND LOWER LOBE LUNG LOBECTOMY WITH NODE BIOPSIES x6 (Right)   Stable day  Plan: Awaiting bed for transfer  Rexene Alberts, MD 01/19/2018 5:57 PM

## 2018-01-19 NOTE — Progress Notes (Signed)
3 Days Post-Op Procedure(s) (LRB): VIDEO ASSISTED THORACOSCOPY (VATS)/RIGHT MIDDLE AND LOWER LOBE LUNG LOBECTOMY WITH NODE BIOPSIES x6 (Right) Subjective: Some soreness, has not had BM yet  Objective: Vital signs in last 24 hours: Temp:  [98 F (36.7 C)-99.7 F (37.6 C)] 98.4 F (36.9 C) (02/11 0300) Pulse Rate:  [67-86] 67 (02/11 0600) Cardiac Rhythm: Normal sinus rhythm (02/11 0600) Resp:  [16-25] 18 (02/11 0600) BP: (92-131)/(45-74) 103/67 (02/11 0600) SpO2:  [92 %-99 %] 96 % (02/11 0600) Weight:  [267 lb 3.2 oz (121.2 kg)] 267 lb 3.2 oz (121.2 kg) (02/11 0500)  Hemodynamic parameters for last 24 hours:    Intake/Output from previous day: 02/10 0701 - 02/11 0700 In: 840 [P.O.:840] Out: 2260 [Urine:1820; Chest Tube:440] Intake/Output this shift: No intake/output data recorded.  General appearance: alert, cooperative and no distress Neurologic: intact Heart: regular rate and rhythm Lungs: diminished breath sounds on right Abdomen: normal findings: soft, non-tender minimal air leak, serous drainage from CT  Lab Results: Recent Labs    01/18/18 0345 01/19/18 0340  WBC 13.7* 13.0*  HGB 11.3* 10.8*  HCT 34.3* 33.1*  PLT 123* 138*   BMET:  Recent Labs    01/18/18 0345 01/19/18 0340  NA 136 137  K 4.2 3.9  CL 103 103  CO2 25 24  GLUCOSE 128* 144*  BUN 15 15  CREATININE 0.95 1.00  CALCIUM 8.5* 8.3*    PT/INR: No results for input(s): LABPROT, INR in the last 72 hours. ABG    Component Value Date/Time   PHART 7.336 (L) 01/17/2018 0405   HCO3 23.5 01/17/2018 0405   TCO2 25 01/16/2018 1411   ACIDBASEDEF 1.5 01/17/2018 0405   O2SAT 98.6 01/17/2018 0405   CBG (last 3)  Recent Labs    01/16/18 1526  GLUCAP 174*    Assessment/Plan: S/P Procedure(s) (LRB): VIDEO ASSISTED THORACOSCOPY (VATS)/RIGHT MIDDLE AND LOWER LOBE LUNG LOBECTOMY WITH NODE BIOPSIES x6 (Right) Plan for transfer to step-down: see transfer orders  POD # 3 R lower and middle  bilobectomy Air leak minimal and drainage trending down- dc posterior CT and place anterior tube to water seal Increase ambulation IS + flutter valve Will give mucinex to help with clearing mucous Enoxaparin + SCD for DVT prophylaxis   LOS: 3 days    Christian [name] 01/19/2018

## 2018-01-20 ENCOUNTER — Inpatient Hospital Stay (HOSPITAL_COMMUNITY): Payer: PPO

## 2018-01-20 MED ORDER — LACTULOSE 10 GM/15ML PO SOLN
20.0000 g | Freq: Once | ORAL | Status: AC
  Administered 2018-01-20: 20 g via ORAL
  Filled 2018-01-20: qty 30

## 2018-01-20 MED ORDER — POLYETHYLENE GLYCOL 3350 17 G PO PACK
17.0000 g | PACK | Freq: Every day | ORAL | Status: DC
  Administered 2018-01-20: 17 g via ORAL
  Filled 2018-01-20: qty 1

## 2018-01-20 NOTE — Discharge Instructions (Signed)
Thoracoscopy, Care After °Refer to this sheet in the next few weeks. These instructions provide you with information about caring for yourself after your procedure. Your health care provider may also give you more specific instructions. Your treatment has been planned according to current medical practices, but problems sometimes occur. Call your health care provider if you have any problems or questions after your procedure. °What can I expect after the procedure? °After your procedure, it is common to feel sore for up to two weeks. °Follow these instructions at home: °· There are many different ways to close and cover an incision, including stitches (sutures), skin glue, and adhesive strips. Follow your health care provider's instructions about: °? Incision care. °? Bandage (dressing) changes and removal. °? Incision closure removal. °· Check your incision area every day for signs of infection. Watch for: °? Redness, swelling, or pain. °? Fluid, blood, or pus. °· Take medicines only as directed by your health care provider. °· Try to cough often. Coughing helps to protect against lung infection (pneumonia). It may hurt to cough. If this happens, hold a pillow against your chest when you cough. °· Take deep breaths. This also helps to protect against pneumonia. °· If you were given an incentive spirometer, use it as directed by your health care provider. °· Do not take baths, swim, or use a hot tub until your health care provider approves. You may take showers. °· Avoid lifting until your health care provider approves. °· Avoid driving until your health care provider approves. °· Do not travel by airplane after the chest tube is removed until your health care provider approves. °Contact a health care provider if: °· You have a fever. °· Pain medicines do not ease your pain. °· You have redness, swelling, or increasing pain in your incision area. °· You develop a cough that does not go away, or you are coughing up  mucus that is yellow or green. °Get help right away if: °· You have fluid, blood, or pus coming from your incision. °· There is a bad smell coming from your incision or dressing. °· You develop a rash. °· You have difficulty breathing. °· You cough up blood. °· You develop light-headedness or you feel faint. °· You develop chest pain. °· Your heartbeat feels irregular or very fast. °This information is not intended to replace advice given to you by your health care provider. Make sure you discuss any questions you have with your health care provider. °Document Released: 06/14/2005 Document Revised: 07/28/2016 Document Reviewed: 08/10/2014 °Elsevier Interactive Patient Education © 2018 [employer] ° °

## 2018-01-20 NOTE — Discharge Summary (Addendum)
Physician Discharge Summary       [address]       [place] [address]             [phone]    Patient ID: Christian [name] MRN: [id number] DOB/AGE: 70-Sep-1947 70 y.o.  Admit date: 01/16/2018 Discharge date: 01/21/2018  Admission Diagnoses: Adenocarcinoma RLL  Discharge Diagnoses:  Adenocarcinoma RLL 1. S/P lobectomy of lung 2. History of skin cancer (Christian [name]) 3. History of GERD 4. History of shingles 5. History of hyperlipidemia 6. History of hypertension 7. History of OSA (on CPAP) 8, History of diabetes mellitus type 2 with complications (Christian [name]) 9. History of anxiety 10. History of arthritis   Procedure (s):  Right video-assisted thoracoscopy, right lower and middle bilobectomy, lymph node dissection, and insertion of On-Q local anesthetic catheter by Dr. Roxan [name].  Pathology: 1. Lymph node, biopsy, 12 - THERE IS NO EVIDENCE OF CARCINOMA IN 1 OF 1 LYMPH NODE (0/1). 2. Lung, resection (segmental or lobe), Right Middle and Right Lower Lobe - INVASIVE ADENOCARCINOMA, MODERATELY DIFFERENTIATED, SPANNING 3.9 CM. - THE SURGICAL RESECTION MARGINS ARE NEGATIVE FOR CARCINOMA. - THERE IS NO EVIDENCE OF CARCINOMA IN 1 OF 1 LYMPH NODE (0/1). - SEE ONCOLOGY TABLE BELOW. 3. Lymph node, biopsy, 7 - THERE IS NO EVIDENCE OF CARCINOMA IN 1 OF 1 LYMPH NODE (0/1)> 4. Lymph node, biopsy, 10 - THERE IS NO EVIDENCE OF CARCINOMA IN 1 OF 1 LYMPH NODE (0/1)> 5. Lymph node, biopsy, 12 #2 - THERE IS NO EVIDENCE OF CARCINOMA IN 1 OF 1 LYMPH NODE (0/1)> 6. Lymph node, biopsy, 11 - THERE IS NO EVIDENCE OF CARCINOMA IN 1 OF 1 LYMPH NODE (0/1)> 7. Lymph node, biopsy, 7 #2 - THERE IS NO EVIDENCE OF CARCINOMA IN 1 OF 1 LYMPH NODE (0/1)> 8. Soft tissue, biopsy, right lung final vascular margin - BENIGN VASCULAR STRUCTURE. - THERE IS NO EVIDENCE OF MALIGNANCY. TNM code: pT2a, pN0.  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  He developed a cough around Thanksgiving.  He thought he had the flu.  He was treated initially with antibiotics within a chest x-ray was done.  It showed possible right lower lobe mass.  A CT of the chest showed a 5 x 2 cm right lower lobe mass.  There was no hilar or mediastinal adenopathy.  On PET CT the mass was hypermetabolic with an SUV of 5.4.  There was no evidence of regional or distant metastases.  Dr. Roxan [name] did a bronchoscopy and endobronchial ultrasound on 12/15/2017.  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  His 4R and 7 nodes were negative for tumor.  He tolerated the procedure well without any complications.  He saw Dr. Earlie [name] for clearance.  Stress test was done.  He says he was told it is "okay."  He says he has cut his smoking down to half a pack of cigarettes daily   Brief Hospital Course:  The patient remained afebrile and hemodynamically stable. A line and foley were removed early in the post operative course. Chest tube output gradually decreased. Daily chest x rays were obtained and remained stable. All chest tubes were removed by 01/21/2016. Patient is ambulating on room air. Patient is tolerating a diet and has had a bowel movement. Wounds are clean and dry. Final chest X ray showed tiny, stable right  apical pneumothorax and minor subcutaneous emphysema right lateral chest wall. He is ambulating on room air. He has been tolerating a diet and had a bowel movement yesterday. Wound is clean and dry. Patient is felt surgically stable for discharge today.   Latest Vital Signs: Blood pressure 135/71, pulse 67, temperature 98.1 F (36.7 C), temperature source Oral, resp. rate 18, height 6' (1.829 m), weight 269 lb 6.4 oz (122.2 kg), SpO2 98 %.  Physical Exam: Cardiovascular: RRR Pulmonary: Clear to auscultation on left and diminished on  right Abdomen: Soft, non tender, bowel sounds present. Extremities: Trace bilateral lower extremity edema. Wounds: Clean and dry.  No erythema or signs of infection.   Discharge Condition: Stable and discharged to home.  Recent laboratory studies:  Lab Results  Component Value Date   WBC 13.0 (H) 01/19/2018   HGB 10.8 (L) 01/19/2018   HCT 33.1 (L) 01/19/2018   MCV 105.1 (H) 01/19/2018   PLT 138 (L) 01/19/2018   Lab Results  Component Value Date   NA 137 01/19/2018   K 3.9 01/19/2018   CL 103 01/19/2018   CO2 24 01/19/2018   CREATININE 1.00 01/19/2018   GLUCOSE 144 (H) 01/19/2018     Diagnostic Studies: Dg Chest 2 View  Result Date: 01/21/2018 CLINICAL DATA:  Status post right middle and lower lobectomies. EXAM: CHEST  2 VIEW COMPARISON:  01/20/2018 FINDINGS: Right jugular catheter terminates over the mid to lower SVC, unchanged. The cardiomediastinal silhouette is unchanged. Postoperative changes are again noted with right hemithorax volume loss. Patchy right basilar lung opacity and small right pleural effusion have not significantly changed. No left lung consolidation is seen. There is a tiny right apical pneumothorax. IMPRESSION: 1. Tiny right apical pneumothorax. 2. Unchanged right basilar atelectasis and small right pleural effusion. Electronically Christian [name]   By: Christian [name] M.D.   On: 01/21/2018 07:54   Mr Christian [name] PP Contrast  Result Date: 01/01/2018 CLINICAL DATA:  Lung cancer staging. EXAM: MRI HEAD WITHOUT AND WITH CONTRAST TECHNIQUE: Multiplanar, multiecho pulse sequences of the brain and surrounding structures were obtained without and with intravenous contrast. CONTRAST:  77mL MULTIHANCE GADOBENATE DIMEGLUMINE 529 MG/ML IV SOLN COMPARISON:  None. FINDINGS: Brain: Moderate atrophy. Negative for hydrocephalus. Mild chronic microvascular ischemic change in the white matter and pons. Negative for acute infarct. Negative for hemorrhage or mass. Postcontrast imaging negative.  Negative for metastatic disease to the brain. Vascular: Normal arterial flow void Skull and upper cervical spine: Negative Sinuses/Orbits: Mild mucosal edema paranasal sinuses. Air-fluid level left maxillary sinus. Bilateral cataract Other: None IMPRESSION: Negative for metastatic disease to the brain Atrophy and chronic microvascular ischemia. Sinus mucosal edema with air-fluid level left maxillary sinus. Electronically Christian [name]   By: Franchot Gallo M.D.   On: 01/01/2018 12:38   Dg Chest Port 1 View  Result Date: 01/20/2018 CLINICAL DATA:  Chest tube removal. Right middle and lower lobectomies. EXAM: PORTABLE CHEST 1 VIEW 11:10 a.m. COMPARISON:  Radiographs dated 01/20/2018 at 6:18 a.m., 01/19/2018, 01/18/2017 and 01/17/2017 FINDINGS: Right-sided chest tube has been removed. No discrete pneumothorax. Improved aeration at the right lung base with slight residual atelectasis and probable small right effusion. Left lung is clear.  Heart size and vascularity are normal. Central line remains unchanged. IMPRESSION: 1. No appreciable pneumothorax after chest tube removal. 2. Improved atelectasis and probable small effusion at the right lung base. Electronically Christian [name]   By: Lorriane Shire M.D.   On: 01/20/2018 11:34    Discharge Medications: Allergies as  of 01/21/2018      Reactions   Celebrex [celecoxib] Itching      Medication List    TAKE these medications   albuterol 108 (90 Base) MCG/ACT inhaler Commonly known as:  PROVENTIL HFA;VENTOLIN HFA Inhale 2 puffs into the lungs every 6 (six) hours as needed for wheezing or shortness of breath. What changed:  how much to take   aspirin 325 MG tablet Take 325 mg by mouth daily.   baclofen 20 MG tablet Commonly known as:  LIORESAL Take 1 tablet (20 mg total) by mouth 2 (two) times daily as needed for muscle spasms. What changed:    how much to take  when to take this   clonazePAM 0.5 MG tablet Commonly known as:  KLONOPIN TAKE ONE TABLET BY  MOUTH AT BEDTIME AS NEEDED FOR ANXIETY OR INSOMNIA What changed:    how much to take  how to take this  when to take this  additional instructions   guaiFENesin 600 MG 12 hr tablet Commonly known as:  MUCINEX Take 2 tablets (1,200 mg total) by mouth 2 (two) times daily as needed for cough or to loosen phlegm.   ketoconazole 2 % cream Commonly known as:  NIZORAL Apply 1 application topically 2 (two) times daily as needed for irritation.   lisinopril 10 MG tablet Commonly known as:  PRINIVIL,ZESTRIL Take 1 tablet (10 mg total) by mouth daily.   MELATONIN + L-THEANINE PO Take 1 tablet by mouth at bedtime.   metoprolol succinate 50 MG 24 hr tablet Commonly known as:  TOPROL-XL Take 1 tablet (50 mg total) by mouth daily. Take with or immediately following a meal.   nicotine 14 mg/24hr patch Commonly known as:  NICODERM CQ - dosed in mg/24 hours Place 1 patch (14 mg total) onto the skin daily.   nitroGLYCERIN 0.4 MG SL tablet Commonly known as:  NITROSTAT Place 1 tablet (0.4 mg total) under the tongue every 5 (five) minutes as needed for chest pain.   oxyCODONE 5 MG immediate release tablet Commonly known as:  Oxy IR/ROXICODONE Take 5 mg by mouth every 4-6 hours PRN severe pain   simvastatin 40 MG tablet Commonly known as:  ZOCOR Take 1 tablet (40 mg total) by mouth at bedtime.   vitamin E 100 UNIT capsule Take 100 Units by mouth daily.       Follow Up Appointments: Follow-up Information    Melrose Nakayama, MD. Go on 02/03/2018.   Specialty:  Cardiothoracic Surgery Why:  PA/LAT CXR to be taken (at [facility] which is in the same buliding as Dr. Leonarda Salon office) on 02/03/2018 at 9:15 am;Appointment time is at 9:45 am Contact information: [place]  [address] [phone]           Christian [name]: Arnoldo Lenis 01/21/2018, 8:23 AM

## 2018-01-20 NOTE — Care Management Important Message (Signed)
Important Message  Patient Details  Name: Christian [name] MRN: [id number] Date of Birth: 1946-03-22   Medicare Important Message Given:  Yes    Orbie Pyo 01/20/2018, 1:09 PM

## 2018-01-20 NOTE — Progress Notes (Addendum)
      [address]       [place] [address]             [phone]       4 Days Post-Op Procedure(s) (LRB): VIDEO ASSISTED THORACOSCOPY (VATS)/RIGHT MIDDLE AND LOWER LOBE LUNG LOBECTOMY WITH NODE BIOPSIES x6 (Right)  Subjective: Patient eating breakfast on the side of the bed this am. He has not had a bowel movement yet.  Objective: Vital signs in last 24 hours: Temp:  [98.3 F (36.8 C)-99.4 F (37.4 C)] 98.7 F (37.1 C) (02/12 0714) Pulse Rate:  [68-78] 70 (02/12 0756) Cardiac Rhythm: Normal sinus rhythm (02/12 0334) Resp:  [16-28] 18 (02/12 0756) BP: (100-149)/(61-89) 142/75 (02/12 0756) SpO2:  [95 %-100 %] 97 % (02/12 0757) Weight:  [269 lb 6.4 oz (122.2 kg)] 269 lb 6.4 oz (122.2 kg) (02/11 1913)     Intake/Output from previous day: 02/11 0701 - 02/12 0700 In: 462 [P.O.:462] Out: 1065 [Urine:875; Chest Tube:190]   Physical Exam:  Cardiovascular: RRR Pulmonary: Clear to auscultation on left and diminished on right Abdomen: Soft, non tender, bowel sounds present. Extremities: Trace bilateral lower extremity edema. Wounds: Clean and dry.  No erythema or signs of infection. Chest Tube: to water seal, tidling with cough but no true air leak  Lab Results: CBC: Recent Labs    01/18/18 0345 01/19/18 0340  WBC 13.7* 13.0*  HGB 11.3* 10.8*  HCT 34.3* 33.1*  PLT 123* 138*   BMET:  Recent Labs    01/18/18 0345 01/19/18 0340  NA 136 137  K 4.2 3.9  CL 103 103  CO2 25 24  GLUCOSE 128* 144*  BUN 15 15  CREATININE 0.95 1.00  CALCIUM 8.5* 8.3*    PT/INR: No results for input(s): LABPROT, INR in the last 72 hours. ABG:  INR: Will add last result for INR, ABG once components are confirmed Will add last 4 CBG results once components are confirmed  Assessment/Plan:  1. CV - SR 2.  Pulmonary - On room air. Chest tube output with 190 cc last 24 hours. Chest tube to water seal and there is no air leak (tidling with cough). CXR this am  appears stable (right apical pneumothorax). Hope to remove chest tube soon. Encourage incentive spirometer. 3. Anemia-H and H 10.8 and 33.1 4. Mild thrombocytopenia-platelets 138,000. 5. LOC constipation 6. Remove on Q  Christian M ZimmermanPA-C 01/20/2018,8:06 AM   Patient seen and examined, agree with above No air leak- will dc chest tube Continue IS, flutter, cough for atelectasis Path still pending SCD + enoxaparin for DVT prophylaxis  Remo Lipps C. Roxan Hockey, MD [facility] [phone]

## 2018-01-21 ENCOUNTER — Inpatient Hospital Stay (HOSPITAL_COMMUNITY): Payer: PPO

## 2018-01-21 MED ORDER — KETOCONAZOLE 2 % EX CREA
1.0000 | TOPICAL_CREAM | Freq: Two times a day (BID) | CUTANEOUS | Status: DC | PRN
Start: 2018-01-21 — End: 2021-12-26

## 2018-01-21 MED ORDER — OXYCODONE HCL 5 MG PO TABS: ORAL_TABLET | ORAL | 0 refills | Status: DC

## 2018-01-21 MED ORDER — NICOTINE 14 MG/24HR TD PT24: 14.0000 mg | MEDICATED_PATCH | Freq: Every day | TRANSDERMAL | 0 refills | Status: DC

## 2018-01-21 MED ORDER — GUAIFENESIN ER 600 MG PO TB12: 1200.0000 mg | ORAL_TABLET | Freq: Two times a day (BID) | ORAL | Status: DC | PRN

## 2018-01-21 NOTE — Progress Notes (Addendum)
      [address]       [place] [address]             [phone]       5 Days Post-Op Procedure(s) (LRB): VIDEO ASSISTED THORACOSCOPY (VATS)/RIGHT MIDDLE AND LOWER LOBE LUNG LOBECTOMY WITH NODE BIOPSIES x6 (Right)  Subjective: Patient had large bowel movement. He is dressed and "ready to go home".  Objective: Vital signs in last 24 hours: Temp:  [98.1 F (36.7 C)-98.7 F (37.1 C)] 98.1 F (36.7 C) (02/13 0342) Pulse Rate:  [67-97] 67 (02/13 0342) Cardiac Rhythm: Normal sinus rhythm (02/13 0400) Resp:  [18-22] 18 (02/13 0342) BP: (123-135)/(63-73) 135/71 (02/13 0342) SpO2:  [93 %-100 %] 98 % (02/13 0342)     Intake/Output from previous day: 02/12 0701 - 02/13 0700 In: 42 [P.O.:462] Out: 30 [Chest Tube:30]   Physical Exam:  Cardiovascular: RRR Pulmonary: Clear to auscultation on left and slightly diminished on right Abdomen: Soft, non tender, bowel sounds present. Extremities: Trace bilateral lower extremity edema. Wounds: Clean and dry.  No erythema or signs of infection.   Lab Results: CBC: Recent Labs    01/19/18 0340  WBC 13.0*  HGB 10.8*  HCT 33.1*  PLT 138*   BMET:  Recent Labs    01/19/18 0340  NA 137  K 3.9  CL 103  CO2 24  GLUCOSE 144*  BUN 15  CREATININE 1.00  CALCIUM 8.3*    PT/INR: No results for input(s): LABPROT, INR in the last 72 hours. ABG:  INR: Will add last result for INR, ABG once components are confirmed Will add last 4 CBG results once components are confirmed  Assessment/Plan:  1. CV - SR with HR in the 60's, first degree heart block. On Toprol XL 50 mg and Lisinopril 10 mg daily (as taken pre op). 2.  Pulmonary - On room air.   CXR this am appears stable (tiny,right apical pneumothorax, minor subcutaneous emphysema right lateral chest wall).Encourage incentive spirometer. Final path:INVASIVE ADENOCARCINOMA, MODERATELY DIFFERENTIATED;TNM code: pT2a, pN0. Dr. Roxan Hockey will discuss with  patient. 3. Anemia-H and H 10.8 and 33.1 4. Mild thrombocytopenia-platelets 138,000. 5. Remove central line 6. Dscharge later today after Dr.Hendrickson discusses pathology with patient.  Kavita Bartl M ZimmermanPA-C 01/21/2018,8:02 AM

## 2018-01-21 NOTE — Progress Notes (Signed)
Removed PIV access & Rt. IJ and pt received instructions for how to take care of it. Nicotine patch on Lt. Shoulder. Provided discharge instructions and oxycodone prescription. Pt took his all belongings and left. HS [employer]

## 2018-01-27 ENCOUNTER — Telehealth: Payer: Self-pay

## 2018-01-27 NOTE — Telephone Encounter (Signed)
Dr. Damita Dunnings,  In review of TCM, I note that this patient has undergone thoracic surgery for lung cancer.  Appears he is being followed closely right now by them without any direction or plans to see [place] as well.  Would you like  Hospital follow up appointment with patient or just follow him through Dr. Roxan Hockey (cardiac throracic surgeon) office?  Thanks.

## 2018-01-28 NOTE — Telephone Encounter (Signed)
I think it is reasonable to f/u with Dr. Roxan Hockey.  I'll defer for now.   I routed this to Dr. Roxan Hockey for input- if I can be of use, please let me know.   I appreciate the help of all involved.

## 2018-01-29 DIAGNOSIS — G4733 Obstructive sleep apnea (adult) (pediatric): Secondary | ICD-10-CM | POA: Diagnosis not present

## 2018-02-02 ENCOUNTER — Other Ambulatory Visit: Payer: Self-pay | Admitting: Thoracic Surgery (Cardiothoracic Vascular Surgery)

## 2018-02-02 DIAGNOSIS — I712 Thoracic aortic aneurysm, without rupture, unspecified: Secondary | ICD-10-CM

## 2018-02-03 ENCOUNTER — Other Ambulatory Visit: Payer: Self-pay | Admitting: Thoracic Surgery (Cardiothoracic Vascular Surgery)

## 2018-02-03 ENCOUNTER — Ambulatory Visit
Admission: RE | Admit: 2018-02-03 | Discharge: 2018-02-03 | Disposition: A | Discharge status: Home / Self Care | Payer: PPO | Source: Ambulatory Visit | Attending: Thoracic Surgery (Cardiothoracic Vascular Surgery) | Admitting: Thoracic Surgery (Cardiothoracic Vascular Surgery)

## 2018-02-03 ENCOUNTER — Telehealth: Payer: Self-pay | Admitting: *Deleted

## 2018-02-03 ENCOUNTER — Ambulatory Visit (INDEPENDENT_AMBULATORY_CARE_PROVIDER_SITE_OTHER): Payer: Self-pay | Admitting: Thoracic Surgery (Cardiothoracic Vascular Surgery)

## 2018-02-03 VITALS — BP 120/72 | HR 67 | Resp 20 | Ht 72.0 in | Wt 264.0 lb

## 2018-02-03 DIAGNOSIS — C3491 Malignant neoplasm of unspecified part of right bronchus or lung: Secondary | ICD-10-CM

## 2018-02-03 DIAGNOSIS — I712 Thoracic aortic aneurysm, without rupture, unspecified: Secondary | ICD-10-CM

## 2018-02-03 DIAGNOSIS — Z09 Encounter for follow-up examination after completed treatment for conditions other than malignant neoplasm: Secondary | ICD-10-CM

## 2018-02-03 DIAGNOSIS — C3490 Malignant neoplasm of unspecified part of unspecified bronchus or lung: Secondary | ICD-10-CM | POA: Diagnosis not present

## 2018-02-03 NOTE — Telephone Encounter (Signed)
Fax received for orders for CPAP supplies.  Form placed in Dr. Josefine Class In [place].

## 2018-02-03 NOTE — Progress Notes (Signed)
[address]       [place], [address]             [phone]      HPI: Christian [name] returns for scheduled postoperative follow-up visit  Christian [name] is a 72 year old man history of tobacco abuse, COPD, coronary disease, MI, type 2 diabetes, peripheral neuropathy, obstructive sleep apnea, hypertension, hyperlipidemia.  He developed a cough around Thanksgiving 2018.  He was treated with antibiotics.  Chest x-ray showed a possible right lower lobe mass.  CT the chest showed a 5 x 2 cm right lower lobe mass.  This was hypermetabolic on PET with an SUV of 5.4.  There is no evidence of metastatic disease.  Bronchoscopy with biopsy showed adenocarcinoma.  I did a right lower and middle bilobectomy on 01/16/2018.  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  The nodes were negative.  His postoperative course was uneventful.  He went home on day 4.  Since discharge he has been doing well.  He is not taking any pain medication.  He does notice that his respiratory capacity is decreased compared to preop.  He has not smoked since his surgery.  Past Medical History:  Diagnosis Date  . Anxiety   . Arthritis   . Cancer ([place])    skin  . Cancer ([place])    right lung  . Coronary artery disease   . Diabetes mellitus type 2 with complications ([place])    not on medication was "taken off of list"; pt states that he was pre diabetic  . Dyspnea    walks 100 yards then rest  . GERD (gastroesophageal reflux disease)    ocassional no meds; pt states that its no longer an issue (01/14/18)  . History of shingles    2004  . Hyperlipidemia   . Hypertension   . OSA on CPAP    pt states that he stopped wearing his CPAP  . Pneumonia 10/2017    Current Outpatient Medications  Medication Sig Dispense Refill  . albuterol (PROVENTIL HFA;VENTOLIN HFA) 108 (90 BASE) MCG/ACT inhaler Inhale 2 puffs into the lungs every 6 (six) hours as needed for wheezing or shortness  of breath. (Patient taking differently: Inhale 1 puff into the lungs every 6 (six) hours as needed for wheezing or shortness of breath. ) 1 Inhaler 3  . aspirin 325 MG tablet Take 325 mg by mouth daily.    . baclofen (LIORESAL) 20 MG tablet Take 1 tablet (20 mg total) by mouth 2 (two) times daily as needed for muscle spasms. (Patient taking differently: Take 40 mg by mouth at bedtime. ) 180 each 1  . clonazePAM (KLONOPIN) 0.5 MG tablet TAKE ONE TABLET BY MOUTH AT BEDTIME AS NEEDED FOR ANXIETY OR INSOMNIA (Patient taking differently: Take 0.5 mg by mouth at bedtime. ) 30 tablet 0  . guaiFENesin (MUCINEX) 600 MG 12 hr tablet Take 2 tablets (1,200 mg total) by mouth 2 (two) times daily as needed for cough or to loosen phlegm.    Marland Kitchen ketoconazole (NIZORAL) 2 % cream Apply 1 application topically 2 (two) times daily as needed for irritation.    Marland Kitchen lisinopril (PRINIVIL,ZESTRIL) 10 MG tablet Take 1 tablet (10 mg total) by mouth daily. 90 tablet 3  . Melaton-Thean-Cham-PassF-LBalm (MELATONIN + L-THEANINE PO) Take 1 tablet by mouth at bedtime.     . metoprolol succinate (TOPROL-XL) 50 MG 24 hr tablet Take 1 tablet (50 mg total) by mouth  daily. Take with or immediately following a meal. 90 tablet 3  . nicotine (NICODERM CQ - DOSED IN MG/24 HOURS) 14 mg/24hr patch Place 1 patch (14 mg total) onto the skin daily. 28 patch 0  . nitroGLYCERIN (NITROSTAT) 0.4 MG SL tablet Place 1 tablet (0.4 mg total) under the tongue every 5 (five) minutes as needed for chest pain. 25 tablet 1  . oxyCODONE (OXY IR/ROXICODONE) 5 MG immediate release tablet Take 5 mg by mouth every 4-6 hours PRN severe pain 30 tablet 0  . simvastatin (ZOCOR) 40 MG tablet Take 1 tablet (40 mg total) by mouth at bedtime. 90 tablet 3  . vitamin E 100 UNIT capsule Take 100 Units by mouth daily.     No current facility-administered medications for this visit.     Physical Exam BP 120/72   Pulse 67   Resp 20   Ht 6' (1.829 m)   Wt 264 lb (119.7 kg)    SpO2 95% Comment: RA  BMI 35.61 kg/m  72 year old man in no acute distress Alert and oriented x3 with no focal deficits Lungs diminished at right base, otherwise clear Incisions well-healed Cardiac regular rate and rhythm normal S1 and S2  Diagnostic Tests: CHEST  2 VIEW  COMPARISON:  01/21/2018.  FINDINGS: Interim removal of right IJ line. Cardiomegaly with normal pulmonary vascularity. Right-sided pleural-parenchymal thickening consistent with scarring. Interim resolution of right pneumothorax. Right chest wall subcutaneous emphysema. Old right rib fractures.  IMPRESSION: 1. Interim resolution of right pneumothorax. Persistent right chest wall subcutaneous emphysema.  2.  Interim removal of right IJ line.  3. Right-sided pleuroparenchymal thickening again noted. Atelectatic changes in the right lung base again noted.   Electronically Signed   By: Marcello Moores  Register   On: 02/03/2018 09:24 I personally reviewed the CXR images and concur with the findings noted above  Impression: Christian [name] is a 72 year old gentleman who is now about 3 weeks out from a right VATS bilobectomy for a stage IB adenocarcinoma.  He is doing better than I expected him to in all honesty.  He has minimal discomfort and is not requiring any pain medication.  Advised him to work on gradually increasing his activities.  He had questions about whether he would need oxygen.  He should not need that.  Over time with exercise, weight loss, and smoking cessation he should be able to manage with routine activities.  There are no restrictions on his activities.  Smoking cessation instruction/counseling given:  commended patient for quitting and reviewed strategies for preventing relapses   I will arrange for him to see Dr. Julien Nordmann from oncology and our multidisciplinary thoracic oncology clinic  Plan: Referral to [place] to see Dr. Julien Nordmann Return in 6 weeks with PA and lateral chest x-ray  Melrose Nakayama, MD [facility] [phone]

## 2018-02-04 NOTE — Telephone Encounter (Signed)
I'll work on the hard copy when back in the office.  Thanks.

## 2018-02-05 ENCOUNTER — Telehealth: Payer: Self-pay | Admitting: *Deleted

## 2018-02-05 NOTE — Telephone Encounter (Signed)
Oncology Nurse Navigator Documentation  Oncology Nurse Navigator Flowsheets 02/05/2018  Navigator Location [facility]  Referral date to RadOnc/MedOnc 02/04/2018  Navigator Encounter Type Telephone/I received referral from Dr. Leonarda Salon office. I called patient to schedule for [place] next week.  He verbalized understanding of appt time and place.   Telephone Outgoing Call  Treatment Phase Pre-Tx/Tx Discussion  Barriers/Navigation Needs Coordination of Care  Interventions Coordination of Care  Coordination of Care Appts  Acuity Level 1  Time Spent with Patient 15

## 2018-02-09 ENCOUNTER — Telehealth: Payer: Self-pay | Admitting: Family Medicine

## 2018-02-09 ENCOUNTER — Encounter: Payer: Self-pay | Admitting: Internal Medicine

## 2018-02-09 NOTE — Telephone Encounter (Signed)
I did the form, let me know if that won't suffice. Thanks.

## 2018-02-09 NOTE — Telephone Encounter (Unsigned)
Copied from [place] [id number]. Topic: Quick Communication - Rx Refill/Question >> Feb 09, 2018 11:40 AM Neva Seat wrote: Cpap supplies  [place] [phone]  Is faxing the approval for the Cpap supplies for Dr. Josefine Class approval.

## 2018-02-09 NOTE — Progress Notes (Signed)
letter sent to patient in reference to [facility], arrival time also sent with letter

## 2018-02-11 ENCOUNTER — Other Ambulatory Visit: Payer: Self-pay | Admitting: Medical Oncology

## 2018-02-11 DIAGNOSIS — R918 Other nonspecific abnormal finding of lung field: Secondary | ICD-10-CM

## 2018-02-12 ENCOUNTER — Telehealth: Payer: Self-pay | Admitting: Internal Medicine

## 2018-02-12 ENCOUNTER — Encounter: Payer: Self-pay | Admitting: Internal Medicine

## 2018-02-12 ENCOUNTER — Encounter: Payer: Self-pay | Admitting: General Practice

## 2018-02-12 ENCOUNTER — Inpatient Hospital Stay: Payer: PPO

## 2018-02-12 ENCOUNTER — Inpatient Hospital Stay: Payer: PPO | Attending: Internal Medicine | Admitting: Internal Medicine

## 2018-02-12 VITALS — BP 121/55 | HR 70 | Temp 98.2°F | Resp 22 | Ht 72.0 in | Wt 262.7 lb

## 2018-02-12 DIAGNOSIS — C3491 Malignant neoplasm of unspecified part of right bronchus or lung: Secondary | ICD-10-CM

## 2018-02-12 DIAGNOSIS — C342 Malignant neoplasm of middle lobe, bronchus or lung: Secondary | ICD-10-CM | POA: Diagnosis not present

## 2018-02-12 DIAGNOSIS — I2510 Atherosclerotic heart disease of native coronary artery without angina pectoris: Secondary | ICD-10-CM | POA: Diagnosis not present

## 2018-02-12 DIAGNOSIS — R918 Other nonspecific abnormal finding of lung field: Secondary | ICD-10-CM

## 2018-02-12 DIAGNOSIS — Z87891 Personal history of nicotine dependence: Secondary | ICD-10-CM

## 2018-02-12 DIAGNOSIS — Z85828 Personal history of other malignant neoplasm of skin: Secondary | ICD-10-CM | POA: Diagnosis not present

## 2018-02-12 DIAGNOSIS — I10 Essential (primary) hypertension: Secondary | ICD-10-CM | POA: Diagnosis not present

## 2018-02-12 DIAGNOSIS — Z803 Family history of malignant neoplasm of breast: Secondary | ICD-10-CM | POA: Diagnosis not present

## 2018-02-12 DIAGNOSIS — C3431 Malignant neoplasm of lower lobe, right bronchus or lung: Secondary | ICD-10-CM

## 2018-02-12 DIAGNOSIS — E119 Type 2 diabetes mellitus without complications: Secondary | ICD-10-CM | POA: Diagnosis not present

## 2018-02-12 DIAGNOSIS — G4733 Obstructive sleep apnea (adult) (pediatric): Secondary | ICD-10-CM | POA: Diagnosis not present

## 2018-02-12 DIAGNOSIS — Z902 Acquired absence of lung [part of]: Secondary | ICD-10-CM | POA: Diagnosis not present

## 2018-02-12 DIAGNOSIS — C3490 Malignant neoplasm of unspecified part of unspecified bronchus or lung: Secondary | ICD-10-CM

## 2018-02-12 LAB — CBC WITH DIFFERENTIAL (CANCER CENTER ONLY)
Basophils Absolute: 0.1 10*3/uL (ref 0.0–0.1)
Basophils Relative: 1 %
Eosinophils Absolute: 0.3 10*3/uL (ref 0.0–0.5)
Eosinophils Relative: 4 %
HCT: 38 % — ABNORMAL LOW (ref 38.4–49.9)
Hemoglobin: 12.7 g/dL — ABNORMAL LOW (ref 13.0–17.1)
Lymphocytes Relative: 24 %
Lymphs Abs: 2 10*3/uL (ref 0.9–3.3)
MCH: 34.4 pg — ABNORMAL HIGH (ref 27.2–33.4)
MCHC: 33.3 g/dL (ref 32.0–36.0)
MCV: 103.4 fL — ABNORMAL HIGH (ref 79.3–98.0)
Monocytes Absolute: 0.9 10*3/uL (ref 0.1–0.9)
Monocytes Relative: 11 %
Neutro Abs: 4.9 10*3/uL (ref 1.5–6.5)
Neutrophils Relative %: 60 %
Platelet Count: 176 10*3/uL (ref 140–400)
RBC: 3.68 MIL/uL — ABNORMAL LOW (ref 4.20–5.82)
RDW: 14.2 % (ref 11.0–14.6)
WBC Count: 8.2 10*3/uL (ref 4.0–10.3)

## 2018-02-12 LAB — CMP (CANCER CENTER ONLY)
ALT: 17 U/L (ref 0–55)
AST: 17 U/L (ref 5–34)
Albumin: 3.6 g/dL (ref 3.5–5.0)
Alkaline Phosphatase: 81 U/L (ref 40–150)
Anion gap: 8 (ref 3–11)
BUN: 16 mg/dL (ref 7–26)
CO2: 25 mmol/L (ref 22–29)
Calcium: 9.8 mg/dL (ref 8.4–10.4)
Chloride: 107 mmol/L (ref 98–109)
Creatinine: 1.13 mg/dL (ref 0.70–1.30)
GFR, Est AFR Am: >60 mL/min (ref >60)
GFR, Estimated: >60 mL/min (ref >60)
Glucose, Bld: 109 mg/dL (ref 70–140)
Potassium: 4.7 mmol/L (ref 3.5–5.1)
Sodium: 140 mmol/L (ref 136–145)
Total Bilirubin: 0.5 mg/dL (ref 0.2–1.2)
Total Protein: 7.1 g/dL (ref 6.4–8.3)

## 2018-02-12 NOTE — Telephone Encounter (Signed)
Scheduled appt per 3/7 los - Gave patient aVS and calender per los. Central radiology to contact patient .

## 2018-02-12 NOTE — Progress Notes (Signed)
[place] [phone]   [phone] Multidisciplinary thoracic oncology clinic  CONSULT NOTE  REFERRING PHYSICIAN: Dr. Modesto Charon  REASON FOR CONSULTATION:  72 years old white male recently diagnosed with lung cancer.  HPI TARA WICH is a 72 y.o. male with past medical history significant for hypertension, diabetes mellitus, coronary artery disease, GERD, history of skin cancer, and anxiety, dyslipidemia as well as obstructive sleep apnea and long history for smoking.  The patient mentioned that around Thanksgiving time he had flulike symptoms as well as dry cough.  He was seen by his primary care physician at that time and treated with a course of antibiotics for questionable pneumonia.  Repeat chest x-ray on November 10, 2017 showed new right upper lobe/perihilar nodule that was suspicious for malignancy.  CT scan of the chest with contrast on November 12, 2017 showed 4.9 x 2.0 cm lobulated and slightly spiculated density in the right lower lobe posterior to the hilum concerning for malignancy.  A PET scan on November 18, 2017 showed elongated lobular lesion within the right lower lobe measuring 4.5 x 2.8 cm in axial dimension and has associated metabolic activity with SUV max of 5.4.  The mass extends to the central right lower lobe bronchovascular structures just inferior to the hilum.  The lesion has a branching nodular shape.  There was no additional pulmonary nodules and no hypermetabolic mediastinal or extrathoracic disease. On December 15, 2017 the patient underwent bronchoscopy with endobronchial ultrasound under the care of Dr. Roxan Hockey and the final pathology was consistent with adenocarcinoma.  MRI of the brain on December 29, 2017 showed no evidence of metastatic disease to the brain. On January 16, 2018 the patient underwent right VATS with right lower and middle bilobectomies with lymph node dissection under the care of Dr.  Roxan Hockey.  The final pathology ([id number]) showed invasive adenocarcinoma, moderately differentiated to spanning 3.9 cm.  The surgical resection margins were negative for carcinoma and there was no evidence for visceral pleural invasion or lymphovascular invasion. The patient is recovering well from his surgery.  Dr. Roxan Hockey kindly referred him to the multidisciplinary thoracic oncology clinic today for evaluation and recommendation regarding adjuvant therapy. When seen today the patient is feeling fine except for fatigue as well as shortness of breath with exertion.  He denied having any chest pain, cough or hemoptysis.  He denied having any recent weight loss or night sweats.  He has no nausea, vomiting, diarrhea or constipation.  He denied having any headache or visual changes. Family history significant for mother and father with heart disease, brother had questionable sarcoma and 2 sister had breast cancer. The patient is a widow and his wife died from cancer a few years ago.  He has 2 children his son and daughter.  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  He has a history of smoking up to 2 packs/day for around 50 years and quit in February 2019.  He drinks red wine every night.  He denied having any history of drug abuse.  HPI  Past Medical History:  Diagnosis Date  . Anxiety   . Arthritis   . Cancer ([place])    skin  . Cancer ([place])    right lung  . Coronary artery disease   . Diabetes mellitus type 2 with complications ([place])    not on medication was "taken off of list"; pt states that he was pre diabetic  . Dyspnea    walks  100 yards then rest  . GERD (gastroesophageal reflux disease)    ocassional no meds; pt states that its no longer an issue (01/14/18)  . History of shingles    2004  . Hyperlipidemia   . Hypertension   . OSA on CPAP    pt states that he stopped wearing his CPAP  . Pneumonia 10/2017    Past Surgical History:  Procedure Laterality Date  . CARDIAC  CATHETERIZATION     2010 @ [facility] (per pt)  . COLONOSCOPY W/ POLYPECTOMY    . EYE SURGERY Bilateral    cataracts  . OPEN REDUCTION INTERNAL FIXATION (ORIF) DISTAL RADIAL FRACTURE Right 11/26/2013   Procedure: OPEN REDUCTION INTERNAL FIXATION (ORIF) DISTAL RADIAL FRACTURE;  Surgeon: Newt Minion, MD;  Location: [facility];  Service: Orthopedics;  Laterality: Right;  Open Reduction Internal Fixation Right Distal Radius   . VIDEO ASSISTED THORACOSCOPY (VATS)/ LOBECTOMY Right 01/16/2018   Procedure: VIDEO ASSISTED THORACOSCOPY (VATS)/RIGHT MIDDLE AND LOWER LOBE LUNG LOBECTOMY WITH NODE BIOPSIES x6;  Surgeon: Melrose Nakayama, MD;  Location: [facility];  Service: Thoracic;  Laterality: Right;  Marland Kitchen VIDEO BRONCHOSCOPY WITH ENDOBRONCHIAL NAVIGATION Right 12/15/2017   Procedure: VIDEO BRONCHOSCOPY WITH ENDOBRONCHIAL NAVIGATION;  Surgeon: Melrose Nakayama, MD;  Location: [facility];  Service: Thoracic;  Laterality: Right;  Marland Kitchen VIDEO BRONCHOSCOPY WITH ENDOBRONCHIAL ULTRASOUND N/A 12/15/2017   Procedure: VIDEO BRONCHOSCOPY WITH ENDOBRONCHIAL ULTRASOUND;  Surgeon: Melrose Nakayama, MD;  Location: [facility] OR;  Service: Thoracic;  Laterality: N/A;    Family History  Problem Relation Age of Onset  . Heart disease Mother   . Heart disease Father   . Colon cancer Father        possible colon or prostate cancer, dx'd in his 97s, patient wasn't sure of source.   . Prostate cancer Father        possible colon or prostate cancer, dx'd in his 45s, patient wasn't sure of source.   Marland Kitchen Heart disease Brother   . Diabetes Brother   . Kidney disease Brother   . Heart disease Sister   . Cancer Sister     Social History Social History   Tobacco Use  . Smoking status: Former Smoker    Packs/day: 1.00    Years: 51.00    Pack years: 51.00    Types: Cigarettes, E-cigarettes    Last attempt to quit: 01/15/2018    Years since quitting: 0.0  . Smokeless tobacco: Never Used  . Tobacco comment: pt "considering" stopping    Substance Use Topics  . Alcohol use: Yes    Alcohol/week: 4.2 oz    Types: 7 Glasses of wine per week    Comment: 1 glass of wine per night-   . Drug use: No    Allergies  Allergen Reactions  . Celebrex [Celecoxib] Itching    Current Outpatient Medications  Medication Sig Dispense Refill  . albuterol (PROVENTIL HFA;VENTOLIN HFA) 108 (90 BASE) MCG/ACT inhaler Inhale 2 puffs into the lungs every 6 (six) hours as needed for wheezing or shortness of breath. (Patient taking differently: Inhale 1 puff into the lungs every 6 (six) hours as needed for wheezing or shortness of breath. ) 1 Inhaler 3  . aspirin 325 MG tablet Take 325 mg by mouth daily.    . baclofen (LIORESAL) 20 MG tablet Take 1 tablet (20 mg total) by mouth 2 (two) times daily as needed for muscle spasms. (Patient taking differently: Take 40 mg by mouth at bedtime. )  180 each 1  . clonazePAM (KLONOPIN) 0.5 MG tablet TAKE ONE TABLET BY MOUTH AT BEDTIME AS NEEDED FOR ANXIETY OR INSOMNIA (Patient taking differently: Take 0.5 mg by mouth at bedtime. ) 30 tablet 0  . ketoconazole (NIZORAL) 2 % cream Apply 1 application topically 2 (two) times daily as needed for irritation.    Marland Kitchen lisinopril (PRINIVIL,ZESTRIL) 10 MG tablet Take 1 tablet (10 mg total) by mouth daily. 90 tablet 3  . Melaton-Thean-Cham-PassF-LBalm (MELATONIN + L-THEANINE PO) Take 1 tablet by mouth at bedtime.     . metoprolol succinate (TOPROL-XL) 50 MG 24 hr tablet Take 1 tablet (50 mg total) by mouth daily. Take with or immediately following a meal. 90 tablet 3  . simvastatin (ZOCOR) 40 MG tablet Take 1 tablet (40 mg total) by mouth at bedtime. 90 tablet 3  . vitamin E 100 UNIT capsule Take 100 Units by mouth daily.    Marland Kitchen guaiFENesin (MUCINEX) 600 MG 12 hr tablet Take 2 tablets (1,200 mg total) by mouth 2 (two) times daily as needed for cough or to loosen phlegm. (Patient not taking: Reported on 02/12/2018)    . nicotine (NICODERM CQ - DOSED IN MG/24 HOURS) 14 mg/24hr patch  Place 1 patch (14 mg total) onto the skin daily. (Patient not taking: Reported on 02/12/2018) 28 patch 0  . nitroGLYCERIN (NITROSTAT) 0.4 MG SL tablet Place 1 tablet (0.4 mg total) under the tongue every 5 (five) minutes as needed for chest pain. (Patient not taking: Reported on 02/12/2018) 25 tablet 1  . oxyCODONE (OXY IR/ROXICODONE) 5 MG immediate release tablet Take 5 mg by mouth every 4-6 hours PRN severe pain (Patient not taking: Reported on 02/12/2018) 30 tablet 0   No current facility-administered medications for this visit.     Review of Systems  Constitutional: positive for fatigue Eyes: negative Ears, nose, mouth, throat, and face: negative Respiratory: positive for dyspnea on exertion Cardiovascular: negative Gastrointestinal: negative Genitourinary:negative Integument/breast: negative Hematologic/lymphatic: negative Musculoskeletal:negative Neurological: negative Behavioral/Psych: negative Endocrine: negative Allergic/Immunologic: negative  Physical Exam  WJX:BJYNW, healthy, no distress, well nourished and well developed SKIN: skin color, texture, turgor are normal, no rashes or significant lesions HEAD: Normocephalic, No masses, lesions, tenderness or abnormalities EYES: normal, PERRLA, Conjunctiva are pink and non-injected EARS: External ears normal, Canals clear OROPHARYNX:no exudate, no erythema and lips, buccal mucosa, and tongue normal  NECK: supple, no adenopathy, no JVD LYMPH:  no palpable lymphadenopathy, no hepatosplenomegaly LUNGS: clear to auscultation , and palpation HEART: regular rate & rhythm, no murmurs and no gallops ABDOMEN:abdomen soft, non-tender, obese, normal bowel sounds and no masses or organomegaly BACK: Back symmetric, no curvature., No CVA tenderness EXTREMITIES:no joint deformities, effusion, or inflammation, no edema, no skin discoloration  NEURO: alert & oriented x 3 with fluent speech, no focal motor/sensory deficits  PERFORMANCE STATUS:  ECOG 1  LABORATORY DATA: Lab Results  Component Value Date   WBC 8.2 02/12/2018   HGB 10.8 (L) 01/19/2018   HCT 38.0 (L) 02/12/2018   MCV 103.4 (H) 02/12/2018   PLT 176 02/12/2018      Chemistry      Component Value Date/Time   NA 140 02/12/2018 1529   K 4.7 02/12/2018 1529   CL 107 02/12/2018 1529   CO2 25 02/12/2018 1529   BUN 16 02/12/2018 1529   CREATININE 1.13 02/12/2018 1529   CREATININE 0.99 07/11/2016 1448      Component Value Date/Time   CALCIUM 9.8 02/12/2018 1529   ALKPHOS 81 02/12/2018 1529  AST 17 02/12/2018 1529   ALT 17 02/12/2018 1529   BILITOT 0.5 02/12/2018 1529       RADIOGRAPHIC STUDIES: Dg Chest 2 View  Result Date: 02/03/2018 CLINICAL DATA:  Shortness of breath.  Lung cancer. EXAM: CHEST  2 VIEW COMPARISON:  01/21/2018. FINDINGS: Interim removal of right IJ line. Cardiomegaly with normal pulmonary vascularity. Right-sided pleural-parenchymal thickening consistent with scarring. Interim resolution of right pneumothorax. Right chest wall subcutaneous emphysema. Old right rib fractures. IMPRESSION: 1. Interim resolution of right pneumothorax. Persistent right chest wall subcutaneous emphysema. 2.  Interim removal of right IJ line. 3. Right-sided pleuroparenchymal thickening again noted. Atelectatic changes in the right lung base again noted. Electronically Signed   By: Marcello Moores  Register   On: 02/03/2018 09:24   Dg Chest 2 View  Result Date: 01/21/2018 CLINICAL DATA:  Status post right middle and lower lobectomies. EXAM: CHEST  2 VIEW COMPARISON:  01/20/2018 FINDINGS: Right jugular catheter terminates over the mid to lower SVC, unchanged. The cardiomediastinal silhouette is unchanged. Postoperative changes are again noted with right hemithorax volume loss. Patchy right basilar lung opacity and small right pleural effusion have not significantly changed. No left lung consolidation is seen. There is a tiny right apical pneumothorax. IMPRESSION: 1. Tiny right  apical pneumothorax. 2. Unchanged right basilar atelectasis and small right pleural effusion. Electronically Signed   By: Logan Bores M.D.   On: 01/21/2018 07:54   Dg Chest 2 View  Result Date: 01/14/2018 CLINICAL DATA:  Non-small cell carcinoma of the right lower lobe. EXAM: CHEST  2 VIEW COMPARISON:  12/12/2017 and PET-CT dated 11/18/2017 and CT scan dated 11/11/2017 FINDINGS: Again noted is the poorly defined mass in the posterior aspect of the right lower lobe, unchanged. The lungs are otherwise clear. Heart size and vascularity are normal. No effusions. No acute bone abnormality. Old right rib fractures. Aortic atherosclerosis. IMPRESSION: 1. Ill-defined mass in the right lower lobe demonstrate represent non-small cell carcinoma. 2.  Aortic Atherosclerosis (ICD10-I70.0). Electronically Signed   By: Lorriane Shire M.D.   On: 01/14/2018 17:09   Dg Chest Port 1 View  Result Date: 01/20/2018 CLINICAL DATA:  Chest tube removal. Right middle and lower lobectomies. EXAM: PORTABLE CHEST 1 VIEW 11:10 a.m. COMPARISON:  Radiographs dated 01/20/2018 at 6:18 a.m., 01/19/2018, 01/18/2017 and 01/17/2017 FINDINGS: Right-sided chest tube has been removed. No discrete pneumothorax. Improved aeration at the right lung base with slight residual atelectasis and probable small right effusion. Left lung is clear.  Heart size and vascularity are normal. Central line remains unchanged. IMPRESSION: 1. No appreciable pneumothorax after chest tube removal. 2. Improved atelectasis and probable small effusion at the right lung base. Electronically Signed   By: Lorriane Shire M.D.   On: 01/20/2018 11:34   Dg Chest Port 1 View  Result Date: 01/20/2018 CLINICAL DATA:  Status post lobectomy on the right EXAM: PORTABLE CHEST 1 VIEW COMPARISON:  January 19, 2018 FINDINGS: Postoperative changes are noted on the right. There remains a chest tube on the right from a somewhat lateral approach with stable right apical pneumothorax. More  medial chest tube is no longer appreciable. Central catheter tip is in the superior vena cava. There is volume loss the right with small right pleural effusion and atelectasis in the right base. Left lung is mildly hyperexpanded but clear. Heart is upper normal in size with pulmonary vascularity within normal limits. No adenopathy. No bone lesions. IMPRESSION: The more medially placed chest tube has been removed from the right  side. More laterally placed chest tube on the right remains. Stable right apical region pneumothorax. No tension component. There is postoperative change on the right with small right pleural effusion and atelectatic change in the right base region. Left lung hyperexpanded but clear. Stable cardiac silhouette. Electronically Signed   By: Lowella Grip III M.D.   On: 01/20/2018 08:15   Dg Chest Port 1 View  Result Date: 01/19/2018 CLINICAL DATA:  Status post right middle and lower lobectomy 4 days ago EXAM: PORTABLE CHEST 1 VIEW COMPARISON:  Portable chest x-ray of January 18, 2018 FINDINGS: The left lung is well-expanded. There is mild shift of the mediastinum on the right. There is persistent volume loss on the right. An approximately 15% apical pneumothorax on the right is suspected. The upper chest tube tip projects at the level of the posterolateral aspect of the third rib. The medial chest tube projects at approximately the 6 posterior rib level medially. A small amount of pleural fluid at the right base is suspected as well as atelectasis or pneumonia. The cardiac silhouette remains enlarged. The pulmonary vascularity is not engorged. The right internal jugular venous catheter tip projects over the midportion of the SVC. IMPRESSION: Probable 15-20% right-sided pneumothorax. The 2 right chest tubes are in reasonable position. There is persistent postsurgical volume loss on the right. Stable cardiomegaly without pulmonary edema. Electronically Signed   By: David  Martinique M.D.   On:  01/19/2018 07:36   Dg Chest Port 1 View  Result Date: 01/18/2018 CLINICAL DATA:  Status post VATS, right middle and lower lobectomies. EXAM: PORTABLE CHEST 1 VIEW COMPARISON:  01/17/2018 and prior radiographs FINDINGS: Cardiomegaly, right thoracic surgical changes, right IJ central venous catheter with tip overlying the lower SVC, and 2 right thoracostomy tubes are again noted. There is no evidence of pneumothorax. Mild right basilar atelectasis/airspace disease again noted. There has been little interval change since the prior study. IMPRESSION: Unchanged appearance of the chest with support apparatus as described and mild right basilar atelectasis/airspace disease. Electronically Signed   By: Margarette [place] M.D.   On: 01/18/2018 06:48   Dg Chest Port 1 View  Result Date: 01/17/2018 CLINICAL DATA:  Status post right middle and lower lobectomy. EXAM: PORTABLE CHEST 1 VIEW COMPARISON:  01/16/2018 and prior exams FINDINGS: Right-sided surgical changes are again noted as well as 2 right thoracostomy tubes and a right IJ central venous catheter with tip overlying the superior cavoatrial junction. There is no evidence of pneumothorax. Mild right basilar atelectasis/airspace disease is unchanged. The left lung is clear. IMPRESSION: Postoperative changes as described with support apparatus and mild right basilar atelectasis/airspace disease. No evidence of pneumothorax. Electronically Signed   By: Margarette Canada M.D.   On: 01/17/2018 06:50   Dg Chest Port 1 View  Result Date: 01/16/2018 CLINICAL DATA:  Right middle lobe and lower lobectomy. EXAM: PORTABLE CHEST 1 VIEW COMPARISON:  01/14/2018 FINDINGS: Postsurgical changes in the right lung with atelectasis from interval right middle and lower lobectomy. Two right-sided chest tubes are present. No pneumothorax. Bilateral mild interstitial prominence.  No pleural effusion. Stable cardiac silhouette. Increased opacity along the right paramediastinal region likely  reflecting postoperative hematoma and postsurgical changes. Right jugular central venous catheter with the tip projecting over the SVC. IMPRESSION: Increased opacity along the right paramediastinal region likely reflecting postoperative hematoma and postsurgical changes. Right jugular central venous catheter with the tip projecting over the SVC. Postsurgical changes in the right lung with atelectasis from interval right middle  and lower lobectomy. Two right-sided chest tubes are present. No pneumothorax. Electronically Signed   By: Kathreen Devoid   On: 01/16/2018 15:53    ASSESSMENT: This is a very pleasant 72 years old white male recently diagnosed with a stage Ib (T2a, N0, M0) non-small cell lung cancer, adenocarcinoma presented with right lower lobe lung mass status post right middle and lower bilobectomies with lymph node dissection under the care of Dr. Roxan Hockey on January 16, 2018.   PLAN: I had a lengthy discussion with the patient today about his current disease of stage, prognosis and treatment options.  I explained to the patient that the 5-year survival for patient with a stage Ib is around 60%.  I also explained to the patient that there is no survival benefit for adjuvant systemic chemotherapy for patient with a stage Ib if the tumor size is less than 4.0 cm.  I offered the adjuvant therapy for the patient because of his tumor size close to the 4.0 cm but the patient had bad experience with chemotherapy with his wife.  He would not consider systemic chemotherapy at this point but may think about it in the future if he has disease recurrence. He would like to continue on observation for now. I will arrange for the patient to come back for follow-up visit in 6 months for evaluation with repeat CT scan of the chest for restaging of his disease. The patient was advised to call immediately if he has any concerning symptoms in the interval. The patient voices understanding of current disease status  and treatment options and is in agreement with the current care plan.  All questions were answered. The patient knows to call the clinic with any problems, questions or concerns. We can certainly see the patient much sooner if necessary.  Thank you so much for allowing me to participate in the care of Sallee Provencal. I will continue to follow up the patient with you and assist in his care.  I spent 40 minutes counseling the patient face to face. The total time spent in the appointment was 60 minutes.  Disclaimer: This note was dictated with voice recognition software. Similar sounding words can inadvertently be transcribed and may not be corrected upon review.   Eilleen Kempf February 12, 2018, 4:28 PM

## 2018-02-13 NOTE — Progress Notes (Signed)
[place] Psychosocial Distress Screening Clinical Social Work  Clinical Social Work was referred by distress screening protocol.  The patient scored a unknown value on the Psychosocial Distress Thermometer which indicates unknown distress. Clinical Social Worker Edwyna Shell to assess for distress and other psychosocial needs. CSW and patient discussed common feeling and emotions when being diagnosed with cancer, and the importance of support during treatment. CSW informed patient of the support team and support services at [facility]. CSW provided contact information and encouraged patient to call with any questions or concerns.  Wife died of cancer several years ago - "I know that if the cancer is in you, there's nothing you can do, I dont want treatment."  Lives alone, attends to his own needs.  Some contact w friends, but no significant support in the home.  Feels confident he can take care of his needs, matter of fact about cancer and disease process.  Declined offers of help, encouraged to recontact if desired in future.    No distress screen data entered.  Clinical Social Worker follow up needed: No.  If yes, follow up plan:  Edwyna Shell, LCSW Clinical Social Worker Phone:  [phone]

## 2018-03-16 ENCOUNTER — Other Ambulatory Visit: Payer: Self-pay | Admitting: Thoracic Surgery (Cardiothoracic Vascular Surgery)

## 2018-03-16 DIAGNOSIS — E785 Hyperlipidemia, unspecified: Secondary | ICD-10-CM | POA: Diagnosis not present

## 2018-03-16 DIAGNOSIS — I119 Hypertensive heart disease without heart failure: Secondary | ICD-10-CM | POA: Diagnosis not present

## 2018-03-16 DIAGNOSIS — G4733 Obstructive sleep apnea (adult) (pediatric): Secondary | ICD-10-CM | POA: Diagnosis not present

## 2018-03-16 DIAGNOSIS — R918 Other nonspecific abnormal finding of lung field: Secondary | ICD-10-CM

## 2018-03-16 DIAGNOSIS — I2510 Atherosclerotic heart disease of native coronary artery without angina pectoris: Secondary | ICD-10-CM | POA: Diagnosis not present

## 2018-03-17 ENCOUNTER — Encounter: Payer: Self-pay | Admitting: Thoracic Surgery (Cardiothoracic Vascular Surgery)

## 2018-04-11 ENCOUNTER — Other Ambulatory Visit: Payer: Self-pay | Admitting: Family Medicine

## 2018-04-13 NOTE — Telephone Encounter (Signed)
Electronic refill request. Baclofen Last office visit:   11/13/2017 Last Filled:    180 each 1 08/05/2017  Please advise.

## 2018-04-13 NOTE — Telephone Encounter (Signed)
Sent. Thanks.  Needs f/u with A1c at OV.

## 2018-04-14 NOTE — Telephone Encounter (Signed)
Spoke with patient and appt [name], RMA

## 2018-04-14 NOTE — Telephone Encounter (Signed)
Please schedule appointment as instructed. 

## 2018-05-07 ENCOUNTER — Encounter: Payer: Self-pay | Admitting: Family Medicine

## 2018-05-07 ENCOUNTER — Ambulatory Visit (INDEPENDENT_AMBULATORY_CARE_PROVIDER_SITE_OTHER): Payer: PPO | Admitting: Family Medicine

## 2018-05-07 VITALS — BP 128/70 | HR 76 | Temp 98.5°F | Ht 72.0 in | Wt 257.2 lb

## 2018-05-07 DIAGNOSIS — G4700 Insomnia, unspecified: Secondary | ICD-10-CM | POA: Diagnosis not present

## 2018-05-07 DIAGNOSIS — R918 Other nonspecific abnormal finding of lung field: Secondary | ICD-10-CM

## 2018-05-07 DIAGNOSIS — E118 Type 2 diabetes mellitus with unspecified complications: Secondary | ICD-10-CM

## 2018-05-07 LAB — POCT GLYCOSYLATED HEMOGLOBIN (HGB A1C): Hemoglobin A1C: 6.4 % — AB (ref 4.0–5.6)

## 2018-05-07 MED ORDER — CLONAZEPAM 0.5 MG PO TABS: 0.5000 mg | ORAL_TABLET | Freq: Every evening | ORAL | 1 refills | Status: DC | PRN

## 2018-05-07 NOTE — Patient Instructions (Addendum)
Yearly medicare visit here in the fall (late September) after you see Dr. Julien Nordmann.  We can do labs at that point if needed.  Take care.  Glad to see you.  Update me as needed.  Marland Kitchen

## 2018-05-07 NOTE — Progress Notes (Signed)
H/o diabetes.   No meds.  Hypoglycemic episodes: no sx Hyperglycemic episodes: no sx Feet problems: no Blood Sugars averaging: not checked often.  eye exam within last year:yes  H/o lung cancer.  He is using vape but not smoking.  Rare use of SABA, none in the last week, variable use.  Not daily use.  D/w pt.  Routine cautions d/w pt.  He has routine onc f/u pending.  No hemoptysis.  D/w pt about BZD use.  Used prn at night, not nightly, for insomnia.  No ADE on med.  It helps with sleep when needed.  Routine cautions d/w pt.   Meds, vitals, and allergies reviewed.   ROS: Per HPI unless specifically indicated in ROS section   GEN: nad, alert and oriented HEENT: mucous membranes moist NECK: supple w/o LA CV: rrr. PULM: ctab, no inc wob ABD: soft, +bs EXT: no edema SKIN: well perfused.

## 2018-05-08 NOTE — Assessment & Plan Note (Signed)
No change in meds.  Labs discussed with patient.  Continue work on diet and exercise.  Recheck periodically.  He agrees.

## 2018-05-08 NOTE — Assessment & Plan Note (Signed)
D/w pt about BZD use.  Used prn at night, not nightly, for insomnia.  No ADE on med.  It helps with sleep when needed.  Routine cautions d/w pt. Continue as is.

## 2018-05-08 NOTE — Assessment & Plan Note (Signed)
Status post resection.  He has routine oncology follow-up pending.  I appreciate the help of all involved.

## 2018-05-11 ENCOUNTER — Ambulatory Visit (INDEPENDENT_AMBULATORY_CARE_PROVIDER_SITE_OTHER): Payer: PPO | Admitting: Family Medicine

## 2018-05-11 ENCOUNTER — Ambulatory Visit: Payer: Self-pay

## 2018-05-11 ENCOUNTER — Telehealth: Payer: Self-pay

## 2018-05-11 ENCOUNTER — Encounter: Payer: Self-pay | Admitting: Family Medicine

## 2018-05-11 DIAGNOSIS — W57XXXA Bitten or stung by nonvenomous insect and other nonvenomous arthropods, initial encounter: Secondary | ICD-10-CM | POA: Diagnosis not present

## 2018-05-11 DIAGNOSIS — S40861A Insect bite (nonvenomous) of right upper arm, initial encounter: Secondary | ICD-10-CM

## 2018-05-11 DIAGNOSIS — Z23 Encounter for immunization: Secondary | ICD-10-CM

## 2018-05-11 MED ORDER — DOXYCYCLINE HYCLATE 100 MG PO TABS: 100.0000 mg | ORAL_TABLET | Freq: Two times a day (BID) | ORAL | 0 refills | Status: DC

## 2018-05-11 NOTE — Telephone Encounter (Signed)
Thanks

## 2018-05-11 NOTE — Patient Instructions (Signed)
Keep the bite very clean with soap and water  Stay cool (itch gets worse when you are hot)  Benadryl cream is fine for itch  Oral benadryl is also ok  Take the doxycycline as directed   Will update tetanus shot today   Take care of yourself   It bite gets bigger/ more red or painful or swollen please let us know

## 2018-05-11 NOTE — Telephone Encounter (Signed)
Pt. called to report he started having itching of the right wrist area on Friday night.  Stated that he noticed the right forearm was starting to look "black and blue".  Today reported the right forearm is swollen from the hand to approx. 2 inches below the elbow. Stated there is redness at the site, and there is a black and blue discoloration from wrist to elbow.  Denied fever/ chills.  Stated there had been a lump that he squeezed fluid out of, and noted the lump went down.  Denied pain.  Stated that it itches a lot.  Reported he took Benadryl last night. Asking about Rx for antibiotic.  Advised he will need to be seen at the office, to evaluate the bite.    Sent message to [facility] at office.  Appt. Given for 11:15 AM today with Dr. Glori Bickers.  Pt. agrees with plan.         Reason for Disposition . Bite starts to look bad (e.g., blister, purplish skin, ulcer) (Exception:  just swelling or small red bump)  Answer Assessment - Initial Assessment Questions 1. TYPE of SPIDER: "What type of spider was it?"  (e.g., name, unknown, or brief description)     Unknown 2. LOCATION: "Where is the bite located?"      Right wrist 3. PAIN: "Is there any pain?" If so, ask: "How bad is it?"  (Scale 1-10; or mild, moderate, severe)     Denied pain 4. SWELLING: "How big is the swelling?" (Inches, cm or compare to coins)      Moderate swelling from hand to elbow; Itching  5. ONSET: "When did the bite occur?" (Minutes or hours ago)      Friday night 6. TETANUS: "When was the last tetanus booster?"      unknown 7. OTHER SYMPTOMS: "Do you have any other symptoms?"  (e.g., muscle cramps, abdominal pain, change in urine color)     Redness surrounding the bite ; black and blue from wrist to elbow  Protocols used: [place]

## 2018-05-11 NOTE — Telephone Encounter (Signed)
error 

## 2018-05-11 NOTE — Progress Notes (Signed)
Subjective:    Patient ID: Christian [name], male    DOB: [birth year], 72 y.o.   MRN: [id number]  HPI  Here for an insect bite ? 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 Insect bite of right arm 05/11/2018  . S/P lobectomy of lung 01/16/2018  . Lung mass 11/12/2017  . Thoracic aortic aneurysm ([place]) 11/12/2017  . Health care maintenance 08/06/2017  . Insomnia 08/06/2017  . PVD (peripheral vascular disease) ([place]) 08/06/2017  . Advance care planning 01/29/2017  . OSA on CPAP 01/29/2017  . Rash 01/29/2017  . Diabetes mellitus type 2 with complications ([place])   . Chest pain 05/05/2016  . LV dysfunction 12/13/2015  . CAD (coronary artery disease) 12/12/2015  . History of cardiac cath 12/12/2015  . Obstructive chronic bronchitis without exacerbation ([place]) 07/28/2014  . Anxiety state 07/28/2014  . Essential hypertension, benign 07/28/2014  . Hyperlipidemia 07/28/2014  . Chronic low back pain 07/28/2014   Past Medical History:  Diagnosis Date  . Anxiety   . Arthritis   . Cancer ([place])    skin  . Cancer ([place])    right lung  . Coronary artery disease   . Diabetes mellitus type 2 with complications ([place])    not on medication was "taken off of list"; pt states that he was pre diabetic  . Dyspnea      walks 100 yards then rest  . GERD (gastroesophageal reflux disease)    ocassional no meds; pt states that its no longer an issue (01/14/18)  . History of shingles    2004  . Hyperlipidemia   . Hypertension   . OSA on CPAP    pt states that he stopped wearing his CPAP  . Pneumonia 10/2017   Past Surgical History:  Procedure Laterality Date  . CARDIAC CATHETERIZATION     2010 @ [facility] (per pt)  . COLONOSCOPY W/ POLYPECTOMY    . EYE SURGERY Bilateral    cataracts  . OPEN REDUCTION INTERNAL FIXATION (ORIF) DISTAL RADIAL FRACTURE Right 11/26/2013   Procedure: OPEN REDUCTION INTERNAL FIXATION (ORIF) DISTAL RADIAL FRACTURE;  Surgeon: Newt Minion, MD;  Location: [place];  Service: Orthopedics;  Laterality: Right;  Open Reduction Internal Fixation Right Distal Radius   . VIDEO ASSISTED THORACOSCOPY (VATS)/ LOBECTOMY Right 01/16/2018   Procedure: VIDEO ASSISTED THORACOSCOPY (VATS)/RIGHT MIDDLE AND LOWER LOBE LUNG LOBECTOMY WITH NODE BIOPSIES x6;  Surgeon: Melrose Nakayama, MD;  Location: [place];  Service: Thoracic;  Laterality: Right;  Marland Kitchen VIDEO BRONCHOSCOPY WITH ENDOBRONCHIAL NAVIGATION Right 12/15/2017   Procedure: VIDEO  BRONCHOSCOPY WITH ENDOBRONCHIAL NAVIGATION;  Surgeon: Melrose Nakayama, MD;  Location: [place];  Service: Thoracic;  Laterality: Right;  Marland Kitchen VIDEO BRONCHOSCOPY WITH ENDOBRONCHIAL ULTRASOUND N/A 12/15/2017   Procedure: VIDEO BRONCHOSCOPY WITH ENDOBRONCHIAL ULTRASOUND;  Surgeon: Melrose Nakayama, MD;  Location: [facility] OR;  Service: Thoracic;  Laterality: N/A;   Social History   Tobacco Use  . Smoking status: Former Smoker    Packs/day: 1.00    Years: 51.00    Pack years: 51.00    Types: Cigarettes, E-cigarettes    Last attempt to quit: 01/15/2018    Years since quitting: 0.3  . Smokeless tobacco: Never Used  . Tobacco comment: pt "considering" stopping  Substance Use Topics  . Alcohol use: Yes    Alcohol/week: 4.2 oz    Types: 7 Glasses of wine per week     Comment: 1 glass of wine per night-   . Drug use: No   Family History  Problem Relation Age of Onset  . Heart disease Mother   . Heart disease Father   . Colon cancer Father        possible colon or prostate cancer, dx'd in his 61s, patient wasn't sure of source.   . Prostate cancer Father        possible colon or prostate cancer, dx'd in his 51s, patient wasn't sure of source.   Marland Kitchen Heart disease Brother   . Diabetes Brother   . Kidney disease Brother   . Heart disease Sister   . Cancer Sister    Allergies  Allergen Reactions  . Celebrex [Celecoxib] Itching   Current Outpatient Medications on File Prior to Visit  Medication Sig Dispense Refill  . albuterol (PROVENTIL HFA;VENTOLIN HFA) 108 (90 BASE) MCG/ACT inhaler Inhale 2 puffs into the lungs every 6 (six) hours as needed for wheezing or shortness of breath. (Patient taking differently: Inhale 1 puff into the lungs every 6 (six) hours as needed for wheezing or shortness of breath. ) 1 Inhaler 3  . aspirin 325 MG tablet Take 325 mg by mouth daily.    . baclofen (LIORESAL) 20 MG tablet TAKE 1 TABLET (20 MG TOTAL) BY MOUTH 2 (TWO) TIMES DAILY AS NEEDED FOR MUSCLE SPASMS. 180 tablet 0  . clonazePAM (KLONOPIN) 0.5 MG tablet Take 1 tablet (0.5 mg total) by mouth at bedtime as needed. 30 tablet 1  . diphenhydrAMINE (BENADRYL) 25 mg capsule Take by mouth.    Marland Kitchen guaiFENesin (MUCINEX) 600 MG 12 hr tablet Take 2 tablets (1,200 mg total) by mouth 2 (two) times daily as needed for cough or to loosen phlegm.    Marland Kitchen ketoconazole (NIZORAL) 2 % cream Apply 1 application topically 2 (two) times daily as needed for irritation.    Marland Kitchen lisinopril (PRINIVIL,ZESTRIL) 10 MG tablet Take 1 tablet (10 mg total) by mouth daily. 90 tablet 3  . Melatonin 1 MG TABS Take by mouth.    . metoprolol succinate (TOPROL-XL) 50 MG 24 hr tablet Take 1 tablet (50 mg total) by mouth daily. Take with or immediately following a meal. 90 tablet 3  . nitroGLYCERIN (NITROSTAT) 0.4 MG  SL tablet Place 1 tablet (0.4 mg total) under the tongue every 5 (five) minutes as needed for chest pain. 25 tablet 1  . simvastatin (ZOCOR) 40 MG tablet Take 1 tablet (40 mg total) by mouth at bedtime. 90 tablet 3  . tizanidine (ZANAFLEX) 2 MG capsule Take by mouth.    . vitamin E 100 UNIT capsule Take 100  Units by mouth daily.     No current facility-administered medications on file prior to visit.     Review of Systems  Constitutional: Negative for activity change, appetite change, fatigue, fever and unexpected weight change.  HENT: Negative for congestion, rhinorrhea, sore throat and trouble swallowing.   Eyes: Negative for pain, redness, itching and visual disturbance.  Respiratory: Negative for cough, chest tightness, shortness of breath and wheezing.   Cardiovascular: Negative for chest pain and palpitations.  Gastrointestinal: Negative for abdominal pain, blood in stool, constipation, diarrhea and nausea.  Endocrine: Negative for cold intolerance, heat intolerance, polydipsia and polyuria.  Genitourinary: Negative for difficulty urinating, dysuria, frequency and urgency.  Musculoskeletal: Negative for arthralgias, joint swelling and myalgias.  Skin: Positive for wound. Negative for pallor and rash.  Neurological: Negative for dizziness, tremors, weakness, numbness and headaches.  Hematological: Negative for adenopathy. Bruises/bleeds easily.  Psychiatric/Behavioral: Negative for decreased concentration and dysphoric mood. The patient is not nervous/anxious.        Objective:   Physical Exam  Constitutional: He is oriented to person, place, and time. He appears well-developed and well-nourished. No distress.  Well appearing   HENT:  Head: Normocephalic and atraumatic.  Eyes: Pupils are equal, round, and reactive to light. Conjunctivae and EOM are normal. No scleral icterus.  Neck: Normal range of motion. Neck supple.  Cardiovascular: Normal rate and regular rhythm.    Pulmonary/Chest: Effort normal and breath sounds normal. No respiratory distress.  Lymphadenopathy:    He has no cervical adenopathy.  Neurological: He is alert and oriented to person, place, and time. No cranial nerve deficit.  Skin: Skin is warm and dry. Capillary refill takes less than 2 seconds. No rash noted. No pallor.  Bruising consistent with asa use bilat forearms   Very tanned  Wound R arm just proximal to wrist- 1-2 cm of induration and mild erythema with small scab in the middle (no evidence of necrosis) No d/c/ not fluctuant  Non tender Swelling of area surrounding and 1/2 way up forearm  Few excoriations from scratching    Psychiatric: He has a normal mood and affect.  Quiet- either timid or not talkative          Assessment & Plan:   Problem List Items Addressed This Visit      Musculoskeletal and Integument   Insect bite of right arm    Unsure what insect-none seen  Doubt spider since no spider was seen  Reassuring exam-no necrosis , suspect primarily allergic rxn but cannot r/o early infection  Px doxycycline  Continue antihistamines Keep clean Update Td today  Meds ordered this encounter  Medications  . doxycycline (VIBRA-TABS) 100 MG tablet    Sig: Take 1 tablet (100 mg total) by mouth 2 (two) times daily.    Dispense:  14 tablet    Refill:  0         Relevant Orders   Td : Tetanus/diphtheria >7yo Preservative  free (Completed)

## 2018-05-11 NOTE — Assessment & Plan Note (Signed)
Unsure what insect-none seen  Doubt spider since no spider was seen  Reassuring exam-no necrosis , suspect primarily allergic rxn but cannot r/o early infection  Px doxycycline  Continue antihistamines Keep clean Update Td today  Meds ordered this encounter  Medications  . doxycycline (VIBRA-TABS) 100 MG tablet    Sig: Take 1 tablet (100 mg total) by mouth 2 (two) times daily.    Dispense:  14 tablet    Refill:  0

## 2018-07-16 ENCOUNTER — Other Ambulatory Visit: Payer: Self-pay | Admitting: Family Medicine

## 2018-07-16 NOTE — Telephone Encounter (Signed)
Electronic refill request. Baclofen Last office visit:   05/07/18 DM Last Filled:    180 tablet 0 04/13/2018  Please advise.

## 2018-07-17 NOTE — Telephone Encounter (Signed)
Sent. Thanks.   

## 2018-07-29 DIAGNOSIS — G4733 Obstructive sleep apnea (adult) (pediatric): Secondary | ICD-10-CM | POA: Diagnosis not present

## 2018-07-31 ENCOUNTER — Other Ambulatory Visit: Payer: Self-pay | Admitting: Family Medicine

## 2018-08-14 ENCOUNTER — Encounter (HOSPITAL_COMMUNITY): Payer: Self-pay

## 2018-08-14 ENCOUNTER — Ambulatory Visit (HOSPITAL_COMMUNITY)
Admission: RE | Admit: 2018-08-14 | Discharge: 2018-08-14 | Disposition: A | Discharge status: Home / Self Care | Payer: PPO | Source: Ambulatory Visit | Attending: Internal Medicine | Admitting: Internal Medicine

## 2018-08-14 ENCOUNTER — Inpatient Hospital Stay: Payer: PPO | Attending: Internal Medicine

## 2018-08-14 DIAGNOSIS — C3431 Malignant neoplasm of lower lobe, right bronchus or lung: Secondary | ICD-10-CM | POA: Insufficient documentation

## 2018-08-14 DIAGNOSIS — I700 Atherosclerosis of aorta: Secondary | ICD-10-CM | POA: Insufficient documentation

## 2018-08-14 DIAGNOSIS — I517 Cardiomegaly: Secondary | ICD-10-CM | POA: Insufficient documentation

## 2018-08-14 DIAGNOSIS — R0609 Other forms of dyspnea: Secondary | ICD-10-CM | POA: Diagnosis not present

## 2018-08-14 DIAGNOSIS — I2510 Atherosclerotic heart disease of native coronary artery without angina pectoris: Secondary | ICD-10-CM | POA: Diagnosis not present

## 2018-08-14 DIAGNOSIS — C3490 Malignant neoplasm of unspecified part of unspecified bronchus or lung: Secondary | ICD-10-CM | POA: Insufficient documentation

## 2018-08-14 DIAGNOSIS — I77810 Thoracic aortic ectasia: Secondary | ICD-10-CM | POA: Diagnosis not present

## 2018-08-14 DIAGNOSIS — J948 Other specified pleural conditions: Secondary | ICD-10-CM | POA: Diagnosis not present

## 2018-08-14 DIAGNOSIS — C342 Malignant neoplasm of middle lobe, bronchus or lung: Secondary | ICD-10-CM | POA: Diagnosis not present

## 2018-08-14 DIAGNOSIS — R918 Other nonspecific abnormal finding of lung field: Secondary | ICD-10-CM

## 2018-08-14 DIAGNOSIS — Z79899 Other long term (current) drug therapy: Secondary | ICD-10-CM | POA: Diagnosis not present

## 2018-08-14 DIAGNOSIS — R05 Cough: Secondary | ICD-10-CM | POA: Insufficient documentation

## 2018-08-14 LAB — CBC WITH DIFFERENTIAL (CANCER CENTER ONLY)
Basophils Absolute: 0 10*3/uL (ref 0.0–0.1)
Basophils Relative: 1 %
Eosinophils Absolute: 0.4 10*3/uL (ref 0.0–0.5)
Eosinophils Relative: 4 %
HCT: 42.7 % (ref 38.4–49.9)
Hemoglobin: 14.1 g/dL (ref 13.0–17.1)
Lymphocytes Relative: 22 %
Lymphs Abs: 1.9 10*3/uL (ref 0.9–3.3)
MCH: 33.5 pg — ABNORMAL HIGH (ref 27.2–33.4)
MCHC: 33 g/dL (ref 32.0–36.0)
MCV: 101.4 fL — ABNORMAL HIGH (ref 79.3–98.0)
Monocytes Absolute: 0.9 10*3/uL (ref 0.1–0.9)
Monocytes Relative: 10 %
Neutro Abs: 5.6 10*3/uL (ref 1.5–6.5)
Neutrophils Relative %: 63 %
Platelet Count: 184 10*3/uL (ref 140–400)
RBC: 4.21 MIL/uL (ref 4.20–5.82)
RDW: 14.3 % (ref 11.0–14.6)
WBC Count: 8.7 10*3/uL (ref 4.0–10.3)

## 2018-08-14 LAB — CMP (CANCER CENTER ONLY)
ALT: 11 U/L (ref 0–44)
AST: 13 U/L — ABNORMAL LOW (ref 15–41)
Albumin: 4.2 g/dL (ref 3.5–5.0)
Alkaline Phosphatase: 84 U/L (ref 38–126)
Anion gap: 8 (ref 5–15)
BUN: 24 mg/dL — ABNORMAL HIGH (ref 8–23)
CO2: 25 mmol/L (ref 22–32)
Calcium: 9.9 mg/dL (ref 8.9–10.3)
Chloride: 107 mmol/L (ref 98–111)
Creatinine: 1.43 mg/dL — ABNORMAL HIGH (ref 0.61–1.24)
GFR, Est AFR Am: 56 mL/min — ABNORMAL LOW (ref >60)
GFR, Estimated: 48 mL/min — ABNORMAL LOW (ref >60)
Glucose, Bld: 118 mg/dL — ABNORMAL HIGH (ref 70–99)
Potassium: 4.8 mmol/L (ref 3.5–5.1)
Sodium: 140 mmol/L (ref 135–145)
Total Bilirubin: 0.6 mg/dL (ref 0.3–1.2)
Total Protein: 7.6 g/dL (ref 6.5–8.1)

## 2018-08-14 MED ORDER — IOHEXOL 300 MG/ML  SOLN
75.0000 mL | Freq: Once | INTRAMUSCULAR | Status: AC | PRN
  Administered 2018-08-14: 75 mL via INTRAVENOUS

## 2018-08-18 ENCOUNTER — Telehealth: Payer: Self-pay | Admitting: Internal Medicine

## 2018-08-18 ENCOUNTER — Encounter: Payer: Self-pay | Admitting: Internal Medicine

## 2018-08-18 ENCOUNTER — Inpatient Hospital Stay (HOSPITAL_BASED_OUTPATIENT_CLINIC_OR_DEPARTMENT_OTHER): Payer: PPO | Admitting: Internal Medicine

## 2018-08-18 DIAGNOSIS — C3490 Malignant neoplasm of unspecified part of unspecified bronchus or lung: Secondary | ICD-10-CM

## 2018-08-18 DIAGNOSIS — R0609 Other forms of dyspnea: Secondary | ICD-10-CM

## 2018-08-18 DIAGNOSIS — C342 Malignant neoplasm of middle lobe, bronchus or lung: Secondary | ICD-10-CM

## 2018-08-18 DIAGNOSIS — C3431 Malignant neoplasm of lower lobe, right bronchus or lung: Secondary | ICD-10-CM

## 2018-08-18 DIAGNOSIS — R05 Cough: Secondary | ICD-10-CM | POA: Diagnosis not present

## 2018-08-18 DIAGNOSIS — C3491 Malignant neoplasm of unspecified part of right bronchus or lung: Secondary | ICD-10-CM | POA: Insufficient documentation

## 2018-08-18 NOTE — Telephone Encounter (Signed)
Scheduled appt per 9/10 los - gave pt AVS and calender per los.

## 2018-08-18 NOTE — Progress Notes (Signed)
[place] [phone]   [phone]  OFFICE PROGRESS NOTE  [name], MD [place] [address]  DIAGNOSIS: Stage IB (T2a, N0, M0) non-small cell lung cancer, adenocarcinoma presented with right lower lobe lung mass  PRIOR THERAPY: status post right middle and lower bilobectomies with lymph node dissection under the care of Dr. Roxan Hockey on January 16, 2018.  CURRENT THERAPY: Observation.  INTERVAL HISTORY: Christian [name] 72 y.o. male returns to the clinic today for six-month follow-up visit.  The patient is feeling fine today with no specific complaints except for shortness of breath with exertion and mild cough.  He denied having any chest pain or hemoptysis.  He denied having any recent weight loss or night sweats.  He has no nausea, vomiting, diarrhea or constipation.  He has no headache or visual changes.  The patient had repeat CT scan of the chest performed recently and he is here for evaluation and discussion of his discuss results.  MEDICAL HISTORY: Past Medical History:  Diagnosis Date  . Anxiety   . Arthritis   . Cancer ([place])    skin  . Cancer ([place])    right lung  . Coronary artery disease   . Diabetes mellitus type 2 with complications ([place])    not on medication was "taken off of list"; pt states that he was pre diabetic  . Dyspnea    walks 100 yards then rest  . GERD (gastroesophageal reflux disease)    ocassional no meds; pt states that its no longer an issue (01/14/18)  . History of shingles    2004  . Hyperlipidemia   . Hypertension   . OSA on CPAP    pt states that he stopped wearing his CPAP  . Pneumonia 10/2017    ALLERGIES:  is allergic to celebrex [celecoxib].  MEDICATIONS:  Current Outpatient Medications  Medication Sig Dispense Refill  . albuterol (PROVENTIL HFA;VENTOLIN HFA) 108 (90 BASE) MCG/ACT inhaler Inhale 2 puffs into the lungs every 6 (six) hours as needed for wheezing  or shortness of breath. (Patient taking differently: Inhale 1 puff into the lungs every 6 (six) hours as needed for wheezing or shortness of breath. ) 1 Inhaler 3  . aspirin 325 MG tablet Take 325 mg by mouth daily.    . baclofen (LIORESAL) 20 MG tablet TAKE 1 TABLET (20 MG TOTAL) BY MOUTH 2 (TWO) TIMES DAILY AS NEEDED FOR MUSCLE SPASMS. 180 tablet 0  . clonazePAM (KLONOPIN) 0.5 MG tablet Take 1 tablet (0.5 mg total) by mouth at bedtime as needed. 30 tablet 1  . diphenhydrAMINE (BENADRYL) 25 mg capsule Take by mouth.    . doxycycline (VIBRA-TABS) 100 MG tablet Take 1 tablet (100 mg total) by mouth 2 (two) times daily. 14 tablet 0  . guaiFENesin (MUCINEX) 600 MG 12 hr tablet Take 2 tablets (1,200 mg total) by mouth 2 (two) times daily as needed for cough or to loosen phlegm.    Marland Kitchen ketoconazole (NIZORAL) 2 % cream Apply 1 application topically 2 (two) times daily as needed for irritation.    Marland Kitchen lisinopril (PRINIVIL,ZESTRIL) 10 MG tablet Take 1 tablet (10 mg total) by mouth daily. 90 tablet 3  . Melatonin 1 MG TABS Take by mouth.    . metoprolol succinate (TOPROL-XL) 50 MG 24 hr tablet TAKE 1 TABLET (50 MG TOTAL) BY MOUTH DAILY. TAKE WITH OR IMMEDIATELY FOLLOWING A MEAL. 90 tablet 3  .  nitroGLYCERIN (NITROSTAT) 0.4 MG SL tablet Place 1 tablet (0.4 mg total) under the tongue every 5 (five) minutes as needed for chest pain. 25 tablet 1  . simvastatin (ZOCOR) 40 MG tablet Take 1 tablet (40 mg total) by mouth at bedtime. 90 tablet 3  . tizanidine (ZANAFLEX) 2 MG capsule Take by mouth.    . vitamin E 100 UNIT capsule Take 100 Units by mouth daily.     No current facility-administered medications for this visit.     SURGICAL HISTORY:  Past Surgical History:  Procedure Laterality Date  . CARDIAC CATHETERIZATION     2010 @ [facility] (per pt)  . COLONOSCOPY W/ POLYPECTOMY    . EYE SURGERY Bilateral    cataracts  . OPEN REDUCTION INTERNAL FIXATION (ORIF) DISTAL RADIAL FRACTURE Right 11/26/2013    Procedure: OPEN REDUCTION INTERNAL FIXATION (ORIF) DISTAL RADIAL FRACTURE;  Surgeon: Newt Minion, MD;  Location: [place];  Service: Orthopedics;  Laterality: Right;  Open Reduction Internal Fixation Right Distal Radius   . VIDEO ASSISTED THORACOSCOPY (VATS)/ LOBECTOMY Right 01/16/2018   Procedure: VIDEO ASSISTED THORACOSCOPY (VATS)/RIGHT MIDDLE AND LOWER LOBE LUNG LOBECTOMY WITH NODE BIOPSIES x6;  Surgeon: Melrose Nakayama, MD;  Location: [place];  Service: Thoracic;  Laterality: Right;  Marland Kitchen VIDEO BRONCHOSCOPY WITH ENDOBRONCHIAL NAVIGATION Right 12/15/2017   Procedure: VIDEO BRONCHOSCOPY WITH ENDOBRONCHIAL NAVIGATION;  Surgeon: Melrose Nakayama, MD;  Location: [place];  Service: Thoracic;  Laterality: Right;  Marland Kitchen VIDEO BRONCHOSCOPY WITH ENDOBRONCHIAL ULTRASOUND N/A 12/15/2017   Procedure: VIDEO BRONCHOSCOPY WITH ENDOBRONCHIAL ULTRASOUND;  Surgeon: Melrose Nakayama, MD;  Location: [facility];  Service: Thoracic;  Laterality: N/A;    REVIEW OF SYSTEMS:  A comprehensive review of systems was negative except for: Respiratory: positive for cough and dyspnea on exertion   PHYSICAL EXAMINATION: General appearance: alert, cooperative, fatigued and no distress Head: Normocephalic, without obvious abnormality, atraumatic Neck: no adenopathy, no JVD, supple, symmetrical, trachea midline and thyroid not enlarged, symmetric, no tenderness/mass/nodules Lymph nodes: Cervical, supraclavicular, and axillary nodes normal. Resp: clear to auscultation bilaterally Back: symmetric, no curvature. ROM normal. No CVA tenderness. Cardio: regular rate and rhythm, S1, S2 normal, no murmur, click, rub or gallop GI: soft, non-tender; bowel sounds normal; no masses,  no organomegaly Extremities: extremities normal, atraumatic, no cyanosis or edema  ECOG PERFORMANCE STATUS: 1 - Symptomatic but completely ambulatory  Blood pressure (!) 115/91, pulse 70, temperature 98.1 F (36.7 C), temperature source Oral, resp. rate (!) 22,  height 6' (1.829 m), weight 260 lb 11.2 oz (118.3 kg), SpO2 98 %.  LABORATORY DATA: Lab Results  Component Value Date   WBC 8.7 08/14/2018   HGB 14.1 08/14/2018   HCT 42.7 08/14/2018   MCV 101.4 (H) 08/14/2018   PLT 184 08/14/2018      Chemistry      Component Value Date/Time   NA 140 08/14/2018 1319   K 4.8 08/14/2018 1319   CL 107 08/14/2018 1319   CO2 25 08/14/2018 1319   BUN 24 (H) 08/14/2018 1319   CREATININE 1.43 (H) 08/14/2018 1319   CREATININE 0.99 07/11/2016 1448      Component Value Date/Time   CALCIUM 9.9 08/14/2018 1319   ALKPHOS 84 08/14/2018 1319   AST 13 (L) 08/14/2018 1319   ALT 11 08/14/2018 1319   BILITOT 0.6 08/14/2018 1319       RADIOGRAPHIC STUDIES: Ct Chest W Contrast  Result Date: 08/14/2018 CLINICAL DATA:  Shortness of breath. Lung cancer diagnosed in  February 2019. Prior right lung surgery. EXAM: CT CHEST WITH CONTRAST TECHNIQUE: Multidetector CT imaging of the chest was performed during intravenous contrast administration. CONTRAST:  74mL OMNIPAQUE IOHEXOL 300 MG/ML  SOLN COMPARISON:  PET-CT from 01/19/2017 FINDINGS: Cardiovascular: Coronary, aortic arch, and branch vessel atherosclerotic vascular disease. Mild cardiomegaly. My measurement of the ascending thoracic aorta is 3.8 cm today, which is ectatic. Mediastinum/Nodes: Small axillary and mediastinal lymph nodes are not pathologically enlarged by size criteria. Lungs/Pleura: There has been right lower lobectomy and right middle lobectomy. Associated volume loss in the right hemithorax with a small right pleural effusion with thickening or enhancement along the pleural margins at the right lung base. No worrisome lesions in the left lung or in the right upper lobe. Upper Abdomen: Hypodense 1 cm lesion in the dome of the left hepatic lobe on image 111/2 is unchanged from the 11/11/2017 exam and was not hypermetabolic on the intervening PET-CT. We partially image a left renal cyst. Musculoskeletal: Old  healed right upper rib fractures. Thoracic spondylosis. Mild chronic wedging of the superior endplate of L1. IMPRESSION: 1. No compelling evidence of recurrent malignancy. The patient has had right lower lobectomy and right middle lobectomy, and there is some pleural fluid on the right side along with mild pleural thickening along the lung base, but no obvious nodularity. 2. Aortic Atherosclerosis (ICD10-I70.0). Coronary atherosclerosis and mild cardiomegaly. 3. 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 Electronically Signed   By: Van Clines M.D.   On: 08/14/2018 20:40    ASSESSMENT AND PLAN: This is a very pleasant 72 years old white male with a stage Ib non-small cell lung cancer, adenocarcinoma status post right middle and lower bilobectomies with lymph node dissection under the care of Dr. Roxan Hockey on January 16, 2018.  The patient is currently on observation and he is feeling fine. He had repeat CT scan of the chest performed recently. I personally and independently reviewed the scan images and discussed the result with the patient today. His a scan showed no concerning findings for disease recurrence. I recommended for the patient to continue on observation with repeat CT scan of the chest in 6 months. The patient was advised to call immediately if he has any concerning symptoms in the interval. The patient voices understanding of current disease status and treatment options and is in agreement with the current care plan.  All questions were answered. The patient knows to call the clinic with any problems, questions or concerns. We can certainly see the patient much sooner if necessary.  I spent 10 minutes counseling the patient face to face. The total time spent in the appointment was 15 minutes.  Disclaimer: This note was dictated with voice recognition software. Similar sounding words can inadvertently be transcribed and may not be corrected upon review.

## 2018-08-28 ENCOUNTER — Ambulatory Visit (INDEPENDENT_AMBULATORY_CARE_PROVIDER_SITE_OTHER): Payer: PPO | Admitting: Family Medicine

## 2018-08-28 ENCOUNTER — Encounter: Payer: Self-pay | Admitting: Family Medicine

## 2018-08-28 ENCOUNTER — Other Ambulatory Visit: Payer: Self-pay | Admitting: Family Medicine

## 2018-08-28 ENCOUNTER — Ambulatory Visit (INDEPENDENT_AMBULATORY_CARE_PROVIDER_SITE_OTHER): Payer: PPO

## 2018-08-28 VITALS — BP 122/84 | HR 58 | Temp 98.1°F | Ht 71.5 in | Wt 257.8 lb

## 2018-08-28 DIAGNOSIS — Z23 Encounter for immunization: Secondary | ICD-10-CM

## 2018-08-28 DIAGNOSIS — E118 Type 2 diabetes mellitus with unspecified complications: Secondary | ICD-10-CM

## 2018-08-28 DIAGNOSIS — Z9989 Dependence on other enabling machines and devices: Secondary | ICD-10-CM

## 2018-08-28 DIAGNOSIS — I10 Essential (primary) hypertension: Secondary | ICD-10-CM

## 2018-08-28 DIAGNOSIS — C3491 Malignant neoplasm of unspecified part of right bronchus or lung: Secondary | ICD-10-CM

## 2018-08-28 DIAGNOSIS — G4700 Insomnia, unspecified: Secondary | ICD-10-CM

## 2018-08-28 DIAGNOSIS — Z202 Contact with and (suspected) exposure to infections with a predominantly sexual mode of transmission: Secondary | ICD-10-CM

## 2018-08-28 DIAGNOSIS — Z0000 Encounter for general adult medical examination without abnormal findings: Secondary | ICD-10-CM | POA: Diagnosis not present

## 2018-08-28 DIAGNOSIS — Z7189 Other specified counseling: Secondary | ICD-10-CM

## 2018-08-28 DIAGNOSIS — M545 Low back pain: Secondary | ICD-10-CM

## 2018-08-28 DIAGNOSIS — G8929 Other chronic pain: Secondary | ICD-10-CM

## 2018-08-28 DIAGNOSIS — G4733 Obstructive sleep apnea (adult) (pediatric): Secondary | ICD-10-CM | POA: Diagnosis not present

## 2018-08-28 DIAGNOSIS — E785 Hyperlipidemia, unspecified: Secondary | ICD-10-CM | POA: Diagnosis not present

## 2018-08-28 LAB — BASIC METABOLIC PANEL
BUN: 16 mg/dL (ref 6–23)
CO2: 28 mEq/L (ref 19–32)
Calcium: 9.5 mg/dL (ref 8.4–10.5)
Chloride: 104 mEq/L (ref 96–112)
Creatinine, Ser: 1.26 mg/dL (ref 0.40–1.50)
GFR: 60.13 mL/min (ref >60.00)
Glucose, Bld: 121 mg/dL — ABNORMAL HIGH (ref 70–99)
Potassium: 4.3 mEq/L (ref 3.5–5.1)
Sodium: 138 mEq/L (ref 135–145)

## 2018-08-28 LAB — LIPID PANEL
Cholesterol: 100 mg/dL (ref 0–200)
HDL: 32 mg/dL — ABNORMAL LOW (ref >39.00)
LDL Cholesterol: 53 mg/dL (ref 0–99)
NonHDL: 67.54
Total CHOL/HDL Ratio: 3
Triglycerides: 75 mg/dL (ref 0.0–149.0)
VLDL: 15 mg/dL (ref 0.0–40.0)

## 2018-08-28 LAB — HEMOGLOBIN A1C: Hgb A1c MFr Bld: 6.6 % — ABNORMAL HIGH (ref 4.6–6.5)

## 2018-08-28 MED ORDER — CLONAZEPAM 0.5 MG PO TABS: 0.5000 mg | ORAL_TABLET | Freq: Every evening | ORAL | 2 refills | Status: DC | PRN

## 2018-08-28 MED ORDER — LISINOPRIL 10 MG PO TABS: 10.0000 mg | ORAL_TABLET | Freq: Every day | ORAL | 3 refills | Status: DC

## 2018-08-28 MED ORDER — SIMVASTATIN 40 MG PO TABS: 40.0000 mg | ORAL_TABLET | Freq: Every day | ORAL | 3 refills | Status: DC

## 2018-08-28 NOTE — Progress Notes (Signed)
Lung cancer s/p surgery w/o current tx, on observation with f/u imaging to be done later on per onc. D/w pt.  No cough, no sputum usually but occ clear sputum.  Using SABA rarely.  Not smoking.    Hypertension:    Using medication without problems or lightheadedness: yes Chest pain with exertion:no Edema:no Short of breath: at baseline with exertion, no change since prior to his surgery  Insomnia.  He slept better with klonopin.  Didn't sleep well w/o it.  No ADE.   Compliant.    Elevated Cholesterol: Using medications without problems: yes Muscle aches: no Diet compliance: encouraged.   Exercise: encouraged as tolerated.    H/o DM2 vs hyperglycemia.  Labs pending.  See notes on labs, d/w pt about low carb diet.    Muscle spasms.  Baclofen helped.  No ADE on med.  Used prn.    OSA on CPAP, compliant.  With relief.  Used "from when I go to bed until when I wake up."  He sleeps better with it.    PNA and flu shot today Tetanus 2019 Colon 2010, d/w pt.  He wanted to defer colonoscopy at this point.  I asked/encouraged him to consider.   Defer PSA testing at this point given age and h/o lung cancer.   Advance directive d/w pt. Would have his son, daughter and brother equally designated if patient were incapacitated.   He has some L 4th and 5th DIP pain that prevents full strong grip.  He is putting up with that.    No burning with urination, no discharge.  STD labs pending.  He wanted reassurance via lab check.  See notes on labs.    PMH and SH reviewed  ROS: Per HPI unless specifically indicated in ROS section   Meds, vitals, and allergies reviewed.   GEN: nad, alert and oriented HEENT: mucous membranes moist NECK: supple w/o LA CV: rrr PULM: ctab, no inc wob ABD: soft, +bs EXT: no edema SKIN: no acute rash L 4th and 5th DIP chronic IP changes that prevent full strong grip.

## 2018-08-28 NOTE — Patient Instructions (Signed)
Don't change your meds for now.  Take care.  Glad to see you.  We'll contact you with your lab report. Update me as needed.

## 2018-08-28 NOTE — Progress Notes (Signed)
PCP notes:   Health maintenance:  Foot exam - PCP follow-up needed A1C - completed PPSV23 - administered Flu vaccine - administered Eye exam - addressed  Abnormal screenings:   Hearing - failed  Hearing Screening   125Hz  250Hz  500Hz  1000Hz  2000Hz  3000Hz  4000Hz  6000Hz  8000Hz   Right ear:   40 40 40  0    Left ear:   40 40 40  0      Visual Acuity Screening   Right eye Left eye Both eyes  Without correction: 20/25 20/30-1 20/20  With correction:      Patient concerns:   Medication refill request for Klonopin  Nurse concerns:  None  Next PCP appt:   08/28/18 @ 1045  I reviewed health advisor's note, was available for consultation on the day of service listed in this note, and agree with documentation and plan. Elsie Stain, MD.

## 2018-08-28 NOTE — Progress Notes (Signed)
Subjective:   Christian [name] is a 72 y.o. male who presents for Medicare Annual/Subsequent preventive examination.  Review of Systems:  N/A Cardiac Risk Factors include: advanced age (>30men, >51 women);smoking/ tobacco exposure;male gender;obesity (BMI >30kg/m2);diabetes mellitus;dyslipidemia;hypertension     Objective:    Vitals: BP 122/84 (BP Location: Right Arm, Patient Position: Sitting, Cuff Size: Normal)   Pulse (!) 58   Temp 98.1 F (36.7 C) (Oral)   Ht 5' 11.5" (1.816 m) Comment: no shoes  Wt 257 lb 12 oz (116.9 kg)   SpO2 97%   BMI 35.45 kg/m   Body mass index is 35.45 kg/m.  Advanced Directives 08/28/2018 02/12/2018 01/16/2018 01/14/2018 12/15/2017 12/12/2017 07/30/2017  Does Patient Have a Medical Advance Directive? No No No No No No No  Type of Advance Directive - - - - - - -  Does patient want to make changes to medical advance directive? - - - - - - -  Would patient like information on creating a medical advance directive? No - Patient declined - No - Patient declined No - Patient declined No - Patient declined Yes (MAU/Ambulatory/Procedural Areas - Information given) -  Pre-existing out of facility DNR order (yellow form or pink MOST form) - - - - - - -    Tobacco Social History   Tobacco Use  Smoking Status Former Smoker  . Packs/day: 1.00  . Years: 51.00  . Pack years: 51.00  . Types: Cigarettes, E-cigarettes  . Last attempt to quit: 01/15/2018  . Years since quitting: 0.6  Smokeless Tobacco Never Used  Tobacco Comment   pt "considering" stopping     Counseling given: No Comment: pt "considering" stopping   Clinical Intake:  Pre-visit preparation completed: Yes  Pain : No/denies pain Pain Score: 0-No pain     Nutritional Status: BMI > 30  Obese Nutritional Risks: None Diabetes: Yes CBG done?: No Did pt. bring in CBG monitor from home?: No  How often do you need to have someone help you when you read instructions, pamphlets, or other written  materials from your doctor or pharmacy?: 1 - Never What is the last grade level you completed in school?: Associate degree  Interpreter Needed?: No  Comments: pt is a widower and lives alone Information entered by :: LPinson, LPN  Past Medical History:  Diagnosis Date  . Anxiety   . Arthritis   . Cancer ([place])    skin  . Cancer ([place])    right lung  . Coronary artery disease   . Diabetes mellitus type 2 with complications ([place])    not on medication was "taken off of list"; pt states that he was pre diabetic  . Dyspnea    walks 100 yards then rest  . GERD (gastroesophageal reflux disease)    ocassional no meds; pt states that its no longer an issue (01/14/18)  . History of shingles    2004  . Hyperlipidemia   . Hypertension   . OSA on CPAP    pt states that he stopped wearing his CPAP  . Pneumonia 10/2017   Past Surgical History:  Procedure Laterality Date  . CARDIAC CATHETERIZATION     2010 @ [facility] (per pt)  . COLONOSCOPY W/ POLYPECTOMY    . EYE SURGERY Bilateral    cataracts  . OPEN REDUCTION INTERNAL FIXATION (ORIF) DISTAL RADIAL FRACTURE Right 11/26/2013   Procedure: OPEN REDUCTION INTERNAL FIXATION (ORIF) DISTAL RADIAL FRACTURE;  Surgeon: Newt Minion, MD;  Location:  [facility];  Service: Orthopedics;  Laterality: Right;  Open Reduction Internal Fixation Right Distal Radius   . VIDEO ASSISTED THORACOSCOPY (VATS)/ LOBECTOMY Right 01/16/2018   Procedure: VIDEO ASSISTED THORACOSCOPY (VATS)/RIGHT MIDDLE AND LOWER LOBE LUNG LOBECTOMY WITH NODE BIOPSIES x6;  Surgeon: Melrose Nakayama, MD;  Location: [facility];  Service: Thoracic;  Laterality: Right;  Marland Kitchen VIDEO BRONCHOSCOPY WITH ENDOBRONCHIAL NAVIGATION Right 12/15/2017   Procedure: VIDEO BRONCHOSCOPY WITH ENDOBRONCHIAL NAVIGATION;  Surgeon: Melrose Nakayama, MD;  Location: [facility];  Service: Thoracic;  Laterality: Right;  Marland Kitchen VIDEO BRONCHOSCOPY WITH ENDOBRONCHIAL ULTRASOUND N/A 12/15/2017   Procedure: VIDEO BRONCHOSCOPY WITH  ENDOBRONCHIAL ULTRASOUND;  Surgeon: Melrose Nakayama, MD;  Location: [facility];  Service: Thoracic;  Laterality: N/A;   Family History  Problem Relation Age of Onset  . Heart disease Mother   . Heart disease Father   . Colon cancer Father        possible colon or prostate cancer, dx'd in his 97s, patient wasn't sure of source.   . Prostate cancer Father        possible colon or prostate cancer, dx'd in his 65s, patient wasn't sure of source.   Marland Kitchen Heart disease Brother   . Diabetes Brother   . Kidney disease Brother   . Heart disease Sister   . Cancer Sister    Social History   Socioeconomic History  . Marital status: Widowed    Spouse name: Not on file  . Number of children: Not on file  . Years of education: Not on file  . Highest education level: Not on file  Occupational History  . Not on file  Social Needs  . Financial resource strain: Not on file  . Food insecurity:    Worry: Not on file    Inability: Not on file  . Transportation needs:    Medical: Not on file    Non-medical: Not on file  Tobacco Use  . Smoking status: Former Smoker    Packs/day: 1.00    Years: 51.00    Pack years: 51.00    Types: Cigarettes, E-cigarettes    Last attempt to quit: 01/15/2018    Years since quitting: 0.6  . Smokeless tobacco: Never Used  . Tobacco comment: pt "considering" stopping  Substance and Sexual Activity  . Alcohol use: Yes    Alcohol/week: 7.0 standard drinks    Types: 7 Glasses of wine per week    Comment: 1 glass of wine per night-   . Drug use: No  . Sexual activity: Not on file  Lifestyle  . Physical activity:    Days per week: Not on file    Minutes per session: Not on file  . Stress: Not on file  Relationships  . Social connections:    Talks on phone: Not on file    Gets together: Not on file    Attends religious service: Not on file    Active member of club or organization: Not on file    Attends meetings of clubs or organizations: Not on file     Relationship status: Not on file  Other Topics Concern  . 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 albuterol (PROVENTIL HFA;VENTOLIN  HFA) 108 (90 BASE) MCG/ACT inhaler Inhale 2 puffs into the lungs every 6 (six) hours as needed for wheezing or shortness of breath. (Patient taking differently: Inhale 1 puff into the lungs every 6 (six) hours as needed for wheezing or shortness of breath. )  . aspirin 325 MG tablet Take 325 mg by mouth daily.  . baclofen (LIORESAL) 20 MG tablet TAKE 1 TABLET (20 MG TOTAL) BY MOUTH 2 (TWO) TIMES DAILY AS NEEDED FOR MUSCLE SPASMS.  . clonazePAM (KLONOPIN) 0.5 MG tablet Take 1 tablet (0.5 mg total) by mouth at bedtime as needed.  . diphenhydrAMINE (BENADRYL) 25 mg capsule Take by mouth.  Marland Kitchen guaiFENesin (MUCINEX) 600 MG 12 hr tablet Take 2 tablets (1,200 mg total) by mouth 2 (two) times daily as needed for cough or to loosen phlegm.  Marland Kitchen ketoconazole (NIZORAL) 2 % cream Apply 1 application topically 2 (two) times daily as needed for irritation.  Marland Kitchen lisinopril (PRINIVIL,ZESTRIL) 10 MG tablet Take 1 tablet (10 mg total) by mouth daily.  . Melatonin 1 MG TABS Take by mouth.  . metoprolol succinate (TOPROL-XL) 50 MG 24 hr tablet TAKE 1 TABLET (50 MG TOTAL) BY MOUTH DAILY. TAKE WITH OR IMMEDIATELY FOLLOWING A MEAL.  . nitroGLYCERIN (NITROSTAT) 0.4 MG SL tablet Place 1 tablet (0.4 mg total) under the tongue every 5 (five) minutes as needed for chest pain.  . simvastatin (ZOCOR) 40 MG tablet Take 1 tablet (40 mg total) by mouth at bedtime.  . tizanidine (ZANAFLEX) 2 MG capsule Take by mouth.  . vitamin E 100 UNIT capsule Take 100 Units by mouth daily.  . [DISCONTINUED] doxycycline (VIBRA-TABS) 100 MG tablet Take 1 tablet (100 mg total) by mouth 2  (two) times daily.   No facility-administered encounter medications on file as of 08/28/2018.     Activities of Daily Living In your present state of health, do you have any difficulty performing the following activities: 08/28/2018 01/16/2018  Hearing? Y N  Vision? N N  Difficulty concentrating or making decisions? N N  Walking or climbing stairs? Y N  Dressing or bathing? N N  Doing errands, shopping? N N  Preparing Food and eating ? N -  Using the Toilet? N -  In the past six months, have you accidently leaked urine? N -  Do you have problems with loss of bowel control? N -  Managing your Medications? N -  Managing your Finances? N -  Housekeeping or managing your Housekeeping? N -  Some recent data might be hidden    Patient Care Team: [name], MD as PCP - General (Family Medicine) Sherryl Barters, MD as Referring Physician (Cardiology)   Assessment:   This is a routine wellness examination for Rayquan.   Hearing Screening   125Hz  250Hz  500Hz  1000Hz  2000Hz  3000Hz  4000Hz  6000Hz  8000Hz   Right ear:   40 40 40  0    Left ear:   40 40 40  0      Visual Acuity Screening   Right eye Left eye Both eyes  Without correction: 20/25 20/30-1 20/20  With correction:       Exercise Activities and Dietary recommendations Current Exercise Habits: The patient does not participate in regular exercise at present, Exercise limited by: None identified  Goals    . Increase water intake     Starting 08/28/2018, I will continue to take medications as prescribed.        Fall Risk Fall Risk  08/28/2018 07/30/2017 07/11/2016  07/28/2014  Falls in the past year? No No No Yes  Number falls in past yr: - - - 1  Injury with Fall? - - - Yes   Depression Screen PHQ 2/9 Scores 08/28/2018 07/30/2017 07/11/2016 07/28/2014  PHQ - 2 Score 0 0 0 0  PHQ- 9 Score 0 1 - -    Cognitive Function MMSE - Mini Mental State Exam 08/28/2018 07/30/2017  Orientation to time 5 5  Orientation to Place 5 5    Registration 3 3  Attention/ Calculation 0 0  Recall 3 2  Recall-comments - pt was unable to recall 1 of 3 words  Language- name 2 objects 0 0  Language- repeat 1 1  Language- follow 3 step command 3 2  Language- follow 3 step command-comments - pt was unable to follow 1 step of 3 step command  Language- read & follow direction 0 0  Write a sentence 0 0  Copy design 0 0  Total score 20 18     PLEASE NOTE: A Mini-Cog screen was completed. Maximum score is 20. A value of 0 denotes this part of Folstein MMSE was not completed or the patient failed this part of the Mini-Cog screening.   Mini-Cog Screening Orientation to Time - Max 5 pts Orientation to Place - Max 5 pts Registration - Max 3 pts Recall - Max 3 pts Language Repeat - Max 1 pts Language Follow 3 Step Command - Max 3 pts     Immunization History  Administered Date(s) Administered  . DTaP 12/14/2009  . Influenza Split 12/14/2009, 11/17/2014  . Influenza,inj,Quad PF,6+ Mos 08/28/2018  . Influenza-Unspecified 09/25/2016  . Pneumococcal Conjugate-13 07/30/2017  . Pneumococcal Polysaccharide-23 06/02/2013, 08/28/2018  . Td 05/11/2018    Screening Tests Health Maintenance  Topic Date Due  . OPHTHALMOLOGY EXAM  12/09/2019 (Originally 08/25/1956)  . HEMOGLOBIN A1C  02/26/2019  . FOOT EXAM  08/29/2019  . COLONOSCOPY  11/08/2019  . TETANUS/TDAP  05/11/2028  . INFLUENZA VACCINE  Completed  . Hepatitis C Screening  Completed  . PNA vac Low Risk Adult  Completed       Plan:      I have personally reviewed, addressed, and noted the following in the patient's chart:  A. Medical and social history B. Use of alcohol, tobacco or illicit drugs  C. Current medications and supplements D. Functional ability and status E.  Nutritional status F.  Physical activity G. Advance directives H. List of other physicians I.  Hospitalizations, surgeries, and ER visits in previous 12 months J.  [place] to include  hearing, vision, cognitive, depression L. Referrals and appointments - none  In addition, I have reviewed and discussed with patient certain preventive protocols, quality metrics, and best practice recommendations. A written personalized care plan for preventive services as well as general preventive health recommendations were provided to patient.  See attached scanned questionnaire for additional information.   Signed,   Lindell Noe, MHA, BS, LPN Health Coach

## 2018-08-28 NOTE — Patient Instructions (Signed)
Christian [name] , Thank you for taking time to come for your Medicare Wellness Visit. I appreciate your ongoing commitment to your health goals. Please review the following plan we discussed and let me know if I can assist you in the future.   These are the goals we discussed: Goals    . Increase water intake     Starting 08/28/2018, I will continue to take medications as prescribed.        This is a list of the screening recommended for you and due dates:  Health Maintenance  Topic Date Due  . Eye exam for diabetics  12/09/2019*  . Hemoglobin A1C  02/26/2019  . Complete foot exam   08/29/2019  . Colon Cancer Screening  11/08/2019  . Tetanus Vaccine  05/11/2028  . Flu Shot  Completed  .  Hepatitis C: One time screening is recommended by Center for Disease Control  (CDC) for  adults born from [birth year] through 1963.   Completed  . Pneumonia vaccines  Completed  *Topic was postponed. The date shown is not the original due date.   Preventive Care for Adults  A healthy lifestyle and preventive care can promote health and wellness. Preventive health guidelines for adults include the following key practices.  . A routine yearly physical is a good way to check with your health care provider about your health and preventive screening. It is a chance to share any concerns and updates on your health and to receive a thorough exam.  . Visit your dentist for a routine exam and preventive care every 6 months. Brush your teeth twice a day and floss once a day. Good oral hygiene prevents tooth decay and gum disease.  . The frequency of eye exams is based on your age, health, family medical history, use  of contact lenses, and other factors. Follow your health care provider's recommendations for frequency of eye exams.  . Eat a healthy diet. Foods like vegetables, fruits, whole grains, low-fat dairy products, and lean protein foods contain the nutrients you need without too many calories. Decrease your  intake of foods high in solid fats, added sugars, and salt. Eat the right amount of calories for you. Get information about a proper diet from your health care provider, if necessary.  . Regular physical exercise is one of the most important things you can do for your health. Most adults should get at least 150 minutes of moderate-intensity exercise (any activity that increases your heart rate and causes you to sweat) each week. In addition, most adults need muscle-strengthening exercises on 2 or more days a week.  Silver Sneakers may be a benefit available to you. To determine eligibility, you may visit the website: www.silversneakers.com or contact program at (845)434-0047 Mon-Fri between 8AM-8PM.   . Maintain a healthy weight. The body mass index (BMI) is a screening tool to identify possible weight problems. It provides an estimate of body fat based on height and weight. Your health care provider can find your BMI and can help you achieve or maintain a healthy weight.   For adults 20 years and older: ? A BMI below 18.5 is considered underweight. ? A BMI of 18.5 to 24.9 is normal. ? A BMI of 25 to 29.9 is considered overweight. ? A BMI of 30 and above is considered obese.   . Maintain normal blood lipids and cholesterol levels by exercising and minimizing your intake of saturated fat. Eat a balanced diet with plenty of fruit and vegetables.  Blood tests for lipids and cholesterol should begin at age 33 and be repeated every 5 years. If your lipid or cholesterol levels are high, you are over 50, or you are at high risk for heart disease, you may need your cholesterol levels checked more frequently. Ongoing high lipid and cholesterol levels should be treated with medicines if diet and exercise are not working.  . If you smoke, find out from your health care provider how to quit. If you do not use tobacco, please do not start.  . If you choose to drink alcohol, please do not consume more than 2  drinks per day. One drink is considered to be 12 ounces (355 mL) of beer, 5 ounces (148 mL) of wine, or 1.5 ounces (44 mL) of liquor.  . If you are 67-50 years old, ask your health care provider if you should take aspirin to prevent strokes.  . Use sunscreen. Apply sunscreen liberally and repeatedly throughout the day. You should seek shade when your shadow is shorter than you. Protect yourself by wearing long sleeves, pants, a wide-brimmed hat, and sunglasses year round, whenever you are outdoors.  . Once a month, do a whole body skin exam, using a mirror to look at the skin on your back. Tell your health care provider of new moles, moles that have irregular borders, moles that are larger than a pencil eraser, or moles that have changed in shape or color.

## 2018-08-30 NOTE — Assessment & Plan Note (Signed)
Advance directive d/w pt. Would have his son, daughter and brother equally designated if patient were incapacitated.

## 2018-08-30 NOTE — Assessment & Plan Note (Signed)
No meds at this point.  See notes on labs.  Discussed with patient about low-carb diet.

## 2018-08-30 NOTE — Assessment & Plan Note (Signed)
Discussed with patient about diet and exercise.  No change in statin at this point.  See notes on labs.  No adverse effect on medication.

## 2018-08-30 NOTE — Assessment & Plan Note (Signed)
Lung cancer s/p surgery w/o current tx, on observation with f/u imaging to be done later on per onc. D/w pt.  No cough, no sputum usually but occ clear sputum.  Using SABA rarely.  Not smoking.

## 2018-08-30 NOTE — Assessment & Plan Note (Signed)
With history of muscle spasms.  Continue as needed baclofen.

## 2018-08-30 NOTE — Assessment & Plan Note (Signed)
Controlled with Klonopin at night.  Did not sleep well without medication.  No adverse effect.  Continue as is.  He agrees.

## 2018-08-30 NOTE — Assessment & Plan Note (Signed)
Compliant.  Used every night.  Continue as is.  See above.

## 2018-08-30 NOTE — Assessment & Plan Note (Signed)
PNA and flu shot today Tetanus 2019 Colon 2010, d/w pt.  He wanted to defer colonoscopy at this point.  I asked/encouraged him to consider.   Defer PSA testing at this point given age and h/o lung cancer.   Advance directive d/w pt. Would have his son, daughter and brother equally designated if patient were incapacitated.

## 2018-08-30 NOTE — Assessment & Plan Note (Signed)
Reasonable control.  No change in meds. See notes on labs.  D/w pt.  He agrees.

## 2018-08-31 LAB — RPR: RPR Ser Ql: NONREACTIVE

## 2018-08-31 LAB — HIV ANTIBODY (ROUTINE TESTING W REFLEX): HIV 1&2 Ab, 4th Generation: NONREACTIVE

## 2018-08-31 LAB — C. TRACHOMATIS/N. GONORRHOEAE RNA
C. trachomatis RNA, TMA: NOT DETECTED
N. gonorrhoeae RNA, TMA: NOT DETECTED

## 2018-11-08 ENCOUNTER — Encounter: Payer: Self-pay | Admitting: Family Medicine

## 2018-11-08 DIAGNOSIS — I779 Disorder of arteries and arterioles, unspecified: Secondary | ICD-10-CM | POA: Insufficient documentation

## 2018-11-11 ENCOUNTER — Telehealth: Payer: Self-pay | Admitting: Family Medicine

## 2018-11-11 NOTE — Telephone Encounter (Signed)
Left message asking pt to call office  Message  Received: 2 days ago  Message Contents  Modena Nunnery, CMA  Ramond Craver B      Previous Messages    ----- Message -----  From: [name], MD  Sent: 11/08/2018  1:33 PM EST  To: Modena Nunnery, CMA   Needs DM2 f/u in 12/2018 with A1c at office visit. Nonfasting.  Please schedule. Thanks.  Brigitte Pulse

## 2018-11-16 NOTE — Telephone Encounter (Signed)
Left message asking pt to call office  °

## 2018-11-19 NOTE — Telephone Encounter (Signed)
Left message asking pt to call office  °

## 2018-11-20 ENCOUNTER — Encounter: Payer: Self-pay | Admitting: Family Medicine

## 2018-11-20 NOTE — Telephone Encounter (Signed)
Mailed letter °

## 2019-01-15 ENCOUNTER — Other Ambulatory Visit: Payer: Self-pay

## 2019-01-15 DIAGNOSIS — G4700 Insomnia, unspecified: Secondary | ICD-10-CM

## 2019-01-15 NOTE — Telephone Encounter (Signed)
Name of Medication: Clonazepam Name of Pharmacy: [facility] or Written Date and Quantity: 08/28/18, #30 Last Office Visit and Type: 08/28/18, CPE Next Office Visit and Type: 09/06/19, CPE Last Controlled Substance Agreement Date: none Last UDS: none  Baclofen Last rx:  07/17/18, #180

## 2019-01-17 MED ORDER — BACLOFEN 20 MG PO TABS: 20.0000 mg | ORAL_TABLET | Freq: Two times a day (BID) | ORAL | 0 refills | Status: DC | PRN

## 2019-01-17 MED ORDER — CLONAZEPAM 0.5 MG PO TABS: 0.5000 mg | ORAL_TABLET | Freq: Every evening | ORAL | 2 refills | Status: DC | PRN

## 2019-01-17 NOTE — Telephone Encounter (Signed)
Sent. Thanks.   

## 2019-02-08 DIAGNOSIS — G4733 Obstructive sleep apnea (adult) (pediatric): Secondary | ICD-10-CM | POA: Diagnosis not present

## 2019-02-11 ENCOUNTER — Other Ambulatory Visit: Payer: Self-pay | Admitting: Medical Oncology

## 2019-02-11 DIAGNOSIS — C3490 Malignant neoplasm of unspecified part of unspecified bronchus or lung: Secondary | ICD-10-CM

## 2019-02-12 ENCOUNTER — Encounter (HOSPITAL_COMMUNITY): Payer: Self-pay

## 2019-02-12 ENCOUNTER — Inpatient Hospital Stay: Payer: PPO | Attending: Internal Medicine

## 2019-02-12 ENCOUNTER — Ambulatory Visit (HOSPITAL_COMMUNITY)
Admission: RE | Admit: 2019-02-12 | Discharge: 2019-02-12 | Disposition: A | Discharge status: Home / Self Care | Payer: PPO | Source: Ambulatory Visit | Attending: Internal Medicine | Admitting: Internal Medicine

## 2019-02-12 DIAGNOSIS — E279 Disorder of adrenal gland, unspecified: Secondary | ICD-10-CM | POA: Insufficient documentation

## 2019-02-12 DIAGNOSIS — C342 Malignant neoplasm of middle lobe, bronchus or lung: Secondary | ICD-10-CM | POA: Diagnosis not present

## 2019-02-12 DIAGNOSIS — C3490 Malignant neoplasm of unspecified part of unspecified bronchus or lung: Secondary | ICD-10-CM

## 2019-02-12 DIAGNOSIS — C3431 Malignant neoplasm of lower lobe, right bronchus or lung: Secondary | ICD-10-CM | POA: Diagnosis not present

## 2019-02-12 LAB — COMPREHENSIVE METABOLIC PANEL
ALT: 24 U/L (ref 0–44)
AST: 20 U/L (ref 15–41)
Albumin: 4.4 g/dL (ref 3.5–5.0)
Alkaline Phosphatase: 56 U/L (ref 38–126)
Anion gap: 5 (ref 5–15)
BUN: 17 mg/dL (ref 8–23)
CO2: 25 mmol/L (ref 22–32)
Calcium: 9.2 mg/dL (ref 8.9–10.3)
Chloride: 110 mmol/L (ref 98–111)
Creatinine, Ser: 1.17 mg/dL (ref 0.61–1.24)
GFR calc Af Amer: >60 mL/min (ref >60)
GFR calc non Af Amer: >60 mL/min (ref >60)
Glucose, Bld: 156 mg/dL — ABNORMAL HIGH (ref 70–99)
Potassium: 4.3 mmol/L (ref 3.5–5.1)
Sodium: 140 mmol/L (ref 135–145)
Total Bilirubin: 0.5 mg/dL (ref 0.3–1.2)
Total Protein: 7.2 g/dL (ref 6.5–8.1)

## 2019-02-12 LAB — CBC WITH DIFFERENTIAL (CANCER CENTER ONLY)
Abs Immature Granulocytes: 0.03 10*3/uL (ref 0.00–0.07)
Basophils Absolute: 0.1 10*3/uL (ref 0.0–0.1)
Basophils Relative: 1 %
Eosinophils Absolute: 0.4 10*3/uL (ref 0.0–0.5)
Eosinophils Relative: 5 %
HCT: 39.6 % (ref 39.0–52.0)
Hemoglobin: 12.7 g/dL — ABNORMAL LOW (ref 13.0–17.0)
Immature Granulocytes: 0 %
Lymphocytes Relative: 25 %
Lymphs Abs: 1.9 10*3/uL (ref 0.7–4.0)
MCH: 33.7 pg (ref 26.0–34.0)
MCHC: 32.1 g/dL (ref 30.0–36.0)
MCV: 105 fL — ABNORMAL HIGH (ref 80.0–100.0)
Monocytes Absolute: 0.8 10*3/uL (ref 0.1–1.0)
Monocytes Relative: 11 %
Neutro Abs: 4.3 10*3/uL (ref 1.7–7.7)
Neutrophils Relative %: 58 %
Platelet Count: 161 10*3/uL (ref 150–400)
RBC: 3.77 MIL/uL — ABNORMAL LOW (ref 4.22–5.81)
RDW: 13.5 % (ref 11.5–15.5)
WBC Count: 7.5 10*3/uL (ref 4.0–10.5)
nRBC: 0.3 % — ABNORMAL HIGH (ref 0.0–0.2)

## 2019-02-12 MED ORDER — SODIUM CHLORIDE (PF) 0.9 % IJ SOLN
INTRAMUSCULAR | Status: AC
  Filled 2019-02-12: qty 50

## 2019-02-12 MED ORDER — IOHEXOL 300 MG/ML  SOLN
75.0000 mL | Freq: Once | INTRAMUSCULAR | Status: AC | PRN
  Administered 2019-02-12: 75 mL via INTRAVENOUS

## 2019-02-15 ENCOUNTER — Encounter: Payer: Self-pay | Admitting: Internal Medicine

## 2019-02-15 ENCOUNTER — Inpatient Hospital Stay (HOSPITAL_BASED_OUTPATIENT_CLINIC_OR_DEPARTMENT_OTHER): Payer: PPO | Admitting: Internal Medicine

## 2019-02-15 ENCOUNTER — Telehealth: Payer: Self-pay | Admitting: Internal Medicine

## 2019-02-15 VITALS — BP 137/61 | HR 78 | Temp 98.7°F | Resp 18 | Ht 71.0 in | Wt 275.7 lb

## 2019-02-15 DIAGNOSIS — R0609 Other forms of dyspnea: Secondary | ICD-10-CM

## 2019-02-15 DIAGNOSIS — C3491 Malignant neoplasm of unspecified part of right bronchus or lung: Secondary | ICD-10-CM

## 2019-02-15 DIAGNOSIS — C3431 Malignant neoplasm of lower lobe, right bronchus or lung: Secondary | ICD-10-CM | POA: Diagnosis not present

## 2019-02-15 DIAGNOSIS — E279 Disorder of adrenal gland, unspecified: Secondary | ICD-10-CM | POA: Diagnosis not present

## 2019-02-15 DIAGNOSIS — I10 Essential (primary) hypertension: Secondary | ICD-10-CM

## 2019-02-15 DIAGNOSIS — C342 Malignant neoplasm of middle lobe, bronchus or lung: Secondary | ICD-10-CM | POA: Diagnosis not present

## 2019-02-15 DIAGNOSIS — D3500 Benign neoplasm of unspecified adrenal gland: Secondary | ICD-10-CM

## 2019-02-15 DIAGNOSIS — E278 Other specified disorders of adrenal gland: Secondary | ICD-10-CM

## 2019-02-15 NOTE — Telephone Encounter (Signed)
Scheduled appt per 3/09 los - gave patient AVS and calender per los.

## 2019-02-15 NOTE — Progress Notes (Signed)
[place] [phone]   Fax:(336) [phone]  OFFICE PROGRESS NOTE  [name], MD [place] [address]  DIAGNOSIS: Stage IB (T2a, N0, M0) non-small cell lung cancer, adenocarcinoma presented with right lower lobe lung mass  PRIOR THERAPY: status post right middle and lower bilobectomies with lymph node dissection under the care of Dr. Roxan Hockey on January 16, 2018.  CURRENT THERAPY: Observation.  INTERVAL HISTORY: Christian [name] 73 y.o. male returns to the clinic today for 6 months follow-up visit.  The patient is feeling fine today with no concerning complaints except for shortness of breath with exertion.  He gained over 20 pounds in the last few months.  He denied having any chest pain, cough or hemoptysis.  He denied having any nausea, vomiting, diarrhea or constipation.  He has no headache or visual changes.  He had repeat CT scan of the chest performed recently and he is here for evaluation and discussion of his scan results.  MEDICAL HISTORY: Past Medical History:  Diagnosis Date  . Anxiety   . Arthritis   . Cancer ([place])    skin  . Cancer ([place])    right lung  . Coronary artery disease   . Diabetes mellitus type 2 with complications ([place])    not on medication was "taken off of list"; pt states that he was pre diabetic  . Dyspnea    walks 100 yards then rest  . GERD (gastroesophageal reflux disease)    ocassional no meds; pt states that its no longer an issue (01/14/18)  . History of shingles    2004  . Hyperlipidemia   . Hypertension   . OSA on CPAP    pt states that he stopped wearing his CPAP  . Pneumonia 10/2017    ALLERGIES:  is allergic to celebrex [celecoxib].  MEDICATIONS:  Current Outpatient Medications  Medication Sig Dispense Refill  . albuterol (PROVENTIL HFA;VENTOLIN HFA) 108 (90 BASE) MCG/ACT inhaler Inhale 2 puffs into the lungs every 6 (six) hours as needed for wheezing or shortness of  breath. (Patient taking differently: Inhale 1 puff into the lungs every 6 (six) hours as needed for wheezing or shortness of breath. ) 1 Inhaler 3  . aspirin 325 MG tablet Take 325 mg by mouth daily.    . baclofen (LIORESAL) 20 MG tablet Take 1 tablet (20 mg total) by mouth 2 (two) times daily as needed for muscle spasms. 180 tablet 0  . clonazePAM (KLONOPIN) 0.5 MG tablet Take 1 tablet (0.5 mg total) by mouth at bedtime as needed. 30 tablet 2  . diphenhydrAMINE (BENADRYL) 25 mg capsule Take by mouth.    Marland Kitchen guaiFENesin (MUCINEX) 600 MG 12 hr tablet Take 2 tablets (1,200 mg total) by mouth 2 (two) times daily as needed for cough or to loosen phlegm.    Marland Kitchen ketoconazole (NIZORAL) 2 % cream Apply 1 application topically 2 (two) times daily as needed for irritation.    Marland Kitchen lisinopril (PRINIVIL,ZESTRIL) 10 MG tablet Take 1 tablet (10 mg total) by mouth daily. 90 tablet 3  . Melatonin 1 MG TABS Take by mouth.    . metoprolol succinate (TOPROL-XL) 50 MG 24 hr tablet TAKE 1 TABLET (50 MG TOTAL) BY MOUTH DAILY. TAKE WITH OR IMMEDIATELY FOLLOWING A MEAL. 90 tablet 3  . nitroGLYCERIN (NITROSTAT) 0.4 MG SL tablet Place 1 tablet (0.4 mg total) under the tongue every 5 (five) minutes as needed for chest  pain. 25 tablet 1  . simvastatin (ZOCOR) 40 MG tablet Take 1 tablet (40 mg total) by mouth at bedtime. 90 tablet 3  . vitamin E 100 UNIT capsule Take 100 Units by mouth daily.     No current facility-administered medications for this visit.     SURGICAL HISTORY:  Past Surgical History:  Procedure Laterality Date  . CARDIAC CATHETERIZATION     2010 @ [facility] (per pt)  . COLONOSCOPY W/ POLYPECTOMY    . EYE SURGERY Bilateral    cataracts  . OPEN REDUCTION INTERNAL FIXATION (ORIF) DISTAL RADIAL FRACTURE Right 11/26/2013   Procedure: OPEN REDUCTION INTERNAL FIXATION (ORIF) DISTAL RADIAL FRACTURE;  Surgeon: Newt Minion, MD;  Location: [facility];  Service: Orthopedics;  Laterality: Right;  Open Reduction  Internal Fixation Right Distal Radius   . VIDEO ASSISTED THORACOSCOPY (VATS)/ LOBECTOMY Right 01/16/2018   Procedure: VIDEO ASSISTED THORACOSCOPY (VATS)/RIGHT MIDDLE AND LOWER LOBE LUNG LOBECTOMY WITH NODE BIOPSIES x6;  Surgeon: Melrose Nakayama, MD;  Location: [facility];  Service: Thoracic;  Laterality: Right;  Marland Kitchen VIDEO BRONCHOSCOPY WITH ENDOBRONCHIAL NAVIGATION Right 12/15/2017   Procedure: VIDEO BRONCHOSCOPY WITH ENDOBRONCHIAL NAVIGATION;  Surgeon: Melrose Nakayama, MD;  Location: [facility];  Service: Thoracic;  Laterality: Right;  Marland Kitchen VIDEO BRONCHOSCOPY WITH ENDOBRONCHIAL ULTRASOUND N/A 12/15/2017   Procedure: VIDEO BRONCHOSCOPY WITH ENDOBRONCHIAL ULTRASOUND;  Surgeon: Melrose Nakayama, MD;  Location[facility];  Service: Thoracic;  Laterality: N/A;    REVIEW OF SYSTEMS:  A comprehensive review of systems was negative except for: Respiratory: positive for dyspnea on exertion   PHYSICAL EXAMINATION: General appearance: alert, cooperative and no distress Head: Normocephalic, without obvious abnormality, atraumatic Neck: no adenopathy, no JVD, supple, symmetrical, trachea midline and thyroid not enlarged, symmetric, no tenderness/mass/nodules Lymph nodes: Cervical, supraclavicular, and axillary nodes normal. Resp: clear to auscultation bilaterally Back: symmetric, no curvature. ROM normal. No CVA tenderness. Cardio: regular rate and rhythm, S1, S2 normal, no murmur, click, rub or gallop GI: soft, non-tender; bowel sounds normal; no masses,  no organomegaly Extremities: extremities normal, atraumatic, no cyanosis or edema  ECOG PERFORMANCE STATUS: 1 - Symptomatic but completely ambulatory  Blood pressure 137/61, pulse 78, temperature 98.7 F (37.1 C), temperature source Oral, resp. rate 18, height 5\' 11"  (1.803 m), weight 275 lb 11.2 oz (125.1 kg), SpO2 96 %.  LABORATORY DATA: Lab Results  Component Value Date   WBC 7.5 02/12/2019   HGB 12.7 (L) 02/12/2019   HCT 39.6 02/12/2019   MCV 105.0  (H) 02/12/2019   PLT 161 02/12/2019      Chemistry      Component Value Date/Time   NA 140 02/12/2019 0912   K 4.3 02/12/2019 0912   CL 110 02/12/2019 0912   CO2 25 02/12/2019 0912   BUN 17 02/12/2019 0912   CREATININE 1.17 02/12/2019 0912   CREATININE 1.43 (H) 08/14/2018 1319   CREATININE 0.99 07/11/2016 1448      Component Value Date/Time   CALCIUM 9.2 02/12/2019 0912   ALKPHOS 56 02/12/2019 0912   AST 20 02/12/2019 0912   AST 13 (L) 08/14/2018 1319   ALT 24 02/12/2019 0912   ALT 11 08/14/2018 1319   BILITOT 0.5 02/12/2019 0912   BILITOT 0.6 08/14/2018 1319       RADIOGRAPHIC STUDIES: Ct Chest W Contrast  Result Date: 02/12/2019 CLINICAL DATA:  Lung cancer. Status post right lower lobectomy. Chronic shortness of breath. EXAM: CT CHEST WITH CONTRAST TECHNIQUE: Multidetector CT imaging of the  chest was performed during intravenous contrast administration. CONTRAST:  38mL OMNIPAQUE IOHEXOL 300 MG/ML  SOLN COMPARISON:  08/14/2018 FINDINGS: Cardiovascular: Heart size appears within normal limits. Aortic atherosclerosis. Calcifications in the RCA, LAD, left circumflex coronary arteries identified. Mediastinum/Nodes: Normal appearance of the thyroid gland. The trachea appears patent and is midline. Normal appearance of the esophagus. No mediastinal or hilar adenopathy identified. No axillary or supraclavicular adenopathy. Lungs/Pleura: Small loculated right pleural effusion overlying the right base is again identified and appears unchanged from previous exam. Postop change from right middle and lower lobectomy with no dissection identified. Emphysema. No airspace consolidation, atelectasis or pneumothorax. 2 mm left upper lobe lung nodule is unchanged, image 63/7. Calcified granuloma within the anterior right midlung. Upper Abdomen: Stable 9 mm low-density structure along the dome of liver. There is a left adrenal mass measuring 4.7 x 2.3 cm, image 156/8. Previously this measured 4.0 by 1.5  cm. This is a new nodule when compared with 11/11/2017. Musculoskeletal: No chest wall abnormality. No acute or significant osseous findings. IMPRESSION: 1. Stable appearance of the chest status post right middle and lower lobectomy. No specific findings noted to suggest local tumor recurrence. 2. There is a nodule within the left adrenal gland which has increased in size from 08/14/2018 and is new from 11/11/2017. Suspicious for metastatic disease. 3. Aortic Atherosclerosis (ICD10-I70.0) and Emphysema (ICD10-J43.9). 4. Multi vessel coronary artery calcifications. Electronically Signed   By: Kerby Moors M.D.   On: 02/12/2019 13:19    ASSESSMENT AND PLAN: This is a very pleasant 73 years old white male with a stage Ib non-small cell lung cancer, adenocarcinoma status post right middle and lower bilobectomies with lymph node dissection under the care of Dr. Roxan Hockey on January 16, 2018.   The patient is currently on observation and he is feeling fine with no concerning complaints. Repeat CT scan of the chest showed no concerning findings for disease recurrence in the lung but there was suspicious increase in the left adrenal gland nodule. I personally and independently reviewed the scan images and discussed the results with the patient. I recommended for him to have CT scan of the abdomen and pelvis in 3 months for further evaluation of the left adrenal gland lesion and to rule out metastatic disease. He was advised to call immediately if he has any concerning symptoms in the interval. The patient voices understanding of current disease status and treatment options and is in agreement with the current care plan. All questions were answered. The patient knows to call the clinic with any problems, questions or concerns. We can certainly see the patient much sooner if necessary.  I spent 10 minutes counseling the patient face to face. The total time spent in the appointment was 15  minutes.  Disclaimer: This note was dictated with voice recognition software. Similar sounding words can inadvertently be transcribed and may not be corrected upon review.

## 2019-04-13 ENCOUNTER — Other Ambulatory Visit: Payer: Self-pay | Admitting: Family Medicine

## 2019-04-13 NOTE — Telephone Encounter (Signed)
Last filled 01-18-19 #180 Last OV 08-28-18 Next OV 09-06-19 [facility] [address]

## 2019-04-14 NOTE — Telephone Encounter (Signed)
Sent. Thanks.   

## 2019-05-14 ENCOUNTER — Other Ambulatory Visit: Payer: Self-pay

## 2019-05-14 ENCOUNTER — Encounter (HOSPITAL_COMMUNITY): Payer: Self-pay

## 2019-05-14 ENCOUNTER — Inpatient Hospital Stay: Payer: PPO | Attending: Internal Medicine

## 2019-05-14 ENCOUNTER — Ambulatory Visit (HOSPITAL_COMMUNITY)
Admission: RE | Admit: 2019-05-14 | Discharge: 2019-05-14 | Disposition: A | Discharge status: Home / Self Care | Payer: PPO | Source: Ambulatory Visit | Attending: Internal Medicine | Admitting: Internal Medicine

## 2019-05-14 DIAGNOSIS — C342 Malignant neoplasm of middle lobe, bronchus or lung: Secondary | ICD-10-CM | POA: Diagnosis not present

## 2019-05-14 DIAGNOSIS — N400 Enlarged prostate without lower urinary tract symptoms: Secondary | ICD-10-CM | POA: Diagnosis not present

## 2019-05-14 DIAGNOSIS — C3491 Malignant neoplasm of unspecified part of right bronchus or lung: Secondary | ICD-10-CM

## 2019-05-14 DIAGNOSIS — C7972 Secondary malignant neoplasm of left adrenal gland: Secondary | ICD-10-CM | POA: Insufficient documentation

## 2019-05-14 DIAGNOSIS — E279 Disorder of adrenal gland, unspecified: Secondary | ICD-10-CM | POA: Diagnosis not present

## 2019-05-14 DIAGNOSIS — C3431 Malignant neoplasm of lower lobe, right bronchus or lung: Secondary | ICD-10-CM | POA: Diagnosis not present

## 2019-05-14 DIAGNOSIS — K760 Fatty (change of) liver, not elsewhere classified: Secondary | ICD-10-CM | POA: Diagnosis not present

## 2019-05-14 DIAGNOSIS — K5730 Diverticulosis of large intestine without perforation or abscess without bleeding: Secondary | ICD-10-CM | POA: Diagnosis not present

## 2019-05-14 LAB — CBC WITH DIFFERENTIAL (CANCER CENTER ONLY)
Abs Immature Granulocytes: 0.02 10*3/uL (ref 0.00–0.07)
Basophils Absolute: 0.1 10*3/uL (ref 0.0–0.1)
Basophils Relative: 1 %
Eosinophils Absolute: 0.3 10*3/uL (ref 0.0–0.5)
Eosinophils Relative: 5 %
HCT: 36.8 % — ABNORMAL LOW (ref 39.0–52.0)
Hemoglobin: 12.1 g/dL — ABNORMAL LOW (ref 13.0–17.0)
Immature Granulocytes: 0 %
Lymphocytes Relative: 26 %
Lymphs Abs: 1.8 10*3/uL (ref 0.7–4.0)
MCH: 34.6 pg — ABNORMAL HIGH (ref 26.0–34.0)
MCHC: 32.9 g/dL (ref 30.0–36.0)
MCV: 105.1 fL — ABNORMAL HIGH (ref 80.0–100.0)
Monocytes Absolute: 0.7 10*3/uL (ref 0.1–1.0)
Monocytes Relative: 10 %
Neutro Abs: 4.1 10*3/uL (ref 1.7–7.7)
Neutrophils Relative %: 58 %
Platelet Count: 168 10*3/uL (ref 150–400)
RBC: 3.5 MIL/uL — ABNORMAL LOW (ref 4.22–5.81)
RDW: 14.1 % (ref 11.5–15.5)
WBC Count: 7.1 10*3/uL (ref 4.0–10.5)
nRBC: 0.4 % — ABNORMAL HIGH (ref 0.0–0.2)

## 2019-05-14 LAB — CMP (CANCER CENTER ONLY)
ALT: 26 U/L (ref 0–44)
AST: 21 U/L (ref 15–41)
Albumin: 4.1 g/dL (ref 3.5–5.0)
Alkaline Phosphatase: 64 U/L (ref 38–126)
Anion gap: 9 (ref 5–15)
BUN: 23 mg/dL (ref 8–23)
CO2: 21 mmol/L — ABNORMAL LOW (ref 22–32)
Calcium: 9.3 mg/dL (ref 8.9–10.3)
Chloride: 110 mmol/L (ref 98–111)
Creatinine: 1.24 mg/dL (ref 0.61–1.24)
GFR, Est AFR Am: >60 mL/min (ref >60)
GFR, Estimated: 59 mL/min — ABNORMAL LOW (ref >60)
Glucose, Bld: 151 mg/dL — ABNORMAL HIGH (ref 70–99)
Potassium: 4.4 mmol/L (ref 3.5–5.1)
Sodium: 140 mmol/L (ref 135–145)
Total Bilirubin: 0.3 mg/dL (ref 0.3–1.2)
Total Protein: 7 g/dL (ref 6.5–8.1)

## 2019-05-14 MED ORDER — IOHEXOL 300 MG/ML  SOLN
100.0000 mL | Freq: Once | INTRAMUSCULAR | Status: AC | PRN
  Administered 2019-05-14: 100 mL via INTRAVENOUS

## 2019-05-14 MED ORDER — SODIUM CHLORIDE (PF) 0.9 % IJ SOLN
INTRAMUSCULAR | Status: AC
  Filled 2019-05-14: qty 50

## 2019-05-14 MED ORDER — IOHEXOL 300 MG/ML  SOLN
30.0000 mL | Freq: Once | INTRAMUSCULAR | Status: AC | PRN
  Administered 2019-05-14: 30 mL via ORAL

## 2019-05-17 ENCOUNTER — Inpatient Hospital Stay (HOSPITAL_BASED_OUTPATIENT_CLINIC_OR_DEPARTMENT_OTHER): Payer: PPO | Admitting: Internal Medicine

## 2019-05-17 ENCOUNTER — Encounter: Payer: Self-pay | Admitting: Internal Medicine

## 2019-05-17 ENCOUNTER — Other Ambulatory Visit: Payer: Self-pay

## 2019-05-17 VITALS — BP 133/64 | HR 64 | Temp 98.2°F | Resp 17 | Ht 71.0 in | Wt 278.5 lb

## 2019-05-17 DIAGNOSIS — E279 Disorder of adrenal gland, unspecified: Secondary | ICD-10-CM

## 2019-05-17 DIAGNOSIS — C3491 Malignant neoplasm of unspecified part of right bronchus or lung: Secondary | ICD-10-CM

## 2019-05-17 DIAGNOSIS — C342 Malignant neoplasm of middle lobe, bronchus or lung: Secondary | ICD-10-CM

## 2019-05-17 DIAGNOSIS — C3431 Malignant neoplasm of lower lobe, right bronchus or lung: Secondary | ICD-10-CM | POA: Diagnosis not present

## 2019-05-17 DIAGNOSIS — Z902 Acquired absence of lung [part of]: Secondary | ICD-10-CM

## 2019-05-17 DIAGNOSIS — C3490 Malignant neoplasm of unspecified part of unspecified bronchus or lung: Secondary | ICD-10-CM

## 2019-05-17 NOTE — Progress Notes (Signed)
[place] [phone]   Fax:(336) [phone]  OFFICE PROGRESS NOTE  [name], MD [place] [address]  DIAGNOSIS: Stage IB (T2a, N0, M0) non-small cell lung cancer, adenocarcinoma presented with right lower lobe lung mass  PRIOR THERAPY: status post right middle and lower bilobectomies with lymph node dissection under the care of Dr. Roxan Hockey on January 16, 2018.  CURRENT THERAPY: Observation.  INTERVAL HISTORY: Christian [name] 73 y.o. male returns to the clinic today for follow-up visit.  The patient is feeling fine today with no concerning complaints except for shortness of breath with exertion.  He denied having any chest pain, cough or hemoptysis.  He denied having any fever or chills.  He has no nausea, vomiting, diarrhea or constipation.  He denied having any recent weight loss or night sweats.  He has no headache or visual changes.  The patient had repeat CT scan of the chest, abdomen and pelvis performed recently and he is here today for evaluation and discussion of his scan results.  MEDICAL HISTORY: Past Medical History:  Diagnosis Date  . Anxiety   . Arthritis   . Cancer ([place])    skin  . Cancer ([place])    right lung  . Coronary artery disease   . Diabetes mellitus type 2 with complications ([place])    not on medication was "taken off of list"; pt states that he was pre diabetic  . Dyspnea    walks 100 yards then rest  . GERD (gastroesophageal reflux disease)    ocassional no meds; pt states that its no longer an issue (01/14/18)  . History of shingles    2004  . Hyperlipidemia   . Hypertension   . OSA on CPAP    pt states that he stopped wearing his CPAP  . Pneumonia 10/2017    ALLERGIES:  is allergic to celebrex [celecoxib].  MEDICATIONS:  Current Outpatient Medications  Medication Sig Dispense Refill  . albuterol (PROVENTIL HFA;VENTOLIN HFA) 108 (90 BASE) MCG/ACT inhaler Inhale 2 puffs into the  lungs every 6 (six) hours as needed for wheezing or shortness of breath. (Patient taking differently: Inhale 1 puff into the lungs every 6 (six) hours as needed for wheezing or shortness of breath. ) 1 Inhaler 3  . aspirin 325 MG tablet Take 325 mg by mouth daily.    . baclofen (LIORESAL) 20 MG tablet TAKE 1 TABLET (20 MG TOTAL) BY MOUTH 2 (TWO) TIMES DAILY AS NEEDED FOR MUSCLE SPASMS. 180 tablet 0  . clonazePAM (KLONOPIN) 0.5 MG tablet Take 1 tablet (0.5 mg total) by mouth at bedtime as needed. 30 tablet 2  . diphenhydrAMINE (BENADRYL) 25 mg capsule Take by mouth.    Marland Kitchen guaiFENesin (MUCINEX) 600 MG 12 hr tablet Take 2 tablets (1,200 mg total) by mouth 2 (two) times daily as needed for cough or to loosen phlegm.    Marland Kitchen ketoconazole (NIZORAL) 2 % cream Apply 1 application topically 2 (two) times daily as needed for irritation.    Marland Kitchen lisinopril (PRINIVIL,ZESTRIL) 10 MG tablet Take 1 tablet (10 mg total) by mouth daily. 90 tablet 3  . Melatonin 1 MG TABS Take by mouth.    . metoprolol succinate (TOPROL-XL) 50 MG 24 hr tablet TAKE 1 TABLET (50 MG TOTAL) BY MOUTH DAILY. TAKE WITH OR IMMEDIATELY FOLLOWING A MEAL. 90 tablet 3  . nitroGLYCERIN (NITROSTAT) 0.4 MG SL tablet Place 1 tablet (0.4 mg total) under  the tongue every 5 (five) minutes as needed for chest pain. 25 tablet 1  . simvastatin (ZOCOR) 40 MG tablet Take 1 tablet (40 mg total) by mouth at bedtime. 90 tablet 3  . vitamin E 100 UNIT capsule Take 100 Units by mouth daily.     No current facility-administered medications for this visit.     SURGICAL HISTORY:  Past Surgical History:  Procedure Laterality Date  . CARDIAC CATHETERIZATION     2010 @ [facility] (per pt)  . COLONOSCOPY W/ POLYPECTOMY    . EYE SURGERY Bilateral    cataracts  . OPEN REDUCTION INTERNAL FIXATION (ORIF) DISTAL RADIAL FRACTURE Right 11/26/2013   Procedure: OPEN REDUCTION INTERNAL FIXATION (ORIF) DISTAL RADIAL FRACTURE;  Surgeon: Newt Minion, MD;  Location: [facility];   Service: Orthopedics;  Laterality: Right;  Open Reduction Internal Fixation Right Distal Radius   . VIDEO ASSISTED THORACOSCOPY (VATS)/ LOBECTOMY Right 01/16/2018   Procedure: VIDEO ASSISTED THORACOSCOPY (VATS)/RIGHT MIDDLE AND LOWER LOBE LUNG LOBECTOMY WITH NODE BIOPSIES x6;  Surgeon: Melrose Nakayama, MD;  Location: [facility];  Service: Thoracic;  Laterality: Right;  Marland Kitchen VIDEO BRONCHOSCOPY WITH ENDOBRONCHIAL NAVIGATION Right 12/15/2017   Procedure: VIDEO BRONCHOSCOPY WITH ENDOBRONCHIAL NAVIGATION;  Surgeon: Melrose Nakayama, MD;  Location: [facility];  Service: Thoracic;  Laterality: Right;  Marland Kitchen VIDEO BRONCHOSCOPY WITH ENDOBRONCHIAL ULTRASOUND N/A 12/15/2017   Procedure: VIDEO BRONCHOSCOPY WITH ENDOBRONCHIAL ULTRASOUND;  Surgeon: Melrose Nakayama, MD;  Location: [facility];  Service: Thoracic;  Laterality: N/A;    REVIEW OF SYSTEMS:  Constitutional: positive for fatigue Eyes: negative Ears, nose, mouth, throat, and face: negative Respiratory: positive for dyspnea on exertion Cardiovascular: negative Gastrointestinal: negative Genitourinary:negative Integument/breast: negative Hematologic/lymphatic: negative Musculoskeletal:negative Neurological: negative Behavioral/Psych: negative Endocrine: negative Allergic/Immunologic: negative   PHYSICAL EXAMINATION: General appearance: alert, cooperative and no distress Head: Normocephalic, without obvious abnormality, atraumatic Neck: no adenopathy, no JVD, supple, symmetrical, trachea midline and thyroid not enlarged, symmetric, no tenderness/mass/nodules Lymph nodes: Cervical, supraclavicular, and axillary nodes normal. Resp: clear to auscultation bilaterally Back: symmetric, no curvature. ROM normal. No CVA tenderness. Cardio: regular rate and rhythm, S1, S2 normal, no murmur, click, rub or gallop GI: soft, non-tender; bowel sounds normal; no masses,  no organomegaly Extremities: extremities normal, atraumatic, no cyanosis or edema Neurologic:  Alert and oriented X 3, normal strength and tone. Normal symmetric reflexes. Normal coordination and gait  ECOG PERFORMANCE STATUS: 1 - Symptomatic but completely ambulatory  Blood pressure 133/64, pulse 64, temperature 98.2 F (36.8 C), temperature source Oral, resp. rate 17, height 5\' 11"  (1.803 m), weight 278 lb 8 oz (126.3 kg), SpO2 100 %.  LABORATORY DATA: Lab Results  Component Value Date   WBC 7.1 05/14/2019   HGB 12.1 (L) 05/14/2019   HCT 36.8 (L) 05/14/2019   MCV 105.1 (H) 05/14/2019   PLT 168 05/14/2019      Chemistry      Component Value Date/Time   NA 140 05/14/2019 1026   K 4.4 05/14/2019 1026   CL 110 05/14/2019 1026   CO2 21 (L) 05/14/2019 1026   BUN 23 05/14/2019 1026   CREATININE 1.24 05/14/2019 1026   CREATININE 0.99 07/11/2016 1448      Component Value Date/Time   CALCIUM 9.3 05/14/2019 1026   ALKPHOS 64 05/14/2019 1026   AST 21 05/14/2019 1026   ALT 26 05/14/2019 1026   BILITOT 0.3 05/14/2019 1026       RADIOGRAPHIC STUDIES: Ct Abdomen Pelvis W Contrast  Result Date:  05/14/2019 CLINICAL DATA:  Left adrenal mass. Right lung carcinoma. EXAM: CT ABDOMEN AND PELVIS WITH CONTRAST TECHNIQUE: Multidetector CT imaging of the abdomen and pelvis was performed using the standard protocol following bolus administration of intravenous contrast. CONTRAST:  172mL OMNIPAQUE IOHEXOL 300 MG/ML  SOLN COMPARISON:  Chest CT on 02/12/2019, and PET-CT on 11/18/2017 FINDINGS: Lower Chest: No acute findings. Hepatobiliary: Mild hepatic steatosis. Stable sub-cm cyst in anterior liver dome. No liver masses identified. Gallbladder is unremarkable. Pancreas:  No mass or inflammatory changes. Spleen: Within normal limits in size and appearance. Adrenals/Urinary Tract: Left adrenal mass is seen measuring 4.8 x 2.9 cm. This shows mild increase in size from 4.7 x 2.3 cm on most recent CT, and is new since 2018 PET-CT. The right adrenal gland and kidney are normal in appearance.  Benign-appearing left renal cyst again noted. No evidence ureteral calculi or hydronephrosis. Unremarkable unopacified urinary bladder. Stomach/Bowel: No evidence of obstruction, inflammatory process or abnormal fluid collections. Normal appendix visualized. Diverticulosis is seen mainly involving the descending and sigmoid colon, however there is no evidence of diverticulitis. Vascular/Lymphatic: No pathologically enlarged lymph nodes. No abdominal aortic aneurysm. Aortic atherosclerosis. Reproductive:  Mildly enlarged prostate. Other:  None. Musculoskeletal: No suspicious bone lesions identified. Old L1 and L4 vertebral body compression fractures noted IMPRESSION: 1. Mild increase in size of 4.8 cm left adrenal mass, highly suspicious for adrenal metastasis. 2. No other sites of metastatic disease identified within the abdomen or pelvis. 3. Colonic diverticulosis. No radiographic evidence of diverticulitis. 4. Mild hepatic steatosis. 5. Mildly enlarged prostate. Aortic Atherosclerosis (ICD10-I70.0). Electronically Signed   By: Earle Gell M.D.   On: 05/14/2019 13:38    ASSESSMENT AND PLAN: This is a very pleasant 73 years old white male with a stage IB non-small cell lung cancer, adenocarcinoma status post right middle and lower bilobectomies with lymph node dissection under the care of Dr. Roxan Hockey on January 16, 2018.   The patient is currently on observation and he is feeling fine with no concerning complaints. He had repeat CT scan of the chest, abdomen pelvis performed recently.  I personally and independently reviewed the scan images and discussed the results with the patient today. His a scan showed no concerning findings for disease recurrence in the chest but there was increase in the size of the left adrenal mass suspicious for adrenal metastasis. I recommended for the patient to have a PET scan for further evaluation of his disease and to rule out the presence of metastatic disease. I will  see the patient back for follow-up visit in 2 weeks for evaluation and more discussion of his treatment options based on the PET scan results. The patient was advised to call immediately if he has any concerning symptoms in the interval. The patient voices understanding of current disease status and treatment options and is in agreement with the current care plan. All questions were answered. The patient knows to call the clinic with any problems, questions or concerns. We can certainly see the patient much sooner if necessary.  Disclaimer: This note was dictated with voice recognition software. Similar sounding words can inadvertently be transcribed and may not be corrected upon review.

## 2019-05-19 ENCOUNTER — Telehealth: Payer: Self-pay | Admitting: Internal Medicine

## 2019-05-19 NOTE — Telephone Encounter (Signed)
Scheduled appt per 6/08 los - spoke with patient . They are aware of appt date and time

## 2019-05-24 ENCOUNTER — Encounter (HOSPITAL_COMMUNITY)
Admission: RE | Admit: 2019-05-24 | Discharge: 2019-05-24 | Disposition: A | Discharge status: Home / Self Care | Payer: PPO | Source: Ambulatory Visit | Attending: Internal Medicine | Admitting: Internal Medicine

## 2019-05-24 ENCOUNTER — Other Ambulatory Visit: Payer: Self-pay

## 2019-05-24 DIAGNOSIS — C3490 Malignant neoplasm of unspecified part of unspecified bronchus or lung: Secondary | ICD-10-CM | POA: Insufficient documentation

## 2019-05-24 DIAGNOSIS — Z79899 Other long term (current) drug therapy: Secondary | ICD-10-CM | POA: Diagnosis not present

## 2019-05-24 DIAGNOSIS — E279 Disorder of adrenal gland, unspecified: Secondary | ICD-10-CM | POA: Diagnosis not present

## 2019-05-24 LAB — GLUCOSE, CAPILLARY: Glucose-Capillary: 119 mg/dL — ABNORMAL HIGH (ref 70–99)

## 2019-05-24 MED ORDER — FLUDEOXYGLUCOSE F - 18 (FDG) INJECTION
13.9000 | Freq: Once | INTRAVENOUS | Status: AC | PRN
  Administered 2019-05-24: 13.9 via INTRAVENOUS

## 2019-06-01 ENCOUNTER — Other Ambulatory Visit: Payer: Self-pay

## 2019-06-01 ENCOUNTER — Encounter: Payer: Self-pay | Admitting: *Deleted

## 2019-06-01 ENCOUNTER — Inpatient Hospital Stay (HOSPITAL_BASED_OUTPATIENT_CLINIC_OR_DEPARTMENT_OTHER): Payer: PPO | Admitting: Internal Medicine

## 2019-06-01 ENCOUNTER — Encounter: Payer: Self-pay | Admitting: Internal Medicine

## 2019-06-01 VITALS — BP 128/93 | HR 75 | Temp 98.2°F | Resp 18 | Ht 71.0 in | Wt 277.1 lb

## 2019-06-01 DIAGNOSIS — C7970 Secondary malignant neoplasm of unspecified adrenal gland: Secondary | ICD-10-CM | POA: Insufficient documentation

## 2019-06-01 DIAGNOSIS — C7972 Secondary malignant neoplasm of left adrenal gland: Secondary | ICD-10-CM | POA: Diagnosis not present

## 2019-06-01 DIAGNOSIS — R0609 Other forms of dyspnea: Secondary | ICD-10-CM

## 2019-06-01 DIAGNOSIS — C3431 Malignant neoplasm of lower lobe, right bronchus or lung: Secondary | ICD-10-CM | POA: Diagnosis not present

## 2019-06-01 DIAGNOSIS — R5383 Other fatigue: Secondary | ICD-10-CM

## 2019-06-01 DIAGNOSIS — I10 Essential (primary) hypertension: Secondary | ICD-10-CM

## 2019-06-01 DIAGNOSIS — C3491 Malignant neoplasm of unspecified part of right bronchus or lung: Secondary | ICD-10-CM

## 2019-06-01 DIAGNOSIS — C342 Malignant neoplasm of middle lobe, bronchus or lung: Secondary | ICD-10-CM | POA: Diagnosis not present

## 2019-06-01 NOTE — Progress Notes (Signed)
Oncology Nurse Navigator Documentation  Oncology Nurse Navigator Flowsheets 06/01/2019  Navigator Location [facility]-  Referral Date to RadOnc/MedOnc -  Navigator Encounter Type Clinic/MDC/I spoke with patient today at cancer center.  He has abnormal PET scan is scheduled for CT BX.  I help to explain next step and he will get a phone call with appt.  He verbalized understanding.   Telephone -  Abnormal Finding Date 02/12/2019  Patient Visit Type MedOnc  Treatment Phase Abnormal Scans  Barriers/Navigation Needs Education  Education Other  Interventions Education  Coordination of Care -  Education Method Verbal  Acuity Level 1  Acuity Level 2 -  Time Spent with Patient 15

## 2019-06-01 NOTE — Progress Notes (Signed)
[facility] [phone]   [phone]  OFFICE PROGRESS NOTE  [name], MD [place] [address]  DIAGNOSIS: Stage IB (T2a, N0, M0) non-small cell lung cancer, adenocarcinoma presented with right lower lobe lung mass  PRIOR THERAPY: status post right middle and lower bilobectomies with lymph node dissection under the care of Dr. Roxan Hockey on January 16, 2018.  CURRENT THERAPY: Observation.  INTERVAL HISTORY: Christian [name] 73 y.o. male returns to the clinic today for follow-up visit.  The patient is feeling fine today with no concerning complaints except for mild fatigue.  He also has shortness of breath with exertion but denied having any chest pain, cough or hemoptysis.  He has no fever or chills.  He has no nausea, vomiting, diarrhea or constipation.  He denied having any recent weight loss or night sweats.  He was found on previous CT scan of the chest to have suspicious lesion in the left adrenal gland.  The patient had a PET scan performed recently and is here for evaluation and discussion of the PET scan results and recommendation regarding the left adrenal gland suspicious metastasis.  MEDICAL HISTORY: Past Medical History:  Diagnosis Date  . Anxiety   . Arthritis   . Cancer ([place])    skin  . Cancer ([place])    right lung  . Coronary artery disease   . Diabetes mellitus type 2 with complications ([place])    not on medication was "taken off of list"; pt states that he was pre diabetic  . Dyspnea    walks 100 yards then rest  . GERD (gastroesophageal reflux disease)    ocassional no meds; pt states that its no longer an issue (01/14/18)  . History of shingles    2004  . Hyperlipidemia   . Hypertension   . OSA on CPAP    pt states that he stopped wearing his CPAP  . Pneumonia 10/2017    ALLERGIES:  is allergic to celebrex [celecoxib].  MEDICATIONS:  Current Outpatient Medications  Medication Sig  Dispense Refill  . albuterol (PROVENTIL HFA;VENTOLIN HFA) 108 (90 BASE) MCG/ACT inhaler Inhale 2 puffs into the lungs every 6 (six) hours as needed for wheezing or shortness of breath. (Patient taking differently: Inhale 1 puff into the lungs every 6 (six) hours as needed for wheezing or shortness of breath. ) 1 Inhaler 3  . aspirin 325 MG tablet Take 325 mg by mouth daily.    . baclofen (LIORESAL) 20 MG tablet TAKE 1 TABLET (20 MG TOTAL) BY MOUTH 2 (TWO) TIMES DAILY AS NEEDED FOR MUSCLE SPASMS. 180 tablet 0  . clonazePAM (KLONOPIN) 0.5 MG tablet Take 1 tablet (0.5 mg total) by mouth at bedtime as needed. 30 tablet 2  . diphenhydrAMINE (BENADRYL) 25 mg capsule Take by mouth.    Marland Kitchen guaiFENesin (MUCINEX) 600 MG 12 hr tablet Take 2 tablets (1,200 mg total) by mouth 2 (two) times daily as needed for cough or to loosen phlegm.    Marland Kitchen ketoconazole (NIZORAL) 2 % cream Apply 1 application topically 2 (two) times daily as needed for irritation.    Marland Kitchen lisinopril (PRINIVIL,ZESTRIL) 10 MG tablet Take 1 tablet (10 mg total) by mouth daily. 90 tablet 3  . Melatonin 1 MG TABS Take by mouth.    . metoprolol succinate (TOPROL-XL) 50 MG 24 hr tablet TAKE 1 TABLET (50 MG TOTAL) BY MOUTH DAILY. TAKE WITH OR IMMEDIATELY FOLLOWING A MEAL.  90 tablet 3  . nitroGLYCERIN (NITROSTAT) 0.4 MG SL tablet Place 1 tablet (0.4 mg total) under the tongue every 5 (five) minutes as needed for chest pain. 25 tablet 1  . simvastatin (ZOCOR) 40 MG tablet Take 1 tablet (40 mg total) by mouth at bedtime. 90 tablet 3  . vitamin E 100 UNIT capsule Take 100 Units by mouth daily.     No current facility-administered medications for this visit.     SURGICAL HISTORY:  Past Surgical History:  Procedure Laterality Date  . CARDIAC CATHETERIZATION     2010 @ [facility] (per pt)  . COLONOSCOPY W/ POLYPECTOMY    . EYE SURGERY Bilateral    cataracts  . OPEN REDUCTION INTERNAL FIXATION (ORIF) DISTAL RADIAL FRACTURE Right 11/26/2013    Procedure: OPEN REDUCTION INTERNAL FIXATION (ORIF) DISTAL RADIAL FRACTURE;  Surgeon: Newt Minion, MD;  Location: [facility];  Service: Orthopedics;  Laterality: Right;  Open Reduction Internal Fixation Right Distal Radius   . VIDEO ASSISTED THORACOSCOPY (VATS)/ LOBECTOMY Right 01/16/2018   Procedure: VIDEO ASSISTED THORACOSCOPY (VATS)/RIGHT MIDDLE AND LOWER LOBE LUNG LOBECTOMY WITH NODE BIOPSIES x6;  Surgeon: Melrose Nakayama, MD;  Location: [facility];  Service: Thoracic;  Laterality: Right;  Marland Kitchen VIDEO BRONCHOSCOPY WITH ENDOBRONCHIAL NAVIGATION Right 12/15/2017   Procedure: VIDEO BRONCHOSCOPY WITH ENDOBRONCHIAL NAVIGATION;  Surgeon: Melrose Nakayama, MD;  Location: [facility];  Service: Thoracic;  Laterality: Right;  Marland Kitchen VIDEO BRONCHOSCOPY WITH ENDOBRONCHIAL ULTRASOUND N/A 12/15/2017   Procedure: VIDEO BRONCHOSCOPY WITH ENDOBRONCHIAL ULTRASOUND;  Surgeon: Melrose Nakayama, MD;  Location: [facility];  Service: Thoracic;  Laterality: N/A;    REVIEW OF SYSTEMS:  Constitutional: positive for fatigue Eyes: negative Ears, nose, mouth, throat, and face: negative Respiratory: positive for dyspnea on exertion Cardiovascular: negative Gastrointestinal: negative Genitourinary:negative Integument/breast: negative Hematologic/lymphatic: negative Musculoskeletal:negative Neurological: negative Behavioral/Psych: negative Endocrine: negative Allergic/Immunologic: negative   PHYSICAL EXAMINATION: General appearance: alert, cooperative, fatigued and no distress Head: Normocephalic, without obvious abnormality, atraumatic Neck: no adenopathy, no JVD, supple, symmetrical, trachea midline and thyroid not enlarged, symmetric, no tenderness/mass/nodules Lymph nodes: Cervical, supraclavicular, and axillary nodes normal. Resp: clear to auscultation bilaterally Back: symmetric, no curvature. ROM normal. No CVA tenderness. Cardio: regular rate and rhythm, S1, S2 normal, no murmur, click, rub or gallop GI: soft, non-tender;  bowel sounds normal; no masses,  no organomegaly Extremities: extremities normal, atraumatic, no cyanosis or edema Neurologic: Alert and oriented X 3, normal strength and tone. Normal symmetric reflexes. Normal coordination and gait  ECOG PERFORMANCE STATUS: 1 - Symptomatic but completely ambulatory  Blood pressure (!) 128/93, pulse 75, temperature 98.2 F (36.8 C), temperature source Oral, resp. rate 18, height 5\' 11"  (1.803 m), weight 277 lb 1.6 oz (125.7 kg), SpO2 94 %.  LABORATORY DATA: Lab Results  Component Value Date   WBC 7.1 05/14/2019   HGB 12.1 (L) 05/14/2019   HCT 36.8 (L) 05/14/2019   MCV 105.1 (H) 05/14/2019   PLT 168 05/14/2019      Chemistry      Component Value Date/Time   NA 140 05/14/2019 1026   K 4.4 05/14/2019 1026   CL 110 05/14/2019 1026   CO2 21 (L) 05/14/2019 1026   BUN 23 05/14/2019 1026   CREATININE 1.24 05/14/2019 1026   CREATININE 0.99 07/11/2016 1448      Component Value Date/Time   CALCIUM 9.3 05/14/2019 1026   ALKPHOS 64 05/14/2019 1026   AST 21 05/14/2019 1026   ALT 26 05/14/2019 1026  BILITOT 0.3 05/14/2019 1026       RADIOGRAPHIC STUDIES: Ct Abdomen Pelvis W Contrast  Result Date: 05/14/2019 CLINICAL DATA:  Left adrenal mass. Right lung carcinoma. EXAM: CT ABDOMEN AND PELVIS WITH CONTRAST TECHNIQUE: Multidetector CT imaging of the abdomen and pelvis was performed using the standard protocol following bolus administration of intravenous contrast. CONTRAST:  189mL OMNIPAQUE IOHEXOL 300 MG/ML  SOLN COMPARISON:  Chest CT on 02/12/2019, and PET-CT on 11/18/2017 FINDINGS: Lower Chest: No acute findings. Hepatobiliary: Mild hepatic steatosis. Stable sub-cm cyst in anterior liver dome. No liver masses identified. Gallbladder is unremarkable. Pancreas:  No mass or inflammatory changes. Spleen: Within normal limits in size and appearance. Adrenals/Urinary Tract: Left adrenal mass is seen measuring 4.8 x 2.9 cm. This shows mild increase in size from  4.7 x 2.3 cm on most recent CT, and is new since 2018 PET-CT. The right adrenal gland and kidney are normal in appearance. Benign-appearing left renal cyst again noted. No evidence ureteral calculi or hydronephrosis. Unremarkable unopacified urinary bladder. Stomach/Bowel: No evidence of obstruction, inflammatory process or abnormal fluid collections. Normal appendix visualized. Diverticulosis is seen mainly involving the descending and sigmoid colon, however there is no evidence of diverticulitis. Vascular/Lymphatic: No pathologically enlarged lymph nodes. No abdominal aortic aneurysm. Aortic atherosclerosis. Reproductive:  Mildly enlarged prostate. Other:  None. Musculoskeletal: No suspicious bone lesions identified. Old L1 and L4 vertebral body compression fractures noted IMPRESSION: 1. Mild increase in size of 4.8 cm left adrenal mass, highly suspicious for adrenal metastasis. 2. No other sites of metastatic disease identified within the abdomen or pelvis. 3. Colonic diverticulosis. No radiographic evidence of diverticulitis. 4. Mild hepatic steatosis. 5. Mildly enlarged prostate. Aortic Atherosclerosis (ICD10-I70.0). Electronically Signed   By: Earle Gell M.D.   On: 05/14/2019 13:38   Nm Pet Image Restag (ps) Skull Base To Thigh  Result Date: 05/25/2019 CLINICAL DATA:  Subsequent treatment strategy for non-small cell lung cancer. Enlarging left adrenal gland nodule. EXAM: NUCLEAR MEDICINE PET SKULL BASE TO THIGH TECHNIQUE: 13.89 mCi F-18 FDG was injected intravenously. Full-ring PET imaging was performed from the skull base to thigh after the radiotracer. CT data was obtained and used for attenuation correction and anatomic localization. Fasting blood glucose: 119 mg/dl COMPARISON:  02/12/2019 and 05/14/2019 CT scans FINDINGS: Mediastinal blood pool activity: SUV max 2.66 Liver activity: SUV max NA NECK: No hypermetabolic lymph nodes in the neck. Incidental CT findings: none CHEST: Status post right upper  lobe and right middle lobe lobectomies. No findings suspicious for recurrent tumor. No enlarged or hypermetabolic mediastinal or hilar lymph nodes to suggest metastatic adenopathy. No worrisome pulmonary nodules. Incidental CT findings: Very small right pleural effusion. Stable aortic and three-vessel coronary artery calcifications. ABDOMEN/PELVIS: 4.7 cm left adrenal gland mass is hypermetabolic with SUV max of 7.78 consistent with metastatic disease. The right adrenal gland is normal. No worrisome hepatic lesions to suggest metastatic disease. No abdominal/pelvic lymphadenopathy. Incidental CT findings: Stable left renal cyst and extensive vascular disease. SKELETON: No focal hypermetabolic activity to suggest skeletal metastasis. Incidental CT findings: none IMPRESSION: 1. 4.7 cm left adrenal gland mass is hypermetabolic and most consistent with a metastatic lesion. 2. No other findings for metastatic disease involving the neck, chest, abdomen or pelvis. 3. Stable extensive vascular disease. Electronically Signed   By: Marijo Sanes M.D.   On: 05/25/2019 09:58    ASSESSMENT AND PLAN: This is a very pleasant 72 years old white male with a stage IB non-small cell lung cancer, adenocarcinoma status  post right middle and lower bilobectomies with lymph node dissection under the care of Dr. Roxan Hockey on January 16, 2018.   The patient is currently on observation and he is feeling fine with no concerning complaints. Recent CT scan of the chest, abdomen and pelvis showed increase in the size of left adrenal mass suspicious for adrenal metastasis. I ordered a PET scan and the patient is here today for discussion of his scan results.  The PET scan showed 4.7 cm left adrenal mass that is hypermetabolic and consistent with metastatic lesion.  There was no other evidence of metastatic disease in the neck, chest, abdomen or pelvis. I recommended for the patient to have CT-guided core biopsy of the left adrenal gland  mass for confirmation of the tissue diagnosis before consideration of treatment. I will see the patient back for follow-up visit in 2 weeks for evaluation and discussion of his treatment plan based on the final pathology. He was advised to call immediately if he has any concerning symptoms in the interval. The patient voices understanding of current disease status and treatment options and is in agreement with the current care plan. All questions were answered. The patient knows to call the clinic with any problems, questions or concerns. We can certainly see the patient much sooner if necessary.  Disclaimer: This note was dictated with voice recognition software. Similar sounding words can inadvertently be transcribed and may not be corrected upon review.

## 2019-06-02 ENCOUNTER — Telehealth: Payer: Self-pay | Admitting: Internal Medicine

## 2019-06-02 NOTE — Telephone Encounter (Signed)
Scheduled appt per 6/23 los - unable to reach pt . Left message with appt date and time

## 2019-06-04 ENCOUNTER — Ambulatory Visit (HOSPITAL_COMMUNITY): Payer: PPO

## 2019-06-07 ENCOUNTER — Ambulatory Visit (HOSPITAL_COMMUNITY)
Admission: RE | Admit: 2019-06-07 | Discharge: 2019-06-07 | Disposition: A | Discharge status: Home / Self Care | Payer: PPO | Source: Ambulatory Visit | Attending: Internal Medicine | Admitting: Internal Medicine

## 2019-06-07 DIAGNOSIS — Z01812 Encounter for preprocedural laboratory examination: Secondary | ICD-10-CM | POA: Insufficient documentation

## 2019-06-07 DIAGNOSIS — Z1159 Encounter for screening for other viral diseases: Secondary | ICD-10-CM | POA: Diagnosis not present

## 2019-06-07 LAB — SARS CORONAVIRUS 2 (TAT 6-24 HRS): SARS Coronavirus 2: NEGATIVE

## 2019-06-08 ENCOUNTER — Other Ambulatory Visit: Payer: Self-pay | Admitting: Student

## 2019-06-09 ENCOUNTER — Other Ambulatory Visit: Payer: Self-pay

## 2019-06-09 ENCOUNTER — Ambulatory Visit (HOSPITAL_COMMUNITY)
Admission: RE | Admit: 2019-06-09 | Discharge: 2019-06-09 | Disposition: A | Discharge status: Home / Self Care | Payer: PPO | Source: Ambulatory Visit | Attending: Internal Medicine | Admitting: Internal Medicine

## 2019-06-09 DIAGNOSIS — E119 Type 2 diabetes mellitus without complications: Secondary | ICD-10-CM | POA: Diagnosis not present

## 2019-06-09 DIAGNOSIS — Z902 Acquired absence of lung [part of]: Secondary | ICD-10-CM | POA: Diagnosis not present

## 2019-06-09 DIAGNOSIS — Z7982 Long term (current) use of aspirin: Secondary | ICD-10-CM | POA: Diagnosis not present

## 2019-06-09 DIAGNOSIS — C3491 Malignant neoplasm of unspecified part of right bronchus or lung: Secondary | ICD-10-CM | POA: Diagnosis not present

## 2019-06-09 DIAGNOSIS — G4733 Obstructive sleep apnea (adult) (pediatric): Secondary | ICD-10-CM | POA: Diagnosis not present

## 2019-06-09 DIAGNOSIS — Z8042 Family history of malignant neoplasm of prostate: Secondary | ICD-10-CM | POA: Insufficient documentation

## 2019-06-09 DIAGNOSIS — I2510 Atherosclerotic heart disease of native coronary artery without angina pectoris: Secondary | ICD-10-CM | POA: Insufficient documentation

## 2019-06-09 DIAGNOSIS — C7972 Secondary malignant neoplasm of left adrenal gland: Secondary | ICD-10-CM | POA: Diagnosis not present

## 2019-06-09 DIAGNOSIS — E785 Hyperlipidemia, unspecified: Secondary | ICD-10-CM | POA: Insufficient documentation

## 2019-06-09 DIAGNOSIS — Z87891 Personal history of nicotine dependence: Secondary | ICD-10-CM | POA: Diagnosis not present

## 2019-06-09 DIAGNOSIS — Z8249 Family history of ischemic heart disease and other diseases of the circulatory system: Secondary | ICD-10-CM | POA: Insufficient documentation

## 2019-06-09 DIAGNOSIS — Z800 Family history of malignant neoplasm of digestive organs: Secondary | ICD-10-CM | POA: Insufficient documentation

## 2019-06-09 DIAGNOSIS — Z79899 Other long term (current) drug therapy: Secondary | ICD-10-CM | POA: Insufficient documentation

## 2019-06-09 DIAGNOSIS — E279 Disorder of adrenal gland, unspecified: Secondary | ICD-10-CM | POA: Diagnosis not present

## 2019-06-09 DIAGNOSIS — I10 Essential (primary) hypertension: Secondary | ICD-10-CM | POA: Insufficient documentation

## 2019-06-09 DIAGNOSIS — C3490 Malignant neoplasm of unspecified part of unspecified bronchus or lung: Secondary | ICD-10-CM | POA: Diagnosis not present

## 2019-06-09 LAB — PROTIME-INR
INR: 1 (ref 0.8–1.2)
Prothrombin Time: 13.3 seconds (ref 11.4–15.2)

## 2019-06-09 LAB — CBC
HCT: 36.6 % — ABNORMAL LOW (ref 39.0–52.0)
Hemoglobin: 12.2 g/dL — ABNORMAL LOW (ref 13.0–17.0)
MCH: 34.9 pg — ABNORMAL HIGH (ref 26.0–34.0)
MCHC: 33.3 g/dL (ref 30.0–36.0)
MCV: 104.6 fL — ABNORMAL HIGH (ref 80.0–100.0)
Platelets: 172 10*3/uL (ref 150–400)
RBC: 3.5 MIL/uL — ABNORMAL LOW (ref 4.22–5.81)
RDW: 13.9 % (ref 11.5–15.5)
WBC: 6.7 10*3/uL (ref 4.0–10.5)
nRBC: 0.4 % — ABNORMAL HIGH (ref 0.0–0.2)

## 2019-06-09 MED ORDER — HYDROCODONE-ACETAMINOPHEN 5-325 MG PO TABS: 1.0000 | ORAL_TABLET | ORAL | Status: DC | PRN

## 2019-06-09 MED ORDER — MIDAZOLAM HCL 2 MG/2ML IJ SOLN
INTRAMUSCULAR | Status: AC | PRN
  Administered 2019-06-09: 0.5 mg via INTRAVENOUS
  Administered 2019-06-09: 1 mg via INTRAVENOUS
  Administered 2019-06-09: 0.5 mg via INTRAVENOUS

## 2019-06-09 MED ORDER — FENTANYL CITRATE (PF) 100 MCG/2ML IJ SOLN
INTRAMUSCULAR | Status: AC | PRN
  Administered 2019-06-09 (×2): 50 ug via INTRAVENOUS

## 2019-06-09 MED ORDER — SODIUM CHLORIDE 0.9 % IV SOLN
INTRAVENOUS | Status: AC | PRN
  Administered 2019-06-09: 10 mL/h via INTRAVENOUS

## 2019-06-09 MED ORDER — LIDOCAINE HCL 1 % IJ SOLN
INTRAMUSCULAR | Status: AC
  Filled 2019-06-09: qty 20

## 2019-06-09 MED ORDER — MIDAZOLAM HCL 2 MG/2ML IJ SOLN
INTRAMUSCULAR | Status: AC
  Filled 2019-06-09: qty 2

## 2019-06-09 MED ORDER — SODIUM CHLORIDE 0.9 % IV SOLN: INTRAVENOUS | Status: DC

## 2019-06-09 MED ORDER — FENTANYL CITRATE (PF) 100 MCG/2ML IJ SOLN
INTRAMUSCULAR | Status: AC
  Filled 2019-06-09: qty 2

## 2019-06-09 NOTE — H&P (Signed)
Chief Complaint: Patient was seen in consultation today for left adrenal mass biopsy.  Referring Physician(s): Mohamed,Mohamed  Supervising Physician: Arne Cleveland  Patient Status: [facility] - Out-pt  History of Present Illness: Christian [name] is a 73 y.o. male with a past medical history anxiety, GERD, OSA, HLD, HTN, CAD, DM II and right lung cancer s/p VATS and lobectomy 2019 followed by Dr. Julien Nordmann who presents today for left adrenal mass biopsy. He was last seen by Dr. Julien Nordmann on 06/01/19 to discuss the results of his PET scan from 05/25/19 which showed a 4.7 cm left adrenal gland mass that is hypermetabolic and most consistent with a metastatic lesion. Request has been made to IR for biopsy of this adrenal mass for which patient presents today.  Christian [name] denies any complaints today, he is looking forward to the procedure being over so that he can eat. He states he had a cup of black coffee between 7 and 8 am today because he was under the impression that black coffee did not count as eating or drinking. He states understanding of procedure and wishes to proceed.   Past Medical History:  Diagnosis Date   Anxiety    Arthritis    Cancer ([place])    skin   Cancer ([place])    right lung   Coronary artery disease    Diabetes mellitus type 2 with complications ([place])    not on medication was "taken off of list"; pt states that he was pre diabetic   Dyspnea    walks 100 yards then rest   GERD (gastroesophageal reflux disease)    ocassional no meds; pt states that its no longer an issue (01/14/18)   History of shingles    2004   Hyperlipidemia    Hypertension    OSA on CPAP    pt states that he stopped wearing his CPAP   Pneumonia 10/2017    Past Surgical History:  Procedure Laterality Date   CARDIAC CATHETERIZATION     2010 @ [facility] (per pt)   COLONOSCOPY W/ POLYPECTOMY     EYE SURGERY Bilateral    cataracts   OPEN REDUCTION INTERNAL FIXATION (ORIF)  DISTAL RADIAL FRACTURE Right 11/26/2013   Procedure: OPEN REDUCTION INTERNAL FIXATION (ORIF) DISTAL RADIAL FRACTURE;  Surgeon: Newt Minion, MD;  Location: [place];  Service: Orthopedics;  Laterality: Right;  Open Reduction Internal Fixation Right Distal Radius    VIDEO ASSISTED THORACOSCOPY (VATS)/ LOBECTOMY Right 01/16/2018   Procedure: VIDEO ASSISTED THORACOSCOPY (VATS)/RIGHT MIDDLE AND LOWER LOBE LUNG LOBECTOMY WITH NODE BIOPSIES x6;  Surgeon: Melrose Nakayama, MD;  Location: [place];  Service: Thoracic;  Laterality: Right;   VIDEO BRONCHOSCOPY WITH ENDOBRONCHIAL NAVIGATION Right 12/15/2017   Procedure: VIDEO BRONCHOSCOPY WITH ENDOBRONCHIAL NAVIGATION;  Surgeon: Melrose Nakayama, MD;  Location: [place];  Service: Thoracic;  Laterality: Right;   VIDEO BRONCHOSCOPY WITH ENDOBRONCHIAL ULTRASOUND N/A 12/15/2017   Procedure: VIDEO BRONCHOSCOPY WITH ENDOBRONCHIAL ULTRASOUND;  Surgeon: Melrose Nakayama, MD;  Location: [facility];  Service: Thoracic;  Laterality: N/A;    Allergies: Celebrex [celecoxib]  Medications: Prior to Admission medications   Medication Sig Start Date End Date Taking? Authorizing Provider  aspirin 81 MG EC tablet Take 162 mg by mouth daily.    Yes [provider]  baclofen (LIORESAL) 20 MG tablet TAKE 1 TABLET (20 MG TOTAL) BY MOUTH 2 (TWO) TIMES DAILY AS NEEDED FOR MUSCLE SPASMS. 04/14/19  Yes [name], MD  clonazePAM Bobbye [place])  0.5 MG tablet Take 1 tablet (0.5 mg total) by mouth at bedtime as needed. 01/17/19  Yes [name], MD  diphenhydrAMINE (BENADRYL) 25 mg capsule Take 25 mg by mouth at bedtime.    Yes [provider]  lisinopril (PRINIVIL,ZESTRIL) 10 MG tablet Take 1 tablet (10 mg total) by mouth daily. 08/28/18  Yes [name], MD  Melatonin 10 MG TABS Take 10 mg by mouth at bedtime.    Yes [provider]  metoprolol succinate (TOPROL-XL) 50 MG 24 hr tablet TAKE 1 TABLET (50 MG TOTAL) BY MOUTH DAILY. TAKE WITH OR  IMMEDIATELY FOLLOWING A MEAL. 07/31/18  Yes [name], MD  nitroGLYCERIN (NITROSTAT) 0.4 MG SL tablet Place 1 tablet (0.4 mg total) under the tongue every 5 (five) minutes as needed for chest pain. 05/07/16  Yes Arbutus Leas, NP  simvastatin (ZOCOR) 40 MG tablet Take 1 tablet (40 mg total) by mouth at bedtime. 08/28/18  Yes [name], MD  vitamin E 100 UNIT capsule Take 100 Units by mouth daily.   Yes [provider]  albuterol (PROVENTIL HFA;VENTOLIN HFA) 108 (90 BASE) MCG/ACT inhaler Inhale 2 puffs into the lungs every 6 (six) hours as needed for wheezing or shortness of breath. Patient taking differently: Inhale 1 puff into the lungs every 6 (six) hours as needed for wheezing or shortness of breath.  02/27/15   Robyn Haber, MD  ketoconazole (NIZORAL) 2 % cream Apply 1 application topically 2 (two) times daily as needed for irritation. 01/21/18   Nani Skillern, PA-C     Family History  Problem Relation Age of Onset   Heart disease Mother    Heart disease Father    Colon cancer Father        possible colon or prostate cancer, dx'd in his 32s, patient wasn't sure of source.    Prostate cancer Father        possible colon or prostate cancer, dx'd in his 29s, patient wasn't sure of source.    Heart disease Brother    Diabetes Brother    Kidney disease Brother    Heart disease Sister    Cancer Sister     Social History   Socioeconomic History   Marital status: Widowed    Spouse name: Not on file   Number of children: Not on file   Years of education: Not on file   Highest education level: Not on file  Occupational History   Not on file  Social Needs   Financial resource strain: Not on file   Food insecurity    Worry: Not on file    Inability: Not on file   Transportation needs    Medical: Not on file    Non-medical: Not on file  Tobacco Use   Smoking status: Former Smoker    Packs/day: 1.00    Years: 51.00    Pack years:  51.00    Types: Cigarettes, E-cigarettes    Quit date: 01/15/2018    Years since quitting: 1.3   Smokeless tobacco: Never Used   Tobacco comment: pt "considering" stopping  Substance and Sexual Activity   Alcohol use: Yes    Alcohol/week: 7.0 standard drinks    Types: 7 Glasses of wine per week    Comment: 1 glass of wine per night-    Drug use: No   Sexual activity: Not on file  Lifestyle   Physical activity    Days per week: Not on file  Minutes per session: Not on file   Stress: Not on file  Relationships   Social connections    Talks on phone: Not on file    Gets together: Not on file    Attends religious service: Not on file    Active member of club or organization: Not on file    Attends meetings of clubs or organizations: Not on file    Relationship status: Not on file  Other Topics Concern   Not on file  Social History Narrative   [employer] fan   Widowed 2017 (from second marriage)   Retired from [employer]   2 kids, 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  All other systems are negative.  Review of Systems  Constitutional: Negative for appetite change, chills and fever.  Respiratory: Negative for cough and shortness of breath.   Cardiovascular: Negative for chest pain.  Gastrointestinal: Negative for abdominal pain, blood in stool, diarrhea, nausea and vomiting.  Genitourinary: Negative for dysuria and hematuria.  Musculoskeletal: Negative for back pain.  Skin: Negative for rash.  Neurological: Negative for dizziness and syncope.    Vital Signs: BP 140/82    Pulse 61    Temp 98 F (36.7 C)    Ht 5\' 11"  (1.803 m)    Wt 275 lb (124.7 kg)    SpO2 99%    BMI 38.35 kg/m   Physical Exam Vitals signs reviewed.  Constitutional:      General: He is not in acute distress.    Appearance: He is obese.  HENT:     Head:  Normocephalic.  Cardiovascular:     Rate and Rhythm: Normal rate and regular rhythm.  Pulmonary:     Effort: Pulmonary effort is normal.     Breath sounds: Normal breath sounds.  Abdominal:     General: There is no distension.     Palpations: Abdomen is soft.     Tenderness: There is no abdominal tenderness.  Skin:    General: Skin is warm and dry.  Neurological:     Mental Status: He is alert and oriented to person, place, and time.  Psychiatric:        Mood and Affect: Mood normal.        Behavior: Behavior normal.        Thought Content: Thought content normal.        Judgment: Judgment normal.      MD Evaluation Airway: WNL((+) top dentures) Heart: WNL Abdomen: WNL Chest/ Lungs: WNL ASA  Classification: 3 Mallampati/Airway Score: Two   Imaging: Ct Abdomen Pelvis W Contrast  Result Date: 05/14/2019 CLINICAL DATA:  Left adrenal mass. Right lung carcinoma. EXAM: CT ABDOMEN AND PELVIS WITH CONTRAST TECHNIQUE: Multidetector CT imaging of the abdomen and pelvis was performed using the standard protocol following bolus administration of intravenous contrast. CONTRAST:  152mL OMNIPAQUE IOHEXOL 300 MG/ML  SOLN COMPARISON:  Chest CT on 02/12/2019, and PET-CT on 11/18/2017 FINDINGS: Lower Chest: No acute findings. Hepatobiliary: Mild hepatic steatosis. Stable sub-cm cyst in anterior liver dome. No liver masses identified. Gallbladder is unremarkable. Pancreas:  No mass or inflammatory changes. Spleen: Within normal limits in size and appearance. Adrenals/Urinary Tract: Left adrenal mass is seen measuring 4.8 x 2.9 cm. This shows mild increase in size from 4.7 x 2.3 cm on most recent CT,  and is new since 2018 PET-CT. The right adrenal gland and kidney are normal in appearance. Benign-appearing left renal cyst again noted. No evidence ureteral calculi or hydronephrosis. Unremarkable unopacified urinary bladder. Stomach/Bowel: No evidence of obstruction, inflammatory process or abnormal fluid  collections. Normal appendix visualized. Diverticulosis is seen mainly involving the descending and sigmoid colon, however there is no evidence of diverticulitis. Vascular/Lymphatic: No pathologically enlarged lymph nodes. No abdominal aortic aneurysm. Aortic atherosclerosis. Reproductive:  Mildly enlarged prostate. Other:  None. Musculoskeletal: No suspicious bone lesions identified. Old L1 and L4 vertebral body compression fractures noted IMPRESSION: 1. Mild increase in size of 4.8 cm left adrenal mass, highly suspicious for adrenal metastasis. 2. No other sites of metastatic disease identified within the abdomen or pelvis. 3. Colonic diverticulosis. No radiographic evidence of diverticulitis. 4. Mild hepatic steatosis. 5. Mildly enlarged prostate. Aortic Atherosclerosis (ICD10-I70.0). Electronically Signed   By: Earle Gell M.D.   On: 05/14/2019 13:38   Nm Pet Image Restag (ps) Skull Base To Thigh  Result Date: 05/25/2019 CLINICAL DATA:  Subsequent treatment strategy for non-small cell lung cancer. Enlarging left adrenal gland nodule. EXAM: NUCLEAR MEDICINE PET SKULL BASE TO THIGH TECHNIQUE: 13.89 mCi F-18 FDG was injected intravenously. Full-ring PET imaging was performed from the skull base to thigh after the radiotracer. CT data was obtained and used for attenuation correction and anatomic localization. Fasting blood glucose: 119 mg/dl COMPARISON:  02/12/2019 and 05/14/2019 CT scans FINDINGS: Mediastinal blood pool activity: SUV max 2.66 Liver activity: SUV max NA NECK: No hypermetabolic lymph nodes in the neck. Incidental CT findings: none CHEST: Status post right upper lobe and right middle lobe lobectomies. No findings suspicious for recurrent tumor. No enlarged or hypermetabolic mediastinal or hilar lymph nodes to suggest metastatic adenopathy. No worrisome pulmonary nodules. Incidental CT findings: Very small right pleural effusion. Stable aortic and three-vessel coronary artery calcifications.  ABDOMEN/PELVIS: 4.7 cm left adrenal gland mass is hypermetabolic with SUV max of 4.19 consistent with metastatic disease. The right adrenal gland is normal. No worrisome hepatic lesions to suggest metastatic disease. No abdominal/pelvic lymphadenopathy. Incidental CT findings: Stable left renal cyst and extensive vascular disease. SKELETON: No focal hypermetabolic activity to suggest skeletal metastasis. Incidental CT findings: none IMPRESSION: 1. 4.7 cm left adrenal gland mass is hypermetabolic and most consistent with a metastatic lesion. 2. No other findings for metastatic disease involving the neck, chest, abdomen or pelvis. 3. Stable extensive vascular disease. Electronically Signed   By: Marijo Sanes M.D.   On: 05/25/2019 09:58    Labs:  CBC: Recent Labs    08/14/18 1319 02/12/19 0912 05/14/19 1026 06/09/19 0903  WBC 8.7 7.5 7.1 6.7  HGB 14.1 12.7* 12.1* 12.2*  HCT 42.7 39.6 36.8* 36.6*  PLT 184 161 168 172    COAGS: Recent Labs    06/09/19 0903  INR 1.0    BMP: Recent Labs    08/14/18 1319 08/28/18 1010 02/12/19 0912 05/14/19 1026  NA 140 138 140 140  K 4.8 4.3 4.3 4.4  CL 107 104 110 110  CO2 25 28 25  21*  GLUCOSE 118* 121* 156* 151*  BUN 24* 16 17 23   CALCIUM 9.9 9.5 9.2 9.3  CREATININE 1.43* 1.26 1.17 1.24  GFRNONAA 48*  --  >60 59*  GFRAA 56*  --  >60 >60    LIVER FUNCTION TESTS: Recent Labs    08/14/18 1319 02/12/19 0912 05/14/19 1026  BILITOT 0.6 0.5 0.3  AST 13* 20 21  ALT 11 24  26  ALKPHOS 84 56 64  PROT 7.6 7.2 7.0  ALBUMIN 4.2 4.4 4.1    TUMOR MARKERS: No results for input(s): AFPTM, CEA, CA199, CHROMGRNA in the last 8760 hours.  Assessment and Plan:  73 y/o M with history of right lung cancer s/p VATS and lobectomy followed by Dr. Julien Nordmann who presents today for a left adrenal mass biopsy which was seen on patient's PET scan from 05/25/19 and is most compatible with metastatic disease.   Patient reports drinking 1 cup of black coffee  between 7 and 8 am this morning - discussed with Dr. Vernard Gambles who will proceed with sedation 4 hours after this, patient states last PO intake prior to that was last night around 9 pm. He does not take blood thinning medications. Afebrile, WBC 6.7, hgb 12.2, plt 172, INR 1.0.  Risks and benefits discussed with the patient including, but not limited to bleeding, infection, damage to adjacent structures or low yield requiring additional tests.  All of the patient's questions were answered, patient is agreeable to proceed.  Consent signed and in chart.   Thank you for this interesting consult.  I greatly enjoyed meeting Christian [name] and look forward to participating in their care.  A copy of this report was sent to the requesting provider on this date.  Electronically Signed: Joaquim Nam, PA-C 06/09/2019, 10:33 AM   I spent a total of  30 Minutes  in face to face in clinical consultation, greater than 50% of which was counseling/coordinating care for left adrenal mass biopsy.

## 2019-06-09 NOTE — Discharge Instructions (Addendum)
Needle Biopsy, Care After This sheet gives you information about how to care for yourself after your procedure. Your health care provider may also give you more specific instructions. If you have problems or questions, contact your health care provider. What can I expect after the procedure? After the procedure, it is common to have soreness, bruising, or mild pain at the puncture site. This should go away in a few days. Follow these instructions at home: Needle insertion site care   Wash your hands with soap and water before you change your bandage (dressing). If you cannot use soap and water, use hand sanitizer.  Follow instructions from your health care provider about how to take care of your puncture site. This includes: ? When and how to change your dressing. ? When to remove your dressing.  Check your puncture site every day for signs of infection. Check for: ? Redness, swelling, or pain. ? Fluid or blood. ? Pus or a bad smell. ? Warmth. General instructions  Return to your normal activities as told by your health care provider. Ask your health care provider what activities are safe for you.  Do not take baths, swim, or use a hot tub until your health care provider approves. Ask your health care provider if you may take showers. You may only be allowed to take sponge baths.  Take over-the-counter and prescription medicines only as told by your health care provider.  Keep all follow-up visits as told by your health care provider. This is important. Contact a health care provider if:  You have a fever.  You have redness, swelling, or pain at the puncture site that lasts longer than a few days.  You have fluid, blood, or pus coming from your puncture site.  Your puncture site feels warm to the touch. Get help right away if:  You have severe bleeding from the puncture site. Summary  After the procedure, it is common to have soreness, bruising, or mild pain at the puncture  site. This should go away in a few days.  Check your puncture site every day for signs of infection, such as redness, swelling, or pain.  Get help right away if you have severe bleeding from your puncture site. This information is not intended to replace advice given to you by your health care provider. Make sure you discuss any questions you have with your health care provider. Document Released: 04/11/2015 Document Revised: 02/06/2018 Document Reviewed: 12/08/2017 Elsevier Patient Education  [place]. Moderate Conscious Sedation, Adult, Care After These instructions provide you with information about caring for yourself after your procedure. Your health care provider may also give you more specific instructions. Your treatment has been planned according to current medical practices, but problems sometimes occur. Call your health care provider if you have any problems or questions after your procedure. What can I expect after the procedure? After your procedure, it is common:  To feel sleepy for several hours.  To feel clumsy and have poor balance for several hours.  To have poor judgment for several hours.  To vomit if you eat too soon. Follow these instructions at home: For at least 24 hours after the procedure:   Do not: ? Participate in activities where you could fall or become injured. ? Drive. ? Use heavy machinery. ? Drink alcohol. ? Take sleeping pills or medicines that cause drowsiness. ? Make important decisions or sign legal documents. ? Take care of children on your own.  Rest. Eating and  drinking  Follow the diet recommended by your health care provider.  If you vomit: ? Drink water, juice, or soup when you can drink without vomiting. ? Make sure you have little or no nausea before eating solid foods. General instructions  Have a responsible adult stay with you until you are awake and alert.  Take over-the-counter and prescription medicines only as  told by your health care provider.  If you smoke, do not smoke without supervision.  Keep all follow-up visits as told by your health care provider. This is important. Contact a health care provider if:  You keep feeling nauseous or you keep vomiting.  You feel light-headed.  You develop a rash.  You have a fever. Get help right away if:  You have trouble breathing. This information is not intended to replace advice given to you by your health care provider. Make sure you discuss any questions you have with your health care provider. Document Released: 09/15/2013 Document Revised: 11/07/2017 Document Reviewed: 03/16/2016 Elsevier Patient Education  2020 [employer].

## 2019-06-09 NOTE — Procedures (Signed)
  Procedure: CT core L adrenal mass 18g x4 EBL:   minimal Complications:  none immediate  See full dictation in [facility].  Dillard Cannon MD Main # [phone] Pager  [phone]

## 2019-06-16 ENCOUNTER — Telehealth: Payer: Self-pay | Admitting: Internal Medicine

## 2019-06-16 ENCOUNTER — Inpatient Hospital Stay: Payer: PPO | Attending: Internal Medicine | Admitting: Internal Medicine

## 2019-06-16 ENCOUNTER — Encounter: Payer: Self-pay | Admitting: Internal Medicine

## 2019-06-16 ENCOUNTER — Other Ambulatory Visit: Payer: Self-pay

## 2019-06-16 VITALS — BP 126/50 | HR 69 | Temp 99.1°F | Resp 18 | Ht 71.0 in | Wt 278.7 lb

## 2019-06-16 DIAGNOSIS — R5383 Other fatigue: Secondary | ICD-10-CM | POA: Diagnosis not present

## 2019-06-16 DIAGNOSIS — C3431 Malignant neoplasm of lower lobe, right bronchus or lung: Secondary | ICD-10-CM | POA: Insufficient documentation

## 2019-06-16 DIAGNOSIS — R0609 Other forms of dyspnea: Secondary | ICD-10-CM | POA: Insufficient documentation

## 2019-06-16 DIAGNOSIS — R531 Weakness: Secondary | ICD-10-CM | POA: Diagnosis not present

## 2019-06-16 DIAGNOSIS — C3432 Malignant neoplasm of lower lobe, left bronchus or lung: Secondary | ICD-10-CM | POA: Diagnosis not present

## 2019-06-16 DIAGNOSIS — C342 Malignant neoplasm of middle lobe, bronchus or lung: Secondary | ICD-10-CM | POA: Insufficient documentation

## 2019-06-16 DIAGNOSIS — C7972 Secondary malignant neoplasm of left adrenal gland: Secondary | ICD-10-CM | POA: Insufficient documentation

## 2019-06-16 DIAGNOSIS — C3490 Malignant neoplasm of unspecified part of unspecified bronchus or lung: Secondary | ICD-10-CM

## 2019-06-16 DIAGNOSIS — C801 Malignant (primary) neoplasm, unspecified: Secondary | ICD-10-CM

## 2019-06-16 NOTE — Progress Notes (Signed)
[place] [phone]   [phone]  OFFICE PROGRESS NOTE  [name], MD [place] [address]  DIAGNOSIS: Metastatic non-small cell lung cancer, adenocarcinoma initially diagnosed as stage IB (T2a, N0, M0) non-small cell lung cancer, adenocarcinoma presented with right lower lobe lung mass.  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  PRIOR THERAPY: status post right middle and lower bilobectomies with lymph node dissection under the care of Dr. Roxan Hockey on January 16, 2018.  CURRENT THERAPY: Observation.  INTERVAL HISTORY: Christian [name] 73 y.o. male returns to the clinic today for follow-up visit.  The patient is feeling fine today with no concerning complaints except for generalized fatigue and weakness as well as shortness of breath with exertion.  He denied having any current chest pain, cough or hemoptysis.  He denied having any abdominal pain.  He has no nausea, vomiting, diarrhea or constipation.  He denied having any headache or visual changes.  He was found on imaging studies including CT scan of the chest as well as PET scan to have metastatic lesion to the left adrenal gland.  The patient was sent to interventional radiology for CT-guided core biopsy and the final pathology was consistent with metastatic adenocarcinoma.  He is here today for evaluation and recommendation regarding treatment of his condition.   MEDICAL HISTORY: Past Medical History:  Diagnosis Date  . Anxiety   . Arthritis   . Cancer ([place])    skin  . Cancer ([place])    right lung  . Coronary artery disease   . Diabetes mellitus type 2 with complications ([place])    not on medication was "taken off of list"; pt states that he was pre diabetic  . Dyspnea    walks 100 yards then rest  . GERD (gastroesophageal reflux disease)    ocassional no meds; pt states that its no longer an issue (01/14/18)  . History of  shingles    2004  . Hyperlipidemia   . Hypertension   . OSA on CPAP    pt states that he stopped wearing his CPAP  . Pneumonia 10/2017    ALLERGIES:  is allergic to celebrex [celecoxib].  MEDICATIONS:  Current Outpatient Medications  Medication Sig Dispense Refill  . albuterol (PROVENTIL HFA;VENTOLIN HFA) 108 (90 BASE) MCG/ACT inhaler Inhale 2 puffs into the lungs every 6 (six) hours as needed for wheezing or shortness of breath. (Patient taking differently: Inhale 1 puff into the lungs every 6 (six) hours as needed for wheezing or shortness of breath. ) 1 Inhaler 3  . aspirin 81 MG EC tablet Take 162 mg by mouth daily.     . baclofen (LIORESAL) 20 MG tablet TAKE 1 TABLET (20 MG TOTAL) BY MOUTH 2 (TWO) TIMES DAILY AS NEEDED FOR MUSCLE SPASMS. 180 tablet 0  . clonazePAM (KLONOPIN) 0.5 MG tablet Take 1 tablet (0.5 mg total) by mouth at bedtime as needed. 30 tablet 2  . diphenhydrAMINE (BENADRYL) 25 mg capsule Take 25 mg by mouth at bedtime.     Marland Kitchen ketoconazole (NIZORAL) 2 % cream Apply 1 application topically 2 (two) times daily as needed for irritation.    Marland Kitchen lisinopril (PRINIVIL,ZESTRIL) 10 MG tablet Take 1 tablet (10 mg total) by mouth daily. 90 tablet 3  . Melatonin 10 MG TABS Take 10 mg by mouth at bedtime.     . metoprolol succinate (TOPROL-XL) 50 MG 24  hr tablet TAKE 1 TABLET (50 MG TOTAL) BY MOUTH DAILY. TAKE WITH OR IMMEDIATELY FOLLOWING A MEAL. 90 tablet 3  . nitroGLYCERIN (NITROSTAT) 0.4 MG SL tablet Place 1 tablet (0.4 mg total) under the tongue every 5 (five) minutes as needed for chest pain. 25 tablet 1  . simvastatin (ZOCOR) 40 MG tablet Take 1 tablet (40 mg total) by mouth at bedtime. 90 tablet 3  . vitamin E 100 UNIT capsule Take 100 Units by mouth daily.     No current facility-administered medications for this visit.     SURGICAL HISTORY:  Past Surgical History:  Procedure Laterality Date  . CARDIAC CATHETERIZATION     2010 @ [facility] (per pt)  . COLONOSCOPY  W/ POLYPECTOMY    . EYE SURGERY Bilateral    cataracts  . OPEN REDUCTION INTERNAL FIXATION (ORIF) DISTAL RADIAL FRACTURE Right 11/26/2013   Procedure: OPEN REDUCTION INTERNAL FIXATION (ORIF) DISTAL RADIAL FRACTURE;  Surgeon: Newt Minion, MD;  Location: [facility];  Service: Orthopedics;  Laterality: Right;  Open Reduction Internal Fixation Right Distal Radius   . VIDEO ASSISTED THORACOSCOPY (VATS)/ LOBECTOMY Right 01/16/2018   Procedure: VIDEO ASSISTED THORACOSCOPY (VATS)/RIGHT MIDDLE AND LOWER LOBE LUNG LOBECTOMY WITH NODE BIOPSIES x6;  Surgeon: Melrose Nakayama, MD;  Location: [facility];  Service: Thoracic;  Laterality: Right;  Marland Kitchen VIDEO BRONCHOSCOPY WITH ENDOBRONCHIAL NAVIGATION Right 12/15/2017   Procedure: VIDEO BRONCHOSCOPY WITH ENDOBRONCHIAL NAVIGATION;  Surgeon: Melrose Nakayama, MD;  Location: [facility];  Service: Thoracic;  Laterality: Right;  Marland Kitchen VIDEO BRONCHOSCOPY WITH ENDOBRONCHIAL ULTRASOUND N/A 12/15/2017   Procedure: VIDEO BRONCHOSCOPY WITH ENDOBRONCHIAL ULTRASOUND;  Surgeon: Melrose Nakayama, MD;  Location: [facility];  Service: Thoracic;  Laterality: N/A;    REVIEW OF SYSTEMS:  Constitutional: positive for fatigue Eyes: negative Ears, nose, mouth, throat, and face: negative Respiratory: positive for dyspnea on exertion Cardiovascular: negative Gastrointestinal: negative Genitourinary:negative Integument/breast: negative Hematologic/lymphatic: negative Musculoskeletal:negative Neurological: negative Behavioral/Psych: negative Endocrine: negative Allergic/Immunologic: negative   PHYSICAL EXAMINATION: General appearance: alert, cooperative, fatigued and no distress Head: Normocephalic, without obvious abnormality, atraumatic Neck: no adenopathy, no JVD, supple, symmetrical, trachea midline and thyroid not enlarged, symmetric, no tenderness/mass/nodules Lymph nodes: Cervical, supraclavicular, and axillary nodes normal. Resp: clear to auscultation bilaterally Back: symmetric, no  curvature. ROM normal. No CVA tenderness. Cardio: regular rate and rhythm, S1, S2 normal, no murmur, click, rub or gallop GI: soft, non-tender; bowel sounds normal; no masses,  no organomegaly Extremities: extremities normal, atraumatic, no cyanosis or edema Neurologic: Alert and oriented X 3, normal strength and tone. Normal symmetric reflexes. Normal coordination and gait  ECOG PERFORMANCE STATUS: 1 - Symptomatic but completely ambulatory  Blood pressure (!) 126/50, pulse 69, temperature 99.1 F (37.3 C), temperature source Oral, resp. rate 18, height 5\' 11"  (1.803 m), weight 278 lb 11.2 oz (126.4 kg), SpO2 95 %.  LABORATORY DATA: Lab Results  Component Value Date   WBC 6.7 06/09/2019   HGB 12.2 (L) 06/09/2019   HCT 36.6 (L) 06/09/2019   MCV 104.6 (H) 06/09/2019   PLT 172 06/09/2019      Chemistry      Component Value Date/Time   NA 140 05/14/2019 1026   K 4.4 05/14/2019 1026   CL 110 05/14/2019 1026   CO2 21 (L) 05/14/2019 1026   BUN 23 05/14/2019 1026   CREATININE 1.24 05/14/2019 1026   CREATININE 0.99 07/11/2016 1448      Component Value Date/Time   CALCIUM 9.3 05/14/2019 1026  ALKPHOS 64 05/14/2019 1026   AST 21 05/14/2019 1026   ALT 26 05/14/2019 1026   BILITOT 0.3 05/14/2019 1026       RADIOGRAPHIC STUDIES: Nm Pet Image Restag (ps) Skull Base To Thigh  Result Date: 05/25/2019 CLINICAL DATA:  Subsequent treatment strategy for non-small cell lung cancer. Enlarging left adrenal gland nodule. EXAM: NUCLEAR MEDICINE PET SKULL BASE TO THIGH TECHNIQUE: 13.89 mCi F-18 FDG was injected intravenously. Full-ring PET imaging was performed from the skull base to thigh after the radiotracer. CT data was obtained and used for attenuation correction and anatomic localization. Fasting blood glucose: 119 mg/dl COMPARISON:  02/12/2019 and 05/14/2019 CT scans FINDINGS: Mediastinal blood pool activity: SUV max 2.66 Liver activity: SUV max NA NECK: No hypermetabolic lymph nodes in the  neck. Incidental CT findings: none CHEST: Status post right upper lobe and right middle lobe lobectomies. No findings suspicious for recurrent tumor. No enlarged or hypermetabolic mediastinal or hilar lymph nodes to suggest metastatic adenopathy. No worrisome pulmonary nodules. Incidental CT findings: Very small right pleural effusion. Stable aortic and three-vessel coronary artery calcifications. ABDOMEN/PELVIS: 4.7 cm left adrenal gland mass is hypermetabolic with SUV max of 6.76 consistent with metastatic disease. The right adrenal gland is normal. No worrisome hepatic lesions to suggest metastatic disease. No abdominal/pelvic lymphadenopathy. Incidental CT findings: Stable left renal cyst and extensive vascular disease. SKELETON: No focal hypermetabolic activity to suggest skeletal metastasis. Incidental CT findings: none IMPRESSION: 1. 4.7 cm left adrenal gland mass is hypermetabolic and most consistent with a metastatic lesion. 2. No other findings for metastatic disease involving the neck, chest, abdomen or pelvis. 3. Stable extensive vascular disease. Electronically Signed   By: Marijo Sanes M.D.   On: 05/25/2019 09:58   Ct Biopsy  Result Date: 06/09/2019 CLINICAL DATA:  History of lung carcinoma. Hypermetabolic left adrenal mass on recent PET-CT. Biopsy requested EXAM: CT GUIDED CORE BIOPSY OF LEFT ADRENAL MASS ANESTHESIA/SEDATION: Intravenous Fentanyl 180mcg and Versed 2mg  were administered as conscious sedation during continuous monitoring of the patient?s level of consciousness and physiological / cardiorespiratory status by the radiology RN, with a total moderate sedation time of 30 minutes. PROCEDURE: The procedure risks, benefits, and alternatives were explained to the patient. Questions regarding the procedure were encouraged and answered. The patient understands and consents to the procedure. Patient placed in left lateral decubitus position. Limited CT through the abdomen was obtained. An  appropriate skin entry site was determined and marked. The operative field was prepped with Betadine/chlorhexidinein a sterile fashion, and a sterile drape was applied covering the operative field. A sterile gown and sterile gloves were used for the procedure. Local anesthesia was provided with 1% Lidocaine. Using angled CT fluoroscopic guidance, a 17 gauge trocar needle was advanced to the margin of the left adrenal mass. Representative core biopsy samples were obtained. The guide needle removed. Postprocedure scans show no hemorrhage, pneumothorax, or other apparent complication. COMPLICATIONS: None immediate FINDINGS: Left adrenal mass localized. Representative core biopsy samples obtained as above. No pneumothorax or regional hemorrhage. IMPRESSION: Technically successful CT-guided core biopsy, left adrenal mass. Electronically Signed   By: Lucrezia Europe M.D.   On: 06/09/2019 13:32    ASSESSMENT AND PLAN: This is a very pleasant 73 years old white male with a stage IB non-small cell lung cancer, adenocarcinoma status post right middle and lower bilobectomies with lymph node dissection under the care of Dr. Roxan Hockey on January 16, 2018.   The patient is currently on observation and he is feeling  fine with no concerning complaints. Recent CT scan of the chest, abdomen and pelvis showed increase in the size of left adrenal mass suspicious for adrenal metastasis.  These findings were confirmed with the PET scan. The patient underwent CT-guided core biopsy of the left adrenal gland lesion and it was consistent with metastatic adenocarcinoma from likely lung primary. I had a lengthy discussion with the patient today about his condition and treatment options. I referred the patient to radiation oncology for consideration of palliative radiotherapy to the solitary left adrenal gland metastasis. I also requested the tissue block to be sent to [facility] for molecular studies and PDL 1 expression. I will  continue the patient on observation after his palliative radiotherapy with repeat CT scan of the chest, abdomen and pelvis in 3 months for restaging of his disease. We will consider him for systemic therapy if he develop any other metastatic lesion but he indicated no interest in systemic chemotherapy. The patient was advised to call immediately if he has any other concerning symptoms in the interval. The patient voices understanding of current disease status and treatment options and is in agreement with the current care plan. All questions were answered. The patient knows to call the clinic with any problems, questions or concerns. We can certainly see the patient much sooner if necessary.  Disclaimer: This note was dictated with voice recognition software. Similar sounding words can inadvertently be transcribed and may not be corrected upon review.

## 2019-06-16 NOTE — Telephone Encounter (Signed)
Scheduled appt per 7/08 los - lab and f/u in 3 months. - [facility] to contact pt. With scan appt. Mailed letter with appt date and time

## 2019-06-17 NOTE — Progress Notes (Signed)
Histology and Location of Primary Cancer: DIAGNOSIS: Metastatic non-small cell lung cancer, adenocarcinoma initially diagnosed as stage IB (T2a, N0, M0) non-small cell lung cancer, adenocarcinoma presented with right lower lobe lung mass.  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  Location(s) of Symptomatic tumor(s): solitary left adrenal gland metastasis  Past/Anticipated chemotherapy by medical oncology, if any: Per Dr. Julien Nordmann 06/16/19:  ASSESSMENT AND PLAN: This is a very pleasant 73 years old white male with a stage IB non-small cell lung cancer, adenocarcinoma status post right middle and lower bilobectomies with lymph node dissection under the care of Dr. Roxan Hockey on January 16, 2018.   The patient is currently on observation and he is feeling fine with no concerning complaints. Recent CT scan of the chest, abdomen and pelvis showed increase in the size of left adrenal mass suspicious for adrenal metastasis.  These findings were confirmed with the PET scan. The patient underwent CT-guided core biopsy of the left adrenal gland lesion and it was consistent with metastatic adenocarcinoma from likely lung primary. I had a lengthy discussion with the patient today about his condition and treatment options. I referred the patient to radiation oncology for consideration of palliative radiotherapy to the solitary left adrenal gland metastasis. I also requested the tissue block to be sent to [facility] for molecular studies and PDL 1 expression. I will continue the patient on observation after his palliative radiotherapy with repeat CT scan of the chest, abdomen and pelvis in 3 months for restaging of his disease. We will consider him for systemic therapy if he develop any other metastatic lesion but he indicated no interest in systemic chemotherapy.   Pain on a scale of 0-10 is: Pt denies c/o pain.   Ambulatory status? Walker? Wheelchair?: Pt ambulates with  steady gait. No assistive device. Pt with occasional tic of rolling neck.  SAFETY ISSUES:  Prior radiation? No  Pacemaker/ICD? No  Possible current pregnancy? N/A  Is the patient on methotrexate? No  Additional Complaints / other details:  Pt presents today for initial consult with Dr. Sondra Come for Radiation Oncology.   BP (!) 101/49 (BP Location: Right Arm, Patient Position: Sitting)   Pulse 82   Temp 99.1 F (37.3 C) (Oral)   Resp (!) 24   Ht 5\' 11"  (1.803 m)   Wt 278 lb 3.2 oz (126.2 kg)   SpO2 98%   BMI 38.80 kg/m   Wt Readings from Last 3 Encounters:  06/21/19 278 lb 3.2 oz (126.2 kg)  06/16/19 278 lb 11.2 oz (126.4 kg)  06/09/19 275 lb (124.7 kg)   Loma Sousa, RN BSN

## 2019-06-21 ENCOUNTER — Other Ambulatory Visit: Payer: Self-pay

## 2019-06-21 ENCOUNTER — Ambulatory Visit
Admission: RE | Admit: 2019-06-21 | Discharge: 2019-06-21 | Disposition: A | Discharge status: Home / Self Care | Payer: PPO | Source: Ambulatory Visit | Attending: Radiation Oncology | Admitting: Radiation Oncology

## 2019-06-21 ENCOUNTER — Encounter: Payer: Self-pay | Admitting: Radiation Oncology

## 2019-06-21 VITALS — BP 101/49 | HR 82 | Temp 99.1°F | Resp 24 | Ht 71.0 in | Wt 278.2 lb

## 2019-06-21 DIAGNOSIS — Z79899 Other long term (current) drug therapy: Secondary | ICD-10-CM | POA: Diagnosis not present

## 2019-06-21 DIAGNOSIS — K219 Gastro-esophageal reflux disease without esophagitis: Secondary | ICD-10-CM | POA: Insufficient documentation

## 2019-06-21 DIAGNOSIS — G4700 Insomnia, unspecified: Secondary | ICD-10-CM | POA: Insufficient documentation

## 2019-06-21 DIAGNOSIS — C7972 Secondary malignant neoplasm of left adrenal gland: Secondary | ICD-10-CM | POA: Insufficient documentation

## 2019-06-21 DIAGNOSIS — I10 Essential (primary) hypertension: Secondary | ICD-10-CM | POA: Diagnosis not present

## 2019-06-21 DIAGNOSIS — Z87891 Personal history of nicotine dependence: Secondary | ICD-10-CM | POA: Diagnosis not present

## 2019-06-21 DIAGNOSIS — Z800 Family history of malignant neoplasm of digestive organs: Secondary | ICD-10-CM | POA: Insufficient documentation

## 2019-06-21 DIAGNOSIS — Z902 Acquired absence of lung [part of]: Secondary | ICD-10-CM | POA: Diagnosis not present

## 2019-06-21 DIAGNOSIS — Z809 Family history of malignant neoplasm, unspecified: Secondary | ICD-10-CM | POA: Insufficient documentation

## 2019-06-21 DIAGNOSIS — F419 Anxiety disorder, unspecified: Secondary | ICD-10-CM | POA: Diagnosis not present

## 2019-06-21 DIAGNOSIS — I2510 Atherosclerotic heart disease of native coronary artery without angina pectoris: Secondary | ICD-10-CM | POA: Insufficient documentation

## 2019-06-21 DIAGNOSIS — R0602 Shortness of breath: Secondary | ICD-10-CM | POA: Diagnosis not present

## 2019-06-21 DIAGNOSIS — C3432 Malignant neoplasm of lower lobe, left bronchus or lung: Secondary | ICD-10-CM | POA: Diagnosis not present

## 2019-06-21 DIAGNOSIS — E119 Type 2 diabetes mellitus without complications: Secondary | ICD-10-CM | POA: Insufficient documentation

## 2019-06-21 DIAGNOSIS — C801 Malignant (primary) neoplasm, unspecified: Secondary | ICD-10-CM

## 2019-06-21 DIAGNOSIS — Z8042 Family history of malignant neoplasm of prostate: Secondary | ICD-10-CM | POA: Diagnosis not present

## 2019-06-21 DIAGNOSIS — E785 Hyperlipidemia, unspecified: Secondary | ICD-10-CM | POA: Diagnosis not present

## 2019-06-21 DIAGNOSIS — C3431 Malignant neoplasm of lower lobe, right bronchus or lung: Secondary | ICD-10-CM | POA: Diagnosis not present

## 2019-06-21 NOTE — Progress Notes (Signed)
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   Metastatic non-small cell lung cancer, adenocarcinoma initially diagnosed as stage IB (T2a, N0, M0) non-small cell lung cancer, adenocarcinoma presented with right lower lobe lung mass. 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 male who is presenting to the office today for evaluation of lung cancer and recently diagnosed adrenal metastasis.  He is s/p right middle and lower bilobectomies with lymph node dissection under the care of Dr. Roxan Hockey on 01/16/18. Per Dr. Worthy Flank note on 06/01/19; "Recent CT scan of the chest, abdomen and pelvis showed increase in the size of left adrenal mass suspicious for adrenal metastasis ... The PET scan showed 4.7 cm left adrenal mass that is hypermetabolic and consistent with metastatic lesion.  There was no other evidence of metastatic disease in the neck, chest, abdomen or pelvis." He proceeded with biopsy on 06/09/19 of the left adrenal gland in question. 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 He last met with Dr. Julien Nordmann on 06/16/19 who mentioned consideration for systemic treatment will be evaluated if he develops any other metastatic lesions. The pt did not indicate at this time any interest in chemotherapy.   he denies pain and any other symptoms.    PREVIOUS RADIATION THERAPY: No  PAST MEDICAL HISTORY:  has a past medical history of Anxiety, Arthritis, Cancer ([place]), Cancer ([place]), Coronary artery disease, Diabetes mellitus type 2 with complications ([place]), Dyspnea, GERD  (gastroesophageal reflux disease), History of shingles, Hyperlipidemia, Hypertension, OSA on CPAP, and Pneumonia (10/2017).    PAST SURGICAL HISTORY: Past Surgical History:  Procedure Laterality Date  . CARDIAC CATHETERIZATION     2010 @ [facility] (per pt)  . COLONOSCOPY W/ POLYPECTOMY    . EYE SURGERY Bilateral    cataracts  . OPEN REDUCTION INTERNAL FIXATION (ORIF) DISTAL RADIAL FRACTURE Right 11/26/2013   Procedure: OPEN REDUCTION INTERNAL FIXATION (ORIF) DISTAL RADIAL FRACTURE;  Surgeon: Newt Minion, MD;  Location: [place];  Service: Orthopedics;  Laterality: Right;  Open Reduction Internal Fixation Right Distal Radius   . VIDEO ASSISTED THORACOSCOPY (VATS)/ LOBECTOMY Right 01/16/2018   Procedure: VIDEO ASSISTED THORACOSCOPY (VATS)/RIGHT MIDDLE AND LOWER LOBE LUNG LOBECTOMY WITH NODE BIOPSIES x6;  Surgeon: Melrose Nakayama, MD;  Location: [place];  Service: Thoracic;  Laterality: Right;  Marland Kitchen VIDEO BRONCHOSCOPY WITH ENDOBRONCHIAL NAVIGATION Right 12/15/2017   Procedure: VIDEO BRONCHOSCOPY WITH ENDOBRONCHIAL NAVIGATION;  Surgeon: Melrose Nakayama, MD;  Location: [place];  Service: Thoracic;  Laterality: Right;  Marland Kitchen VIDEO BRONCHOSCOPY WITH ENDOBRONCHIAL ULTRASOUND N/A 12/15/2017   Procedure: VIDEO BRONCHOSCOPY WITH ENDOBRONCHIAL ULTRASOUND;  Surgeon: Melrose Nakayama, MD;  Location: [facility] OR;  Service: Thoracic;  Laterality: N/A;    FAMILY HISTORY: family history includes Cancer in his sister; Colon cancer in his father; Diabetes in his brother; Heart disease in his brother, father, mother, and sister; Kidney disease in his brother; Prostate cancer in his father.  SOCIAL HISTORY:  reports that he quit smoking about 17 months ago. His smoking use included cigarettes and e-cigarettes. He has a 51.00 pack-year smoking history. He has never used smokeless  tobacco. He reports current alcohol use of about 7.0 standard drinks of alcohol per week. He reports that he does not use drugs.   ALLERGIES: Celebrex [celecoxib]  MEDICATIONS:  Current Outpatient Medications  Medication Sig Dispense Refill  . albuterol (PROVENTIL HFA;VENTOLIN HFA) 108 (90 BASE) MCG/ACT inhaler Inhale 2 puffs into the lungs every 6 (six) hours as needed for wheezing or shortness of breath. (Patient taking differently: Inhale 1 puff into the lungs every 6 (six) hours as needed for wheezing or shortness of breath. ) 1 Inhaler 3  . aspirin 81 MG EC tablet Take 162 mg by mouth daily.     . baclofen (LIORESAL) 20 MG tablet TAKE 1 TABLET (20 MG TOTAL) BY MOUTH 2 (TWO) TIMES DAILY AS NEEDED FOR MUSCLE SPASMS. 180 tablet 0  . clonazePAM (KLONOPIN) 0.5 MG tablet Take 1 tablet (0.5 mg total) by mouth at bedtime as needed. 30 tablet 2  . diphenhydrAMINE (BENADRYL) 25 mg capsule Take 25 mg by mouth at bedtime.     Marland Kitchen ketoconazole (NIZORAL) 2 % cream Apply 1 application topically 2 (two) times daily as needed for irritation.    Marland Kitchen lisinopril (PRINIVIL,ZESTRIL) 10 MG tablet Take 1 tablet (10 mg total) by mouth daily. 90 tablet 3  . Melatonin 10 MG TABS Take 10 mg by mouth at bedtime.     . metoprolol succinate (TOPROL-XL) 50 MG 24 hr tablet TAKE 1 TABLET (50 MG TOTAL) BY MOUTH DAILY. TAKE WITH OR IMMEDIATELY FOLLOWING A MEAL. 90 tablet 3  . nitroGLYCERIN (NITROSTAT) 0.4 MG SL tablet Place 1 tablet (0.4 mg total) under the tongue every 5 (five) minutes as needed for chest pain. 25 tablet 1  . simvastatin (ZOCOR) 40 MG tablet Take 1 tablet (40 mg total) by mouth at bedtime. 90 tablet 3  . vitamin E 100 UNIT capsule Take 100 Units by mouth daily.     No current facility-administered medications for this encounter.     REVIEW OF SYSTEMS:  A 10+ POINT REVIEW OF SYSTEMS WAS OBTAINED including neurology, dermatology, psychiatry, cardiac, respiratory, lymph, extremities, GI, GU, musculoskeletal, constitutional, reproductive, HEENT. All pertinent positives are noted in the HPI. All others are negative.    PHYSICAL EXAM:  height  is 5' 11"  (1.803 m) and weight is 278 lb 3.2 oz (126.2 kg). His oral temperature is 99.1 F (37.3 C). His blood pressure is 101/49 (abnormal) and his pulse is 82. His respiration is 24 (abnormal) and oxygen saturation is 98%.   General: Alert and oriented, in no acute distress HEENT: Head is normocephalic. Extraocular movements are intact. Oropharynx is clear. Neck: Neck is supple, no palpable cervical or supraclavicular lymphadenopathy. Heart: Regular in rate and rhythm with no murmurs, rubs, or gallops. Chest: Clear to auscultation bilaterally, with no rhonchi, wheezes, or rales. Right chest pt has a horizontal scar form his lobectomy which has healed well, without signs of chest wall recurrence.  Abdomen: Soft, nontender, nondistended, with no rigidity or guarding. Extremities: No cyanosis or edema. Lymphatics: see Neck Exam Skin: No concerning lesions. Musculoskeletal: symmetric strength and muscle tone throughout. Neurologic: Cranial nerves II through XII are grossly intact. No obvious focalities. Speech is fluent. Coordination is intact. Pt has a significant tremor when talking but this tremor stops when he is not talking. Psychiatric: Judgment and insight are intact. Affect is appropriate.   ECOG = 1  0 - Asymptomatic (Fully active, able to carry on all predisease activities without restriction)  1 - Symptomatic but completely ambulatory (  Restricted in physically strenuous activity but ambulatory and able to carry out work of a light or sedentary nature. For example, light housework, office work)  2 - Symptomatic, <50% in bed during the day (Ambulatory and capable of all self care but unable to carry out any work activities. Up and about more than 50% of waking hours)  3 - Symptomatic, >50% in bed, but not bedbound (Capable of only limited self-care, confined to bed or chair 50% or more of waking hours)  4 - Bedbound (Completely disabled. Cannot carry on any self-care. Totally  confined to bed or chair)  5 - Death   Eustace Pen MM, Creech RH, Tormey DC, et al. [phone]). "Toxicity and response criteria of the [facility]". [place] Oncol. 5 (6): 649-55  LABORATORY DATA:  Lab Results  Component Value Date   WBC 6.7 06/09/2019   HGB 12.2 (L) 06/09/2019   HCT 36.6 (L) 06/09/2019   MCV 104.6 (H) 06/09/2019   PLT 172 06/09/2019   NEUTROABS 4.1 05/14/2019   Lab Results  Component Value Date   NA 140 05/14/2019   K 4.4 05/14/2019   CL 110 05/14/2019   CO2 21 (L) 05/14/2019   GLUCOSE 151 (H) 05/14/2019   CREATININE 1.24 05/14/2019   CALCIUM 9.3 05/14/2019      RADIOGRAPHY: Nm Pet Image Restag (ps) Skull Base To Thigh  Result Date: 05/25/2019 CLINICAL DATA:  Subsequent treatment strategy for non-small cell lung cancer. Enlarging left adrenal gland nodule. EXAM: NUCLEAR MEDICINE PET SKULL BASE TO THIGH TECHNIQUE: 13.89 mCi F-18 FDG was injected intravenously. Full-ring PET imaging was performed from the skull base to thigh after the radiotracer. CT data was obtained and used for attenuation correction and anatomic localization. Fasting blood glucose: 119 mg/dl COMPARISON:  02/12/2019 and 05/14/2019 CT scans FINDINGS: Mediastinal blood pool activity: SUV max 2.66 Liver activity: SUV max NA NECK: No hypermetabolic lymph nodes in the neck. Incidental CT findings: none CHEST: Status post right upper lobe and right middle lobe lobectomies. No findings suspicious for recurrent tumor. No enlarged or hypermetabolic mediastinal or hilar lymph nodes to suggest metastatic adenopathy. No worrisome pulmonary nodules. Incidental CT findings: Very small right pleural effusion. Stable aortic and three-vessel coronary artery calcifications. ABDOMEN/PELVIS: 4.7 cm left adrenal gland mass is hypermetabolic with SUV max of 3.53 consistent with metastatic disease. The right adrenal gland is normal. No worrisome hepatic lesions to suggest metastatic disease. No  abdominal/pelvic lymphadenopathy. Incidental CT findings: Stable left renal cyst and extensive vascular disease. SKELETON: No focal hypermetabolic activity to suggest skeletal metastasis. Incidental CT findings: none IMPRESSION: 1. 4.7 cm left adrenal gland mass is hypermetabolic and most consistent with a metastatic lesion. 2. No other findings for metastatic disease involving the neck, chest, abdomen or pelvis. 3. Stable extensive vascular disease. Electronically Signed   By: Marijo Sanes M.D.   On: 05/25/2019 09:58   Ct Biopsy  Result Date: 06/09/2019 CLINICAL DATA:  History of lung carcinoma. Hypermetabolic left adrenal mass on recent PET-CT. Biopsy requested EXAM: CT GUIDED CORE BIOPSY OF LEFT ADRENAL MASS ANESTHESIA/SEDATION: Intravenous Fentanyl 151mg and Versed 291mwere administered as conscious sedation during continuous monitoring of the patient?s level of consciousness and physiological / cardiorespiratory status by the radiology RN, with a total moderate sedation time of 30 minutes. PROCEDURE: The procedure risks, benefits, and alternatives were explained to the patient. Questions regarding the procedure were encouraged and answered. The patient understands and consents to the procedure. Patient placed in left  lateral decubitus position. Limited CT through the abdomen was obtained. An appropriate skin entry site was determined and marked. The operative field was prepped with Betadine/chlorhexidinein a sterile fashion, and a sterile drape was applied covering the operative field. A sterile gown and sterile gloves were used for the procedure. Local anesthesia was provided with 1% Lidocaine. Using angled CT fluoroscopic guidance, a 17 gauge trocar needle was advanced to the margin of the left adrenal mass. Representative core biopsy samples were obtained. The guide needle removed. Postprocedure scans show no hemorrhage, pneumothorax, or other apparent complication. COMPLICATIONS: None immediate  FINDINGS: Left adrenal mass localized. Representative core biopsy samples obtained as above. No pneumothorax or regional hemorrhage. IMPRESSION: Technically successful CT-guided core biopsy, left adrenal mass. Electronically Signed   By: Lucrezia Europe M.D.   On: 06/09/2019 13:32      IMPRESSION: Metastatic non-small cell lung cancer, adenocarcinoma initially diagnosed as stage IB (T2a, N0, M0) non-small cell lung cancer, adenocarcinoma presented with right lower lobe lung mass. 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 At this point he pt would appear to be a candidate for SBRT but will know for sure after he goes through the planning process.   Today, I talked to the patient  about the findings and work-up thus far.  We discussed the natural history of his condition and general treatment, highlighting the role of radiotherapy in the management.  We discussed the available radiation techniques, and focused on the details of logistics and delivery.  We reviewed the anticipated acute and late sequelae associated with radiation in this setting.  The patient was encouraged to ask questions that I answered to the best of my ability.  A patient consent form was discussed and signed.  We retained a copy for our records.  The patient would like to proceed with radiation and will be scheduled for CT simulation.   PLAN: He will be scheduled for SBRT simulation later this week. Anticipate 5 radiation treatments.     ------------------------------------------------  Blair Promise, PhD, MD      This document serves as a record of services personally performed by Gery Pray, MD. It was created on his behalf by Mary-Margaret Loma Messing, a trained medical scribe. The creation of this record is based on the scribe's personal observations and the  provider's statements to them. This document has been checked and approved by the attending provider.

## 2019-06-21 NOTE — Patient Instructions (Signed)
Coronavirus (COVID-19) Are you at risk?  Are you at risk for the Coronavirus (COVID-19)?  To be considered HIGH RISK for Coronavirus (COVID-19), you have to meet the following criteria:  . Traveled to [place], [place], [place], [place] or [place]; or in the [place] to [place], [place], [place], or [place]; and have fever, cough, and shortness of breath within the last 2 weeks of travel OR . Been in close contact with a person diagnosed with COVID-19 within the last 2 weeks and have fever, cough, and shortness of breath . IF YOU DO NOT MEET THESE CRITERIA, YOU ARE CONSIDERED LOW RISK FOR COVID-19.  What to do if you are HIGH RISK for COVID-19?  . If you are having a medical emergency, call 911. . Seek medical care right away. Before you go to a doctor's office, urgent care or emergency department, call ahead and tell them about your recent travel, contact with someone diagnosed with COVID-19, and your symptoms. You should receive instructions from your physician's office regarding next steps of care.  . When you arrive at healthcare provider, tell the healthcare staff immediately you have returned from visiting [place], [place], [place], [place] or [place]; or traveled in the [place] to [place], [place], [place], or [place]; in the last two weeks or you have been in close contact with a person diagnosed with COVID-19 in the last 2 weeks.   . Tell the health care staff about your symptoms: fever, cough and shortness of breath. . After you have been seen by a medical provider, you will be either: o Tested for (COVID-19) and discharged home on quarantine except to seek medical care if symptoms worsen, and asked to  - Stay home and avoid contact with others until you get your results (4-5 days)  - Avoid travel on public transportation if possible (such as bus, train, or airplane) or o Sent to the Emergency Department by EMS for evaluation, COVID-19 testing, and possible  admission depending on your condition and test results.  What to do if you are LOW RISK for COVID-19?  Reduce your risk of any infection by using the same precautions used for avoiding the common cold or flu:  . Wash your hands often with soap and warm water for at least 20 seconds.  If soap and water are not readily available, use an alcohol-based hand sanitizer with at least 60% alcohol.  . If coughing or sneezing, cover your mouth and nose by coughing or sneezing into the elbow areas of your shirt or coat, into a tissue or into your sleeve (not your hands). . Avoid shaking hands with others and consider head nods or verbal greetings only. . Avoid touching your eyes, nose, or mouth with unwashed hands.  . Avoid close contact with people who are sick. . Avoid places or events with large numbers of people in one location, like concerts or sporting events. . Carefully consider travel plans you have or are making. . If you are planning any travel outside or inside the [place], visit the CDC's Travelers' Health webpage for the latest health notices. . If you have some symptoms but not all symptoms, continue to monitor at home and seek medical attention if your symptoms worsen. . If you are having a medical emergency, call 911.   ADDITIONAL HEALTHCARE OPTIONS FOR PATIENTS  [facility] / e-Visit: [email or web address]         [facility]: [phone]  [facility]: [phone]                   [facility]: [phone]   

## 2019-06-23 ENCOUNTER — Encounter (HOSPITAL_COMMUNITY): Payer: Self-pay | Admitting: Internal Medicine

## 2019-06-23 ENCOUNTER — Other Ambulatory Visit: Payer: Self-pay

## 2019-06-23 ENCOUNTER — Ambulatory Visit
Admission: RE | Admit: 2019-06-23 | Discharge: 2019-06-23 | Disposition: A | Discharge status: Home / Self Care | Payer: PPO | Source: Ambulatory Visit | Attending: Radiation Oncology | Admitting: Radiation Oncology

## 2019-06-23 DIAGNOSIS — Z87891 Personal history of nicotine dependence: Secondary | ICD-10-CM | POA: Insufficient documentation

## 2019-06-23 DIAGNOSIS — C7972 Secondary malignant neoplasm of left adrenal gland: Secondary | ICD-10-CM | POA: Diagnosis not present

## 2019-06-23 DIAGNOSIS — C3432 Malignant neoplasm of lower lobe, left bronchus or lung: Secondary | ICD-10-CM | POA: Diagnosis not present

## 2019-06-23 DIAGNOSIS — Z510 Encounter for antineoplastic radiation therapy: Secondary | ICD-10-CM | POA: Insufficient documentation

## 2019-06-29 DIAGNOSIS — Z510 Encounter for antineoplastic radiation therapy: Secondary | ICD-10-CM | POA: Diagnosis not present

## 2019-06-29 DIAGNOSIS — C7972 Secondary malignant neoplasm of left adrenal gland: Secondary | ICD-10-CM | POA: Diagnosis not present

## 2019-06-30 ENCOUNTER — Ambulatory Visit
Admission: RE | Admit: 2019-06-30 | Discharge: 2019-06-30 | Disposition: A | Discharge status: Home / Self Care | Payer: PPO | Source: Ambulatory Visit | Attending: Radiation Oncology | Admitting: Radiation Oncology

## 2019-06-30 DIAGNOSIS — Z510 Encounter for antineoplastic radiation therapy: Secondary | ICD-10-CM | POA: Diagnosis not present

## 2019-06-30 DIAGNOSIS — C7972 Secondary malignant neoplasm of left adrenal gland: Secondary | ICD-10-CM

## 2019-06-30 DIAGNOSIS — C801 Malignant (primary) neoplasm, unspecified: Secondary | ICD-10-CM

## 2019-06-30 NOTE — Progress Notes (Signed)
  Radiation Oncology         [phone] ________________________________  Name: Christian [name] MRN: [id number]  Date: 06/30/2019  DOB: Sep 10, 1946  Stereotactic Body Radiotherapy Treatment Procedure Note  NARRATIVE:  Christian [name] was brought to the stereotactic radiation treatment machine and placed supine on the CT couch. The patient was set up for stereotactic body radiotherapy on the body fix pillow.  3D TREATMENT PLANNING AND DOSIMETRY:  The patient's radiation plan was reviewed and approved prior to starting treatment.  It showed 3-dimensional radiation distributions overlaid onto the planning CT.  The [facility] for the target structures as well as the organs at risk were reviewed. The documentation of this is filed in the radiation oncology EMR.  SIMULATION VERIFICATION:  The patient underwent CT imaging on the treatment unit.  These were carefully aligned to document that the ablative radiation dose would cover the target volume and maximally spare the nearby organs at risk according to the planned distribution.  SPECIAL TREATMENT PROCEDURE: Christian [name] received high dose ablative stereotactic body radiotherapy to the planned target volume without unforeseen complications. Treatment was delivered uneventfully. The high doses associated with stereotactic body radiotherapy and the significant potential risks require careful treatment set up and patient monitoring constituting a special treatment procedure   STEREOTACTIC TREATMENT MANAGEMENT:  Following delivery, the patient was evaluated clinically. The patient tolerated treatment without significant acute effects, and was discharged to home in stable condition.    PLAN: Continue treatment as planned.  ________________________________  Blair Promise, PhD, MD   This document serves as a record of services personally performed by Gery Pray, MD. It was created on his behalf by Wilburn Mylar, a trained medical scribe. The  creation of this record is based on the scribe's personal observations and the provider's statements to them. This document has been checked and approved by the attending provider.

## 2019-07-02 ENCOUNTER — Other Ambulatory Visit: Payer: Self-pay

## 2019-07-02 ENCOUNTER — Ambulatory Visit
Admission: RE | Admit: 2019-07-02 | Discharge: 2019-07-02 | Disposition: A | Discharge status: Home / Self Care | Payer: PPO | Source: Ambulatory Visit | Attending: Radiation Oncology | Admitting: Radiation Oncology

## 2019-07-02 DIAGNOSIS — Z510 Encounter for antineoplastic radiation therapy: Secondary | ICD-10-CM | POA: Diagnosis not present

## 2019-07-05 ENCOUNTER — Ambulatory Visit
Admission: RE | Admit: 2019-07-05 | Discharge: 2019-07-05 | Disposition: A | Discharge status: Home / Self Care | Payer: PPO | Source: Ambulatory Visit | Attending: Radiation Oncology | Admitting: Radiation Oncology

## 2019-07-05 ENCOUNTER — Other Ambulatory Visit: Payer: Self-pay

## 2019-07-05 DIAGNOSIS — C7972 Secondary malignant neoplasm of left adrenal gland: Secondary | ICD-10-CM

## 2019-07-05 DIAGNOSIS — C801 Malignant (primary) neoplasm, unspecified: Secondary | ICD-10-CM

## 2019-07-05 DIAGNOSIS — Z510 Encounter for antineoplastic radiation therapy: Secondary | ICD-10-CM | POA: Diagnosis not present

## 2019-07-05 NOTE — Progress Notes (Signed)
  Radiation Oncology         [phone] ________________________________  Name: Christian [name] MRN: [id number]  Date: 07/05/2019  DOB: [birth year]  Stereotactic Body Radiotherapy Treatment Procedure Note  NARRATIVE:  Christian [name] was brought to the stereotactic radiation treatment machine and placed supine on the CT couch. The patient was set up for stereotactic body radiotherapy on the body fix pillow.  3D TREATMENT PLANNING AND DOSIMETRY:  The patient's radiation plan was reviewed and approved prior to starting treatment.  It showed 3-dimensional radiation distributions overlaid onto the planning CT.  The [facility] for the target structures as well as the organs at risk were reviewed. The documentation of this is filed in the radiation oncology EMR.  SIMULATION VERIFICATION:  The patient underwent CT imaging on the treatment unit.  These were carefully aligned to document that the ablative radiation dose would cover the target volume and maximally spare the nearby organs at risk according to the planned distribution.  SPECIAL TREATMENT PROCEDURE: Christian [name] received high dose ablative stereotactic body radiotherapy to the planned target volume without unforeseen complications. Treatment was delivered uneventfully. The high doses associated with stereotactic body radiotherapy and the significant potential risks require careful treatment set up and patient monitoring constituting a special treatment procedure   STEREOTACTIC TREATMENT MANAGEMENT:  Following delivery, the patient was evaluated clinically. The patient tolerated treatment without significant acute effects, and was discharged to home in stable condition.    PLAN: Continue treatment as planned.  ________________________________  Blair Promise, PhD, MD   This document serves as a record of services personally performed by Gery Pray, MD. It was created on his behalf by Mary-Margaret Loma Messing, a trained medical scribe. The  creation of this record is based on the scribe's personal observations and the provider's statements to them. This document has been checked and approved by the attending provider.

## 2019-07-07 ENCOUNTER — Ambulatory Visit
Admission: RE | Admit: 2019-07-07 | Discharge: 2019-07-07 | Disposition: A | Discharge status: Home / Self Care | Payer: PPO | Source: Ambulatory Visit | Attending: Radiation Oncology | Admitting: Radiation Oncology

## 2019-07-07 ENCOUNTER — Other Ambulatory Visit: Payer: Self-pay

## 2019-07-07 DIAGNOSIS — C7972 Secondary malignant neoplasm of left adrenal gland: Secondary | ICD-10-CM

## 2019-07-07 DIAGNOSIS — C801 Malignant (primary) neoplasm, unspecified: Secondary | ICD-10-CM

## 2019-07-07 DIAGNOSIS — Z510 Encounter for antineoplastic radiation therapy: Secondary | ICD-10-CM | POA: Diagnosis not present

## 2019-07-07 NOTE — Progress Notes (Signed)
  Radiation Oncology         [phone] ________________________________  Name: Christian [name] MRN: [id number]  Date: 07/07/2019  DOB: 01-04-1946  Stereotactic Body Radiotherapy Treatment Procedure Note  NARRATIVE:  Christian [name] was brought to the stereotactic radiation treatment machine and placed supine on the CT couch. The patient was set up for stereotactic body radiotherapy on the body fix pillow.  3D TREATMENT PLANNING AND DOSIMETRY:  The patient's radiation plan was reviewed and approved prior to starting treatment.  It showed 3-dimensional radiation distributions overlaid onto the planning CT.  The [facility] for the target structures as well as the organs at risk were reviewed. The documentation of this is filed in the radiation oncology EMR.  SIMULATION VERIFICATION:  The patient underwent CT imaging on the treatment unit.  These were carefully aligned to document that the ablative radiation dose would cover the target volume and maximally spare the nearby organs at risk according to the planned distribution.  SPECIAL TREATMENT PROCEDURE: Christian [name] received high dose ablative stereotactic body radiotherapy to the planned target volume without unforeseen complications. Treatment was delivered uneventfully. The high doses associated with stereotactic body radiotherapy and the significant potential risks require careful treatment set up and patient monitoring constituting a special treatment procedure   STEREOTACTIC TREATMENT MANAGEMENT:  Following delivery, the patient was evaluated clinically. The patient tolerated treatment without significant acute effects, and was discharged to home in stable condition.    PLAN: Continue treatment as planned.  ________________________________  Blair Promise, PhD, MD   This document serves as a record of services personally performed by Gery Pray, MD. It was created on his behalf by Mary-Margaret Loma Messing, a trained medical scribe. The  creation of this record is based on the scribe's personal observations and the provider's statements to them. This document has been checked and approved by the attending provider.

## 2019-07-09 ENCOUNTER — Ambulatory Visit
Admission: RE | Admit: 2019-07-09 | Discharge: 2019-07-09 | Disposition: A | Discharge status: Home / Self Care | Payer: PPO | Source: Ambulatory Visit | Attending: Radiation Oncology | Admitting: Radiation Oncology

## 2019-07-09 ENCOUNTER — Encounter: Payer: Self-pay | Admitting: Radiation Oncology

## 2019-07-09 ENCOUNTER — Other Ambulatory Visit: Payer: Self-pay

## 2019-07-09 DIAGNOSIS — C7972 Secondary malignant neoplasm of left adrenal gland: Secondary | ICD-10-CM | POA: Diagnosis not present

## 2019-07-09 DIAGNOSIS — Z510 Encounter for antineoplastic radiation therapy: Secondary | ICD-10-CM | POA: Diagnosis not present

## 2019-07-10 ENCOUNTER — Other Ambulatory Visit: Payer: Self-pay | Admitting: Family Medicine

## 2019-07-12 NOTE — Telephone Encounter (Signed)
Sent. Thanks.   

## 2019-07-12 NOTE — Telephone Encounter (Signed)
Last refilled on 04/14/2019 for #180 with 0 refill. LOV 08/28/18 and future appointment on 09/06/2019

## 2019-07-16 DIAGNOSIS — C7972 Secondary malignant neoplasm of left adrenal gland: Secondary | ICD-10-CM | POA: Diagnosis not present

## 2019-07-16 DIAGNOSIS — C3490 Malignant neoplasm of unspecified part of unspecified bronchus or lung: Secondary | ICD-10-CM | POA: Diagnosis not present

## 2019-07-22 ENCOUNTER — Encounter (HOSPITAL_COMMUNITY): Payer: Self-pay | Admitting: Internal Medicine

## 2019-07-25 ENCOUNTER — Other Ambulatory Visit: Payer: Self-pay | Admitting: Family Medicine

## 2019-07-25 DIAGNOSIS — G4700 Insomnia, unspecified: Secondary | ICD-10-CM

## 2019-07-26 NOTE — Telephone Encounter (Signed)
Electronic refill request. Clonazepam Last office visit:   08/28/2018 Last Filled:    30 tablet 2 01/17/2019  Please advise.

## 2019-07-27 NOTE — Telephone Encounter (Signed)
Sent. Thanks.  Has f/u pending.

## 2019-08-08 ENCOUNTER — Other Ambulatory Visit: Payer: Self-pay | Admitting: Family Medicine

## 2019-08-09 ENCOUNTER — Ambulatory Visit
Admission: RE | Admit: 2019-08-09 | Discharge: 2019-08-09 | Disposition: A | Discharge status: Home / Self Care | Payer: PPO | Source: Ambulatory Visit | Attending: Radiation Oncology | Admitting: Radiation Oncology

## 2019-08-09 NOTE — Progress Notes (Signed)
  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 He experienced resolution of his pain by the end of treatment. He reported mild to moderate fatigue. He denies nausea or vomiting.  Plan: The patient will follow-up with radiation oncology in 1 month.  ________________________________________________ -----------------------------------  Blair Promise, PhD, MD  This document serves as a record of services personally performed by Gery Pray, MD. It was created on his behalf by Wilburn Mylar, a trained medical scribe. The creation of this record is based on the scribe's personal observations and the provider's statements to them. This document has been checked and approved by the attending provider.

## 2019-08-13 IMAGING — CT CT CHEST W/ CM
2 of 3 series · 16 of 30 positions shown, 19 images · IV contrast (75CC ISOVUE 300)
Comparison: Radiographs November 10, 2017.

CLINICAL DATA: Right upper lobe nodule.

EXAM:
CT CHEST WITH CONTRAST
TECHNIQUE: Multidetector CT imaging of the chest was performed during
intravenous contrast administration.
CONTRAST:  75mL JI3HEZ-CJJ IOPAMIDOL (JI3HEZ-CJJ) INJECTION 61%

[Series 3: chest with · axial · 0.96mm/px · z∈[-288,+10]mm · 10 of 147 slices shown, 13 images]
[im 14/147  mediastinal]
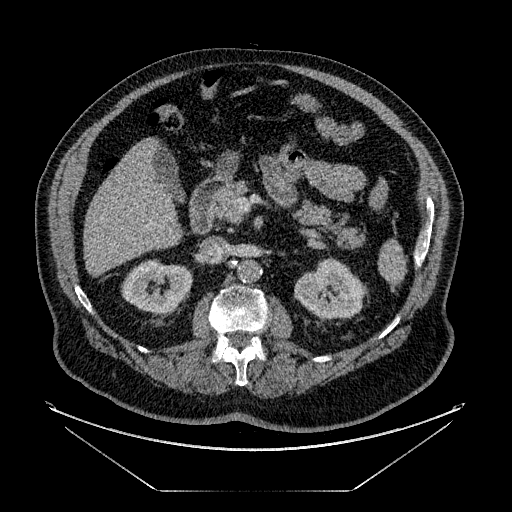
[im 14/147  lung]
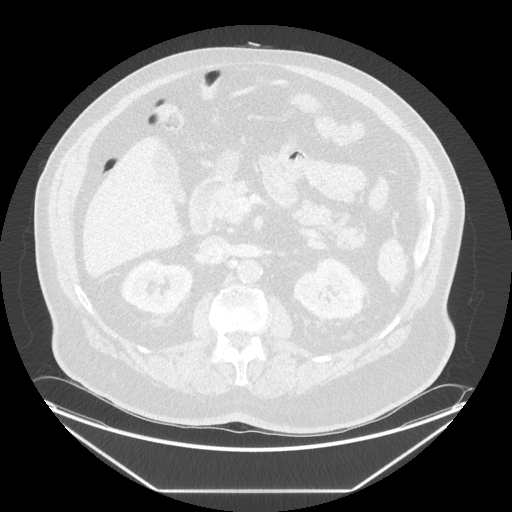
[im 27/147  lung]
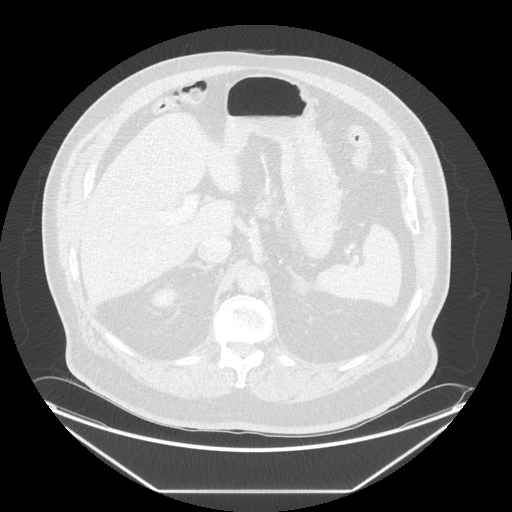
[im 40/147  lung]
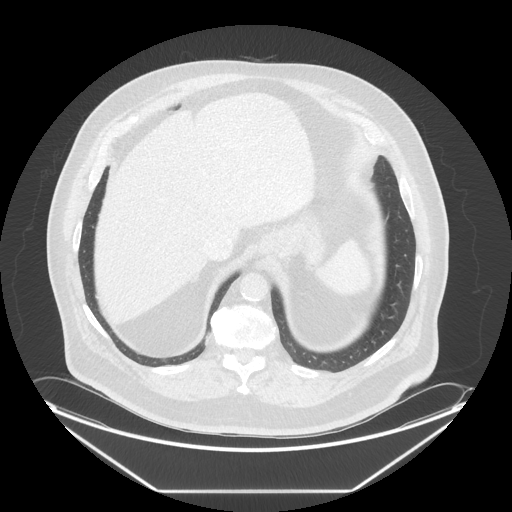
[im 54/147  lung]
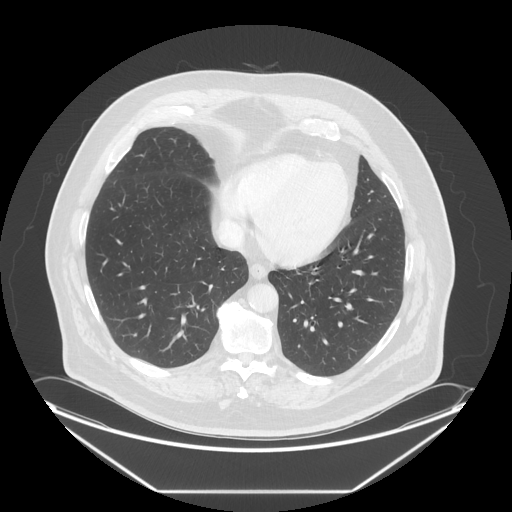
[im 67/147  mediastinal]
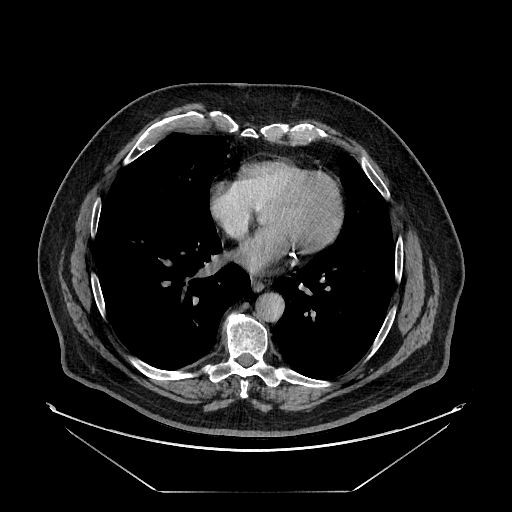
[im 67/147  lung]
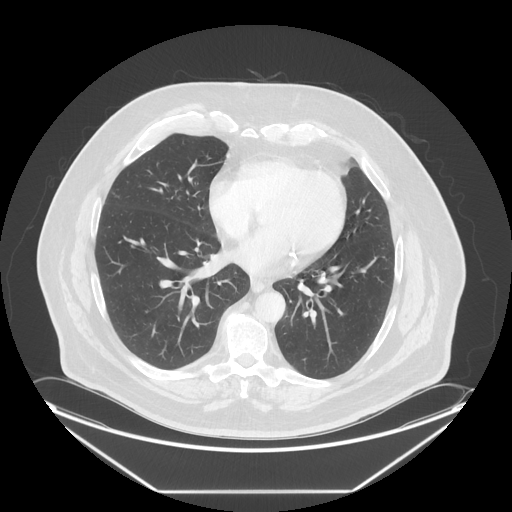
[im 80/147  lung]
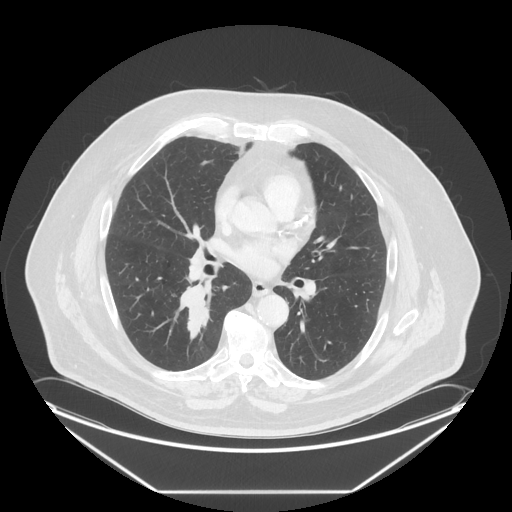
[im 93/147  lung]
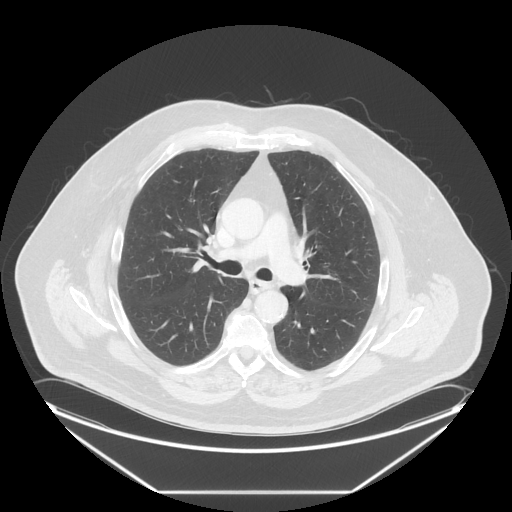
[im 107/147  lung]
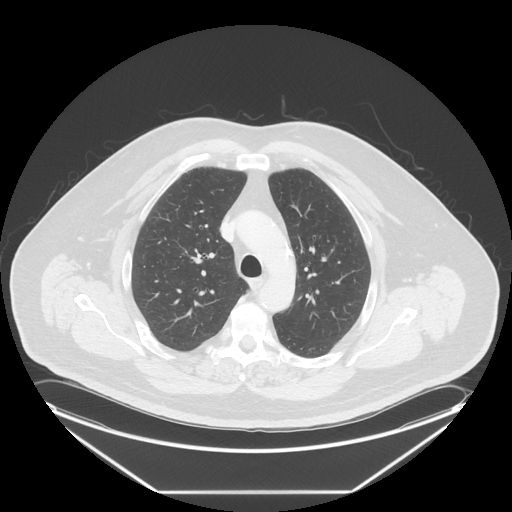
[im 120/147  mediastinal]
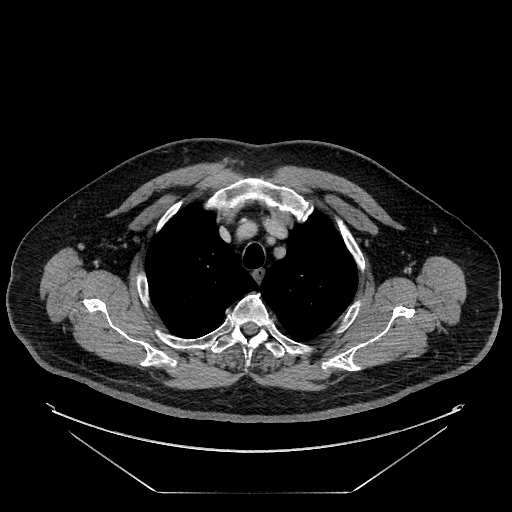
[im 120/147  lung]
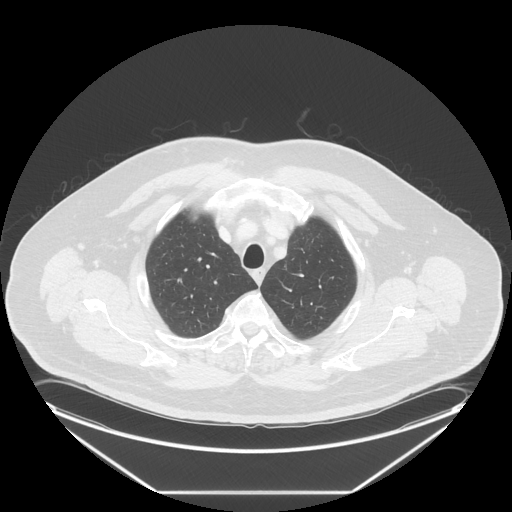
[im 133/147  lung]
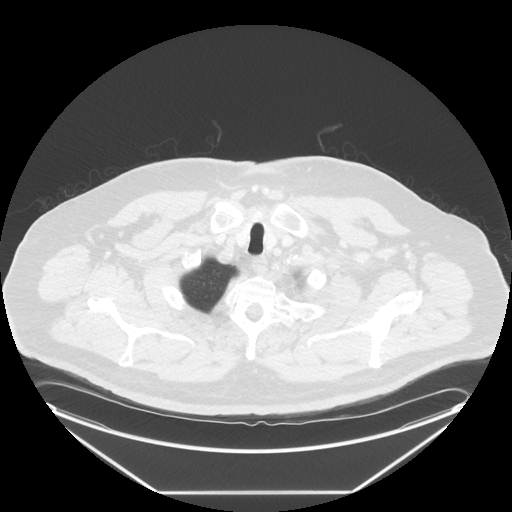

[Series 602: sagittal body · sagittal · 0.96mm/px · 6 of 198 slices shown]
[im 14/198  mediastinal]
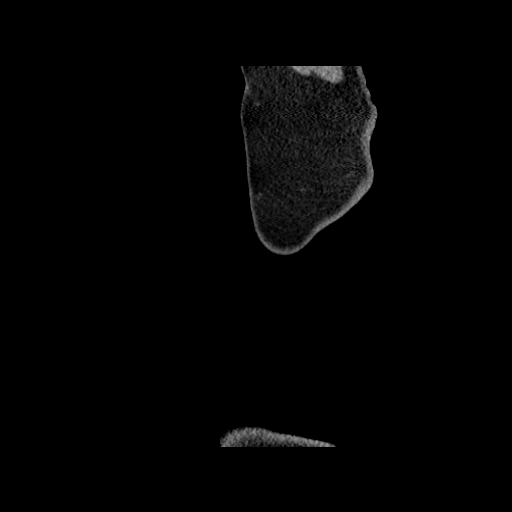
[im 40/198  mediastinal]
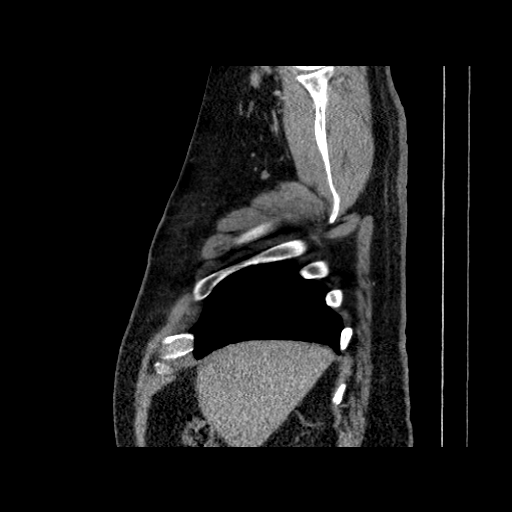
[im 66/198  mediastinal]
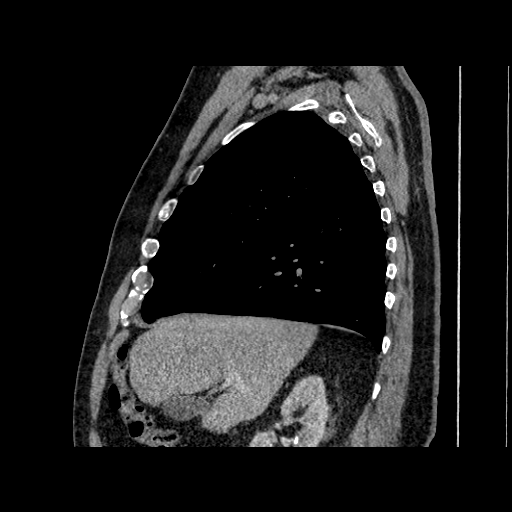
[im 92/198  mediastinal]
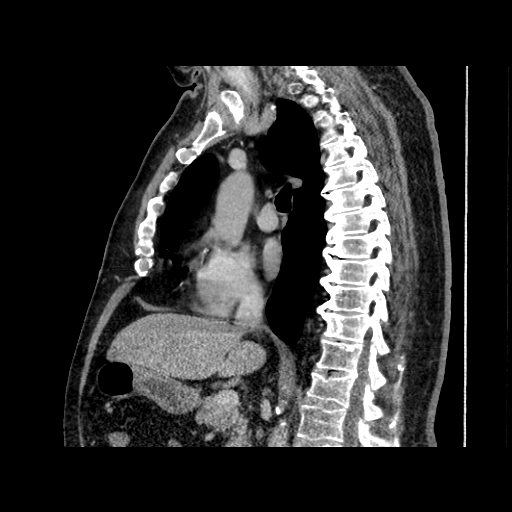
[im 106/198  mediastinal]
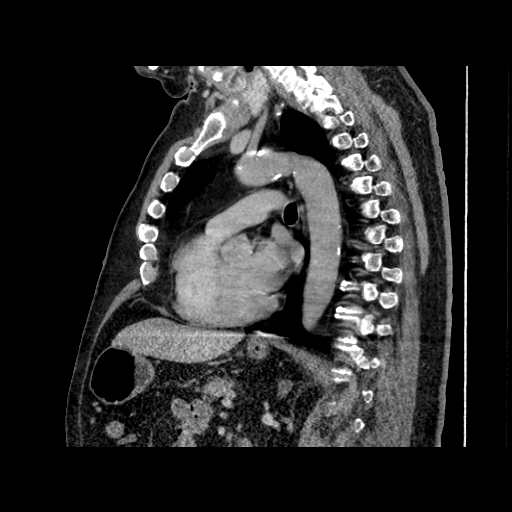
[im 132/198  mediastinal]
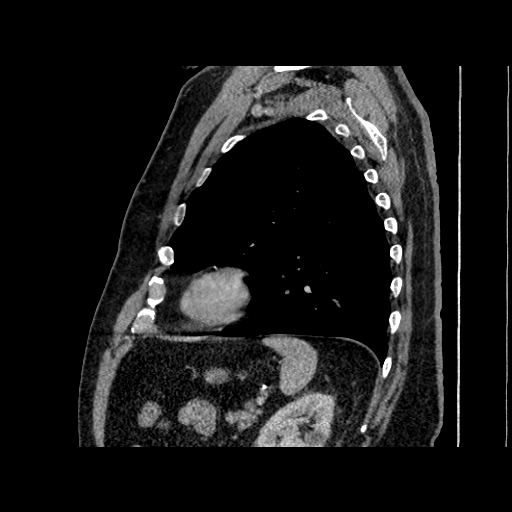

[16 of 30 positions shown; findings below may reference images not displayed]

FINDINGS: Cardiovascular: 4.2 cm ascending thoracic aortic aneurysm is noted.
Atherosclerosis of thoracic aorta is noted. Normal cardiac size. No
pericardial effusion is noted. Coronary artery calcifications are
noted suggesting coronary artery disease.

Mediastinum/Nodes: No enlarged mediastinal, hilar, or axillary lymph
nodes. Thyroid gland, trachea, and esophagus demonstrate no
significant findings.

Lungs/Pleura: No pneumothorax or pleural effusion is noted. 4.9 x
2.0 cm lobulated and slightly spiculated density is seen in the
right lower lobe posterior to hilum concerning for malignancy.

Upper Abdomen: No acute abnormality.

Musculoskeletal: No chest wall abnormality. No acute or significant
osseous findings.
IMPRESSION: 4.9 x 2.0 cm lobulated and slightly spiculated density seen in right
lower lobe concerning for malignancy. PET scan is recommended for
further evaluation. These results will be called to the ordering
clinician or representative by the Radiologist Assistant, and
communication documented in the PACS or zVision Dashboard.

4.2 cm ascending thoracic aortic aneurysm. Recommend annual imaging
followup by CTA or MRA. This recommendation follows 6575
ACCF/AHA/AATS/ACR/ASA/SCA/JUNIO/MACKEY/IRILDA/OSAWA Guidelines for the
Diagnosis and Management of Patients with Thoracic Aortic Disease.
Circulation. 6575; 121: [id number].

Aortic Atherosclerosis (I8Y90-3Q4.4).

## 2019-08-26 ENCOUNTER — Other Ambulatory Visit: Payer: Self-pay | Admitting: Family Medicine

## 2019-08-26 NOTE — Telephone Encounter (Signed)
Electronic refill request. Simvastatin Last office visit:   08/28/2018 Last Filled:    90 tablet 3 08/28/2018  No upcoming appts scheduled.  Please advise.  Looks like patient has been seeing Oncology   Electronic refill request. Lisinopril Last office visit:   08/28/2018 Last Filled:    90 tablet 3 08/28/2018  No upcoming appts scheduled.  Please advise. Looks like patient has been seeing Oncology

## 2019-08-27 NOTE — Telephone Encounter (Signed)
Sent.  Needs 51min OV this fall when possible.  Thanks.

## 2019-08-27 NOTE — Telephone Encounter (Signed)
Pt didn't want to schedule in Oct due to him already having a lot of appointments he is scheduled for 10/21/19 @ 11:30am.

## 2019-08-27 NOTE — Telephone Encounter (Signed)
Please schedule appointment as instructed. 

## 2019-08-31 ENCOUNTER — Other Ambulatory Visit: Payer: Self-pay | Admitting: Family Medicine

## 2019-08-31 NOTE — Telephone Encounter (Signed)
Electronic refill request. Baclofen Last office visit:   08/28/2018  Upcoming appt scheduled in November 2020 Last Filled:    180 tablet 0 07/12/2019  Please advise.

## 2019-09-01 NOTE — Telephone Encounter (Signed)
Please have patient check with pharmacy.  That last prescription should carry him through November.  Thanks.

## 2019-09-02 NOTE — Telephone Encounter (Signed)
No answer, no VM available.  Will try again tomorrow.

## 2019-09-06 ENCOUNTER — Encounter: Payer: PPO | Admitting: Family Medicine

## 2019-09-06 ENCOUNTER — Ambulatory Visit: Payer: PPO

## 2019-09-08 ENCOUNTER — Telehealth: Payer: Self-pay | Admitting: Medical Oncology

## 2019-09-08 NOTE — Telephone Encounter (Signed)
Contrast pickup - I instructed pt to pickup contrast at front check-in. Contrast left up front.

## 2019-09-13 ENCOUNTER — Encounter (HOSPITAL_COMMUNITY): Payer: Self-pay

## 2019-09-13 ENCOUNTER — Ambulatory Visit (HOSPITAL_COMMUNITY)
Admission: RE | Admit: 2019-09-13 | Discharge: 2019-09-13 | Disposition: A | Discharge status: Home / Self Care | Payer: PPO | Source: Ambulatory Visit | Attending: Internal Medicine | Admitting: Internal Medicine

## 2019-09-13 ENCOUNTER — Other Ambulatory Visit: Payer: Self-pay

## 2019-09-13 ENCOUNTER — Inpatient Hospital Stay: Payer: PPO | Attending: Internal Medicine

## 2019-09-13 DIAGNOSIS — C7972 Secondary malignant neoplasm of left adrenal gland: Secondary | ICD-10-CM | POA: Insufficient documentation

## 2019-09-13 DIAGNOSIS — C3490 Malignant neoplasm of unspecified part of unspecified bronchus or lung: Secondary | ICD-10-CM | POA: Insufficient documentation

## 2019-09-13 DIAGNOSIS — R0609 Other forms of dyspnea: Secondary | ICD-10-CM | POA: Insufficient documentation

## 2019-09-13 DIAGNOSIS — R5383 Other fatigue: Secondary | ICD-10-CM | POA: Diagnosis not present

## 2019-09-13 DIAGNOSIS — R05 Cough: Secondary | ICD-10-CM | POA: Insufficient documentation

## 2019-09-13 DIAGNOSIS — C342 Malignant neoplasm of middle lobe, bronchus or lung: Secondary | ICD-10-CM | POA: Diagnosis not present

## 2019-09-13 DIAGNOSIS — C3431 Malignant neoplasm of lower lobe, right bronchus or lung: Secondary | ICD-10-CM | POA: Diagnosis not present

## 2019-09-13 DIAGNOSIS — C801 Malignant (primary) neoplasm, unspecified: Secondary | ICD-10-CM | POA: Diagnosis not present

## 2019-09-13 LAB — CMP (CANCER CENTER ONLY)
ALT: 24 U/L (ref 0–44)
AST: 20 U/L (ref 15–41)
Albumin: 4 g/dL (ref 3.5–5.0)
Alkaline Phosphatase: 75 U/L (ref 38–126)
Anion gap: 8 (ref 5–15)
BUN: 20 mg/dL (ref 8–23)
CO2: 23 mmol/L (ref 22–32)
Calcium: 9.5 mg/dL (ref 8.9–10.3)
Chloride: 106 mmol/L (ref 98–111)
Creatinine: 1.29 mg/dL — ABNORMAL HIGH (ref 0.61–1.24)
GFR, Est AFR Am: >60 mL/min (ref >60)
GFR, Estimated: 55 mL/min — ABNORMAL LOW (ref >60)
Glucose, Bld: 216 mg/dL — ABNORMAL HIGH (ref 70–99)
Potassium: 4.8 mmol/L (ref 3.5–5.1)
Sodium: 137 mmol/L (ref 135–145)
Total Bilirubin: 0.4 mg/dL (ref 0.3–1.2)
Total Protein: 7.2 g/dL (ref 6.5–8.1)

## 2019-09-13 LAB — CBC WITH DIFFERENTIAL (CANCER CENTER ONLY)
Abs Immature Granulocytes: 0.04 10*3/uL (ref 0.00–0.07)
Basophils Absolute: 0.1 10*3/uL (ref 0.0–0.1)
Basophils Relative: 1 %
Eosinophils Absolute: 0.3 10*3/uL (ref 0.0–0.5)
Eosinophils Relative: 5 %
HCT: 37.9 % — ABNORMAL LOW (ref 39.0–52.0)
Hemoglobin: 12.5 g/dL — ABNORMAL LOW (ref 13.0–17.0)
Immature Granulocytes: 1 %
Lymphocytes Relative: 17 %
Lymphs Abs: 1.1 10*3/uL (ref 0.7–4.0)
MCH: 34.4 pg — ABNORMAL HIGH (ref 26.0–34.0)
MCHC: 33 g/dL (ref 30.0–36.0)
MCV: 104.4 fL — ABNORMAL HIGH (ref 80.0–100.0)
Monocytes Absolute: 0.7 10*3/uL (ref 0.1–1.0)
Monocytes Relative: 12 %
Neutro Abs: 4 10*3/uL (ref 1.7–7.7)
Neutrophils Relative %: 64 %
Platelet Count: 154 10*3/uL (ref 150–400)
RBC: 3.63 MIL/uL — ABNORMAL LOW (ref 4.22–5.81)
RDW: 13.4 % (ref 11.5–15.5)
WBC Count: 6.3 10*3/uL (ref 4.0–10.5)
nRBC: 0.6 % — ABNORMAL HIGH (ref 0.0–0.2)

## 2019-09-13 MED ORDER — IOHEXOL 300 MG/ML  SOLN
100.0000 mL | Freq: Once | INTRAMUSCULAR | Status: AC | PRN
  Administered 2019-09-13: 12:00:00 100 mL via INTRAVENOUS

## 2019-09-13 MED ORDER — SODIUM CHLORIDE (PF) 0.9 % IJ SOLN
INTRAMUSCULAR | Status: AC
  Filled 2019-09-13: qty 50

## 2019-09-15 ENCOUNTER — Telehealth: Payer: Self-pay | Admitting: Internal Medicine

## 2019-09-15 ENCOUNTER — Other Ambulatory Visit: Payer: Self-pay

## 2019-09-15 ENCOUNTER — Inpatient Hospital Stay (HOSPITAL_BASED_OUTPATIENT_CLINIC_OR_DEPARTMENT_OTHER): Payer: PPO | Admitting: Internal Medicine

## 2019-09-15 ENCOUNTER — Encounter: Payer: Self-pay | Admitting: Internal Medicine

## 2019-09-15 VITALS — BP 119/100 | HR 70 | Temp 98.2°F | Resp 18 | Ht 71.0 in | Wt 278.9 lb

## 2019-09-15 DIAGNOSIS — C7972 Secondary malignant neoplasm of left adrenal gland: Secondary | ICD-10-CM

## 2019-09-15 DIAGNOSIS — C3431 Malignant neoplasm of lower lobe, right bronchus or lung: Secondary | ICD-10-CM | POA: Diagnosis not present

## 2019-09-15 DIAGNOSIS — C3491 Malignant neoplasm of unspecified part of right bronchus or lung: Secondary | ICD-10-CM

## 2019-09-15 DIAGNOSIS — C3490 Malignant neoplasm of unspecified part of unspecified bronchus or lung: Secondary | ICD-10-CM | POA: Diagnosis not present

## 2019-09-15 NOTE — Telephone Encounter (Signed)
Scheduled apt per 10/7 los - mailed reminder letter with appt date and time . Central radiology to contact patient with appt date and time

## 2019-09-15 NOTE — Progress Notes (Signed)
[facility] [phone]   [phone]  OFFICE PROGRESS NOTE  [name], MD [place] [address]  DIAGNOSIS: Metastatic non-small cell lung cancer, adenocarcinoma initially diagnosed as stage IB (T2a, N0, M0) non-small cell lung cancer, adenocarcinoma presented with right lower lobe lung mass.  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  Biomarker Findings Tumor Mutational Burden - 11 Muts/Mb Microsatellite status - MS-Stable Genomic Findings For a complete list of the genes assayed, please refer to the Appendix. AKT3 amplification STK11 P252f*48 MDM4 amplification - equivocal? IKBKE amplification - equivocal? PARK2 [id number] PARP1 amplification - equivocal? PIK3C2B amplification - equivocal? SF3B1 K700E 8 Disease relevant genes with no reportable alterations: ALK, BRAF, EGFR, ERBB2, KRAS, MET, RET, ROS1  PDL 1 expression 0%.  PRIOR THERAPY: 1) Status post right middle and lower bilobectomies with lymph node dissection under the care of Dr. HeRoxan Hockeyn January 16, 2018. 2) status post SBRT to the metastatic lesion in the left adrenal gland in July 2020 under the care of Dr. KiSondra Come CURRENT THERAPY: Observation.  INTERVAL HISTORY: Christian ZAPIEN18.o. male returns to the clinic today for follow-up visit.  The patient is feeling fine today with no concerning complaints except for the baseline shortness of breath increased with exertion with mild cough but no chest pain or hemoptysis.  He also has generalized fatigue.  He denied having any fever or chills.  He has no nausea, vomiting, diarrhea or constipation.  He has no headache or visual changes.  He completed a course of SBRT to the left adrenal gland metastasis in July 2020.  He is here today for evaluation with repeat CT scan of the chest, abdomen and pelvis for restaging of his disease.  MEDICAL HISTORY: Past Medical  History:  Diagnosis Date  . Anxiety   . Arthritis   . Cancer ([facility]   skin  . Cancer ([facility]   right lung  . Coronary artery disease   . Diabetes mellitus type 2 with complications ([facility]   not on medication was "taken off of list"; pt states that he was pre diabetic  . Dyspnea    walks 100 yards then rest  . GERD (gastroesophageal reflux disease)    ocassional no meds; pt states that its no longer an issue (01/14/18)  . History of shingles    2004  . Hyperlipidemia   . Hypertension   . OSA on CPAP    pt states that he stopped wearing his CPAP  . Pneumonia 10/2017    ALLERGIES:  is allergic to celebrex [celecoxib].  MEDICATIONS:  Current Outpatient Medications  Medication Sig Dispense Refill  . albuterol (PROVENTIL HFA;VENTOLIN HFA) 108 (90 BASE) MCG/ACT inhaler Inhale 2 puffs into the lungs every 6 (six) hours as needed for wheezing or shortness of breath. (Patient taking differently: Inhale 1 puff into the lungs every 6 (six) hours as needed for wheezing or shortness of breath. ) 1 Inhaler 3  . aspirin 81 MG EC tablet Take 162 mg by mouth daily.     . baclofen (LIORESAL) 20 MG tablet TAKE 1 TABLET (20 MG TOTAL) BY MOUTH 2 (TWO) TIMES DAILY AS NEEDED FOR MUSCLE SPASMS. 180 tablet 0  . clonazePAM (KLONOPIN) 0.5 MG tablet TAKE 1 TABLET (0.5 MG TOTAL) BY MOUTH AT BEDTIME AS NEEDED. 30 tablet 1  . diphenhydrAMINE (BENADRYL) 25 mg capsule Take 25 mg  by mouth at bedtime.     Marland Kitchen ketoconazole (NIZORAL) 2 % cream Apply 1 application topically 2 (two) times daily as needed for irritation.    Marland Kitchen lisinopril (ZESTRIL) 10 MG tablet TAKE 1 TABLET BY MOUTH EVERY DAY 90 tablet 1  . Melatonin 10 MG TABS Take 10 mg by mouth at bedtime.     . metoprolol succinate (TOPROL-XL) 50 MG 24 hr tablet TAKE 1 TABLET (50 MG TOTAL) BY MOUTH DAILY. TAKE WITH OR IMMEDIATELY FOLLOWING A MEAL. 90 tablet 0  . nitroGLYCERIN (NITROSTAT) 0.4 MG SL tablet Place 1 tablet (0.4 mg total) under the tongue every 5 (five)  minutes as needed for chest pain. 25 tablet 1  . simvastatin (ZOCOR) 40 MG tablet TAKE 1 TABLET BY MOUTH EVERYDAY AT BEDTIME 90 tablet 1  . vitamin E 100 UNIT capsule Take 100 Units by mouth daily.     No current facility-administered medications for this visit.     SURGICAL HISTORY:  Past Surgical History:  Procedure Laterality Date  . CARDIAC CATHETERIZATION     2010 @ [facility] (per pt)  . COLONOSCOPY W/ POLYPECTOMY    . EYE SURGERY Bilateral    cataracts  . OPEN REDUCTION INTERNAL FIXATION (ORIF) DISTAL RADIAL FRACTURE Right 11/26/2013   Procedure: OPEN REDUCTION INTERNAL FIXATION (ORIF) DISTAL RADIAL FRACTURE;  Surgeon: Newt Minion, MD;  Location: [facility];  Service: Orthopedics;  Laterality: Right;  Open Reduction Internal Fixation Right Distal Radius   . VIDEO ASSISTED THORACOSCOPY (VATS)/ LOBECTOMY Right 01/16/2018   Procedure: VIDEO ASSISTED THORACOSCOPY (VATS)/RIGHT MIDDLE AND LOWER LOBE LUNG LOBECTOMY WITH NODE BIOPSIES x6;  Surgeon: Melrose Nakayama, MD;  Location: [facility];  Service: Thoracic;  Laterality: Right;  Marland Kitchen VIDEO BRONCHOSCOPY WITH ENDOBRONCHIAL NAVIGATION Right 12/15/2017   Procedure: VIDEO BRONCHOSCOPY WITH ENDOBRONCHIAL NAVIGATION;  Surgeon: Melrose Nakayama, MD;  Location: [facility];  Service: Thoracic;  Laterality: Right;  Marland Kitchen VIDEO BRONCHOSCOPY WITH ENDOBRONCHIAL ULTRASOUND N/A 12/15/2017   Procedure: VIDEO BRONCHOSCOPY WITH ENDOBRONCHIAL ULTRASOUND;  Surgeon: Melrose Nakayama, MD;  Location: [facility];  Service: Thoracic;  Laterality: N/A;    REVIEW OF SYSTEMS:  A comprehensive review of systems was negative except for: Constitutional: positive for fatigue Respiratory: positive for dyspnea on exertion   PHYSICAL EXAMINATION: General appearance: alert, cooperative, fatigued and no distress Head: Normocephalic, without obvious abnormality, atraumatic Neck: no adenopathy, no JVD, supple, symmetrical, trachea midline and thyroid not enlarged, symmetric, no  tenderness/mass/nodules Lymph nodes: Cervical, supraclavicular, and axillary nodes normal. Resp: clear to auscultation bilaterally Back: symmetric, no curvature. ROM normal. No CVA tenderness. Cardio: regular rate and rhythm, S1, S2 normal, no murmur, click, rub or gallop GI: soft, non-tender; bowel sounds normal; no masses,  no organomegaly Extremities: extremities normal, atraumatic, no cyanosis or edema  ECOG PERFORMANCE STATUS: 1 - Symptomatic but completely ambulatory  Blood pressure (!) 119/100, pulse 70, temperature 98.2 F (36.8 C), temperature source Temporal, resp. rate 18, height _0  (1.803 m), weight 278 lb 14.4 oz (126.5 kg), SpO2 98 %.  LABORATORY DATA: Lab Results  Component Value Date   WBC 6.3 09/13/2019   HGB 12.5 (L) 09/13/2019   HCT 37.9 (L) 09/13/2019   MCV 104.4 (H) 09/13/2019   PLT 154 09/13/2019      Chemistry      Component Value Date/Time   NA 137 09/13/2019 1037   K 4.8 09/13/2019 1037   CL 106 09/13/2019 1037   CO2 23 09/13/2019 1037   BUN  20 09/13/2019 1037   CREATININE 1.29 (H) 09/13/2019 1037   CREATININE 0.99 07/11/2016 1448      Component Value Date/Time   CALCIUM 9.5 09/13/2019 1037   ALKPHOS 75 09/13/2019 1037   AST 20 09/13/2019 1037   ALT 24 09/13/2019 1037   BILITOT 0.4 09/13/2019 1037       RADIOGRAPHIC STUDIES: Ct Chest W Contrast  Result Date: 09/13/2019 CLINICAL DATA:  Metastatic non-small cell lung cancer of the right lower lobe. Metastatic disease to left adrenal gland. EXAM: CT CHEST, ABDOMEN, AND PELVIS WITH CONTRAST TECHNIQUE: Multidetector CT imaging of the chest, abdomen and pelvis was performed following the standard protocol during bolus administration of intravenous contrast. CONTRAST:  143m OMNIPAQUE IOHEXOL 300 MG/ML  SOLN COMPARISON:  PET-CT 05/24/2019 FINDINGS: CT CHEST FINDINGS Cardiovascular: The heart size is normal. No substantial pericardial effusion. Coronary artery calcification is evident.  Atherosclerotic calcification is noted in the wall of the thoracic aorta. Mediastinum/Nodes: No mediastinal lymphadenopathy. There is no hilar lymphadenopathy. The esophagus has normal imaging features. There is no axillary lymphadenopathy. Lungs/Pleura: Status post right middle and lower lobectomy. Centrilobular emphsyema noted. Calcified granuloma in the right lung on 84/7 is stable. No suspicious new nodule or mass in either lung. No focal consolidation. Chronic right pleural effusion has decreased in the interval. Musculoskeletal: No worrisome lytic or sclerotic osseous abnormality. CT ABDOMEN PELVIS FINDINGS Hepatobiliary: 9 mm hypoattenuating lesion in the dome of the left liver is stable. There is no evidence for gallstones, gallbladder wall thickening, or pericholecystic fluid. No intrahepatic or extrahepatic biliary dilation. Pancreas: No focal mass lesion. No dilatation of the main duct. No intraparenchymal cyst. No peripancreatic edema. Spleen: No splenomegaly. No focal mass lesion. Adrenals/Urinary Tract: Right adrenal gland unremarkable. Left adrenal mass measures 5.4 x 2.3 cm today compared to 4.6 x 2.3 cm when remeasured in a similar fashion on the prior study. Similar appearance 3.2 cm left renal cyst. Right kidney unremarkable. No evidence for hydroureter. The urinary bladder appears normal for the degree of distention. Stomach/Bowel: Stomach is unremarkable. No gastric wall thickening. No evidence of outlet obstruction. Duodenum is normally positioned as is the ligament of Treitz. No small bowel wall thickening. No small bowel dilatation. The terminal ileum is normal. The appendix is normal. No gross colonic mass. No colonic wall thickening. Diverticular changes are noted in the left colon without evidence of diverticulitis. Vascular/Lymphatic: There is abdominal aortic atherosclerosis without aneurysm. There is no gastrohepatic or hepatoduodenal ligament lymphadenopathy. No intraperitoneal or  retroperitoneal lymphadenopathy. No pelvic sidewall lymphadenopathy. Reproductive: The prostate gland is mildly enlarged. Seminal vesicles are unremarkable. Other: No intraperitoneal free fluid. Musculoskeletal: Small bilateral groin hernias contain only fat. No worrisome lytic or sclerotic osseous abnormality. Compression deformity at L1 and L4 is similar to CT of 05/14/2019. IMPRESSION: 1. Status post right middle and lower lobectomy. No evidence for local recurrence or metastatic disease in the chest. 2. Slight decrease in the chronic small right pleural effusion. 3. Left adrenal mass measures slightly larger in long axis today but is not substantially changed since 06/09/2019. 4. No findings to suggest new metastatic disease in the abdomen/pelvis. 5. Colonic diverticulosis without diverticulitis. 6.  Aortic Atherosclerois (ICD10-170.0) Electronically Signed   By: EMisty StanleyM.D.   On: 09/13/2019 13:18   Ct Abdomen Pelvis W Contrast  Result Date: 09/13/2019 CLINICAL DATA:  Metastatic non-small cell lung cancer of the right lower lobe. Metastatic disease to left adrenal gland. EXAM: CT CHEST, ABDOMEN, AND PELVIS WITH CONTRAST TECHNIQUE:  Multidetector CT imaging of the chest, abdomen and pelvis was performed following the standard protocol during bolus administration of intravenous contrast. CONTRAST:  122m OMNIPAQUE IOHEXOL 300 MG/ML  SOLN COMPARISON:  PET-CT 05/24/2019 FINDINGS: CT CHEST FINDINGS Cardiovascular: The heart size is normal. No substantial pericardial effusion. Coronary artery calcification is evident. Atherosclerotic calcification is noted in the wall of the thoracic aorta. Mediastinum/Nodes: No mediastinal lymphadenopathy. There is no hilar lymphadenopathy. The esophagus has normal imaging features. There is no axillary lymphadenopathy. Lungs/Pleura: Status post right middle and lower lobectomy. Centrilobular emphsyema noted. Calcified granuloma in the right lung on 84/7 is stable. No  suspicious new nodule or mass in either lung. No focal consolidation. Chronic right pleural effusion has decreased in the interval. Musculoskeletal: No worrisome lytic or sclerotic osseous abnormality. CT ABDOMEN PELVIS FINDINGS Hepatobiliary: 9 mm hypoattenuating lesion in the dome of the left liver is stable. There is no evidence for gallstones, gallbladder wall thickening, or pericholecystic fluid. No intrahepatic or extrahepatic biliary dilation. Pancreas: No focal mass lesion. No dilatation of the main duct. No intraparenchymal cyst. No peripancreatic edema. Spleen: No splenomegaly. No focal mass lesion. Adrenals/Urinary Tract: Right adrenal gland unremarkable. Left adrenal mass measures 5.4 x 2.3 cm today compared to 4.6 x 2.3 cm when remeasured in a similar fashion on the prior study. Similar appearance 3.2 cm left renal cyst. Right kidney unremarkable. No evidence for hydroureter. The urinary bladder appears normal for the degree of distention. Stomach/Bowel: Stomach is unremarkable. No gastric wall thickening. No evidence of outlet obstruction. Duodenum is normally positioned as is the ligament of Treitz. No small bowel wall thickening. No small bowel dilatation. The terminal ileum is normal. The appendix is normal. No gross colonic mass. No colonic wall thickening. Diverticular changes are noted in the left colon without evidence of diverticulitis. Vascular/Lymphatic: There is abdominal aortic atherosclerosis without aneurysm. There is no gastrohepatic or hepatoduodenal ligament lymphadenopathy. No intraperitoneal or retroperitoneal lymphadenopathy. No pelvic sidewall lymphadenopathy. Reproductive: The prostate gland is mildly enlarged. Seminal vesicles are unremarkable. Other: No intraperitoneal free fluid. Musculoskeletal: Small bilateral groin hernias contain only fat. No worrisome lytic or sclerotic osseous abnormality. Compression deformity at L1 and L4 is similar to CT of 05/14/2019. IMPRESSION: 1.  Status post right middle and lower lobectomy. No evidence for local recurrence or metastatic disease in the chest. 2. Slight decrease in the chronic small right pleural effusion. 3. Left adrenal mass measures slightly larger in long axis today but is not substantially changed since 06/09/2019. 4. No findings to suggest new metastatic disease in the abdomen/pelvis. 5. Colonic diverticulosis without diverticulitis. 6.  Aortic Atherosclerois (ICD10-170.0) Electronically Signed   By: EMisty StanleyM.D.   On: 09/13/2019 13:18    ASSESSMENT AND PLAN: This is a very pleasant 73years old white male with a stage IB non-small cell lung cancer, adenocarcinoma status post right middle and lower bilobectomies with lymph node dissection under the care of Dr. HRoxan Hockeyon January 16, 2018.   The patient is currently on observation and he is feeling fine with no concerning complaints. Recent CT scan of the chest, abdomen and pelvis showed increase in the size of left adrenal mass suspicious for adrenal metastasis.  These findings were confirmed with the PET scan. The patient underwent CT-guided core biopsy of the left adrenal gland lesion and it was consistent with metastatic adenocarcinoma from likely lung primary. The patient underwent SBRT to the left adrenal gland lesion under the care of Dr. KRanda Ngoand he tolerated the  procedure well. He had repeat CT scan of the chest, abdomen pelvis performed recently.  I personally and independently reviewed the scans and discussed the results with the patient today. His scan showed no concerning findings for disease progression. I recommended for the patient to continue on observation with repeat CT scan of the chest in 3 months. He was advised to call immediately if he has any concerning symptoms in the interval.  The patient voices understanding of current disease status and treatment options and is in agreement with the current care plan. All questions were answered. The  patient knows to call the clinic with any problems, questions or concerns. We can certainly see the patient much sooner if necessary.  Disclaimer: This note was dictated with voice recognition software. Similar sounding words can inadvertently be transcribed and may not be corrected upon review.

## 2019-10-04 ENCOUNTER — Telehealth: Payer: Self-pay | Admitting: Medical Oncology

## 2019-10-04 NOTE — Telephone Encounter (Signed)
Returning pts call. No answer.

## 2019-10-05 NOTE — Telephone Encounter (Signed)
Sent schedule message for lab and scan to be done on dec 30.

## 2019-10-14 ENCOUNTER — Other Ambulatory Visit: Payer: PPO

## 2019-10-14 ENCOUNTER — Other Ambulatory Visit: Payer: Self-pay | Admitting: Family Medicine

## 2019-10-14 ENCOUNTER — Ambulatory Visit (INDEPENDENT_AMBULATORY_CARE_PROVIDER_SITE_OTHER): Payer: PPO

## 2019-10-14 DIAGNOSIS — Z0000 Encounter for general adult medical examination without abnormal findings: Secondary | ICD-10-CM

## 2019-10-14 DIAGNOSIS — E118 Type 2 diabetes mellitus with unspecified complications: Secondary | ICD-10-CM

## 2019-10-14 NOTE — Patient Instructions (Signed)
Christian [name] , Thank you for taking time to come for your Medicare Wellness Visit. I appreciate your ongoing commitment to your health goals. Please review the following plan we discussed and let me know if I can assist you in the future.   Screening recommendations/referrals: Colonoscopy: completed 11/07/2009 Recommended yearly ophthalmology/optometry visit for glaucoma screening and checkup Recommended yearly dental visit for hygiene and checkup  Vaccinations: Influenza vaccine: will get at physical  Pneumococcal vaccine: Completed series Tdap vaccine: up to date, completed 05/11/2018 Shingles vaccine: will check with insurance     Advanced directives: Please bring a copy of your POA (Power of [place]) and/or Living Will to your next appointment.   Conditions/risks identified: diabetes, hypertension, hyperlipidemia  Next appointment: 10/28/2019 @ 2:15 pm   Preventive Care 71 Years and Older, Male Preventive care refers to lifestyle choices and visits with your health care provider that can promote health and wellness. What does preventive care include?  A yearly physical exam. This is also called an annual well check.  Dental exams once or twice a year.  Routine eye exams. Ask your health care provider how often you should have your eyes checked.  Personal lifestyle choices, including:  Daily care of your teeth and gums.  Regular physical activity.  Eating a healthy diet.  Avoiding tobacco and drug use.  Limiting alcohol use.  Practicing safe sex.  Taking low doses of aspirin every day.  Taking vitamin and mineral supplements as recommended by your health care provider. What happens during an annual well check? The services and screenings done by your health care provider during your annual well check will depend on your age, overall health, lifestyle risk factors, and family history of disease. Counseling  Your health care provider may ask you questions about your:   Alcohol use.  Tobacco use.  Drug use.  Emotional well-being.  Home and relationship well-being.  Sexual activity.  Eating habits.  History of falls.  Memory and ability to understand (cognition).  Work and work Statistician. Screening  You may have the following tests or measurements:  Height, weight, and BMI.  Blood pressure.  Lipid and cholesterol levels. These may be checked every 5 years, or more frequently if you are over 62 years old.  Skin check.  Lung cancer screening. You may have this screening every year starting at age 5 if you have a 30-pack-year history of smoking and currently smoke or have quit within the past 15 years.  Fecal occult blood test (FOBT) of the stool. You may have this test every year starting at age 20.  Flexible sigmoidoscopy or colonoscopy. You may have a sigmoidoscopy every 5 years or a colonoscopy every 10 years starting at age 53.  Prostate cancer screening. Recommendations will vary depending on your family history and other risks.  Hepatitis C blood test.  Hepatitis B blood test.  Sexually transmitted disease (STD) testing.  Diabetes screening. This is done by checking your blood sugar (glucose) after you have not eaten for a while (fasting). You may have this done every 1-3 years.  Abdominal aortic aneurysm (AAA) screening. You may need this if you are a current or former smoker.  Osteoporosis. You may be screened starting at age 79 if you are at high risk. Talk with your health care provider about your test results, treatment options, and if necessary, the need for more tests. Vaccines  Your health care provider may recommend certain vaccines, such as:  Influenza vaccine. This is recommended every  year.  Tetanus, diphtheria, and acellular pertussis (Tdap, Td) vaccine. You may need a Td booster every 10 years.  Zoster vaccine. You may need this after age 47.  Pneumococcal 13-valent conjugate (PCV13) vaccine. One dose is  recommended after age 32.  Pneumococcal polysaccharide (PPSV23) vaccine. One dose is recommended after age 50. Talk to your health care provider about which screenings and vaccines you need and how often you need them. This information is not intended to replace advice given to you by your health care provider. Make sure you discuss any questions you have with your health care provider. Document Released: 12/22/2015 Document Revised: 08/14/2016 Document Reviewed: 09/26/2015 Elsevier Interactive Patient Education  2017 [place] Prevention in the Home Falls can cause injuries. They can happen to people of all ages. There are many things you can do to make your home safe and to help prevent falls. What can I do on the outside of my home?  Regularly fix the edges of walkways and driveways and fix any cracks.  Remove anything that might make you trip as you walk through a door, such as a raised step or threshold.  Trim any bushes or trees on the path to your home.  Use bright outdoor lighting.  Clear any walking paths of anything that might make someone trip, such as rocks or tools.  Regularly check to see if handrails are loose or broken. Make sure that both sides of any steps have handrails.  Any raised decks and porches should have guardrails on the edges.  Have any leaves, snow, or ice cleared regularly.  Use sand or salt on walking paths during winter.  Clean up any spills in your garage right away. This includes oil or grease spills. What can I do in the bathroom?  Use night lights.  Install grab bars by the toilet and in the tub and shower. Do not use towel bars as grab bars.  Use non-skid mats or decals in the tub or shower.  If you need to sit down in the shower, use a plastic, non-slip stool.  Keep the floor dry. Clean up any water that spills on the floor as soon as it happens.  Remove soap buildup in the tub or shower regularly.  Attach bath mats  securely with double-sided non-slip rug tape.  Do not have throw rugs and other things on the floor that can make you trip. What can I do in the bedroom?  Use night lights.  Make sure that you have a light by your bed that is easy to reach.  Do not use any sheets or blankets that are too big for your bed. They should not hang down onto the floor.  Have a firm chair that has side arms. You can use this for support while you get dressed.  Do not have throw rugs and other things on the floor that can make you trip. What can I do in the kitchen?  Clean up any spills right away.  Avoid walking on wet floors.  Keep items that you use a lot in easy-to-reach places.  If you need to reach something above you, use a strong step stool that has a grab bar.  Keep electrical cords out of the way.  Do not use floor polish or wax that makes floors slippery. If you must use wax, use non-skid floor wax.  Do not have throw rugs and other things on the floor that can make you trip. What can  I do with my stairs?  Do not leave any items on the stairs.  Make sure that there are handrails on both sides of the stairs and use them. Fix handrails that are broken or loose. Make sure that handrails are as long as the stairways.  Check any carpeting to make sure that it is firmly attached to the stairs. Fix any carpet that is loose or worn.  Avoid having throw rugs at the top or bottom of the stairs. If you do have throw rugs, attach them to the floor with carpet tape.  Make sure that you have a light switch at the top of the stairs and the bottom of the stairs. If you do not have them, ask someone to add them for you. What else can I do to help prevent falls?  Wear shoes that:  Do not have high heels.  Have rubber bottoms.  Are comfortable and fit you well.  Are closed at the toe. Do not wear sandals.  If you use a stepladder:  Make sure that it is fully opened. Do not climb a closed  stepladder.  Make sure that both sides of the stepladder are locked into place.  Ask someone to hold it for you, if possible.  Clearly mark and make sure that you can see:  Any grab bars or handrails.  First and last steps.  Where the edge of each step is.  Use tools that help you move around (mobility aids) if they are needed. These include:  Canes.  Walkers.  Scooters.  Crutches.  Turn on the lights when you go into a dark area. Replace any light bulbs as soon as they burn out.  Set up your furniture so you have a clear path. Avoid moving your furniture around.  If any of your floors are uneven, fix them.  If there are any pets around you, be aware of where they are.  Review your medicines with your doctor. Some medicines can make you feel dizzy. This can increase your chance of falling. Ask your doctor what other things that you can do to help prevent falls. This information is not intended to replace advice given to you by your health care provider. Make sure you discuss any questions you have with your health care provider. Document Released: 09/21/2009 Document Revised: 05/02/2016 Document Reviewed: 12/30/2014 Elsevier Interactive Patient Education  2017 [employer].

## 2019-10-14 NOTE — Progress Notes (Signed)
Subjective:   Christian [name] is a 73 y.o. male who presents for Medicare Annual/Subsequent preventive examination.  Review of Systems: N/A   This visit is being conducted through telemedicine via telephone at the nurse health advisor's home address due to the COVID-19 pandemic. This patient has given me verbal consent via doximity to conduct this visit, patient states they are participating from their home address. Patient and myself are on the telephone call. There is no referral for this visit. Some vital signs may be absent or patient reported.    Patient identification: identified by name, DOB, and current address   Cardiac Risk Factors include: advanced age (>34men, >70 women);diabetes mellitus;hypertension;male gender;dyslipidemia;sedentary lifestyle     Objective:    Vitals: There were no vitals taken for this visit.  There is no height or weight on file to calculate BMI.  Advanced Directives 10/14/2019 06/21/2019 06/09/2019 08/28/2018 02/12/2018 01/16/2018 01/14/2018  Does Patient Have a Medical Advance Directive? Yes Yes Yes No No No No  Type of Paramedic of [place];Living will Healthcare Power of [place]  Does patient want to make changes to medical advance directive? - - - - - - -  Copy of [place] in Chart? No - copy requested No - copy requested - - - - -  Would patient like information on creating a medical advance directive? - - - No - Patient declined - No - Patient declined No - Patient declined  Pre-existing out of facility DNR order (yellow form or pink MOST form) - - - - - - -    Tobacco Social History   Tobacco Use  Smoking Status Former Smoker  . Packs/day: 1.00  . Years: 51.00  . Pack years: 51.00  . Types: Cigarettes, E-cigarettes  . Quit date: 01/15/2018  . Years since quitting: 1.7  Smokeless Tobacco Never Used  Tobacco Comment   pt "considering" stopping     Counseling  given: Not Answered Comment: pt "considering" stopping   Clinical Intake:  Pre-visit preparation completed: Yes  Pain : No/denies pain     Nutritional Risks: None Diabetes: Yes CBG done?: No Did pt. bring in CBG monitor from home?: No  How often do you need to have someone help you when you read instructions, pamphlets, or other written materials from your doctor or pharmacy?: 1 - Never What is the last grade level you completed in school?: college  Interpreter Needed?: No  Information entered by :: CJohnson, LPN  Past Medical History:  Diagnosis Date  . Anxiety   . Arthritis   . Cancer ([place])    skin  . Cancer ([place])    right lung  . Coronary artery disease   . Diabetes mellitus type 2 with complications ([place])    not on medication was "taken off of list"; pt states that he was pre diabetic  . Dyspnea    walks 100 yards then rest  . GERD (gastroesophageal reflux disease)    ocassional no meds; pt states that its no longer an issue (01/14/18)  . History of shingles    2004  . Hyperlipidemia   . Hypertension   . OSA on CPAP    pt states that he stopped wearing his CPAP  . Pneumonia 10/2017   Past Surgical History:  Procedure Laterality Date  . CARDIAC CATHETERIZATION     2010 @ [facility] (per pt)  . COLONOSCOPY W/ POLYPECTOMY    .  EYE SURGERY Bilateral    cataracts  . OPEN REDUCTION INTERNAL FIXATION (ORIF) DISTAL RADIAL FRACTURE Right 11/26/2013   Procedure: OPEN REDUCTION INTERNAL FIXATION (ORIF) DISTAL RADIAL FRACTURE;  Surgeon: Newt Minion, MD;  Location: [facility];  Service: Orthopedics;  Laterality: Right;  Open Reduction Internal Fixation Right Distal Radius   . VIDEO ASSISTED THORACOSCOPY (VATS)/ LOBECTOMY Right 01/16/2018   Procedure: VIDEO ASSISTED THORACOSCOPY (VATS)/RIGHT MIDDLE AND LOWER LOBE LUNG LOBECTOMY WITH NODE BIOPSIES x6;  Surgeon: Melrose Nakayama, MD;  Location: [place];  Service: Thoracic;  Laterality: Right;  Marland Kitchen VIDEO BRONCHOSCOPY WITH  ENDOBRONCHIAL NAVIGATION Right 12/15/2017   Procedure: VIDEO BRONCHOSCOPY WITH ENDOBRONCHIAL NAVIGATION;  Surgeon: Melrose Nakayama, MD;  Location: [place];  Service: Thoracic;  Laterality: Right;  Marland Kitchen VIDEO BRONCHOSCOPY WITH ENDOBRONCHIAL ULTRASOUND N/A 12/15/2017   Procedure: VIDEO BRONCHOSCOPY WITH ENDOBRONCHIAL ULTRASOUND;  Surgeon: Melrose Nakayama, MD;  Location: [facility] OR;  Service: Thoracic;  Laterality: N/A;   Family History  Problem Relation Age of Onset  . Heart disease Mother   . Heart disease Father   . Colon cancer Father        possible colon or prostate cancer, dx'd in his 70s, patient wasn't sure of source.   . Prostate cancer Father        possible colon or prostate cancer, dx'd in his 26s, patient wasn't sure of source.   Marland Kitchen Heart disease Brother   . Diabetes Brother   . Kidney disease Brother   . Heart disease Sister   . Cancer Sister    Social History   Socioeconomic History  . Marital status: Widowed    Spouse name: Not on file  . Number of children: Not on file  . Years of education: Not on file  . Highest education level: Not on file  Occupational History  . Not on file  Social Needs  . Financial resource strain: Not hard at all  . Food insecurity    Worry: Never true    Inability: Never true  . Transportation needs    Medical: No    Non-medical: No  Tobacco Use  . Smoking status: Former Smoker    Packs/day: 1.00    Years: 51.00    Pack years: 51.00    Types: Cigarettes, E-cigarettes    Quit date: 01/15/2018    Years since quitting: 1.7  . Smokeless tobacco: Never Used  . Tobacco comment: pt "considering" stopping  Substance and Sexual Activity  . Alcohol use: Yes    Alcohol/week: 7.0 standard drinks    Types: 7 Glasses of wine per week    Comment: 1 glass of wine per night-   . Drug use: No  . Sexual activity: Not on file  Lifestyle  . Physical activity    Days per week: 0 days    Minutes per session: 0 min  . Stress: To some extent   Relationships  . Social Herbalist on phone: Not on file    Gets together: Not on file    Attends religious service: Not on file    Active member of club or organization: Not on file    Attends meetings of clubs or organizations: Not on file    Relationship status: Not on file  Other Topics Concern  . 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 albuterol (PROVENTIL HFA;VENTOLIN HFA) 108 (90 BASE) MCG/ACT inhaler Inhale 2 puffs into the lungs every 6 (six) hours as needed for wheezing or shortness of breath. (Patient taking differently: Inhale 1 puff into the lungs every 6 (six) hours as needed for wheezing or shortness of breath. )  . aspirin 81 MG EC tablet Take 162 mg by mouth daily.   . baclofen (LIORESAL) 20 MG tablet TAKE 1 TABLET (20 MG TOTAL) BY MOUTH 2 (TWO) TIMES DAILY AS NEEDED FOR MUSCLE SPASMS.  . clonazePAM (KLONOPIN) 0.5 MG tablet TAKE 1 TABLET (0.5 MG TOTAL) BY MOUTH AT BEDTIME AS NEEDED.  Marland Kitchen diphenhydrAMINE (BENADRYL) 25 mg capsule Take 25 mg by mouth at bedtime.   Marland Kitchen ketoconazole (NIZORAL) 2 % cream Apply 1 application topically 2 (two) times daily as needed for irritation.  Marland Kitchen lisinopril (ZESTRIL) 10 MG tablet TAKE 1 TABLET BY MOUTH EVERY DAY  . Melatonin 10 MG TABS Take 10 mg by mouth at bedtime.   . metoprolol succinate (TOPROL-XL) 50 MG 24 hr tablet TAKE 1 TABLET (50 MG TOTAL) BY MOUTH DAILY. TAKE WITH OR IMMEDIATELY FOLLOWING A MEAL.  . nitroGLYCERIN (NITROSTAT) 0.4 MG SL tablet Place 1 tablet (0.4 mg total) under the tongue every 5 (five) minutes as needed for chest pain.  . simvastatin (ZOCOR) 40 MG tablet TAKE 1 TABLET BY MOUTH EVERYDAY AT BEDTIME  . vitamin E 100 UNIT capsule Take 100 Units by mouth daily.   No  facility-administered encounter medications on file as of 10/14/2019.     Activities of Daily Living In your present state of health, do you have any difficulty performing the following activities: 10/14/2019  Hearing? N  Vision? N  Difficulty concentrating or making decisions? N  Walking or climbing stairs? N  Dressing or bathing? N  Doing errands, shopping? N  Preparing Food and eating ? N  Using the Toilet? N  In the past six months, have you accidently leaked urine? N  Do you have problems with loss of bowel control? N  Managing your Medications? N  Managing your Finances? N  Housekeeping or managing your Housekeeping? N  Some recent data might be hidden    Patient Care Team: [name], MD as PCP - General (Family Medicine) Sherryl Barters, MD as Referring Physician (Cardiology) Valrie Hart, RN as Registered Nurse   Assessment:   This is a routine wellness examination for Spiros.  Exercise Activities and Dietary recommendations Current Exercise Habits: The patient does not participate in regular exercise at present, Exercise limited by: None identified  Goals    . Increase water intake     Starting 08/28/2018, I will continue to take medications as prescribed.     . Patient Stated     10/14/2019, I will try to lose some weight in the future.        Fall Risk Fall Risk  10/14/2019 08/28/2018 07/30/2017 07/11/2016 07/28/2014  Falls in the past year? 0 No No No Yes  Number falls in past yr: 0 - - - 1  Injury with Fall? 0 - - - Yes  Risk for fall due to : Medication side effect - - - -  Follow up Falls evaluation completed;Falls prevention discussed - - - -   Is the patient's home free of loose throw rugs in walkways, pet beds,  electrical cords, etc?   yes      Grab bars in the bathroom? no      Handrails on the stairs?   yes      Adequate lighting?   yes  Timed Get Up and Go Performed: N/A  Depression Screen PHQ 2/9 Scores 10/14/2019 08/28/2018 07/30/2017  07/11/2016  PHQ - 2 Score 0 0 0 0  PHQ- 9 Score 0 0 1 -    Cognitive Function MMSE - Mini Mental State Exam 10/14/2019 08/28/2018 07/30/2017  Orientation to time 5 5 5   Orientation to Place 5 5 5   Registration 3 3 3   Attention/ Calculation 5 0 0  Recall 3 3 2   Recall-comments - - pt was unable to recall 1 of 3 words  Language- name 2 objects - 0 0  Language- repeat 1 1 1   Language- follow 3 step command - 3 2  Language- follow 3 step command-comments - - pt was unable to follow 1 step of 3 step command  Language- read & follow direction - 0 0  Write a sentence - 0 0  Copy design - 0 0  Total score - 20 18  Mini Cog  Mini-Cog screen was completed. Maximum score is 22. A value of 0 denotes this part of the MMSE was not completed or the patient failed this part of the Mini-Cog screening.       Immunization History  Administered Date(s) Administered  . DTaP 12/14/2009  . Influenza Split 12/14/2009, 11/17/2014  . Influenza,inj,Quad PF,6+ Mos 08/28/2018  . Influenza-Unspecified 09/25/2016  . Pneumococcal Conjugate-13 07/30/2017  . Pneumococcal Polysaccharide-23 06/02/2013, 08/28/2018  . Td 05/11/2018    Qualifies for Shingles Vaccine? Yes  Screening Tests Health Maintenance  Topic Date Due  . HEMOGLOBIN A1C  02/26/2019  . INFLUENZA VACCINE  07/10/2019  . FOOT EXAM  08/29/2019  . OPHTHALMOLOGY EXAM  12/09/2019 (Originally 08/25/1956)  . COLONOSCOPY  11/08/2019  . TETANUS/TDAP  05/11/2028  . Hepatitis C Screening  Completed  . PNA vac Low Risk Adult  Completed   Cancer Screenings: Lung: Low Dose CT Chest recommended if Age 25-80 years, 30 pack-year currently smoking OR have quit w/in 15years. Patient does not qualify. Colorectal: completed 11/07/2009  Additional Screenings:  Hepatitis C Screening: 07/30/2017      Plan:    Patient will try to lose some weight.   I have personally reviewed and noted the following in the patient's chart:   . Medical and social history  . Use of alcohol, tobacco or illicit drugs  . Current medications and supplements . Functional ability and status . Nutritional status . Physical activity . Advanced directives . List of other physicians . Hospitalizations, surgeries, and ER visits in previous 12 months . Vitals . Screenings to include cognitive, depression, and falls . Referrals and appointments  In addition, I have reviewed and discussed with patient certain preventive protocols, quality metrics, and best practice recommendations. A written personalized care plan for preventive services as well as general preventive health recommendations were provided to patient.     Andrez Grime, LPN  [id number]

## 2019-10-14 NOTE — Progress Notes (Signed)
PCP notes:  Health Maintenance:  Wants flu vaccine Declined eye exam  Abnormal Screenings:  none  Patient concerns:  none  Nurse concerns: none   Next PCP appt.: 10/28/2019 @ 2:15 pm

## 2019-10-15 ENCOUNTER — Other Ambulatory Visit: Payer: Self-pay

## 2019-10-15 ENCOUNTER — Other Ambulatory Visit (INDEPENDENT_AMBULATORY_CARE_PROVIDER_SITE_OTHER): Payer: PPO

## 2019-10-15 ENCOUNTER — Ambulatory Visit (INDEPENDENT_AMBULATORY_CARE_PROVIDER_SITE_OTHER): Payer: PPO

## 2019-10-15 DIAGNOSIS — Z23 Encounter for immunization: Secondary | ICD-10-CM | POA: Diagnosis not present

## 2019-10-15 DIAGNOSIS — E118 Type 2 diabetes mellitus with unspecified complications: Secondary | ICD-10-CM

## 2019-10-15 LAB — HEMOGLOBIN A1C: Hgb A1c MFr Bld: 8.1 % — ABNORMAL HIGH (ref 4.6–6.5)

## 2019-10-15 LAB — LIPID PANEL
Cholesterol: 115 mg/dL (ref 0–200)
HDL: 34.9 mg/dL — ABNORMAL LOW (ref >39.00)
LDL Cholesterol: 66 mg/dL (ref 0–99)
NonHDL: 79.93
Total CHOL/HDL Ratio: 3
Triglycerides: 70 mg/dL (ref 0.0–149.0)
VLDL: 14 mg/dL (ref 0.0–40.0)

## 2019-10-21 ENCOUNTER — Other Ambulatory Visit: Payer: PPO

## 2019-10-21 ENCOUNTER — Ambulatory Visit: Payer: PPO

## 2019-10-21 ENCOUNTER — Ambulatory Visit: Payer: PPO | Admitting: Family Medicine

## 2019-10-25 DIAGNOSIS — G4733 Obstructive sleep apnea (adult) (pediatric): Secondary | ICD-10-CM | POA: Diagnosis not present

## 2019-10-28 ENCOUNTER — Other Ambulatory Visit: Payer: Self-pay

## 2019-10-28 ENCOUNTER — Ambulatory Visit (INDEPENDENT_AMBULATORY_CARE_PROVIDER_SITE_OTHER): Payer: PPO | Admitting: Family Medicine

## 2019-10-28 ENCOUNTER — Encounter: Payer: Self-pay | Admitting: Family Medicine

## 2019-10-28 VITALS — BP 140/70 | HR 70 | Temp 97.6°F | Ht 71.0 in | Wt 278.2 lb

## 2019-10-28 DIAGNOSIS — Z9989 Dependence on other enabling machines and devices: Secondary | ICD-10-CM | POA: Diagnosis not present

## 2019-10-28 DIAGNOSIS — I10 Essential (primary) hypertension: Secondary | ICD-10-CM | POA: Diagnosis not present

## 2019-10-28 DIAGNOSIS — G4733 Obstructive sleep apnea (adult) (pediatric): Secondary | ICD-10-CM

## 2019-10-28 DIAGNOSIS — C3491 Malignant neoplasm of unspecified part of right bronchus or lung: Secondary | ICD-10-CM

## 2019-10-28 DIAGNOSIS — Z0000 Encounter for general adult medical examination without abnormal findings: Secondary | ICD-10-CM

## 2019-10-28 DIAGNOSIS — E118 Type 2 diabetes mellitus with unspecified complications: Secondary | ICD-10-CM

## 2019-10-28 DIAGNOSIS — E785 Hyperlipidemia, unspecified: Secondary | ICD-10-CM

## 2019-10-28 DIAGNOSIS — Z7189 Other specified counseling: Secondary | ICD-10-CM

## 2019-10-28 DIAGNOSIS — G4700 Insomnia, unspecified: Secondary | ICD-10-CM | POA: Diagnosis not present

## 2019-10-28 MED ORDER — CLONAZEPAM 0.5 MG PO TABS: 0.5000 mg | ORAL_TABLET | Freq: Every evening | ORAL | 1 refills | Status: DC | PRN

## 2019-10-28 MED ORDER — BACLOFEN 20 MG PO TABS: 20.0000 mg | ORAL_TABLET | Freq: Two times a day (BID) | ORAL | 1 refills | Status: DC | PRN

## 2019-10-28 MED ORDER — METOPROLOL SUCCINATE ER 50 MG PO TB24: 50.0000 mg | ORAL_TABLET | Freq: Every day | ORAL | 1 refills | Status: DC

## 2019-10-28 NOTE — Progress Notes (Signed)
This visit occurred during the SARS-CoV-2 public health emergency.  Safety protocols were in place, including screening questions prior to the visit, additional usage of staff PPE, and extensive cleaning of exam room while observing appropriate contact time as indicated for disinfecting solutions.    He recalled wellness call.  D/w pt.    Diabetes:  " I did not know I was diabetic." He has been diabetic for years.  We have talked about it previously.  I again told him he was diabetic. No meds.  Hypoglycemic episodes: no Hyperglycemic episodes: no Feet problems: dec sensation B feet, not a new issue for patient per his report.  Blood Sugars averaging: not checked often.   eye exam within last year: due, d/w pt.    Hypertension:    Using medication without problems or lightheadedness: yes Chest pain with exertion:no Edema:no Short of breath: as expected from prev lung surgery.   Elevated Cholesterol: Using medications without problems:yes Muscle aches: no Diet compliance: encouraged.  Exercise:encouraged.  "I eat what I want."    Flu 2020 Tetanus 2019 PNA up today.  Shingles d/w pt.   Colonoscopy 2010, d/w pt about f/u after COVID.   Prostate cancer screening and PSA options (with potential risks and benefits of testing vs not testing) were discussed along with recent recs/guidelines.  He declined testing PSA at this point. Advance directive d/w pt. Would have his son, daughter and brother equally designated if patient were incapacitated.  Compliant with CPAP.  Sleeping better with use.  Used nightly.  BZD use d/w pt.  Used prn, not every night, with relief.  No ADE on med.    Muscle spasms improved on baclofen.    Lung cancer.  Prev CT with  IMPRESSION: 1. Status post right middle and lower lobectomy. No evidence for local recurrence or metastatic disease in the chest. 2. Slight decrease in the chronic small right pleural effusion. 3. Left adrenal mass measures  slightly larger in long axis today but is not substantially changed since 06/09/2019. 4. No findings to suggest new metastatic disease in the abdomen/pelvis. 5. Colonic diverticulosis without diverticulitis. 6.  Aortic Atherosclerois (ICD10-170.0)  PMH and SH reviewed.   Vital signs, Meds and allergies reviewed.  ROS: Per HPI unless specifically indicated in ROS section   GEN: nad, alert and oriented HEENT: ncat NECK: supple w/o LA CV: rrr PULM: ctab, no inc wob ABD: soft, +bs EXT: no edema SKIN: no acute rash He was wearing gloves at the time of the exam.  He did not mention any reason for wearing surgical gloves other than the pandemic.  I thought he was wearing these because he was out in public.  It turns out he had a cut on his right thumb from 1 day prior.  He had cut his hand at home.  When he was in the parking lot he took the gloves off and it pulled the Band-Aid off and he began to bleed in the parking lot.  We brought him back into the clinic.  We took a look at the laceration.  It was about a centimeter long and superficial.  It did not appear infected.  It did not involve the nailbed or joint.  It was small enough not to need suturing (especially given the timeline) and he had adequate hemostasis with a Band-Aid and Neosporin.  There is no need for any other bandaging at this point.  The wound was clean.  Diabetic foot exam: Normal inspection No skin  breakdown No calluses  Normal DP pulses Dec sensation to light tough and monofilament Nails normal

## 2019-10-28 NOTE — Patient Instructions (Addendum)
Check with your insurance to see if they will cover the shingrix shot. Don't change your meds for now but cut back on sweets.  I would recheck A1c in 3 months at a nonfasting lab visit.  Take care.  Glad to see you.  Thanks for getting a flu shot.

## 2019-10-31 NOTE — Assessment & Plan Note (Signed)
Advance directive d/w pt. Would have his son, daughter and brother equally designated if patient were incapacitated.

## 2019-10-31 NOTE — Assessment & Plan Note (Signed)
Flu 2020 Tetanus 2019 PNA up today.  Shingles d/w pt.   Colonoscopy 2010, d/w pt about f/u after COVID.   Prostate cancer screening and PSA options (with potential risks and benefits of testing vs not testing) were discussed along with recent recs/guidelines.  He declined testing PSA at this point. Advance directive d/w pt. Would have his son, daughter and brother equally designated if patient were incapacitated.

## 2019-10-31 NOTE — Assessment & Plan Note (Signed)
No change in meds at this point.  Labs discussed with patient.

## 2019-10-31 NOTE — Assessment & Plan Note (Signed)
Compliant with CPAP.  Sleeping better with use.  Used nightly.  Continue as is.

## 2019-10-31 NOTE — Assessment & Plan Note (Signed)
BZD use d/w pt.  Used prn, not every night, with relief.  No ADE on med.

## 2019-10-31 NOTE — Assessment & Plan Note (Addendum)
"   I did not know I was diabetic." He has been diabetic for years.  We have talked about it previously.  I again told him he was diabetic. I advised him not to change his meds for now but cut back on sweets.  I would recheck A1c in 3 months at a nonfasting lab visit.  He did want to do and he said he would opt potentially for 6 months.  I advised him not to wait 6 months. Discussed foot care and pathophysiology.  >25 minutes spent in face to face time with patient, >50% spent in counselling or coordination of care

## 2019-10-31 NOTE — Assessment & Plan Note (Signed)
I will defer to oncology.  Discussed with patient.

## 2019-11-18 ENCOUNTER — Telehealth: Payer: Self-pay | Admitting: Internal Medicine

## 2019-11-18 NOTE — Telephone Encounter (Signed)
Scheduled appt per 12/10 sch message - pt aware of appt date and time   

## 2019-11-29 ENCOUNTER — Encounter: Payer: Self-pay | Admitting: *Deleted

## 2019-12-06 ENCOUNTER — Telehealth: Payer: Self-pay | Admitting: Medical Oncology

## 2019-12-06 NOTE — Telephone Encounter (Signed)
Pt instructed to pick up contrast at front desk. He said he will pick it up tomorrow. Contrast left up front.

## 2019-12-08 ENCOUNTER — Inpatient Hospital Stay: Payer: PPO | Attending: Internal Medicine

## 2019-12-08 ENCOUNTER — Other Ambulatory Visit: Payer: Self-pay

## 2019-12-08 ENCOUNTER — Ambulatory Visit (HOSPITAL_COMMUNITY)
Admission: RE | Admit: 2019-12-08 | Discharge: 2019-12-08 | Disposition: A | Discharge status: Home / Self Care | Payer: PPO | Source: Ambulatory Visit | Attending: Internal Medicine | Admitting: Internal Medicine

## 2019-12-08 DIAGNOSIS — C342 Malignant neoplasm of middle lobe, bronchus or lung: Secondary | ICD-10-CM | POA: Insufficient documentation

## 2019-12-08 DIAGNOSIS — C3431 Malignant neoplasm of lower lobe, right bronchus or lung: Secondary | ICD-10-CM | POA: Insufficient documentation

## 2019-12-08 DIAGNOSIS — C3490 Malignant neoplasm of unspecified part of unspecified bronchus or lung: Secondary | ICD-10-CM

## 2019-12-08 DIAGNOSIS — C7972 Secondary malignant neoplasm of left adrenal gland: Secondary | ICD-10-CM | POA: Insufficient documentation

## 2019-12-08 LAB — CBC WITH DIFFERENTIAL (CANCER CENTER ONLY)
Abs Immature Granulocytes: 0.03 10*3/uL (ref 0.00–0.07)
Basophils Absolute: 0.1 10*3/uL (ref 0.0–0.1)
Basophils Relative: 1 %
Eosinophils Absolute: 0.5 10*3/uL (ref 0.0–0.5)
Eosinophils Relative: 7 %
HCT: 39.6 % (ref 39.0–52.0)
Hemoglobin: 13.2 g/dL (ref 13.0–17.0)
Immature Granulocytes: 0 %
Lymphocytes Relative: 24 %
Lymphs Abs: 1.7 10*3/uL (ref 0.7–4.0)
MCH: 34.8 pg — ABNORMAL HIGH (ref 26.0–34.0)
MCHC: 33.3 g/dL (ref 30.0–36.0)
MCV: 104.5 fL — ABNORMAL HIGH (ref 80.0–100.0)
Monocytes Absolute: 0.8 10*3/uL (ref 0.1–1.0)
Monocytes Relative: 11 %
Neutro Abs: 3.9 10*3/uL (ref 1.7–7.7)
Neutrophils Relative %: 57 %
Platelet Count: 152 10*3/uL (ref 150–400)
RBC: 3.79 MIL/uL — ABNORMAL LOW (ref 4.22–5.81)
RDW: 13.6 % (ref 11.5–15.5)
WBC Count: 7 10*3/uL (ref 4.0–10.5)
nRBC: 0.4 % — ABNORMAL HIGH (ref 0.0–0.2)

## 2019-12-08 LAB — CMP (CANCER CENTER ONLY)
ALT: 26 U/L (ref 0–44)
AST: 19 U/L (ref 15–41)
Albumin: 4.1 g/dL (ref 3.5–5.0)
Alkaline Phosphatase: 68 U/L (ref 38–126)
Anion gap: 10 (ref 5–15)
BUN: 19 mg/dL (ref 8–23)
CO2: 26 mmol/L (ref 22–32)
Calcium: 9.2 mg/dL (ref 8.9–10.3)
Chloride: 105 mmol/L (ref 98–111)
Creatinine: 1.41 mg/dL — ABNORMAL HIGH (ref 0.61–1.24)
GFR, Est AFR Am: 58 mL/min — ABNORMAL LOW (ref >60)
GFR, Estimated: 50 mL/min — ABNORMAL LOW (ref >60)
Glucose, Bld: 224 mg/dL — ABNORMAL HIGH (ref 70–99)
Potassium: 4.9 mmol/L (ref 3.5–5.1)
Sodium: 141 mmol/L (ref 135–145)
Total Bilirubin: 0.4 mg/dL (ref 0.3–1.2)
Total Protein: 7.2 g/dL (ref 6.5–8.1)

## 2019-12-08 MED ORDER — SODIUM CHLORIDE (PF) 0.9 % IJ SOLN
INTRAMUSCULAR | Status: AC
  Filled 2019-12-08: qty 50

## 2019-12-08 MED ORDER — IOHEXOL 300 MG/ML  SOLN
100.0000 mL | Freq: Once | INTRAMUSCULAR | Status: AC | PRN
  Administered 2019-12-08: 100 mL via INTRAVENOUS

## 2019-12-14 ENCOUNTER — Other Ambulatory Visit: Payer: PPO

## 2019-12-16 ENCOUNTER — Inpatient Hospital Stay: Payer: PPO | Attending: Internal Medicine | Admitting: Internal Medicine

## 2019-12-16 ENCOUNTER — Encounter: Payer: Self-pay | Admitting: Internal Medicine

## 2019-12-16 ENCOUNTER — Other Ambulatory Visit: Payer: Self-pay

## 2019-12-16 VITALS — BP 134/54 | HR 86 | Temp 97.6°F | Resp 19 | Ht 71.0 in | Wt 280.1 lb

## 2019-12-16 DIAGNOSIS — C3432 Malignant neoplasm of lower lobe, left bronchus or lung: Secondary | ICD-10-CM | POA: Diagnosis not present

## 2019-12-16 DIAGNOSIS — C342 Malignant neoplasm of middle lobe, bronchus or lung: Secondary | ICD-10-CM | POA: Diagnosis not present

## 2019-12-16 DIAGNOSIS — R0609 Other forms of dyspnea: Secondary | ICD-10-CM | POA: Insufficient documentation

## 2019-12-16 DIAGNOSIS — C801 Malignant (primary) neoplasm, unspecified: Secondary | ICD-10-CM | POA: Diagnosis not present

## 2019-12-16 DIAGNOSIS — C7972 Secondary malignant neoplasm of left adrenal gland: Secondary | ICD-10-CM | POA: Insufficient documentation

## 2019-12-16 DIAGNOSIS — I10 Essential (primary) hypertension: Secondary | ICD-10-CM | POA: Diagnosis not present

## 2019-12-16 DIAGNOSIS — C3490 Malignant neoplasm of unspecified part of unspecified bronchus or lung: Secondary | ICD-10-CM | POA: Diagnosis not present

## 2019-12-16 NOTE — Progress Notes (Signed)
[facility] [phone]   [phone]  OFFICE PROGRESS NOTE  [name], MD [place] [address]  DIAGNOSIS: Metastatic non-small cell lung cancer, adenocarcinoma initially diagnosed as stage IB (T2a, N0, M0) non-small cell lung cancer, adenocarcinoma presented with right lower lobe lung mass.  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  Biomarker Findings Tumor Mutational Burden - 11 Muts/Mb Microsatellite status - MS-Stable Genomic Findings For a complete list of the genes assayed, please refer to the Appendix. AKT3 amplification STK11 P253f*48 MDM4 amplification - equivocal? IKBKE amplification - equivocal? PARK2 W770f8 PARP1 amplification - equivocal? PIK3C2B amplification - equivocal? SF3B1 K700E 8 Disease relevant genes with no reportable alterations: ALK, BRAF, EGFR, ERBB2, KRAS, MET, RET, ROS1  PDL 1 expression 0%.  PRIOR THERAPY: 1) Status post right middle and lower bilobectomies with lymph node dissection under the care of Dr. HeRoxan Hockeyn January 16, 2018. 2) status post SBRT to the metastatic lesion in the left adrenal gland in July 2020 under the care of Dr. KiSondra Come CURRENT THERAPY: Observation.  INTERVAL HISTORY: Christian HALLS89.o. male returns to the clinic today for follow-up visit.  The patient is feeling fine today with no concerning complaints except for the baseline shortness of breath increased with exertion.  He denied having any current chest pain, cough or hemoptysis.  He denied having any fever or chills.  He has no nausea, vomiting, diarrhea or constipation.  He tolerated the SBRT to the left adrenal gland fairly well.  The patient has no recent weight loss or night sweats.  He had repeat CT scan of the chest, abdomen pelvis performed recently and he is here for evaluation and discussion of his discuss results.  MEDICAL HISTORY: Past Medical  History:  Diagnosis Date  . Anxiety   . Arthritis   . Cancer ([facility]   skin  . Cancer ([facility]   right lung  . Coronary artery disease   . Diabetes mellitus type 2 with complications ([facility]   not on medication was "taken off of list"; pt states that he was pre diabetic  . Dyspnea    walks 100 yards then rest  . GERD (gastroesophageal reflux disease)    ocassional no meds; pt states that its no longer an issue (01/14/18)  . History of shingles    2004  . Hyperlipidemia   . Hypertension   . OSA on CPAP   . Pneumonia 10/2017    ALLERGIES:  is allergic to celebrex [celecoxib].  MEDICATIONS:  Current Outpatient Medications  Medication Sig Dispense Refill  . albuterol (PROVENTIL HFA;VENTOLIN HFA) 108 (90 BASE) MCG/ACT inhaler Inhale 2 puffs into the lungs every 6 (six) hours as needed for wheezing or shortness of breath. (Patient taking differently: Inhale 1 puff into the lungs every 6 (six) hours as needed for wheezing or shortness of breath. ) 1 Inhaler 3  . aspirin 81 MG EC tablet Take 162 mg by mouth daily.     . baclofen (LIORESAL) 20 MG tablet Take 1 tablet (20 mg total) by mouth 2 (two) times daily as needed for muscle spasms. 180 tablet 1  . clonazePAM (KLONOPIN) 0.5 MG tablet Take 1 tablet (0.5 mg total) by mouth at bedtime as needed. For insomnia 30 tablet 1  . diphenhydrAMINE (BENADRYL) 25 mg capsule Take 25 mg by mouth at bedtime.     . Marland Kitchenetoconazole (  NIZORAL) 2 % cream Apply 1 application topically 2 (two) times daily as needed for irritation.    Marland Kitchen lisinopril (ZESTRIL) 10 MG tablet TAKE 1 TABLET BY MOUTH EVERY DAY 90 tablet 1  . Melatonin 10 MG TABS Take 10 mg by mouth at bedtime.     . metoprolol succinate (TOPROL-XL) 50 MG 24 hr tablet Take 1 tablet (50 mg total) by mouth daily. Take with or immediately following a meal. 90 tablet 1  . nitroGLYCERIN (NITROSTAT) 0.4 MG SL tablet Place 1 tablet (0.4 mg total) under the tongue every 5 (five) minutes as needed for chest pain. 25  tablet 1  . simvastatin (ZOCOR) 40 MG tablet TAKE 1 TABLET BY MOUTH EVERYDAY AT BEDTIME 90 tablet 1  . vitamin E 100 UNIT capsule Take 100 Units by mouth daily.     No current facility-administered medications for this visit.    SURGICAL HISTORY:  Past Surgical History:  Procedure Laterality Date  . CARDIAC CATHETERIZATION     2010 @ [facility] (per pt)  . COLONOSCOPY W/ POLYPECTOMY    . EYE SURGERY Bilateral    cataracts  . OPEN REDUCTION INTERNAL FIXATION (ORIF) DISTAL RADIAL FRACTURE Right 11/26/2013   Procedure: OPEN REDUCTION INTERNAL FIXATION (ORIF) DISTAL RADIAL FRACTURE;  Surgeon: Newt Minion, MD;  Location: [facility];  Service: Orthopedics;  Laterality: Right;  Open Reduction Internal Fixation Right Distal Radius   . VIDEO ASSISTED THORACOSCOPY (VATS)/ LOBECTOMY Right 01/16/2018   Procedure: VIDEO ASSISTED THORACOSCOPY (VATS)/RIGHT MIDDLE AND LOWER LOBE LUNG LOBECTOMY WITH NODE BIOPSIES x6;  Surgeon: Melrose Nakayama, MD;  Location: [facility];  Service: Thoracic;  Laterality: Right;  Marland Kitchen VIDEO BRONCHOSCOPY WITH ENDOBRONCHIAL NAVIGATION Right 12/15/2017   Procedure: VIDEO BRONCHOSCOPY WITH ENDOBRONCHIAL NAVIGATION;  Surgeon: Melrose Nakayama, MD;  Location: [facility];  Service: Thoracic;  Laterality: Right;  Marland Kitchen VIDEO BRONCHOSCOPY WITH ENDOBRONCHIAL ULTRASOUND N/A 12/15/2017   Procedure: VIDEO BRONCHOSCOPY WITH ENDOBRONCHIAL ULTRASOUND;  Surgeon: Melrose Nakayama, MD;  Location: [facility];  Service: Thoracic;  Laterality: N/A;    REVIEW OF SYSTEMS:  A comprehensive review of systems was negative except for: Constitutional: positive for fatigue Respiratory: positive for dyspnea on exertion   PHYSICAL EXAMINATION: General appearance: alert, cooperative, fatigued and no distress Head: Normocephalic, without obvious abnormality, atraumatic Neck: no adenopathy, no JVD, supple, symmetrical, trachea midline and thyroid not enlarged, symmetric, no tenderness/mass/nodules Lymph nodes:  Cervical, supraclavicular, and axillary nodes normal. Resp: clear to auscultation bilaterally Back: symmetric, no curvature. ROM normal. No CVA tenderness. Cardio: regular rate and rhythm, S1, S2 normal, no murmur, click, rub or gallop GI: soft, non-tender; bowel sounds normal; no masses,  no organomegaly Extremities: extremities normal, atraumatic, no cyanosis or edema  ECOG PERFORMANCE STATUS: 1 - Symptomatic but completely ambulatory  Blood pressure (!) 134/54, pulse 86, temperature 97.6 F (36.4 C), temperature source Temporal, resp. rate 19, height '5\' 11"'$  (1.803 m), weight 280 lb 1.6 oz (127.1 kg), SpO2 97 %.  LABORATORY DATA: Lab Results  Component Value Date   WBC 7.0 12/08/2019   HGB 13.2 12/08/2019   HCT 39.6 12/08/2019   MCV 104.5 (H) 12/08/2019   PLT 152 12/08/2019      Chemistry      Component Value Date/Time   NA 141 12/08/2019 0936   K 4.9 12/08/2019 0936   CL 105 12/08/2019 0936   CO2 26 12/08/2019 0936   BUN 19 12/08/2019 0936   CREATININE 1.41 (H) 12/08/2019 0936   CREATININE  0.99 07/11/2016 1448      Component Value Date/Time   CALCIUM 9.2 12/08/2019 0936   ALKPHOS 68 12/08/2019 0936   AST 19 12/08/2019 0936   ALT 26 12/08/2019 0936   BILITOT 0.4 12/08/2019 0936       RADIOGRAPHIC STUDIES: CT Chest W Contrast  Result Date: 12/08/2019 CLINICAL DATA:  Non-small cell lung cancer restaging EXAM: CT CHEST, ABDOMEN, AND PELVIS WITH CONTRAST TECHNIQUE: Multidetector CT imaging of the chest, abdomen and pelvis was performed following the standard protocol during bolus administration of intravenous contrast. CONTRAST:  15m OMNIPAQUE IOHEXOL 300 MG/ML SOLN, additional oral enteric contrast COMPARISON:  09/13/2019, PET-CT, 05/24/2019, CT chest abdomen pelvis, 02/12/2019 FINDINGS: CT CHEST FINDINGS Cardiovascular: Aortic atherosclerosis. Normal heart size. Three-vessel coronary artery calcifications. No pericardial effusion. Mediastinum/Nodes: No enlarged  mediastinal, hilar, or axillary lymph nodes. Thyroid gland, trachea, and esophagus demonstrate no significant findings. Lungs/Pleura: Status post right middle and lower lobectomy. Unchanged trace right pleural effusion and pleural thickening. There is a 4 mm right upper lobe pulmonary nodule, which is has slightly enlarged compared to prior examination dated 09/13/2019, was not clearly visualized on free breathing PET-CT dated 05/24/2019, and is new when compared to prior examination dated 02/12/2019. An additional 3 mm nodule in the adjacent right upper lobe is stable and benign (series 6, image 87). Musculoskeletal: No chest wall mass or suspicious bone lesions identified. CT ABDOMEN PELVIS FINDINGS Hepatobiliary: No solid liver abnormality is seen. No gallstones, gallbladder wall thickening, or biliary dilatation. Pancreas: Unremarkable. No pancreatic ductal dilatation or surrounding inflammatory changes. Spleen: Normal in size without significant abnormality. Adrenals/Urinary Tract: Significant interval decrease in size of a left adrenal mass, previously hypermetabolic and consistent with metastatic disease, now measuring 4.3 x 2.0 cm, previously 5.7 x 2.7 cm (series 2, image 65). Kidneys are normal, without renal calculi, solid lesion, or hydronephrosis. Bladder is unremarkable. Stomach/Bowel: Stomach is within normal limits. Appendix appears normal. No evidence of bowel wall thickening, distention, or inflammatory changes. Vascular/Lymphatic: Aortic atherosclerosis. Unchanged prominent left celiac axis lymph node measuring 1.6 x 0.8 cm (series 7, image 17). Reproductive: Prostatomegaly. Other: No abdominal wall hernia or abnormality. No abdominopelvic ascites. Musculoskeletal: No acute or significant osseous findings. Unchanged superior endplate deformity of L4. IMPRESSION: 1. Significant interval decrease in size of left adrenal mass, previously hypermetabolic, consistent with treatment response. 2. There is  a 4 mm right upper lobe pulmonary nodule, which is slightly enlarged compared to prior examination dated 09/13/2019, and is new when compared to prior examination dated 02/12/2019. This is suspicious for metastasis. Attention on follow-up. 3. Stable prominent left celiac axis lymph node. Attention on follow-up. 4. Redemonstrated postoperative findings of right lower and middle lobectomy. 5. Coronary artery disease.  Aortic Atherosclerosis (ICD10-I70.0). Electronically Signed   By: AEddie CandleM.D.   On: 12/08/2019 12:15   CT Abdomen Pelvis W Contrast  Result Date: 12/08/2019 CLINICAL DATA:  Non-small cell lung cancer restaging EXAM: CT CHEST, ABDOMEN, AND PELVIS WITH CONTRAST TECHNIQUE: Multidetector CT imaging of the chest, abdomen and pelvis was performed following the standard protocol during bolus administration of intravenous contrast. CONTRAST:  1010mOMNIPAQUE IOHEXOL 300 MG/ML SOLN, additional oral enteric contrast COMPARISON:  09/13/2019, PET-CT, 05/24/2019, CT chest abdomen pelvis, 02/12/2019 FINDINGS: CT CHEST FINDINGS Cardiovascular: Aortic atherosclerosis. Normal heart size. Three-vessel coronary artery calcifications. No pericardial effusion. Mediastinum/Nodes: No enlarged mediastinal, hilar, or axillary lymph nodes. Thyroid gland, trachea, and esophagus demonstrate no significant findings. Lungs/Pleura: Status post right middle and lower lobectomy. Unchanged  trace right pleural effusion and pleural thickening. There is a 4 mm right upper lobe pulmonary nodule, which is has slightly enlarged compared to prior examination dated 09/13/2019, was not clearly visualized on free breathing PET-CT dated 05/24/2019, and is new when compared to prior examination dated 02/12/2019. An additional 3 mm nodule in the adjacent right upper lobe is stable and benign (series 6, image 87). Musculoskeletal: No chest wall mass or suspicious bone lesions identified. CT ABDOMEN PELVIS FINDINGS Hepatobiliary: No solid  liver abnormality is seen. No gallstones, gallbladder wall thickening, or biliary dilatation. Pancreas: Unremarkable. No pancreatic ductal dilatation or surrounding inflammatory changes. Spleen: Normal in size without significant abnormality. Adrenals/Urinary Tract: Significant interval decrease in size of a left adrenal mass, previously hypermetabolic and consistent with metastatic disease, now measuring 4.3 x 2.0 cm, previously 5.7 x 2.7 cm (series 2, image 65). Kidneys are normal, without renal calculi, solid lesion, or hydronephrosis. Bladder is unremarkable. Stomach/Bowel: Stomach is within normal limits. Appendix appears normal. No evidence of bowel wall thickening, distention, or inflammatory changes. Vascular/Lymphatic: Aortic atherosclerosis. Unchanged prominent left celiac axis lymph node measuring 1.6 x 0.8 cm (series 7, image 17). Reproductive: Prostatomegaly. Other: No abdominal wall hernia or abnormality. No abdominopelvic ascites. Musculoskeletal: No acute or significant osseous findings. Unchanged superior endplate deformity of L4. IMPRESSION: 1. Significant interval decrease in size of left adrenal mass, previously hypermetabolic, consistent with treatment response. 2. There is a 4 mm right upper lobe pulmonary nodule, which is slightly enlarged compared to prior examination dated 09/13/2019, and is new when compared to prior examination dated 02/12/2019. This is suspicious for metastasis. Attention on follow-up. 3. Stable prominent left celiac axis lymph node. Attention on follow-up. 4. Redemonstrated postoperative findings of right lower and middle lobectomy. 5. Coronary artery disease.  Aortic Atherosclerosis (ICD10-I70.0). Electronically Signed   By: Eddie Candle M.D.   On: 12/08/2019 12:15    ASSESSMENT AND PLAN: This is a very pleasant 74 years old white male with a stage IB non-small cell lung cancer, adenocarcinoma status post right middle and lower bilobectomies with lymph node  dissection under the care of Dr. Roxan Hockey on January 16, 2018.   The patient is currently on observation and he is feeling fine with no concerning complaints. Recent CT scan of the chest, abdomen and pelvis showed increase in the size of left adrenal mass suspicious for adrenal metastasis.  These findings were confirmed with the PET scan. The patient underwent CT-guided core biopsy of the left adrenal gland lesion and it was consistent with metastatic adenocarcinoma from likely lung primary. The patient underwent SBRT to the left adrenal gland lesion under the care of Dr. Sondra Come and he tolerated the procedure well. The patient is currently on observation and he is feeling fine today with no concerning complaints. He had repeat CT scan of the chest, abdomen pelvis performed recently.  His a scan showed no concerning findings for disease progression. I recommended for the patient to continue on observation with repeat CT scan of the chest, abdomen and pelvis in 6 months. The patient was advised to call immediately if he has any concerning symptoms in the interval. The patient voices understanding of current disease status and treatment options and is in agreement with the current care plan. All questions were answered. The patient knows to call the clinic with any problems, questions or concerns. We can certainly see the patient much sooner if necessary.  Disclaimer: This note was dictated with voice recognition software. Similar sounding words  can inadvertently be transcribed and may not be corrected upon review.

## 2020-02-29 ENCOUNTER — Other Ambulatory Visit: Payer: Self-pay | Admitting: Family Medicine

## 2020-04-28 ENCOUNTER — Telehealth: Payer: Self-pay | Admitting: Family Medicine

## 2020-04-28 NOTE — Telephone Encounter (Signed)
Electronic refill request. Baclofen Last office visit:   10/28/2019 Last Filled:    180 tablet 1 10/28/2019  Please advise.

## 2020-04-30 NOTE — Telephone Encounter (Signed)
Sent. Thanks.  Needs DM2 f/u scheduled.

## 2020-05-01 ENCOUNTER — Telehealth: Payer: Self-pay | Admitting: Family Medicine

## 2020-05-01 NOTE — Telephone Encounter (Signed)
Called patient and left voicemail for patient to call office and schedule DM 2 f/u.

## 2020-05-01 NOTE — Telephone Encounter (Signed)
Please call patient and scheduled appointment as instructed.

## 2020-06-07 ENCOUNTER — Telehealth: Payer: Self-pay | Admitting: Internal Medicine

## 2020-06-07 NOTE — Telephone Encounter (Signed)
Scheduled appt per 6/30 sch message - pt is aware of appt d/t

## 2020-06-13 ENCOUNTER — Encounter (HOSPITAL_COMMUNITY): Payer: Self-pay

## 2020-06-13 ENCOUNTER — Inpatient Hospital Stay: Payer: PPO | Attending: Internal Medicine

## 2020-06-13 ENCOUNTER — Other Ambulatory Visit: Payer: Self-pay

## 2020-06-13 ENCOUNTER — Other Ambulatory Visit: Payer: PPO

## 2020-06-13 ENCOUNTER — Ambulatory Visit (HOSPITAL_COMMUNITY)
Admission: RE | Admit: 2020-06-13 | Discharge: 2020-06-13 | Disposition: A | Discharge status: Home / Self Care | Payer: PPO | Source: Ambulatory Visit | Attending: Internal Medicine | Admitting: Internal Medicine

## 2020-06-13 DIAGNOSIS — C3490 Malignant neoplasm of unspecified part of unspecified bronchus or lung: Secondary | ICD-10-CM

## 2020-06-13 DIAGNOSIS — C342 Malignant neoplasm of middle lobe, bronchus or lung: Secondary | ICD-10-CM | POA: Diagnosis not present

## 2020-06-13 DIAGNOSIS — R0609 Other forms of dyspnea: Secondary | ICD-10-CM | POA: Diagnosis not present

## 2020-06-13 DIAGNOSIS — C7972 Secondary malignant neoplasm of left adrenal gland: Secondary | ICD-10-CM | POA: Diagnosis not present

## 2020-06-13 DIAGNOSIS — R5383 Other fatigue: Secondary | ICD-10-CM | POA: Insufficient documentation

## 2020-06-13 DIAGNOSIS — C3431 Malignant neoplasm of lower lobe, right bronchus or lung: Secondary | ICD-10-CM | POA: Diagnosis not present

## 2020-06-13 HISTORY — DX: Secondary malignant neoplasm of unspecified adrenal gland: C79.70

## 2020-06-13 LAB — CBC WITH DIFFERENTIAL (CANCER CENTER ONLY)
Abs Immature Granulocytes: 0.04 10*3/uL (ref 0.00–0.07)
Basophils Absolute: 0.1 10*3/uL (ref 0.0–0.1)
Basophils Relative: 1 %
Eosinophils Absolute: 0.2 10*3/uL (ref 0.0–0.5)
Eosinophils Relative: 4 %
HCT: 36.9 % — ABNORMAL LOW (ref 39.0–52.0)
Hemoglobin: 12.2 g/dL — ABNORMAL LOW (ref 13.0–17.0)
Immature Granulocytes: 1 %
Lymphocytes Relative: 25 %
Lymphs Abs: 1.3 10*3/uL (ref 0.7–4.0)
MCH: 35.4 pg — ABNORMAL HIGH (ref 26.0–34.0)
MCHC: 33.1 g/dL (ref 30.0–36.0)
MCV: 107 fL — ABNORMAL HIGH (ref 80.0–100.0)
Monocytes Absolute: 0.7 10*3/uL (ref 0.1–1.0)
Monocytes Relative: 14 %
Neutro Abs: 2.8 10*3/uL (ref 1.7–7.7)
Neutrophils Relative %: 55 %
Platelet Count: 148 10*3/uL — ABNORMAL LOW (ref 150–400)
RBC: 3.45 MIL/uL — ABNORMAL LOW (ref 4.22–5.81)
RDW: 13.8 % (ref 11.5–15.5)
WBC Count: 5 10*3/uL (ref 4.0–10.5)
nRBC: 0.8 % — ABNORMAL HIGH (ref 0.0–0.2)

## 2020-06-13 LAB — CMP (CANCER CENTER ONLY)
ALT: 32 U/L (ref 0–44)
AST: 20 U/L (ref 15–41)
Albumin: 4 g/dL (ref 3.5–5.0)
Alkaline Phosphatase: 58 U/L (ref 38–126)
Anion gap: 7 (ref 5–15)
BUN: 15 mg/dL (ref 8–23)
CO2: 25 mmol/L (ref 22–32)
Calcium: 9.5 mg/dL (ref 8.9–10.3)
Chloride: 106 mmol/L (ref 98–111)
Creatinine: 1.24 mg/dL (ref 0.61–1.24)
GFR, Est AFR Am: >60 mL/min (ref >60)
GFR, Estimated: 58 mL/min — ABNORMAL LOW (ref >60)
Glucose, Bld: 210 mg/dL — ABNORMAL HIGH (ref 70–99)
Potassium: 4.4 mmol/L (ref 3.5–5.1)
Sodium: 138 mmol/L (ref 135–145)
Total Bilirubin: 0.4 mg/dL (ref 0.3–1.2)
Total Protein: 6.8 g/dL (ref 6.5–8.1)

## 2020-06-13 MED ORDER — IOHEXOL 300 MG/ML  SOLN
100.0000 mL | Freq: Once | INTRAMUSCULAR | Status: AC | PRN
  Administered 2020-06-13: 100 mL via INTRAVENOUS

## 2020-06-13 MED ORDER — SODIUM CHLORIDE (PF) 0.9 % IJ SOLN
INTRAMUSCULAR | Status: AC
  Filled 2020-06-13: qty 50

## 2020-06-14 DIAGNOSIS — Z961 Presence of intraocular lens: Secondary | ICD-10-CM | POA: Diagnosis not present

## 2020-06-14 DIAGNOSIS — E119 Type 2 diabetes mellitus without complications: Secondary | ICD-10-CM | POA: Diagnosis not present

## 2020-06-14 LAB — HM DIABETES EYE EXAM

## 2020-06-15 ENCOUNTER — Inpatient Hospital Stay (HOSPITAL_BASED_OUTPATIENT_CLINIC_OR_DEPARTMENT_OTHER): Payer: PPO | Admitting: Internal Medicine

## 2020-06-15 ENCOUNTER — Other Ambulatory Visit: Payer: Self-pay

## 2020-06-15 ENCOUNTER — Encounter: Payer: Self-pay | Admitting: Internal Medicine

## 2020-06-15 VITALS — BP 134/107 | HR 79 | Temp 97.9°F | Resp 19 | Ht 71.0 in | Wt 279.3 lb

## 2020-06-15 DIAGNOSIS — C3490 Malignant neoplasm of unspecified part of unspecified bronchus or lung: Secondary | ICD-10-CM

## 2020-06-15 DIAGNOSIS — C3431 Malignant neoplasm of lower lobe, right bronchus or lung: Secondary | ICD-10-CM | POA: Diagnosis not present

## 2020-06-15 DIAGNOSIS — C3491 Malignant neoplasm of unspecified part of right bronchus or lung: Secondary | ICD-10-CM | POA: Diagnosis not present

## 2020-06-15 NOTE — Progress Notes (Signed)
Christian [name] [phone]   [phone]  OFFICE PROGRESS NOTE  [name], MD [place] [address]  DIAGNOSIS: Metastatic non-small cell lung cancer, adenocarcinoma initially diagnosed as stage IB (T2a, N0, M0) non-small cell lung cancer, adenocarcinoma presented with right lower lobe lung mass.  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  Biomarker Findings Tumor Mutational Burden - 11 Muts/Mb Microsatellite status - MS-Stable Genomic Findings For a complete list of the genes assayed, please refer to the Appendix. AKT3 amplification STK11 P237f*[id number] MDM4 amplification - equivocal? IKBKE amplification - equivocal? PARK2 [id number] PARP1 amplification - equivocal? PIK3C2B amplification - equivocal? SF3B1 K700E 8 Disease relevant genes with no reportable alterations: ALK, BRAF, EGFR, ERBB2, KRAS, MET, RET, ROS1  PDL 1 expression 0%.  PRIOR THERAPY: 1) Status post right middle and lower bilobectomies with lymph node dissection under the care of Dr. HeRoxan Hockeyn January 16, 2018. 2) status post SBRT to the metastatic lesion in the left adrenal gland in July 2020 under the care of Dr. KiSondra Come CURRENT THERAPY: Observation.  INTERVAL HISTORY: Christian [age]LLI89.o. male returns to the clinic today for follow-up visit.  The patient is feeling fine today with no concerning complaints except for the baseline fatigue and shortness of breath with exertion.  He denied having any chest pain, cough or hemoptysis.  He denied having any fever or chills.  He has no nausea, vomiting, diarrhea or constipation.  He has no recent weight loss or night sweats.  The patient is here today for evaluation with repeat CT scan of the chest, abdomen and pelvis for restaging of his disease.  MEDICAL HISTORY: Past Medical History:  Diagnosis Date  . Anxiety   . Arthritis   . [place] ([facility]   skin  . Coronary  artery disease   . Diabetes mellitus type 2 with complications ([facility]   not on medication was "taken off of list"; pt states that he was pre diabetic  . Dyspnea    walks 100 yards then rest  . GERD (gastroesophageal reflux disease)    ocassional no meds; pt states that its no longer an issue (01/14/18)  . History of shingles    2004  . Hyperlipidemia   . Hypertension   . lung ca dx'd 01/2018   right lung  . Metastasis to adrenal gland ([facility] 2020  . OSA on CPAP   . Pneumonia 10/2017    ALLERGIES:  is allergic to celebrex [celecoxib].  MEDICATIONS:  Current Outpatient Medications  Medication Sig Dispense Refill  . albuterol (PROVENTIL HFA;VENTOLIN HFA) 108 (90 BASE) MCG/ACT inhaler Inhale 2 puffs into the lungs every 6 (six) hours as needed for wheezing or shortness of breath. (Patient taking differently: Inhale 1 puff into the lungs every 6 (six) hours as needed for wheezing or shortness of breath. ) 1 Inhaler 3  . aspirin 81 MG EC tablet Take 162 mg by mouth daily.     . baclofen (LIORESAL) 20 MG tablet TAKE 1 TABLET (20 MG TOTAL) BY MOUTH 2 (TWO) TIMES DAILY AS NEEDED FOR MUSCLE SPASMS. 180 tablet 1  . clonazePAM (KLONOPIN) 0.5 MG tablet Take 1 tablet (0.5 mg total) by mouth at bedtime as needed. For insomnia 30 tablet 1  . diphenhydrAMINE (BENADRYL) 25 mg capsule Take 25 mg by mouth at bedtime.     . Marland Kitchenetoconazole (NIZORAL) 2 % cream Apply  1 application topically 2 (two) times daily as needed for irritation.    Marland Kitchen lisinopril (ZESTRIL) 10 MG tablet TAKE 1 TABLET BY MOUTH EVERY DAY 90 tablet 1  . Melatonin 10 MG TABS Take 10 mg by mouth at bedtime.     . metoprolol succinate (TOPROL-XL) 50 MG 24 hr tablet TAKE 1 TABLET (50 MG TOTAL) BY MOUTH DAILY. TAKE WITH OR IMMEDIATELY FOLLOWING A MEAL. 90 tablet 1  . nitroGLYCERIN (NITROSTAT) 0.4 MG SL tablet Place 1 tablet (0.4 mg total) under the tongue every 5 (five) minutes as needed for chest pain. 25 tablet 1  . simvastatin (ZOCOR) 40 MG  tablet TAKE 1 TABLET BY MOUTH EVERYDAY AT BEDTIME 90 tablet 1  . vitamin E 100 UNIT capsule Take 100 Units by mouth daily.     No current facility-administered medications for this visit.    SURGICAL HISTORY:  Past Surgical History:  Procedure Laterality Date  . CARDIAC CATHETERIZATION     2010 @ [facility] (per pt)  . COLONOSCOPY W/ POLYPECTOMY    . EYE SURGERY Bilateral    cataracts  . OPEN REDUCTION INTERNAL FIXATION (ORIF) DISTAL RADIAL FRACTURE Right 11/26/2013   Procedure: OPEN REDUCTION INTERNAL FIXATION (ORIF) DISTAL RADIAL FRACTURE;  Surgeon: Newt Minion, MD;  Location: [facility];  Service: Orthopedics;  Laterality: Right;  Open Reduction Internal Fixation Right Distal Radius   . VIDEO ASSISTED THORACOSCOPY (VATS)/ LOBECTOMY Right 01/16/2018   Procedure: VIDEO ASSISTED THORACOSCOPY (VATS)/RIGHT MIDDLE AND LOWER LOBE LUNG LOBECTOMY WITH NODE BIOPSIES x6;  Surgeon: Melrose Nakayama, MD;  Location: [facility];  Service: Thoracic;  Laterality: Right;  Marland Kitchen VIDEO BRONCHOSCOPY WITH ENDOBRONCHIAL NAVIGATION Right 12/15/2017   Procedure: VIDEO BRONCHOSCOPY WITH ENDOBRONCHIAL NAVIGATION;  Surgeon: Melrose Nakayama, MD;  Location: [facility];  Service: Thoracic;  Laterality: Right;  Marland Kitchen VIDEO BRONCHOSCOPY WITH ENDOBRONCHIAL ULTRASOUND N/A 12/15/2017   Procedure: VIDEO BRONCHOSCOPY WITH ENDOBRONCHIAL ULTRASOUND;  Surgeon: Melrose Nakayama, MD;  Location: [facility];  Service: Thoracic;  Laterality: N/A;    REVIEW OF SYSTEMS:  A comprehensive review of systems was negative except for: Constitutional: positive for fatigue Respiratory: positive for dyspnea on exertion   PHYSICAL EXAMINATION: General appearance: alert, cooperative, fatigued and no distress Head: Normocephalic, without obvious abnormality, atraumatic Neck: no adenopathy, no JVD, supple, symmetrical, trachea midline and thyroid not enlarged, symmetric, no tenderness/mass/nodules Lymph nodes: Cervical, supraclavicular, and axillary  nodes normal. Resp: clear to auscultation bilaterally Back: symmetric, no curvature. ROM normal. No CVA tenderness. Cardio: regular rate and rhythm, S1, S2 normal, no murmur, click, rub or gallop GI: soft, non-tender; bowel sounds normal; no masses,  no organomegaly Extremities: extremities normal, atraumatic, no cyanosis or edema  ECOG PERFORMANCE STATUS: 1 - Symptomatic but completely ambulatory  Blood pressure (!) 134/107, pulse 79, temperature 97.9 F (36.6 C), temperature source Temporal, resp. rate 19, height 5' 11"  (1.803 m), weight 279 lb 4.8 oz (126.7 kg), SpO2 98 %.  LABORATORY DATA: Lab Results  Component Value Date   WBC 5.0 06/13/2020   HGB 12.2 (L) 06/13/2020   HCT 36.9 (L) 06/13/2020   MCV 107.0 (H) 06/13/2020   PLT 148 (L) 06/13/2020      Chemistry      Component Value Date/Time   NA 138 06/13/2020 0751   K 4.4 06/13/2020 0751   CL 106 06/13/2020 0751   CO2 25 06/13/2020 0751   BUN 15 06/13/2020 0751   CREATININE 1.24 06/13/2020 0751   CREATININE 0.99 07/11/2016 1448  Component Value Date/Time   CALCIUM 9.5 06/13/2020 0751   ALKPHOS 58 06/13/2020 0751   AST 20 06/13/2020 0751   ALT 32 06/13/2020 0751   BILITOT 0.4 06/13/2020 0751       RADIOGRAPHIC STUDIES: CT Chest W Contrast  Result Date: 06/13/2020 CLINICAL DATA:  Primary Cancer Type: Lung Imaging Indication: Routine surveillance Interval therapy since last imaging? No Initial Cancer Diagnosis Date: 12/15/2017; Established by: Biopsy-proven Detailed Pathology: Stage IB non-small cell lung cancer, adenocarcinoma. Primary Tumor location: Right lower lobe. Metastasis to left adrenal gland 05/2019. Surgeries: Right lower and middle bilobectomy 01/16/2018. Chemotherapy: No Immunotherapy? No Radiation therapy? Yes; Date Range: 06/30/2019-07/09/2019; Target: Left adrenal gland EXAM: CT CHEST ABDOMEN AND PELVIS WITH CONTRAST TECHNIQUE: Multidetector CT imaging of the chest was performed during intravenous  contrast administration. CONTRAST:  162m OMNIPAQUE IOHEXOL 300 MG/ML  SOLN COMPARISON:  Most recent CT chest, abdomen and pelvis 12/08/2019. 05/24/2019 PET-CT. FINDINGS: CT CHEST FINDINGS Cardiovascular: Aortic atherosclerosis. Normal heart size. Three-vessel coronary artery calcifications. No pericardial effusion. Mediastinum/Nodes: No enlarged mediastinal, hilar, or axillary lymph nodes. Thyroid gland, trachea, and esophagus demonstrate no significant findings. Lungs/Pleura: Redemonstrated postoperative findings of right lower and middle lobectomy. Trace, loculated right pleural effusion. Interval enlargement of a right upper lobe pulmonary nodule measuring 6 mm, previously 4 mm (series 6, image 77). Adjacent nodule in the right upper lobe is stable and benign, measuring 2-3 mm (series 6, image 76) Musculoskeletal: No chest wall mass or suspicious bone lesions identified. CT ABDOMEN PELVIS FINDINGS Hepatobiliary: No solid liver abnormality is seen. No gallstones, gallbladder wall thickening, or biliary dilatation. Pancreas: Unremarkable. No pancreatic ductal dilatation or surrounding inflammatory changes. Spleen: Normal in size without significant abnormality. Adrenals/Urinary Tract: Continued interval decrease in size of a left adrenal nodule, now essentially imperceptible against normal adrenal tissue, measuring approximately 2.3 x 1.2 cm previously 4.3 x 2.0 cm (series 2, image 58). Simple exophytic cyst of the midportion of the left kidney. Kidneys are otherwise normal, without renal calculi, solid lesion, or hydronephrosis. Bladder is unremarkable. Stomach/Bowel: Stomach is within normal limits. Appendix appears normal. No evidence of bowel wall thickening, distention, or inflammatory changes. Vascular/Lymphatic: Aortic atherosclerosis. Unchanged prominent left celiac axis lymph node measuring 1.4 x 0.9 cm (series 2, image 62). No other enlarged lymph nodes in the abdomen or pelvis. Reproductive: No mass or  other abnormality. Other: No abdominal wall hernia or abnormality. No abdominopelvic ascites. Musculoskeletal: No acute or significant osseous findings. Unchanged superior endplate deformity of L4. IMPRESSION: 1. Redemonstrated postoperative findings of right lower and middle lobectomy. Unchanged trace, loculated right pleural effusion. 2. Continued interval enlargement of a right upper lobe pulmonary nodule, now measuring 6 mm, concerning for metastatic disease or metachronous primary lung malignancy. 3. Continued interval decrease in size of a left adrenal nodule, now essentially imperceptible against normal adrenal tissue, consistent with treatment response of metastasis. 4. Unchanged prominent left celiac axis lymph node measuring 1.4 x 0.9 cm. No other enlarged lymph nodes in the abdomen or pelvis. Attention on follow-up. 5. Coronary artery disease.  Aortic Atherosclerosis (ICD10-I70.0). Electronically Signed   By: AEddie CandleM.D.   On: 06/13/2020 09:50   CT Abdomen Pelvis W Contrast  Result Date: 06/13/2020 CLINICAL DATA:  Primary Cancer Type: Lung Imaging Indication: Routine surveillance Interval therapy since last imaging? No Initial Cancer Diagnosis Date: 12/15/2017; Established by: Biopsy-proven Detailed Pathology: Stage IB non-small cell lung cancer, adenocarcinoma. Primary Tumor location: Right lower lobe. Metastasis to left adrenal gland 05/2019. Surgeries: Right  lower and middle bilobectomy 01/16/2018. Chemotherapy: No Immunotherapy? No Radiation therapy? Yes; Date Range: 06/30/2019-07/09/2019; Target: Left adrenal gland EXAM: CT CHEST ABDOMEN AND PELVIS WITH CONTRAST TECHNIQUE: Multidetector CT imaging of the chest was performed during intravenous contrast administration. CONTRAST:  11m OMNIPAQUE IOHEXOL 300 MG/ML  SOLN COMPARISON:  Most recent CT chest, abdomen and pelvis 12/08/2019. 05/24/2019 PET-CT. FINDINGS: CT CHEST FINDINGS Cardiovascular: Aortic atherosclerosis. Normal heart size.  Three-vessel coronary artery calcifications. No pericardial effusion. Mediastinum/Nodes: No enlarged mediastinal, hilar, or axillary lymph nodes. Thyroid gland, trachea, and esophagus demonstrate no significant findings. Lungs/Pleura: Redemonstrated postoperative findings of right lower and middle lobectomy. Trace, loculated right pleural effusion. Interval enlargement of a right upper lobe pulmonary nodule measuring 6 mm, previously 4 mm (series 6, image 77). Adjacent nodule in the right upper lobe is stable and benign, measuring 2-3 mm (series 6, image 76) Musculoskeletal: No chest wall mass or suspicious bone lesions identified. CT ABDOMEN PELVIS FINDINGS Hepatobiliary: No solid liver abnormality is seen. No gallstones, gallbladder wall thickening, or biliary dilatation. Pancreas: Unremarkable. No pancreatic ductal dilatation or surrounding inflammatory changes. Spleen: Normal in size without significant abnormality. Adrenals/Urinary Tract: Continued interval decrease in size of a left adrenal nodule, now essentially imperceptible against normal adrenal tissue, measuring approximately 2.3 x 1.2 cm previously 4.3 x 2.0 cm (series 2, image 58). Simple exophytic cyst of the midportion of the left kidney. Kidneys are otherwise normal, without renal calculi, solid lesion, or hydronephrosis. Bladder is unremarkable. Stomach/Bowel: Stomach is within normal limits. Appendix appears normal. No evidence of bowel wall thickening, distention, or inflammatory changes. Vascular/Lymphatic: Aortic atherosclerosis. Unchanged prominent left celiac axis lymph node measuring 1.4 x 0.9 cm (series 2, image 62). No other enlarged lymph nodes in the abdomen or pelvis. Reproductive: No mass or other abnormality. Other: No abdominal wall hernia or abnormality. No abdominopelvic ascites. Musculoskeletal: No acute or significant osseous findings. Unchanged superior endplate deformity of L4. IMPRESSION: 1. Redemonstrated postoperative  findings of right lower and middle lobectomy. Unchanged trace, loculated right pleural effusion. 2. Continued interval enlargement of a right upper lobe pulmonary nodule, now measuring 6 mm, concerning for metastatic disease or metachronous primary lung malignancy. 3. Continued interval decrease in size of a left adrenal nodule, now essentially imperceptible against normal adrenal tissue, consistent with treatment response of metastasis. 4. Unchanged prominent left celiac axis lymph node measuring 1.4 x 0.9 cm. No other enlarged lymph nodes in the abdomen or pelvis. Attention on follow-up. 5. Coronary artery disease.  Aortic Atherosclerosis (ICD10-I70.0). Electronically Signed   By: AEddie CandleM.D.   On: 06/13/2020 09:50    ASSESSMENT AND PLAN: This is a very pleasant 74years old white male with a stage IB non-small cell lung cancer, adenocarcinoma status post right middle and lower bilobectomies with lymph node dissection under the care of Dr. HRoxan Hockeyon January 16, 2018.   The patient is currently on observation and he is feeling fine with no concerning complaints. Recent CT scan of the chest, abdomen and pelvis showed increase in the size of left adrenal mass suspicious for adrenal metastasis.  These findings were confirmed with the PET scan. The patient underwent CT-guided core biopsy of the left adrenal gland lesion and it was consistent with metastatic adenocarcinoma from likely lung primary. The patient underwent SBRT to the left adrenal gland lesion under the care of Dr. KSondra Comeand he tolerated the procedure well. He is currently on observation and he is feeling fine with no concerning complaints. He had repeat  CT scan of the chest, abdomen pelvis performed recently.  I personally and independently reviewed the scan images and discussed the results with the patient today. He has a scan showed no concerning findings for disease progression except for slight increase in the right upper lobe  pulmonary nodule which is currently measures 0.6 cm.  He continues to have improvement of the left adrenal nodule after the palliative radiotherapy. I recommended for the patient to continue on observation with repeat CT scan of the chest, abdomen and pelvis in 6 months for further evaluation of the right upper lobe pulmonary nodule and to rule out any worsening of his disease. He was advised to call immediately if he has any other concerning symptoms in the interval. The patient voices understanding of current disease status and treatment options and is in agreement with the current care plan. All questions were answered. The patient knows to call the clinic with any problems, questions or concerns. We can certainly see the patient much sooner if necessary.  Disclaimer: This note was dictated with voice recognition software. Similar sounding words can inadvertently be transcribed and may not be corrected upon review.

## 2020-06-22 ENCOUNTER — Encounter: Payer: Self-pay | Admitting: Family Medicine

## 2020-07-05 NOTE — Telephone Encounter (Signed)
Called patient and left voicemail for patient to call and get set up for cpe and labs in November.

## 2020-07-24 NOTE — Telephone Encounter (Signed)
Called patient and got him scheduled for CPE and labs in November.

## 2020-08-02 ENCOUNTER — Other Ambulatory Visit: Payer: Self-pay | Admitting: Family Medicine

## 2020-08-02 DIAGNOSIS — G4700 Insomnia, unspecified: Secondary | ICD-10-CM

## 2020-08-03 NOTE — Telephone Encounter (Signed)
Electronic refill request. Clonazepam Last office visit:   10/28/2019 Last Filled:    30 tablet 1 10/28/2019

## 2020-08-04 ENCOUNTER — Encounter: Payer: Self-pay | Admitting: Family Medicine

## 2020-08-04 NOTE — Telephone Encounter (Signed)
Sent.  Please schedule follow-up diabetes visit when possible.  Thanks.

## 2020-08-04 NOTE — Telephone Encounter (Signed)
Left detailed message on voicemail. Letter mailed.

## 2020-10-08 ENCOUNTER — Other Ambulatory Visit: Payer: Self-pay | Admitting: Family Medicine

## 2020-10-08 DIAGNOSIS — E118 Type 2 diabetes mellitus with unspecified complications: Secondary | ICD-10-CM

## 2020-10-11 DIAGNOSIS — G4733 Obstructive sleep apnea (adult) (pediatric): Secondary | ICD-10-CM | POA: Diagnosis not present

## 2020-10-18 ENCOUNTER — Other Ambulatory Visit: Payer: Self-pay

## 2020-10-18 ENCOUNTER — Other Ambulatory Visit (INDEPENDENT_AMBULATORY_CARE_PROVIDER_SITE_OTHER): Payer: PPO

## 2020-10-18 DIAGNOSIS — E118 Type 2 diabetes mellitus with unspecified complications: Secondary | ICD-10-CM

## 2020-10-18 LAB — COMPREHENSIVE METABOLIC PANEL
ALT: 20 U/L (ref 0–53)
AST: 17 U/L (ref 0–37)
Albumin: 4.3 g/dL (ref 3.5–5.2)
Alkaline Phosphatase: 64 U/L (ref 39–117)
BUN: 15 mg/dL (ref 6–23)
CO2: 29 mEq/L (ref 19–32)
Calcium: 9.1 mg/dL (ref 8.4–10.5)
Chloride: 103 mEq/L (ref 96–112)
Creatinine, Ser: 1.31 mg/dL (ref 0.40–1.50)
GFR: 54.52 mL/min — ABNORMAL LOW (ref >60.00)
Glucose, Bld: 142 mg/dL — ABNORMAL HIGH (ref 70–99)
Potassium: 4.4 mEq/L (ref 3.5–5.1)
Sodium: 138 mEq/L (ref 135–145)
Total Bilirubin: 0.6 mg/dL (ref 0.2–1.2)
Total Protein: 6.6 g/dL (ref 6.0–8.3)

## 2020-10-18 LAB — CBC WITH DIFFERENTIAL/PLATELET
Basophils Absolute: 0.1 10*3/uL (ref 0.0–0.1)
Basophils Relative: 1.1 % (ref 0.0–3.0)
Eosinophils Absolute: 0.6 10*3/uL (ref 0.0–0.7)
Eosinophils Relative: 6.7 % — ABNORMAL HIGH (ref 0.0–5.0)
HCT: 36.6 % — ABNORMAL LOW (ref 39.0–52.0)
Hemoglobin: 12.4 g/dL — ABNORMAL LOW (ref 13.0–17.0)
Lymphocytes Relative: 24.7 % (ref 12.0–46.0)
Lymphs Abs: 2 10*3/uL (ref 0.7–4.0)
MCHC: 33.9 g/dL (ref 30.0–36.0)
MCV: 103.2 fl — ABNORMAL HIGH (ref 78.0–100.0)
Monocytes Absolute: 0.8 10*3/uL (ref 0.1–1.0)
Monocytes Relative: 10 % (ref 3.0–12.0)
Neutro Abs: 4.7 10*3/uL (ref 1.4–7.7)
Neutrophils Relative %: 57.5 % (ref 43.0–77.0)
Platelets: 170 10*3/uL (ref 150.0–400.0)
RBC: 3.55 Mil/uL — ABNORMAL LOW (ref 4.22–5.81)
RDW: 14.3 % (ref 11.5–15.5)
WBC: 8.2 10*3/uL (ref 4.0–10.5)

## 2020-10-18 LAB — LIPID PANEL
Cholesterol: 115 mg/dL (ref 0–200)
HDL: 35.4 mg/dL — ABNORMAL LOW (ref >39.00)
LDL Cholesterol: 59 mg/dL (ref 0–99)
NonHDL: 79.89
Total CHOL/HDL Ratio: 3
Triglycerides: 104 mg/dL (ref 0.0–149.0)
VLDL: 20.8 mg/dL (ref 0.0–40.0)

## 2020-10-18 LAB — HEMOGLOBIN A1C: Hgb A1c MFr Bld: 8.7 % — ABNORMAL HIGH (ref 4.6–6.5)

## 2020-10-23 ENCOUNTER — Other Ambulatory Visit: Payer: PPO

## 2020-10-27 ENCOUNTER — Other Ambulatory Visit: Payer: Self-pay | Admitting: Family Medicine

## 2020-10-27 NOTE — Telephone Encounter (Signed)
Pharmacy requests refill on: Metoprolol SUCC ER 50 mg & Baclofen 20 mg   LAST REFILL: 07/31/2020 LAST OV: 10/28/2019 NEXT OV: 10/30/2020 PHARMACY: [id number] [place], [place]

## 2020-10-29 NOTE — Telephone Encounter (Signed)
Sent. Thanks.   

## 2020-10-30 ENCOUNTER — Encounter: Payer: Self-pay | Admitting: Family Medicine

## 2020-10-30 ENCOUNTER — Ambulatory Visit (INDEPENDENT_AMBULATORY_CARE_PROVIDER_SITE_OTHER): Payer: PPO | Admitting: Family Medicine

## 2020-10-30 ENCOUNTER — Other Ambulatory Visit: Payer: Self-pay

## 2020-10-30 VITALS — BP 140/78 | HR 72 | Temp 98.4°F | Ht 72.0 in | Wt 271.5 lb

## 2020-10-30 DIAGNOSIS — G4700 Insomnia, unspecified: Secondary | ICD-10-CM

## 2020-10-30 DIAGNOSIS — R062 Wheezing: Secondary | ICD-10-CM

## 2020-10-30 DIAGNOSIS — Z7189 Other specified counseling: Secondary | ICD-10-CM

## 2020-10-30 DIAGNOSIS — Z23 Encounter for immunization: Secondary | ICD-10-CM

## 2020-10-30 DIAGNOSIS — C3491 Malignant neoplasm of unspecified part of right bronchus or lung: Secondary | ICD-10-CM

## 2020-10-30 DIAGNOSIS — I10 Essential (primary) hypertension: Secondary | ICD-10-CM

## 2020-10-30 DIAGNOSIS — E785 Hyperlipidemia, unspecified: Secondary | ICD-10-CM

## 2020-10-30 DIAGNOSIS — E118 Type 2 diabetes mellitus with unspecified complications: Secondary | ICD-10-CM | POA: Diagnosis not present

## 2020-10-30 DIAGNOSIS — Z0000 Encounter for general adult medical examination without abnormal findings: Secondary | ICD-10-CM

## 2020-10-30 DIAGNOSIS — R059 Cough, unspecified: Secondary | ICD-10-CM

## 2020-10-30 MED ORDER — LISINOPRIL 10 MG PO TABS: 10.0000 mg | ORAL_TABLET | Freq: Every day | ORAL | 3 refills | Status: DC

## 2020-10-30 MED ORDER — SIMVASTATIN 40 MG PO TABS: ORAL_TABLET | ORAL | 3 refills | Status: DC

## 2020-10-30 MED ORDER — METOPROLOL SUCCINATE ER 50 MG PO TB24: 50.0000 mg | ORAL_TABLET | Freq: Every day | ORAL | 3 refills | Status: DC

## 2020-10-30 MED ORDER — CLONAZEPAM 0.5 MG PO TABS: 0.5000 mg | ORAL_TABLET | Freq: Every evening | ORAL | 2 refills | Status: DC | PRN

## 2020-10-30 MED ORDER — ALBUTEROL SULFATE HFA 108 (90 BASE) MCG/ACT IN AERS: 1.0000 | INHALATION_SPRAY | Freq: Four times a day (QID) | RESPIRATORY_TRACT | 2 refills | Status: DC | PRN

## 2020-10-30 MED ORDER — METFORMIN HCL 500 MG PO TABS: 500.0000 mg | ORAL_TABLET | Freq: Every day | ORAL | 3 refills | Status: DC

## 2020-10-30 NOTE — Patient Instructions (Addendum)
Check with your insurance to see what meter is covered for diabetes and let me know.  Take care.  Glad to see you. Plan on recheck in about 3 months with A1c at the visit.  Add on metformin in the mornings.  If you have GI upset with that, then stop it and let me know.  Flu shot today.

## 2020-10-30 NOTE — Progress Notes (Signed)
This visit occurred during the SARS-CoV-2 public health emergency.  Safety protocols were in place, including screening questions prior to the visit, additional usage of staff PPE, and extensive cleaning of exam room while observing appropriate contact time as indicated for disinfecting solutions.  Diabetes:  No meds.   Hypoglycemic episodes: no  Hyperglycemic episodes: no Feet problems: dec sensation at baseline.  Blood Sugars averaging: not checked.  eye exam within last year: yes Labs d/w pt.    Prn clonazepam used for insomnia, a few times a week, not every day.  Cautions d/w pt.  No ADE on med.    Hypertension:    Using medication without problems or lightheadedness: yes Chest pain with exertion:no Edema:no Short of breath: at baseline with prev lung surgery noted.    Elevated Cholesterol: Using medications without problems: yes Muscle aches: no Diet compliance: encouraged.   Exercise:encouraged  He has f/u with Dr. Julien Nordmann pending in 12/2020.  He'll have repeat scan at that point.  I'll defer, he agrees.  No cough.  No sputum.    covid vaccine d/w pt.  He'll get his booster tomorrow.  D/w pt.  Flu shot today.  Tetanus 2019 PNA up to day Shingles d/w pt.   Colonoscopy 2010, d/w pt about deferral until after onc clinic f/u.  Prostate cancer screening and PSA options (with potential risks and benefits of testing vs not testing) were discussed along with recent recs/guidelines.  He declined testing PSA at this point. Advance directive d/w pt. Would have his son, daughter and brother equally designated if patient were incapacitated.  PMH and SH reviewed  Meds, vitals, and allergies reviewed.   ROS: Per HPI unless specifically indicated in ROS section   GEN: nad, alert and oriented HEENT: ncat NECK: supple w/o LA CV: rrr. PULM: ctab, no inc wob ABD: soft, +bs EXT: no edema SKIN: no acute rash  Diabetic foot exam: Normal inspection No skin breakdown No calluses   Normal DP pulses Dec sensation to light touch and monofilament Nails normal

## 2020-11-05 NOTE — Assessment & Plan Note (Signed)
Controlled.  Continue lisinopril and metoprolol.

## 2020-11-05 NOTE — Assessment & Plan Note (Signed)
Controlled.  Continue simvastatin.

## 2020-11-05 NOTE — Assessment & Plan Note (Signed)
  Advance directive d/w pt. Would have his son, daughter and brother equally designated if patient were incapacitated.

## 2020-11-05 NOTE — Assessment & Plan Note (Signed)
covid vaccine d/w pt.  He'll get his booster tomorrow.  D/w pt.  Flu shot today.  Tetanus 2019 PNA up to day Shingles d/w pt.   Colonoscopy 2010, d/w pt about deferral until after onc clinic f/u.  Prostate cancer screening and PSA options (with potential risks and benefits of testing vs not testing) were discussed along with recent recs/guidelines.  He declined testing PSA at this point. Advance directive d/w pt. Would have his son, daughter and brother equally designated if patient were incapacitated.

## 2020-11-05 NOTE — Assessment & Plan Note (Signed)
Prn clonazepam used for insomnia, a few times a week, not every day.  Cautions d/w pt.  No ADE on med.

## 2020-11-05 NOTE — Assessment & Plan Note (Signed)
A1c elevated.  Discussed options Plan on recheck in about 3 months with A1c at the visit.  Add on metformin in the mornings.  If GI upset with that, then stop it and let me know.  He agrees.

## 2020-11-05 NOTE — Assessment & Plan Note (Signed)
  He has f/u with Dr. Julien Nordmann pending in 12/2020.  He'll have repeat scan at that point.  I'll defer, he agrees.  No cough.  No sputum.

## 2020-12-15 ENCOUNTER — Inpatient Hospital Stay: Payer: PPO | Attending: Internal Medicine

## 2020-12-15 ENCOUNTER — Ambulatory Visit (HOSPITAL_COMMUNITY)
Admission: RE | Admit: 2020-12-15 | Discharge: 2020-12-15 | Disposition: A | Discharge status: Home / Self Care | Payer: PPO | Source: Ambulatory Visit | Attending: Internal Medicine | Admitting: Internal Medicine

## 2020-12-15 ENCOUNTER — Encounter (HOSPITAL_COMMUNITY): Payer: Self-pay

## 2020-12-15 ENCOUNTER — Other Ambulatory Visit: Payer: Self-pay

## 2020-12-15 DIAGNOSIS — C3490 Malignant neoplasm of unspecified part of unspecified bronchus or lung: Secondary | ICD-10-CM | POA: Diagnosis not present

## 2020-12-15 DIAGNOSIS — E279 Disorder of adrenal gland, unspecified: Secondary | ICD-10-CM | POA: Diagnosis not present

## 2020-12-15 DIAGNOSIS — C7972 Secondary malignant neoplasm of left adrenal gland: Secondary | ICD-10-CM | POA: Diagnosis not present

## 2020-12-15 DIAGNOSIS — C3431 Malignant neoplasm of lower lobe, right bronchus or lung: Secondary | ICD-10-CM | POA: Diagnosis not present

## 2020-12-15 DIAGNOSIS — C342 Malignant neoplasm of middle lobe, bronchus or lung: Secondary | ICD-10-CM | POA: Insufficient documentation

## 2020-12-15 LAB — CBC WITH DIFFERENTIAL (CANCER CENTER ONLY)
Abs Immature Granulocytes: 0.03 10*3/uL (ref 0.00–0.07)
Basophils Absolute: 0.1 10*3/uL (ref 0.0–0.1)
Basophils Relative: 1 %
Eosinophils Absolute: 0.4 10*3/uL (ref 0.0–0.5)
Eosinophils Relative: 5 %
HCT: 33.7 % — ABNORMAL LOW (ref 39.0–52.0)
Hemoglobin: 11.4 g/dL — ABNORMAL LOW (ref 13.0–17.0)
Immature Granulocytes: 0 %
Lymphocytes Relative: 28 %
Lymphs Abs: 2 10*3/uL (ref 0.7–4.0)
MCH: 35.4 pg — ABNORMAL HIGH (ref 26.0–34.0)
MCHC: 33.8 g/dL (ref 30.0–36.0)
MCV: 104.7 fL — ABNORMAL HIGH (ref 80.0–100.0)
Monocytes Absolute: 0.9 10*3/uL (ref 0.1–1.0)
Monocytes Relative: 12 %
Neutro Abs: 3.9 10*3/uL (ref 1.7–7.7)
Neutrophils Relative %: 54 %
Platelet Count: 183 10*3/uL (ref 150–400)
RBC: 3.22 MIL/uL — ABNORMAL LOW (ref 4.22–5.81)
RDW: 14.2 % (ref 11.5–15.5)
WBC Count: 7.3 10*3/uL (ref 4.0–10.5)
nRBC: 0.5 % — ABNORMAL HIGH (ref 0.0–0.2)

## 2020-12-15 LAB — CMP (CANCER CENTER ONLY)
ALT: 21 U/L (ref 0–44)
AST: 19 U/L (ref 15–41)
Albumin: 3.9 g/dL (ref 3.5–5.0)
Alkaline Phosphatase: 63 U/L (ref 38–126)
Anion gap: 8 (ref 5–15)
BUN: 24 mg/dL — ABNORMAL HIGH (ref 8–23)
CO2: 25 mmol/L (ref 22–32)
Calcium: 9.4 mg/dL (ref 8.9–10.3)
Chloride: 104 mmol/L (ref 98–111)
Creatinine: 1.89 mg/dL — ABNORMAL HIGH (ref 0.61–1.24)
GFR, Estimated: 37 mL/min — ABNORMAL LOW (ref >60)
Glucose, Bld: 167 mg/dL — ABNORMAL HIGH (ref 70–99)
Potassium: 4.4 mmol/L (ref 3.5–5.1)
Sodium: 137 mmol/L (ref 135–145)
Total Bilirubin: 0.5 mg/dL (ref 0.3–1.2)
Total Protein: 7.1 g/dL (ref 6.5–8.1)

## 2020-12-15 MED ORDER — IOHEXOL 300 MG/ML  SOLN
75.0000 mL | Freq: Once | INTRAMUSCULAR | Status: AC | PRN
  Administered 2020-12-15: 75 mL via INTRAVENOUS

## 2020-12-18 ENCOUNTER — Ambulatory Visit: Payer: PPO | Admitting: Internal Medicine

## 2020-12-18 ENCOUNTER — Telehealth: Payer: Self-pay | Admitting: Medical Oncology

## 2020-12-18 NOTE — Telephone Encounter (Signed)
Pt no showed today for appt. Called his number , no answer.

## 2020-12-19 ENCOUNTER — Encounter: Payer: Self-pay | Admitting: Internal Medicine

## 2020-12-19 ENCOUNTER — Other Ambulatory Visit: Payer: Self-pay

## 2020-12-19 ENCOUNTER — Inpatient Hospital Stay (HOSPITAL_BASED_OUTPATIENT_CLINIC_OR_DEPARTMENT_OTHER): Payer: PPO | Admitting: Internal Medicine

## 2020-12-19 VITALS — BP 139/59 | HR 84 | Temp 97.8°F | Resp 17 | Ht 72.0 in | Wt 273.8 lb

## 2020-12-19 DIAGNOSIS — C3490 Malignant neoplasm of unspecified part of unspecified bronchus or lung: Secondary | ICD-10-CM | POA: Diagnosis not present

## 2020-12-19 DIAGNOSIS — C3431 Malignant neoplasm of lower lobe, right bronchus or lung: Secondary | ICD-10-CM | POA: Diagnosis not present

## 2020-12-19 DIAGNOSIS — C3491 Malignant neoplasm of unspecified part of right bronchus or lung: Secondary | ICD-10-CM

## 2020-12-19 DIAGNOSIS — C7972 Secondary malignant neoplasm of left adrenal gland: Secondary | ICD-10-CM

## 2020-12-19 DIAGNOSIS — C7971 Secondary malignant neoplasm of right adrenal gland: Secondary | ICD-10-CM | POA: Diagnosis not present

## 2020-12-19 NOTE — Progress Notes (Signed)
[place] [phone]   [phone]  OFFICE PROGRESS NOTE  [name], MD [place] [address]  DIAGNOSIS: Metastatic non-small cell lung cancer, adenocarcinoma initially diagnosed as stage IB (T2a, N0, M0) non-small cell lung cancer, adenocarcinoma presented with right lower lobe lung mass.  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  Biomarker Findings Tumor Mutational Burden - 11 Muts/Mb Microsatellite status - MS-Stable Genomic Findings For a complete list of the genes assayed, please refer to the Appendix. AKT3 amplification STK11 P220f*48 MDM4 amplification - equivocal? IKBKE amplification - equivocal? PARK2 [id number] PARP1 amplification - equivocal? PIK3C2B amplification - equivocal? SF3B1 K700E 8 Disease relevant genes with no reportable alterations: ALK, BRAF, EGFR, ERBB2, KRAS, MET, RET, ROS1  PDL 1 expression 0%.  PRIOR THERAPY: 1) Status post right middle and lower bilobectomies with lymph node dissection under the care of Dr. HeRoxan Hockeyn January 16, 2018. 2) status post SBRT to the metastatic lesion in the left adrenal gland in July 2020 under the care of Dr. KiSondra Come CURRENT THERAPY: Observation.  INTERVAL HISTORY: JoJUANA MONTINI89.o. male returns to the clinic today for follow-up visit.  The patient continues to complain of increasing fatigue and weakness as well as shortness of breath with exertion.  He denied having any current chest pain, cough or hemoptysis.  He denied having any fever or chills.  He has no nausea, vomiting, diarrhea or constipation.  He denied having any headache or visual changes.  He has been on observation and the patient had repeat CT scan of the chest, abdomen and pelvis performed recently and he is here for evaluation and discussion of his scan results and treatment options.  MEDICAL HISTORY: Past Medical History:  Diagnosis  Date  . Anxiety   . Arthritis   . Cancer ([facility]   skin  . Coronary artery disease   . Diabetes mellitus type 2 with complications ([facility]   not on medication was "taken off of list"; pt states that he was pre diabetic  . Dyspnea    walks 100 yards then rest  . GERD (gastroesophageal reflux disease)    ocassional no meds; pt states that its no longer an issue (01/14/18)  . History of shingles    2004  . Hyperlipidemia   . Hypertension   . lung ca dx'd 01/2018   right lung  . Metastasis to adrenal gland ([facility]belldx'd 2020  . OSA on CPAP   . Pneumonia 10/2017    ALLERGIES:  is allergic to celebrex [celecoxib].  MEDICATIONS:  Current Outpatient Medications  Medication Sig Dispense Refill  . albuterol (VENTOLIN HFA) 108 (90 Base) MCG/ACT inhaler Inhale 1-2 puffs into the lungs every 6 (six) hours as needed for wheezing or shortness of breath. 1 each 2  . aspirin 81 MG EC tablet Take 162 mg by mouth daily.     . baclofen (LIORESAL) 20 MG tablet TAKE 1 TABLET (20 MG TOTAL) BY MOUTH 2 (TWO) TIMES DAILY AS NEEDED FOR MUSCLE SPASMS. 180 tablet 1  . clonazePAM (KLONOPIN) 0.5 MG tablet Take 1 tablet (0.5 mg total) by mouth at bedtime as needed. For insomnia 30 tablet 2  . diphenhydrAMINE (BENADRYL) 25 mg capsule Take 25 mg by mouth at bedtime.     . Marland Kitchenetoconazole (NIZORAL) 2 % cream Apply 1 application topically 2 (two) times daily as needed for irritation.    .Marland Kitchen  lisinopril (ZESTRIL) 10 MG tablet Take 1 tablet (10 mg total) by mouth daily. 90 tablet 3  . Melatonin 10 MG TABS Take 10 mg by mouth at bedtime.     . metFORMIN (GLUCOPHAGE) 500 MG tablet Take 1 tablet (500 mg total) by mouth daily with breakfast. 90 tablet 3  . metoprolol succinate (TOPROL-XL) 50 MG 24 hr tablet Take 1 tablet (50 mg total) by mouth daily. Take with or immediately following a meal. 90 tablet 3  . nitroGLYCERIN (NITROSTAT) 0.4 MG SL tablet Place 1 tablet (0.4 mg total) under the tongue every 5 (five) minutes as needed for  chest pain. 25 tablet 1  . simvastatin (ZOCOR) 40 MG tablet TAKE 1 TABLET BY MOUTH EVERYDAY AT BEDTIME 90 tablet 3  . vitamin E 100 UNIT capsule Take 100 Units by mouth daily.     No current facility-administered medications for this visit.    SURGICAL HISTORY:  Past Surgical History:  Procedure Laterality Date  . CARDIAC CATHETERIZATION     2010 @ [facility] (per pt)  . COLONOSCOPY W/ POLYPECTOMY    . EYE SURGERY Bilateral    cataracts  . OPEN REDUCTION INTERNAL FIXATION (ORIF) DISTAL RADIAL FRACTURE Right 11/26/2013   Procedure: OPEN REDUCTION INTERNAL FIXATION (ORIF) DISTAL RADIAL FRACTURE;  Surgeon: Newt Minion, MD;  Location: [facility];  Service: Orthopedics;  Laterality: Right;  Open Reduction Internal Fixation Right Distal Radius   . VIDEO ASSISTED THORACOSCOPY (VATS)/ LOBECTOMY Right 01/16/2018   Procedure: VIDEO ASSISTED THORACOSCOPY (VATS)/RIGHT MIDDLE AND LOWER LOBE LUNG LOBECTOMY WITH NODE BIOPSIES x6;  Surgeon: Melrose Nakayama, MD;  Location: [facility];  Service: Thoracic;  Laterality: Right;  Marland Kitchen VIDEO BRONCHOSCOPY WITH ENDOBRONCHIAL NAVIGATION Right 12/15/2017   Procedure: VIDEO BRONCHOSCOPY WITH ENDOBRONCHIAL NAVIGATION;  Surgeon: Melrose Nakayama, MD;  Location: [facility];  Service: Thoracic;  Laterality: Right;  Marland Kitchen VIDEO BRONCHOSCOPY WITH ENDOBRONCHIAL ULTRASOUND N/A 12/15/2017   Procedure: VIDEO BRONCHOSCOPY WITH ENDOBRONCHIAL ULTRASOUND;  Surgeon: Melrose Nakayama, MD;  Location: [facility];  Service: Thoracic;  Laterality: N/A;    REVIEW OF SYSTEMS:  Constitutional: positive for fatigue Eyes: negative Ears, nose, mouth, throat, and face: negative Respiratory: positive for dyspnea on exertion Cardiovascular: negative Gastrointestinal: negative Genitourinary:negative Integument/breast: negative Hematologic/lymphatic: negative Musculoskeletal:negative Neurological: negative Behavioral/Psych: negative Endocrine: negative Allergic/Immunologic: negative   PHYSICAL  EXAMINATION: General appearance: alert, cooperative, fatigued and no distress Head: Normocephalic, without obvious abnormality, atraumatic Neck: no adenopathy, no JVD, supple, symmetrical, trachea midline and thyroid not enlarged, symmetric, no tenderness/mass/nodules Lymph nodes: Cervical, supraclavicular, and axillary nodes normal. Resp: clear to auscultation bilaterally Back: symmetric, no curvature. ROM normal. No CVA tenderness. Cardio: regular rate and rhythm, S1, S2 normal, no murmur, click, rub or gallop GI: soft, non-tender; bowel sounds normal; no masses,  no organomegaly Extremities: extremities normal, atraumatic, no cyanosis or edema Neurologic: Alert and oriented X 3, normal strength and tone. Normal symmetric reflexes. Normal coordination and gait  ECOG PERFORMANCE STATUS: 1 - Symptomatic but completely ambulatory  Blood pressure (!) 139/59, pulse 84, temperature 97.8 F (36.6 C), temperature source Tympanic, resp. rate 17, height 6' (1.829 m), weight 273 lb 12.8 oz (124.2 kg), SpO2 95 %.  LABORATORY DATA: Lab Results  Component Value Date   WBC 7.3 12/15/2020   HGB 11.4 (L) 12/15/2020   HCT 33.7 (L) 12/15/2020   MCV 104.7 (H) 12/15/2020   PLT 183 12/15/2020      Chemistry      Component Value Date/Time  NA 137 12/15/2020 1134   K 4.4 12/15/2020 1134   CL 104 12/15/2020 1134   CO2 25 12/15/2020 1134   BUN 24 (H) 12/15/2020 1134   CREATININE 1.89 (H) 12/15/2020 1134   CREATININE 0.99 07/11/2016 1448      Component Value Date/Time   CALCIUM 9.4 12/15/2020 1134   ALKPHOS 63 12/15/2020 1134   AST 19 12/15/2020 1134   ALT 21 12/15/2020 1134   BILITOT 0.5 12/15/2020 1134       RADIOGRAPHIC STUDIES: CT Chest W Contrast  Result Date: 12/15/2020 CLINICAL DATA:  Non-small-cell lung cancer.  Restaging. EXAM: CT CHEST, ABDOMEN, AND PELVIS WITH CONTRAST TECHNIQUE: Multidetector CT imaging of the chest, abdomen and pelvis was performed following the standard  protocol during bolus administration of intravenous contrast. CONTRAST:  58m OMNIPAQUE IOHEXOL 300 MG/ML  SOLN COMPARISON:  06/13/2020 FINDINGS: CT CHEST FINDINGS Cardiovascular: The heart size is normal. No substantial pericardial effusion. Coronary artery calcification is evident. Atherosclerotic calcification is noted in the wall of the thoracic aorta. Mediastinum/Nodes: No mediastinal lymphadenopathy. There is no hilar lymphadenopathy. The esophagus has normal imaging features. There is no axillary lymphadenopathy. Lungs/Pleura: Postsurgical changes noted right hilum with volume loss in the right hemithorax consistent with reported history of right middle and lower lobectomy. 6 mm right pulmonary nodule on 83/7 is unchanged in the interval. 2 mm anterior left upper lobe nodule on 62/7 is unchanged. Posterior right pleural thickening versus trace chronic pleural fluid collection is unchanged. Musculoskeletal: No worrisome lytic or sclerotic osseous abnormality. CT ABDOMEN PELVIS FINDINGS Hepatobiliary: Tiny hypodensity in the dome of the liver is stable. There is no evidence for gallstones, gallbladder wall thickening, or pericholecystic fluid. No intrahepatic or extrahepatic biliary dilation. Pancreas: No focal mass lesion. No dilatation of the main duct. No intraparenchymal cyst. No peripancreatic edema. Spleen: No splenomegaly. No focal mass lesion. Adrenals/Urinary Tract: 1.9 cm right adrenal nodule has increased in size from 1.0 cm previously (remeasured). Stable appearance of the left adrenal gland with some nodular thickening but no definite or discrete nodule/mass. Vascular calcification noted in the hilum of each kidney without definite renal stone disease. Small cyst noted upper interpolar left kidney, stable. No evidence for hydroureter. The urinary bladder appears normal for the degree of distention. Stomach/Bowel: Stomach is unremarkable. No gastric wall thickening. No evidence of outlet  obstruction. Duodenum is normally positioned as is the ligament of Treitz. No small bowel wall thickening. No small bowel dilatation. The terminal ileum is normal. The appendix is normal. No gross colonic mass. No colonic wall thickening. Vascular/Lymphatic: There is abdominal aortic atherosclerosis without aneurysm. There is no gastrohepatic or hepatoduodenal ligament lymphadenopathy. No retroperitoneal or mesenteric lymphadenopathy. No pelvic sidewall lymphadenopathy. Reproductive: The prostate gland and seminal vesicles are unremarkable. Other: No intraperitoneal free fluid. Musculoskeletal: Avascular necrosis noted right femoral head. No worrisome lytic or sclerotic osseous abnormality. IMPRESSION: 1. Postsurgical changes in the right hemithorax as before. 6 mm right upper lobe pulmonary nodule seen to have increased on the previous study has remained stable at 6 mm since that time. 2. 1.9 cm right adrenal nodule has increased in size from 1.0 cm previously. This raises concern for progressive metastatic disease. The previously characterized decreasing left adrenal nodule remains stable in the interval, essentially imperceptible. 3. 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 4. Aortic Atherosclerosis (ICD10-I70.0). Electronically Signed   By: EMisty StanleyM.D.   On: 12/15/2020 15:39   CT Abdomen Pelvis W Contrast  Result Date: 12/15/2020 CLINICAL DATA:  Non-small-cell lung cancer.  Restaging. EXAM: CT CHEST, ABDOMEN, AND PELVIS WITH CONTRAST TECHNIQUE: Multidetector CT imaging of the chest, abdomen and pelvis was performed following the standard protocol during bolus administration of intravenous contrast. CONTRAST:  45m OMNIPAQUE IOHEXOL 300 MG/ML  SOLN COMPARISON:  06/13/2020 FINDINGS: CT CHEST FINDINGS Cardiovascular: The heart size is normal. No substantial pericardial effusion. Coronary artery calcification is evident. Atherosclerotic calcification is noted in the wall of the thoracic aorta.  Mediastinum/Nodes: No mediastinal lymphadenopathy. There is no hilar lymphadenopathy. The esophagus has normal imaging features. There is no axillary lymphadenopathy. Lungs/Pleura: Postsurgical changes noted right hilum with volume loss in the right hemithorax consistent with reported history of right middle and lower lobectomy. 6 mm right pulmonary nodule on 83/7 is unchanged in the interval. 2 mm anterior left upper lobe nodule on 62/7 is unchanged. Posterior right pleural thickening versus trace chronic pleural fluid collection is unchanged. Musculoskeletal: No worrisome lytic or sclerotic osseous abnormality. CT ABDOMEN PELVIS FINDINGS Hepatobiliary: Tiny hypodensity in the dome of the liver is stable. There is no evidence for gallstones, gallbladder wall thickening, or pericholecystic fluid. No intrahepatic or extrahepatic biliary dilation. Pancreas: No focal mass lesion. No dilatation of the main duct. No intraparenchymal cyst. No peripancreatic edema. Spleen: No splenomegaly. No focal mass lesion. Adrenals/Urinary Tract: 1.9 cm right adrenal nodule has increased in size from 1.0 cm previously (remeasured). Stable appearance of the left adrenal gland with some nodular thickening but no definite or discrete nodule/mass. Vascular calcification noted in the hilum of each kidney without definite renal stone disease. Small cyst noted upper interpolar left kidney, stable. No evidence for hydroureter. The urinary bladder appears normal for the degree of distention. Stomach/Bowel: Stomach is unremarkable. No gastric wall thickening. No evidence of outlet obstruction. Duodenum is normally positioned as is the ligament of Treitz. No small bowel wall thickening. No small bowel dilatation. The terminal ileum is normal. The appendix is normal. No gross colonic mass. No colonic wall thickening. Vascular/Lymphatic: There is abdominal aortic atherosclerosis without aneurysm. There is no gastrohepatic or hepatoduodenal  ligament lymphadenopathy. No retroperitoneal or mesenteric lymphadenopathy. No pelvic sidewall lymphadenopathy. Reproductive: The prostate gland and seminal vesicles are unremarkable. Other: No intraperitoneal free fluid. Musculoskeletal: Avascular necrosis noted right femoral head. No worrisome lytic or sclerotic osseous abnormality. IMPRESSION: 1. Postsurgical changes in the right hemithorax as before. 6 mm right upper lobe pulmonary nodule seen to have increased on the previous study has remained stable at 6 mm since that time. 2. 1.9 cm right adrenal nodule has increased in size from 1.0 cm previously. This raises concern for progressive metastatic disease. The previously characterized decreasing left adrenal nodule remains stable in the interval, essentially imperceptible. 3. 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 4. Aortic Atherosclerosis (ICD10-I70.0). Electronically Signed   By: EMisty StanleyM.D.   On: 12/15/2020 15:39    ASSESSMENT AND PLAN: This is a very pleasant [age]years old white male with a stage IB non-small cell lung cancer, adenocarcinoma status post right middle and lower bilobectomies with lymph node dissection under the care of Dr. HRoxan Hockeyon January 16, 2018.   The patient is currently on observation and he is feeling fine with no concerning complaints. Recent CT scan of the chest, abdomen and pelvis showed increase in the size of left adrenal mass suspicious for adrenal metastasis.  These findings were confirmed with the PET scan. The patient underwent CT-guided core biopsy of the left adrenal gland lesion and it was consistent with metastatic  adenocarcinoma from likely lung primary. The patient underwent SBRT to the left adrenal gland lesion under the care of Dr. Sondra Come and he tolerated the procedure well. The patient is currently on observation and he is feeling fine except for the fatigue and shortness of breath with exertion. He had repeat CT scan of the chest, abdomen  pelvis performed recently.  I personally and independently reviewed the scan images and discussed the results with the patient today. His scan showed no concerning findings for disease progression in the chest and there was improvement of the left adrenal gland lesion but unfortunately he has progressive and enlarging right gland lesion suspicious for metastatic disease. I had a lengthy discussion with the patient today about his current condition and treatment options.  The patient indicated that he is not interested in any systemic chemotherapy. I recommended for him to see Dr. Sondra Come for consideration of SBRT to the enlarging right adrenal gland lesion followed by continuous observation and monitoring for any other evidence of disease recurrence or metastasis. The patient agreed to the current plan. I will see him back for follow-up visit in 3 months for evaluation with repeat CT scan of the chest, abdomen pelvis for restaging of his disease. He was advised to call immediately if he has any other concerning symptoms in the interval.  The patient voices understanding of current disease status and treatment options and is in agreement with the current care plan. All questions were answered. The patient knows to call the clinic with any problems, questions or concerns. We can certainly see the patient much sooner if necessary.  Disclaimer: This note was dictated with voice recognition software. Similar sounding words can inadvertently be transcribed and may not be corrected upon review.

## 2020-12-22 ENCOUNTER — Ambulatory Visit: Payer: PPO

## 2020-12-22 ENCOUNTER — Ambulatory Visit: Payer: PPO | Admitting: Radiation Oncology

## 2020-12-28 ENCOUNTER — Encounter: Payer: Self-pay | Admitting: Radiation Oncology

## 2020-12-28 ENCOUNTER — Ambulatory Visit
Admission: RE | Admit: 2020-12-28 | Discharge: 2020-12-28 | Disposition: A | Discharge status: Home / Self Care | Payer: PPO | Source: Ambulatory Visit | Attending: Radiation Oncology | Admitting: Radiation Oncology

## 2020-12-28 ENCOUNTER — Other Ambulatory Visit: Payer: Self-pay

## 2020-12-28 DIAGNOSIS — C7971 Secondary malignant neoplasm of right adrenal gland: Secondary | ICD-10-CM

## 2020-12-28 DIAGNOSIS — C7972 Secondary malignant neoplasm of left adrenal gland: Secondary | ICD-10-CM

## 2020-12-28 DIAGNOSIS — C3431 Malignant neoplasm of lower lobe, right bronchus or lung: Secondary | ICD-10-CM | POA: Diagnosis not present

## 2020-12-28 NOTE — Progress Notes (Signed)
Radiation Oncology         [phone] ________________________________  Name: Christian [name] MRN: [id number]  Date: 12/28/2020  DOB: 03/12/1946  Re-Evaluation Note  CC: [name], MD  Curt Bears, MD    ICD-10-CM   1. 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 He was last seen on 07/09/2019 during final treatment directed at the abdomen. Since then, he has been followed closely by Dr. Julien Nordmann. He remained under observation with serial imaging.  Most recent CT scan of chest/abdomen/pelvis on 12/15/2020 showed a 6 mm right upper lobe pulmonary nodule that remained stable. However, there was a 1.9 cm right adrenal nodule that had increased in size from 1.0 cm previously, raising concern for progressive metastatic disease. The previously characterized decreasing left adrenal nodule remained stable in the interval, essentially imperceptible.   The patient was last seen by Dr. Julien Nordmann on 12/19/2020. At that time, the candidate indicated that he was not interested in any systemic chemotherapy. Thus, it was recommendation that he be considered for SBRT to the enlarging right adrenal gland lesion followed by continuous observation and monitoring for any other evidence of disease recurrence or metastasis.  On review of systems, the patient reports feeling well. He denies any abdominal pain cough or breathing problems. He denies any headaches or blurred vision..    Allergies:  is allergic to celebrex [celecoxib].  Meds: Current  Outpatient Medications  Medication Sig Dispense Refill  . albuterol (VENTOLIN HFA) 108 (90 Base) MCG/ACT inhaler Inhale 1-2 puffs into the lungs every 6 (six) hours as needed for wheezing or shortness of breath. 1 each 2  . aspirin 81 MG EC tablet Take 162 mg by mouth daily.     . baclofen (LIORESAL) 20 MG tablet TAKE 1 TABLET (20 MG TOTAL) BY MOUTH 2 (TWO) TIMES DAILY AS NEEDED FOR MUSCLE SPASMS. 180 tablet 1  . clonazePAM (KLONOPIN) 0.5 MG tablet Take 1 tablet (0.5 mg total) by mouth at bedtime as needed. For insomnia 30 tablet 2  . diphenhydrAMINE (BENADRYL) 25 mg capsule Take 25 mg by mouth at bedtime.     Marland Kitchen ketoconazole (NIZORAL) 2 % cream Apply 1 application topically 2 (two) times daily as needed for irritation.    Marland Kitchen lisinopril (ZESTRIL) 10 MG tablet Take 1 tablet (10 mg total) by mouth daily. 90 tablet 3  . Melatonin 10 MG TABS Take 10 mg by mouth at bedtime.     . metFORMIN (GLUCOPHAGE) 500 MG tablet Take 1 tablet (500 mg total) by mouth daily with breakfast. 90 tablet 3  . metoprolol succinate (TOPROL-XL) 50 MG 24 hr tablet Take 1 tablet (50 mg total) by mouth daily. Take with or immediately following a meal. 90 tablet 3  . nitroGLYCERIN (NITROSTAT) 0.4 MG SL tablet Place 1 tablet (0.4 mg total) under the tongue every 5 (five) minutes as needed for chest pain. 25 tablet 1  . simvastatin (ZOCOR) 40 MG tablet TAKE 1 TABLET BY MOUTH EVERYDAY AT BEDTIME 90 tablet 3  . vitamin E 100 UNIT capsule Take 100 Units by mouth daily.  No current facility-administered medications for this encounter.    Physical Findings: The patient is in no acute distress. Patient is alert and oriented.  height is 6' (1.829 m) and weight is 264 lb 9.6 oz (120 kg). His temperature is 98.2 F (36.8 C). His blood pressure is 114/82 and his pulse is 70. His respiration is 24 (abnormal) and oxygen saturation is 97%.  No significant changes. Lungs are clear to auscultation bilaterally. Heart has regular rate and  rhythm. No palpable cervical, supraclavicular, or axillary adenopathy. Abdomen soft, non-tender, normal bowel sounds.   Lab Findings: Lab Results  Component Value Date   WBC 7.3 12/15/2020   HGB 11.4 (L) 12/15/2020   HCT 33.7 (L) 12/15/2020   MCV 104.7 (H) 12/15/2020   PLT 183 12/15/2020    Radiographic Findings: CT Chest W Contrast  Result Date: 12/15/2020 CLINICAL DATA:  Non-small-cell lung cancer.  Restaging. EXAM: CT CHEST, ABDOMEN, AND PELVIS WITH CONTRAST TECHNIQUE: Multidetector CT imaging of the chest, abdomen and pelvis was performed following the standard protocol during bolus administration of intravenous contrast. CONTRAST:  74mL OMNIPAQUE IOHEXOL 300 MG/ML  SOLN COMPARISON:  06/13/2020 FINDINGS: CT CHEST FINDINGS Cardiovascular: The heart size is normal. No substantial pericardial effusion. Coronary artery calcification is evident. Atherosclerotic calcification is noted in the wall of the thoracic aorta. Mediastinum/Nodes: No mediastinal lymphadenopathy. There is no hilar lymphadenopathy. The esophagus has normal imaging features. There is no axillary lymphadenopathy. Lungs/Pleura: Postsurgical changes noted right hilum with volume loss in the right hemithorax consistent with reported history of right middle and lower lobectomy. 6 mm right pulmonary nodule on 83/7 is unchanged in the interval. 2 mm anterior left upper lobe nodule on 62/7 is unchanged. Posterior right pleural thickening versus trace chronic pleural fluid collection is unchanged. Musculoskeletal: No worrisome lytic or sclerotic osseous abnormality. CT ABDOMEN PELVIS FINDINGS Hepatobiliary: Tiny hypodensity in the dome of the liver is stable. There is no evidence for gallstones, gallbladder wall thickening, or pericholecystic fluid. No intrahepatic or extrahepatic biliary dilation. Pancreas: No focal mass lesion. No dilatation of the main duct. No intraparenchymal cyst. No peripancreatic edema. Spleen: No splenomegaly. No  focal mass lesion. Adrenals/Urinary Tract: 1.9 cm right adrenal nodule has increased in size from 1.0 cm previously (remeasured). Stable appearance of the left adrenal gland with some nodular thickening but no definite or discrete nodule/mass. Vascular calcification noted in the hilum of each kidney without definite renal stone disease. Small cyst noted upper interpolar left kidney, stable. No evidence for hydroureter. The urinary bladder appears normal for the degree of distention. Stomach/Bowel: Stomach is unremarkable. No gastric wall thickening. No evidence of outlet obstruction. Duodenum is normally positioned as is the ligament of Treitz. No small bowel wall thickening. No small bowel dilatation. The terminal ileum is normal. The appendix is normal. No gross colonic mass. No colonic wall thickening. Vascular/Lymphatic: There is abdominal aortic atherosclerosis without aneurysm. There is no gastrohepatic or hepatoduodenal ligament lymphadenopathy. No retroperitoneal or mesenteric lymphadenopathy. No pelvic sidewall lymphadenopathy. Reproductive: The prostate gland and seminal vesicles are unremarkable. Other: No intraperitoneal free fluid. Musculoskeletal: Avascular necrosis noted right femoral head. No worrisome lytic or sclerotic osseous abnormality. IMPRESSION: 1. Postsurgical changes in the right hemithorax as before. 6 mm right upper lobe pulmonary nodule seen to have increased on the previous study has remained stable at 6 mm since that time. 2. 1.9 cm right adrenal nodule has increased in size from 1.0 cm previously. This raises concern for progressive metastatic disease. The previously characterized  decreasing left adrenal nodule remains stable in the interval, essentially imperceptible. 3. 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 4. Aortic Atherosclerosis (ICD10-I70.0). Electronically Signed   By: Misty Stanley M.D.   On: 12/15/2020 15:39   CT Abdomen Pelvis W Contrast  Result Date:  12/15/2020 CLINICAL DATA:  Non-small-cell lung cancer.  Restaging. EXAM: CT CHEST, ABDOMEN, AND PELVIS WITH CONTRAST TECHNIQUE: Multidetector CT imaging of the chest, abdomen and pelvis was performed following the standard protocol during bolus administration of intravenous contrast. CONTRAST:  8mL OMNIPAQUE IOHEXOL 300 MG/ML  SOLN COMPARISON:  06/13/2020 FINDINGS: CT CHEST FINDINGS Cardiovascular: The heart size is normal. No substantial pericardial effusion. Coronary artery calcification is evident. Atherosclerotic calcification is noted in the wall of the thoracic aorta. Mediastinum/Nodes: No mediastinal lymphadenopathy. There is no hilar lymphadenopathy. The esophagus has normal imaging features. There is no axillary lymphadenopathy. Lungs/Pleura: Postsurgical changes noted right hilum with volume loss in the right hemithorax consistent with reported history of right middle and lower lobectomy. 6 mm right pulmonary nodule on 83/7 is unchanged in the interval. 2 mm anterior left upper lobe nodule on 62/7 is unchanged. Posterior right pleural thickening versus trace chronic pleural fluid collection is unchanged. Musculoskeletal: No worrisome lytic or sclerotic osseous abnormality. CT ABDOMEN PELVIS FINDINGS Hepatobiliary: Tiny hypodensity in the dome of the liver is stable. There is no evidence for gallstones, gallbladder wall thickening, or pericholecystic fluid. No intrahepatic or extrahepatic biliary dilation. Pancreas: No focal mass lesion. No dilatation of the main duct. No intraparenchymal cyst. No peripancreatic edema. Spleen: No splenomegaly. No focal mass lesion. Adrenals/Urinary Tract: 1.9 cm right adrenal nodule has increased in size from 1.0 cm previously (remeasured). Stable appearance of the left adrenal gland with some nodular thickening but no definite or discrete nodule/mass. Vascular calcification noted in the hilum of each kidney without definite renal stone disease. Small cyst noted upper  interpolar left kidney, stable. No evidence for hydroureter. The urinary bladder appears normal for the degree of distention. Stomach/Bowel: Stomach is unremarkable. No gastric wall thickening. No evidence of outlet obstruction. Duodenum is normally positioned as is the ligament of Treitz. No small bowel wall thickening. No small bowel dilatation. The terminal ileum is normal. The appendix is normal. No gross colonic mass. No colonic wall thickening. Vascular/Lymphatic: There is abdominal aortic atherosclerosis without aneurysm. There is no gastrohepatic or hepatoduodenal ligament lymphadenopathy. No retroperitoneal or mesenteric lymphadenopathy. No pelvic sidewall lymphadenopathy. Reproductive: The prostate gland and seminal vesicles are unremarkable. Other: No intraperitoneal free fluid. Musculoskeletal: Avascular necrosis noted right femoral head. No worrisome lytic or sclerotic osseous abnormality. IMPRESSION: 1. Postsurgical changes in the right hemithorax as before. 6 mm right upper lobe pulmonary nodule seen to have increased on the previous study has remained stable at 6 mm since that time. 2. 1.9 cm right adrenal nodule has increased in size from 1.0 cm previously. This raises concern for progressive metastatic disease. The previously characterized decreasing left adrenal nodule remains stable in the interval, essentially imperceptible. 3. 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 4. Aortic Atherosclerosis (ICD10-I70.0). Electronically Signed   By: Misty Stanley M.D.   On: 12/15/2020 15:39    Impression: Metastatic non-small cell lung cancer, adenocarcinoma initially diagnosed as stage IB (T2a, N0, M0) non-small cell lung cancer, adenocarcinoma presented with right lower lobe lung mass. Now withRight  adrenal gland oligometastasis.  The patient will be a good candidate for SBRT directed at his oligometastasis involving the right adrenal gland. This appears to be the only  site of active disease at  this time. I discussed the course of treatment side effects and potential toxicities of radiation therapy directed at the upper abdominal region with the patient. He appears to understand and with his previous treatment understands this well. He wishes to proceed with planned course of treatment  Plan: The patient will return next week for SBRT planning directed at his solitary metastasis in the right adrenal gland. Anticipate 5 treatments  Total time spent in this encounter was 35 minutes which included reviewing the patient's most recent CT scans, follow-ups, physical examination, and documentation.  -----------------------------------  Blair Promise, PhD, MD  This document serves as a record of services personally performed by Gery Pray, MD. It was created on his behalf by Clerance Lav, a trained medical scribe. The creation of this record is based on the scribe's personal observations and the provider's statements to them. This document has been checked and approved by the attending provider.

## 2020-12-28 NOTE — Progress Notes (Signed)
GU Location of Tumor / Histology: right Adrenal gland   Christian [name] was referred by Dr Julien Nordmann for enlarging right adrenal gland.  Biopsies n/a  Past/Anticipated interventions by urology, if any: none  Past/Anticipated interventions by medical oncology, if any: none  Weight changes, if any: no  Bowel/Bladder complaints, if any: increased frequency of urine  Nausea/Vomiting, if any: none  Pain issues, if any:  none  SAFETY ISSUES:  Prior radiation? Yes left adrenal gland SBRT completed 06/2019  Pacemaker/ICD? no  Possible current pregnancy? no  Is the patient on methotrexate? no  Current Complaints / other details:  Patient was referred by Dr Julien Nordmann. He wants to know what the adrenal gland does and if the radiation is done on both of them how will that impact his life.  Vitals:   12/28/20 1301  BP: 114/82  Pulse: 70  Resp: (!) 24  Temp: 98.2 F (36.8 C)  SpO2: 97%  Weight: 264 lb 9.6 oz (120 kg)  Height: 6' (1.829 m)   Rhae Hammock, RN BSN

## 2020-12-28 NOTE — Progress Notes (Signed)
See md note for nursing note

## 2021-01-02 ENCOUNTER — Ambulatory Visit
Admission: RE | Admit: 2021-01-02 | Discharge: 2021-01-02 | Disposition: A | Discharge status: Home / Self Care | Payer: PPO | Source: Ambulatory Visit | Attending: Radiation Oncology | Admitting: Radiation Oncology

## 2021-01-02 ENCOUNTER — Other Ambulatory Visit: Payer: Self-pay

## 2021-01-02 DIAGNOSIS — C3431 Malignant neoplasm of lower lobe, right bronchus or lung: Secondary | ICD-10-CM | POA: Insufficient documentation

## 2021-01-02 DIAGNOSIS — C7971 Secondary malignant neoplasm of right adrenal gland: Secondary | ICD-10-CM

## 2021-01-02 DIAGNOSIS — C7972 Secondary malignant neoplasm of left adrenal gland: Secondary | ICD-10-CM | POA: Insufficient documentation

## 2021-01-03 DIAGNOSIS — C7971 Secondary malignant neoplasm of right adrenal gland: Secondary | ICD-10-CM | POA: Diagnosis not present

## 2021-01-03 DIAGNOSIS — C3431 Malignant neoplasm of lower lobe, right bronchus or lung: Secondary | ICD-10-CM | POA: Diagnosis not present

## 2021-01-09 ENCOUNTER — Ambulatory Visit
Admission: RE | Admit: 2021-01-09 | Discharge: 2021-01-09 | Disposition: A | Discharge status: Home / Self Care | Payer: PPO | Source: Ambulatory Visit | Attending: Radiation Oncology | Admitting: Radiation Oncology

## 2021-01-09 ENCOUNTER — Other Ambulatory Visit: Payer: Self-pay

## 2021-01-09 DIAGNOSIS — C7971 Secondary malignant neoplasm of right adrenal gland: Secondary | ICD-10-CM

## 2021-01-10 ENCOUNTER — Telehealth: Payer: Self-pay | Admitting: Internal Medicine

## 2021-01-10 ENCOUNTER — Ambulatory Visit: Payer: PPO | Admitting: Radiation Oncology

## 2021-01-10 NOTE — Telephone Encounter (Signed)
Left message with upcoming appointments. Gave option to call back if needed.

## 2021-01-11 ENCOUNTER — Ambulatory Visit
Admission: RE | Admit: 2021-01-11 | Discharge: 2021-01-11 | Disposition: A | Discharge status: Home / Self Care | Payer: PPO | Source: Ambulatory Visit | Attending: Radiation Oncology | Admitting: Radiation Oncology

## 2021-01-11 ENCOUNTER — Other Ambulatory Visit: Payer: Self-pay

## 2021-01-11 DIAGNOSIS — C7971 Secondary malignant neoplasm of right adrenal gland: Secondary | ICD-10-CM

## 2021-01-12 ENCOUNTER — Ambulatory Visit: Payer: PPO | Admitting: Radiation Oncology

## 2021-01-15 ENCOUNTER — Ambulatory Visit
Admission: RE | Admit: 2021-01-15 | Discharge: 2021-01-15 | Disposition: A | Discharge status: Home / Self Care | Payer: PPO | Source: Ambulatory Visit | Attending: Radiation Oncology | Admitting: Radiation Oncology

## 2021-01-15 ENCOUNTER — Other Ambulatory Visit: Payer: Self-pay

## 2021-01-15 DIAGNOSIS — C7971 Secondary malignant neoplasm of right adrenal gland: Secondary | ICD-10-CM | POA: Diagnosis not present

## 2021-01-17 ENCOUNTER — Ambulatory Visit
Admission: RE | Admit: 2021-01-17 | Discharge: 2021-01-17 | Disposition: A | Discharge status: Home / Self Care | Payer: PPO | Source: Ambulatory Visit | Attending: Radiation Oncology | Admitting: Radiation Oncology

## 2021-01-17 ENCOUNTER — Other Ambulatory Visit: Payer: Self-pay

## 2021-01-17 DIAGNOSIS — C7971 Secondary malignant neoplasm of right adrenal gland: Secondary | ICD-10-CM | POA: Diagnosis not present

## 2021-01-19 ENCOUNTER — Other Ambulatory Visit: Payer: Self-pay

## 2021-01-19 ENCOUNTER — Ambulatory Visit
Admission: RE | Admit: 2021-01-19 | Discharge: 2021-01-19 | Disposition: A | Discharge status: Home / Self Care | Payer: PPO | Source: Ambulatory Visit | Attending: Radiation Oncology | Admitting: Radiation Oncology

## 2021-01-19 DIAGNOSIS — C3431 Malignant neoplasm of lower lobe, right bronchus or lung: Secondary | ICD-10-CM | POA: Diagnosis not present

## 2021-01-19 DIAGNOSIS — C7971 Secondary malignant neoplasm of right adrenal gland: Secondary | ICD-10-CM | POA: Diagnosis not present

## 2021-01-26 ENCOUNTER — Telehealth: Payer: Self-pay

## 2021-01-26 NOTE — Telephone Encounter (Signed)
Pt left v/m requesting refill for clonazepam. I spoke with Elberta Fortis at [facility] and pt has 2 available refills on clonazepam that [facility] will have one refill for clonzepam 0.5 mg ready for pick up in 1 hr with cost to pt being $5.35. pt notified and voiced understanding and was appreciative and nothing further needed at this time.

## 2021-03-16 ENCOUNTER — Other Ambulatory Visit: Payer: Self-pay

## 2021-03-16 ENCOUNTER — Inpatient Hospital Stay: Payer: PPO | Attending: Internal Medicine

## 2021-03-16 DIAGNOSIS — C342 Malignant neoplasm of middle lobe, bronchus or lung: Secondary | ICD-10-CM | POA: Insufficient documentation

## 2021-03-16 DIAGNOSIS — C3431 Malignant neoplasm of lower lobe, right bronchus or lung: Secondary | ICD-10-CM | POA: Insufficient documentation

## 2021-03-16 DIAGNOSIS — C7972 Secondary malignant neoplasm of left adrenal gland: Secondary | ICD-10-CM | POA: Diagnosis not present

## 2021-03-16 DIAGNOSIS — Z87891 Personal history of nicotine dependence: Secondary | ICD-10-CM | POA: Diagnosis not present

## 2021-03-16 DIAGNOSIS — E279 Disorder of adrenal gland, unspecified: Secondary | ICD-10-CM | POA: Diagnosis not present

## 2021-03-16 DIAGNOSIS — R5383 Other fatigue: Secondary | ICD-10-CM | POA: Diagnosis not present

## 2021-03-16 DIAGNOSIS — R0609 Other forms of dyspnea: Secondary | ICD-10-CM | POA: Diagnosis not present

## 2021-03-16 DIAGNOSIS — C3490 Malignant neoplasm of unspecified part of unspecified bronchus or lung: Secondary | ICD-10-CM

## 2021-03-16 LAB — CMP (CANCER CENTER ONLY)
ALT: 17 U/L (ref 0–44)
AST: 18 U/L (ref 15–41)
Albumin: 3.9 g/dL (ref 3.5–5.0)
Alkaline Phosphatase: 62 U/L (ref 38–126)
Anion gap: 11 (ref 5–15)
BUN: 18 mg/dL (ref 8–23)
CO2: 20 mmol/L — ABNORMAL LOW (ref 22–32)
Calcium: 8.5 mg/dL — ABNORMAL LOW (ref 8.9–10.3)
Chloride: 109 mmol/L (ref 98–111)
Creatinine: 1.4 mg/dL — ABNORMAL HIGH (ref 0.61–1.24)
GFR, Estimated: 53 mL/min — ABNORMAL LOW (ref >60)
Glucose, Bld: 158 mg/dL — ABNORMAL HIGH (ref 70–99)
Potassium: 4.6 mmol/L (ref 3.5–5.1)
Sodium: 140 mmol/L (ref 135–145)
Total Bilirubin: 0.6 mg/dL (ref 0.3–1.2)
Total Protein: 6.8 g/dL (ref 6.5–8.1)

## 2021-03-16 LAB — CBC WITH DIFFERENTIAL (CANCER CENTER ONLY)
Abs Immature Granulocytes: 0.02 10*3/uL (ref 0.00–0.07)
Basophils Absolute: 0.1 10*3/uL (ref 0.0–0.1)
Basophils Relative: 2 %
Eosinophils Absolute: 0.3 10*3/uL (ref 0.0–0.5)
Eosinophils Relative: 5 %
HCT: 33.6 % — ABNORMAL LOW (ref 39.0–52.0)
Hemoglobin: 11.2 g/dL — ABNORMAL LOW (ref 13.0–17.0)
Immature Granulocytes: 0 %
Lymphocytes Relative: 22 %
Lymphs Abs: 1.4 10*3/uL (ref 0.7–4.0)
MCH: 34.9 pg — ABNORMAL HIGH (ref 26.0–34.0)
MCHC: 33.3 g/dL (ref 30.0–36.0)
MCV: 104.7 fL — ABNORMAL HIGH (ref 80.0–100.0)
Monocytes Absolute: 0.8 10*3/uL (ref 0.1–1.0)
Monocytes Relative: 13 %
Neutro Abs: 3.6 10*3/uL (ref 1.7–7.7)
Neutrophils Relative %: 58 %
Platelet Count: 140 10*3/uL — ABNORMAL LOW (ref 150–400)
RBC: 3.21 MIL/uL — ABNORMAL LOW (ref 4.22–5.81)
RDW: 14.7 % (ref 11.5–15.5)
WBC Count: 6.3 10*3/uL (ref 4.0–10.5)
nRBC: 0.3 % — ABNORMAL HIGH (ref 0.0–0.2)

## 2021-03-19 ENCOUNTER — Inpatient Hospital Stay (HOSPITAL_BASED_OUTPATIENT_CLINIC_OR_DEPARTMENT_OTHER): Payer: PPO | Admitting: Internal Medicine

## 2021-03-19 ENCOUNTER — Encounter: Payer: Self-pay | Admitting: Internal Medicine

## 2021-03-19 ENCOUNTER — Other Ambulatory Visit: Payer: Self-pay

## 2021-03-19 VITALS — BP 139/61 | HR 62 | Temp 96.5°F | Resp 19 | Ht 72.0 in | Wt 279.9 lb

## 2021-03-19 DIAGNOSIS — C3431 Malignant neoplasm of lower lobe, right bronchus or lung: Secondary | ICD-10-CM | POA: Diagnosis not present

## 2021-03-19 DIAGNOSIS — C3491 Malignant neoplasm of unspecified part of right bronchus or lung: Secondary | ICD-10-CM | POA: Diagnosis not present

## 2021-03-19 DIAGNOSIS — C3490 Malignant neoplasm of unspecified part of unspecified bronchus or lung: Secondary | ICD-10-CM | POA: Diagnosis not present

## 2021-03-19 DIAGNOSIS — C7972 Secondary malignant neoplasm of left adrenal gland: Secondary | ICD-10-CM | POA: Diagnosis not present

## 2021-03-19 DIAGNOSIS — C7971 Secondary malignant neoplasm of right adrenal gland: Secondary | ICD-10-CM | POA: Diagnosis not present

## 2021-03-19 NOTE — Progress Notes (Signed)
[facility] [phone]   [phone]  OFFICE PROGRESS NOTE  [name], MD [place] [address]  DIAGNOSIS: Metastatic non-small cell lung cancer, adenocarcinoma initially diagnosed as stage IB (T2a, N0, M0) non-small cell lung cancer, adenocarcinoma presented with right lower lobe lung mass.  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  Biomarker Findings Tumor Mutational Burden - 11 Muts/Mb Microsatellite status - MS-Stable Genomic Findings For a complete list of the genes assayed, please refer to the Appendix. AKT3 amplification STK11 P268f*48 MDM4 amplification - equivocal? IKBKE amplification - equivocal? PARK2 W770f8 PARP1 amplification - equivocal? PIK3C2B amplification - equivocal? SF3B1 K700E 8 Disease relevant genes with no reportable alterations: ALK, BRAF, EGFR, ERBB2, KRAS, MET, RET, ROS1  PDL 1 expression 0%.  PRIOR THERAPY: 1) Status post right middle and lower bilobectomies with lymph node dissection under the care of Dr. HeRoxan Hockeyn January 16, 2018. 2) status post SBRT to the metastatic lesion in the left adrenal gland in July 2020 under the care of Dr. KiSondra Come3) SBRT to the enlarging right adrenal gland mass under the care of Dr. KiSondra Comen February 2022.  CURRENT THERAPY: Observation.  INTERVAL HISTORY: Christian WILLERS75.o. male returns to the clinic today for follow-up visit.  The patient is feeling fine today with no concerning complaints except for fatigue and shortness of breath at baseline increased with exertion.  He underwent SBRT to the enlarging right adrenal gland under the care of Dr. KiSondra Comen February 2022.  The patient was supposed to have repeat CT scan of the chest, abdomen pelvis before this visit but unfortunately he did not show up for his scan.  He denied having any current chest pain, cough or hemoptysis.  He denied having any  nausea, vomiting, diarrhea or constipation.  He has no headache or visual changes.  He is here today for evaluation and repeat blood work.  MEDICAL HISTORY: Past Medical History:  Diagnosis Date  . Anxiety   . Arthritis   . Cancer ([facility]   skin  . Coronary artery disease   . Diabetes mellitus type 2 with complications ([facility]   not on medication was "taken off of list"; pt states that he was pre diabetic  . Dyspnea    walks 100 yards then rest  . GERD (gastroesophageal reflux disease)    ocassional no meds; pt states that its no longer an issue (01/14/18)  . History of shingles    2004  . Hyperlipidemia   . Hypertension   . lung ca dx'd 01/2018   right lung  . Metastasis to adrenal gland ([facility] 2020  . OSA on CPAP   . Pneumonia 10/2017    ALLERGIES:  is allergic to celebrex [celecoxib].  MEDICATIONS:  Current Outpatient Medications  Medication Sig Dispense Refill  . albuterol (VENTOLIN HFA) 108 (90 Base) MCG/ACT inhaler Inhale 1-2 puffs into the lungs every 6 (six) hours as needed for wheezing or shortness of breath. 1 each 2  . aspirin 81 MG EC tablet Take 162 mg by mouth daily.     . baclofen (LIORESAL) 20 MG tablet TAKE 1 TABLET (20 MG TOTAL) BY MOUTH 2 (TWO) TIMES DAILY AS NEEDED FOR MUSCLE SPASMS. 180 tablet 1  . clonazePAM (KLONOPIN) 0.5 MG tablet Take 1 tablet (0.5 mg total) by mouth at bedtime as needed. For insomnia 30 tablet 2  .  diphenhydrAMINE (BENADRYL) 25 mg capsule Take 25 mg by mouth at bedtime.     Marland Kitchen ketoconazole (NIZORAL) 2 % cream Apply 1 application topically 2 (two) times daily as needed for irritation.    Marland Kitchen lisinopril (ZESTRIL) 10 MG tablet Take 1 tablet (10 mg total) by mouth daily. 90 tablet 3  . Melatonin 10 MG TABS Take 10 mg by mouth at bedtime.     . metFORMIN (GLUCOPHAGE) 500 MG tablet Take 1 tablet (500 mg total) by mouth daily with breakfast. 90 tablet 3  . metoprolol succinate (TOPROL-XL) 50 MG 24 hr tablet Take 1 tablet (50 mg total) by mouth  daily. Take with or immediately following a meal. 90 tablet 3  . nitroGLYCERIN (NITROSTAT) 0.4 MG SL tablet Place 1 tablet (0.4 mg total) under the tongue every 5 (five) minutes as needed for chest pain. 25 tablet 1  . simvastatin (ZOCOR) 40 MG tablet TAKE 1 TABLET BY MOUTH EVERYDAY AT BEDTIME 90 tablet 3  . vitamin E 100 UNIT capsule Take 100 Units by mouth daily.     No current facility-administered medications for this visit.    SURGICAL HISTORY:  Past Surgical History:  Procedure Laterality Date  . CARDIAC CATHETERIZATION     2010 @ [facility] (per pt)  . COLONOSCOPY W/ POLYPECTOMY    . EYE SURGERY Bilateral    cataracts  . OPEN REDUCTION INTERNAL FIXATION (ORIF) DISTAL RADIAL FRACTURE Right 11/26/2013   Procedure: OPEN REDUCTION INTERNAL FIXATION (ORIF) DISTAL RADIAL FRACTURE;  Surgeon: Newt Minion, MD;  Location: [facility];  Service: Orthopedics;  Laterality: Right;  Open Reduction Internal Fixation Right Distal Radius   . VIDEO ASSISTED THORACOSCOPY (VATS)/ LOBECTOMY Right 01/16/2018   Procedure: VIDEO ASSISTED THORACOSCOPY (VATS)/RIGHT MIDDLE AND LOWER LOBE LUNG LOBECTOMY WITH NODE BIOPSIES x6;  Surgeon: Melrose Nakayama, MD;  Location: [facility];  Service: Thoracic;  Laterality: Right;  Marland Kitchen VIDEO BRONCHOSCOPY WITH ENDOBRONCHIAL NAVIGATION Right 12/15/2017   Procedure: VIDEO BRONCHOSCOPY WITH ENDOBRONCHIAL NAVIGATION;  Surgeon: Melrose Nakayama, MD;  Location: [facility];  Service: Thoracic;  Laterality: Right;  Marland Kitchen VIDEO BRONCHOSCOPY WITH ENDOBRONCHIAL ULTRASOUND N/A 12/15/2017   Procedure: VIDEO BRONCHOSCOPY WITH ENDOBRONCHIAL ULTRASOUND;  Surgeon: Melrose Nakayama, MD;  Location: [facility];  Service: Thoracic;  Laterality: N/A;    REVIEW OF SYSTEMS:  A comprehensive review of systems was negative except for: Constitutional: positive for fatigue Respiratory: positive for dyspnea on exertion Musculoskeletal: positive for muscle weakness   PHYSICAL EXAMINATION: General appearance:  alert, cooperative, fatigued and no distress Head: Normocephalic, without obvious abnormality, atraumatic Neck: no adenopathy, no JVD, supple, symmetrical, trachea midline and thyroid not enlarged, symmetric, no tenderness/mass/nodules Lymph nodes: Cervical, supraclavicular, and axillary nodes normal. Resp: clear to auscultation bilaterally Back: symmetric, no curvature. ROM normal. No CVA tenderness. Cardio: regular rate and rhythm, S1, S2 normal, no murmur, click, rub or gallop GI: soft, non-tender; bowel sounds normal; no masses,  no organomegaly Extremities: extremities normal, atraumatic, no cyanosis or edema  ECOG PERFORMANCE STATUS: 1 - Symptomatic but completely ambulatory  Blood pressure 139/61, pulse 62, temperature (!) 96.5 F (35.8 C), temperature source Tympanic, resp. rate 19, height 6' (1.829 m), weight 279 lb 14.4 oz (127 kg), SpO2 99 %.  LABORATORY DATA: Lab Results  Component Value Date   WBC 6.3 03/16/2021   HGB 11.2 (L) 03/16/2021   HCT 33.6 (L) 03/16/2021   MCV 104.7 (H) 03/16/2021   PLT 140 (L) 03/16/2021      Chemistry  Component Value Date/Time   NA 140 03/16/2021 1046   K 4.6 03/16/2021 1046   CL 109 03/16/2021 1046   CO2 20 (L) 03/16/2021 1046   BUN 18 03/16/2021 1046   CREATININE 1.40 (H) 03/16/2021 1046   CREATININE 0.99 07/11/2016 1448      Component Value Date/Time   CALCIUM 8.5 (L) 03/16/2021 1046   ALKPHOS 62 03/16/2021 1046   AST 18 03/16/2021 1046   ALT 17 03/16/2021 1046   BILITOT 0.6 03/16/2021 1046       RADIOGRAPHIC STUDIES: No results found.  ASSESSMENT AND PLAN: This is a very pleasant 75 years old white male with a stage IB non-small cell lung cancer, adenocarcinoma status post right middle and lower bilobectomies with lymph node dissection under the care of Dr. Roxan Hockey on January 16, 2018.   The patient is currently on observation and he is feeling fine with no concerning complaints. Recent CT scan of the chest,  abdomen and pelvis showed increase in the size of left adrenal mass suspicious for adrenal metastasis.  These findings were confirmed with the PET scan. The patient underwent CT-guided core biopsy of the left adrenal gland lesion and it was consistent with metastatic adenocarcinoma from likely lung primary. The patient underwent SBRT to the left adrenal gland lesion under the care of Dr. Sondra Come and he tolerated the procedure well. The patient also underwent SBRT to the right adrenal gland lesion under the care of Dr. Sondra Come in February 2022 and tolerated it well. He was supposed to have repeat CT scan of the chest, abdomen pelvis before this visit but unfortunately the patient did not show up for his scan. I recommended for him to have the repeat CT scan in the next few days.  If no concerning findings, I will see him back for follow-up visit in 6 months for evaluation with repeat CT of the chest, abdomen pelvis for restaging of his disease. The patient was advised to call immediately if he has any other concerning symptoms in the interval.  The patient voices understanding of current disease status and treatment options and is in agreement with the current care plan. All questions were answered. The patient knows to call the clinic with any problems, questions or concerns. We can certainly see the patient much sooner if necessary.  Disclaimer: This note was dictated with voice recognition software. Similar sounding words can inadvertently be transcribed and may not be corrected upon review.

## 2021-03-20 ENCOUNTER — Telehealth: Payer: Self-pay | Admitting: Internal Medicine

## 2021-03-20 NOTE — Telephone Encounter (Signed)
Scheduled per los. Called and left msg. Mailed printout  °

## 2021-03-27 ENCOUNTER — Other Ambulatory Visit: Payer: Self-pay

## 2021-03-27 ENCOUNTER — Ambulatory Visit (HOSPITAL_COMMUNITY)
Admission: RE | Admit: 2021-03-27 | Discharge: 2021-03-27 | Disposition: A | Discharge status: Home / Self Care | Payer: PPO | Source: Ambulatory Visit | Attending: Internal Medicine | Admitting: Internal Medicine

## 2021-03-27 DIAGNOSIS — C3490 Malignant neoplasm of unspecified part of unspecified bronchus or lung: Secondary | ICD-10-CM | POA: Diagnosis not present

## 2021-03-27 DIAGNOSIS — J438 Other emphysema: Secondary | ICD-10-CM | POA: Diagnosis not present

## 2021-03-27 DIAGNOSIS — I2510 Atherosclerotic heart disease of native coronary artery without angina pectoris: Secondary | ICD-10-CM | POA: Diagnosis not present

## 2021-03-27 DIAGNOSIS — N2889 Other specified disorders of kidney and ureter: Secondary | ICD-10-CM | POA: Diagnosis not present

## 2021-03-27 DIAGNOSIS — E278 Other specified disorders of adrenal gland: Secondary | ICD-10-CM | POA: Diagnosis not present

## 2021-03-27 DIAGNOSIS — N281 Cyst of kidney, acquired: Secondary | ICD-10-CM | POA: Diagnosis not present

## 2021-03-27 DIAGNOSIS — I714 Abdominal aortic aneurysm, without rupture: Secondary | ICD-10-CM | POA: Diagnosis not present

## 2021-03-30 ENCOUNTER — Telehealth: Payer: Self-pay

## 2021-03-30 NOTE — Telephone Encounter (Signed)
Pt called requesting his CT results.   I have attempted to call the pt back. There was no answer and no answering machine picked up. I was unable to leave a message.

## 2021-04-26 ENCOUNTER — Other Ambulatory Visit: Payer: Self-pay | Admitting: Family Medicine

## 2021-05-15 DIAGNOSIS — G4733 Obstructive sleep apnea (adult) (pediatric): Secondary | ICD-10-CM | POA: Diagnosis not present

## 2021-07-05 ENCOUNTER — Telehealth: Payer: Self-pay

## 2021-07-05 DIAGNOSIS — G4700 Insomnia, unspecified: Secondary | ICD-10-CM

## 2021-07-05 NOTE — Telephone Encounter (Signed)
Pt left v/m that he wanted a different inhaler that was not so expensive and requested refill on celebrex. Pt did not leave name of inhaler that was too expensive and I could not find celebrex on current or hx med list. I left v/m requesting pt to cb to see which inhaler pt was referring to as too expensive and also to verify pt was requesting celebrex and if so why pt takes the celebrex and who previously prescribed the celebrex. Sending note to [facility] and myself.

## 2021-07-06 NOTE — Telephone Encounter (Signed)
Left v/m requesting cb from pt for additional info listed in this note. Unable to speak with Christian [name] (DPR signed). Sending note to [facility].

## 2021-07-09 ENCOUNTER — Other Ambulatory Visit: Payer: Self-pay | Admitting: Family Medicine

## 2021-07-09 ENCOUNTER — Telehealth: Payer: Self-pay | Admitting: Family Medicine

## 2021-07-09 DIAGNOSIS — G4700 Insomnia, unspecified: Secondary | ICD-10-CM

## 2021-07-09 MED ORDER — CLONAZEPAM 0.5 MG PO TABS: 0.5000 mg | ORAL_TABLET | Freq: Every evening | ORAL | 1 refills | Status: DC | PRN

## 2021-07-09 NOTE — Addendum Note (Signed)
Addended by: [name] on: 07/09/2021 02:04 PM   Modules accepted: Orders

## 2021-07-09 NOTE — Telephone Encounter (Signed)
I don't see celebrex on the med list.   I sent rx for clonazepam.  Have him check with insurance about substitution for albuterol and let us know.   Thanks

## 2021-07-09 NOTE — Telephone Encounter (Signed)
LVM for pt to rtn my call to r/s appt with nha.

## 2021-07-09 NOTE — Telephone Encounter (Signed)
Pt called this morning and said the nurse that is with the insurance co advised pt that he could get a less expensive inhaler; presently pt is paying $40.00 for the albuterol inhaler. Pt said the nurse with ins. Neta Ehlers that PCP could get different inhaler that does not cost as much as the one pt is on at this time for the pt and that is what pt is asking for; pt was not told he would have to contact the ins co about what inhaler would be cheaper. Pt also said in his voice message last wk he was not asking for celebrex he was asking for clonazepam. Pt is not sure if still has refill on clonazepam or not and pt will ck with [facility] [address] to see if has refill. Pt request cb about a different inhaler. Sending note to [facility].

## 2021-07-10 NOTE — Telephone Encounter (Signed)
Notified patient rx was sent and advised patient to call insurance company about which inhaler they will cover. Patient voiced understanding and will let us know.

## 2021-07-11 ENCOUNTER — Emergency Department (HOSPITAL_COMMUNITY)
Admission: EM | Admit: 2021-07-11 | Discharge: 2021-07-11 | Disposition: A | Discharge status: Home / Self Care | Payer: PPO | Attending: Emergency Medicine | Admitting: Emergency Medicine

## 2021-07-11 ENCOUNTER — Telehealth: Payer: Self-pay | Admitting: *Deleted

## 2021-07-11 ENCOUNTER — Encounter (HOSPITAL_COMMUNITY): Payer: Self-pay

## 2021-07-11 ENCOUNTER — Emergency Department (HOSPITAL_COMMUNITY): Payer: PPO

## 2021-07-11 ENCOUNTER — Ambulatory Visit (HOSPITAL_COMMUNITY)
Admission: EM | Admit: 2021-07-11 | Discharge: 2021-07-11 | Disposition: A | Discharge status: Home / Self Care | Payer: PPO | Attending: Student | Admitting: Student

## 2021-07-11 ENCOUNTER — Other Ambulatory Visit: Payer: Self-pay

## 2021-07-11 ENCOUNTER — Ambulatory Visit (INDEPENDENT_AMBULATORY_CARE_PROVIDER_SITE_OTHER): Payer: PPO

## 2021-07-11 DIAGNOSIS — S3992XA Unspecified injury of lower back, initial encounter: Secondary | ICD-10-CM | POA: Diagnosis present

## 2021-07-11 DIAGNOSIS — M545 Low back pain, unspecified: Secondary | ICD-10-CM | POA: Insufficient documentation

## 2021-07-11 DIAGNOSIS — R062 Wheezing: Secondary | ICD-10-CM

## 2021-07-11 DIAGNOSIS — R0602 Shortness of breath: Secondary | ICD-10-CM

## 2021-07-11 DIAGNOSIS — E1151 Type 2 diabetes mellitus with diabetic peripheral angiopathy without gangrene: Secondary | ICD-10-CM | POA: Insufficient documentation

## 2021-07-11 DIAGNOSIS — Z902 Acquired absence of lung [part of]: Secondary | ICD-10-CM

## 2021-07-11 DIAGNOSIS — Z85118 Personal history of other malignant neoplasm of bronchus and lung: Secondary | ICD-10-CM | POA: Insufficient documentation

## 2021-07-11 DIAGNOSIS — Z923 Personal history of irradiation: Secondary | ICD-10-CM | POA: Diagnosis not present

## 2021-07-11 DIAGNOSIS — S22059A Unspecified fracture of T5-T6 vertebra, initial encounter for closed fracture: Secondary | ICD-10-CM | POA: Diagnosis not present

## 2021-07-11 DIAGNOSIS — J9811 Atelectasis: Secondary | ICD-10-CM | POA: Diagnosis not present

## 2021-07-11 DIAGNOSIS — Z85828 Personal history of other malignant neoplasm of skin: Secondary | ICD-10-CM | POA: Diagnosis not present

## 2021-07-11 DIAGNOSIS — Z79899 Other long term (current) drug therapy: Secondary | ICD-10-CM | POA: Insufficient documentation

## 2021-07-11 DIAGNOSIS — E785 Hyperlipidemia, unspecified: Secondary | ICD-10-CM | POA: Diagnosis not present

## 2021-07-11 DIAGNOSIS — S22050A Wedge compression fracture of T5-T6 vertebra, initial encounter for closed fracture: Secondary | ICD-10-CM | POA: Diagnosis not present

## 2021-07-11 DIAGNOSIS — W108XXA Fall (on) (from) other stairs and steps, initial encounter: Secondary | ICD-10-CM | POA: Diagnosis not present

## 2021-07-11 DIAGNOSIS — Z7982 Long term (current) use of aspirin: Secondary | ICD-10-CM | POA: Diagnosis not present

## 2021-07-11 DIAGNOSIS — E1169 Type 2 diabetes mellitus with other specified complication: Secondary | ICD-10-CM | POA: Diagnosis not present

## 2021-07-11 DIAGNOSIS — M546 Pain in thoracic spine: Secondary | ICD-10-CM | POA: Diagnosis not present

## 2021-07-11 DIAGNOSIS — I2510 Atherosclerotic heart disease of native coronary artery without angina pectoris: Secondary | ICD-10-CM | POA: Diagnosis not present

## 2021-07-11 DIAGNOSIS — R911 Solitary pulmonary nodule: Secondary | ICD-10-CM | POA: Diagnosis not present

## 2021-07-11 DIAGNOSIS — J90 Pleural effusion, not elsewhere classified: Secondary | ICD-10-CM

## 2021-07-11 DIAGNOSIS — Z87891 Personal history of nicotine dependence: Secondary | ICD-10-CM | POA: Diagnosis not present

## 2021-07-11 DIAGNOSIS — W19XXXA Unspecified fall, initial encounter: Secondary | ICD-10-CM

## 2021-07-11 DIAGNOSIS — M2578 Osteophyte, vertebrae: Secondary | ICD-10-CM | POA: Diagnosis not present

## 2021-07-11 DIAGNOSIS — S22080A Wedge compression fracture of T11-T12 vertebra, initial encounter for closed fracture: Secondary | ICD-10-CM | POA: Insufficient documentation

## 2021-07-11 DIAGNOSIS — M25572 Pain in left ankle and joints of left foot: Secondary | ICD-10-CM | POA: Diagnosis not present

## 2021-07-11 DIAGNOSIS — Z20822 Contact with and (suspected) exposure to covid-19: Secondary | ICD-10-CM | POA: Diagnosis not present

## 2021-07-11 DIAGNOSIS — Z85858 Personal history of malignant neoplasm of other endocrine glands: Secondary | ICD-10-CM | POA: Insufficient documentation

## 2021-07-11 DIAGNOSIS — R059 Cough, unspecified: Secondary | ICD-10-CM

## 2021-07-11 DIAGNOSIS — R5383 Other fatigue: Secondary | ICD-10-CM | POA: Insufficient documentation

## 2021-07-11 DIAGNOSIS — I700 Atherosclerosis of aorta: Secondary | ICD-10-CM | POA: Diagnosis not present

## 2021-07-11 LAB — TROPONIN I (HIGH SENSITIVITY)
Troponin I (High Sensitivity): 13 ng/L (ref <18)
Troponin I (High Sensitivity): 13 ng/L (ref <18)

## 2021-07-11 LAB — CBC
HCT: 37 % — ABNORMAL LOW (ref 39.0–52.0)
Hemoglobin: 12.2 g/dL — ABNORMAL LOW (ref 13.0–17.0)
MCH: 35.5 pg — ABNORMAL HIGH (ref 26.0–34.0)
MCHC: 33 g/dL (ref 30.0–36.0)
MCV: 107.6 fL — ABNORMAL HIGH (ref 80.0–100.0)
Platelets: 180 10*3/uL (ref 150–400)
RBC: 3.44 MIL/uL — ABNORMAL LOW (ref 4.22–5.81)
RDW: 14.8 % (ref 11.5–15.5)
WBC: 7.1 10*3/uL (ref 4.0–10.5)
nRBC: 0.7 % — ABNORMAL HIGH (ref 0.0–0.2)

## 2021-07-11 LAB — RESP PANEL BY RT-PCR (FLU A&B, COVID) ARPGX2
Influenza A by PCR: NEGATIVE
Influenza B by PCR: NEGATIVE
SARS Coronavirus 2 by RT PCR: NEGATIVE

## 2021-07-11 LAB — HEPATIC FUNCTION PANEL
ALT: 20 U/L (ref 0–44)
AST: 19 U/L (ref 15–41)
Albumin: 3.9 g/dL (ref 3.5–5.0)
Alkaline Phosphatase: 61 U/L (ref 38–126)
Bilirubin, Direct: <0.1 mg/dL (ref 0.0–0.2)
Total Bilirubin: 0.8 mg/dL (ref 0.3–1.2)
Total Protein: 7 g/dL (ref 6.5–8.1)

## 2021-07-11 LAB — BASIC METABOLIC PANEL
Anion gap: 7 (ref 5–15)
BUN: 15 mg/dL (ref 8–23)
CO2: 24 mmol/L (ref 22–32)
Calcium: 9.1 mg/dL (ref 8.9–10.3)
Chloride: 105 mmol/L (ref 98–111)
Creatinine, Ser: 1.2 mg/dL (ref 0.61–1.24)
GFR, Estimated: >60 mL/min (ref >60)
Glucose, Bld: 210 mg/dL — ABNORMAL HIGH (ref 70–99)
Potassium: 4.6 mmol/L (ref 3.5–5.1)
Sodium: 136 mmol/L (ref 135–145)

## 2021-07-11 LAB — BRAIN NATRIURETIC PEPTIDE: B Natriuretic Peptide: 60.4 pg/mL (ref 0.0–100.0)

## 2021-07-11 MED ORDER — ALBUTEROL SULFATE HFA 108 (90 BASE) MCG/ACT IN AERS: 1.0000 | INHALATION_SPRAY | Freq: Four times a day (QID) | RESPIRATORY_TRACT | 3 refills | Status: AC | PRN

## 2021-07-11 MED ORDER — IOHEXOL 350 MG/ML SOLN
80.0000 mL | Freq: Once | INTRAVENOUS | Status: AC | PRN
  Administered 2021-07-11: 80 mL via INTRAVENOUS

## 2021-07-11 NOTE — ED Notes (Addendum)
Patient is being discharged from the Urgent Care and sent to the Emergency Department via POV. Per L. Phillip Heal PA, patient is in need of higher level of care due to condition and Chest x- ray . Patient is aware and verbalizes understanding of plan of care.  Vitals:   07/11/21 1011  BP: (!) 175/78  Pulse: 66  Resp: (!) 28  Temp: 98 F (36.7 C)  SpO2: 96%

## 2021-07-11 NOTE — ED Notes (Signed)
Pt arriving to room at this time

## 2021-07-11 NOTE — Discharge Instructions (Addendum)
-  You have a pleural effusion of your lung.  This means there is some fluid on the lung that needs to be removed, as this is causing your shortness of breath.  This needs to be done in the emergency department setting.  Please head straight to the emergency department, if your symptoms get worse on the way like shortness of breath, weakness, dizziness, chest pain-stop and call 911 immediately.

## 2021-07-11 NOTE — ED Triage Notes (Signed)
T reports shortness of breath x 2 weeks. States this pulse ox at home was 92%. Pt reports he have 1 lung. Pt denies chest pain, dizziness, headache.

## 2021-07-11 NOTE — ED Triage Notes (Signed)
Patient sent to ED from Urgent Care for further evaluation of shortness of breath that started several days ago. Room air SpO2 96%, no tachypnea, is speaking in complete sentences.Patient alert, oriented, and in no apparent distress at this time.

## 2021-07-11 NOTE — ED Provider Notes (Signed)
[facility]    CSN: [id number] Arrival date & time: 07/11/21  1006      History   Chief Complaint Chief Complaint  Patient presents with   Shortness of Breath    HPI Christian [name] is a 75 y.o. male presenting with shortness of breath x2-3 weeks.  This patient has a history of metastatic non-small cell lung cancer, followed by pulmonology for this.  He had a right lung middle and lower lobectomy 2019.   Cancer is currently stable- patient due for repeat CT 09/2021. Has not had covid test for current symptoms. Denies cough. Denies CP, dizziness, weakness, fevers/chills. Oxygenating on room air at 92% at home. Albuterol providing some relief. Symptoms getting worse.  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   OSA on CPAP    Pneumonia 10/2017    Patient Active Problem List   Diagnosis Date Noted   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, left ([place]) 06/16/2019   Metastasis to adrenal gland ([place]) 06/01/2019   Benign mass of left adrenal gland ([place]) 02/15/2019   Aorta disorder ([place]) 11/08/2018   Adenocarcinoma of right lung, stage 1 ([place]) 08/18/2018   S/P lobectomy of lung 01/16/2018   Thoracic aortic aneurysm ([place]) 11/12/2017   Health care maintenance 08/06/2017   Insomnia 08/06/2017   PVD (peripheral vascular disease) ([place]) 08/06/2017   Advance care planning 01/29/2017   OSA on  CPAP 01/29/2017   Rash 01/29/2017   Diabetes mellitus type 2 with complications ([place])    Chest pain 05/05/2016   LV dysfunction 12/13/2015   CAD (coronary artery disease) 12/12/2015   History of cardiac cath 12/12/2015   Obstructive chronic bronchitis without exacerbation ([place]) 07/28/2014   Anxiety state 07/28/2014   Essential hypertension, benign 07/28/2014   Hyperlipidemia 07/28/2014   Chronic low back pain 07/28/2014    Past Surgical History:  Procedure Laterality Date   CARDIAC CATHETERIZATION     2010 @ [facility] (per pt)   COLONOSCOPY W/ POLYPECTOMY     EYE SURGERY Bilateral    cataracts   OPEN REDUCTION INTERNAL FIXATION (ORIF) DISTAL RADIAL FRACTURE Right 11/26/2013   Procedure: OPEN REDUCTION INTERNAL FIXATION (ORIF) DISTAL RADIAL FRACTURE;  Surgeon: Newt Minion, MD;  Location: [place];  Service: Orthopedics;  Laterality: Right;  Open Reduction Internal Fixation Right Distal Radius    VIDEO ASSISTED THORACOSCOPY (VATS)/ LOBECTOMY Right 01/16/2018   Procedure: VIDEO ASSISTED THORACOSCOPY (VATS)/RIGHT MIDDLE AND LOWER LOBE LUNG LOBECTOMY WITH NODE BIOPSIES x6;  Surgeon: Melrose Nakayama, MD;  Location: [place];  Service: Thoracic;  Laterality: Right;   VIDEO BRONCHOSCOPY WITH ENDOBRONCHIAL NAVIGATION Right 12/15/2017   Procedure: VIDEO BRONCHOSCOPY WITH ENDOBRONCHIAL NAVIGATION;  Surgeon: Melrose Nakayama, MD;  [facility];  Service: Thoracic;  Laterality: Right;   VIDEO BRONCHOSCOPY WITH ENDOBRONCHIAL ULTRASOUND N/A 12/15/2017   Procedure: VIDEO BRONCHOSCOPY WITH ENDOBRONCHIAL ULTRASOUND;  Surgeon: Melrose Nakayama, MD;  Location: [facility];  Service: Thoracic;  Laterality: N/A;       Home Medications    Prior to Admission medications   Medication Sig Start Date End Date Taking? Authorizing Provider  albuterol (VENTOLIN HFA) 108 (90 Base) MCG/ACT inhaler Inhale 1-2 puffs into the lungs every 6 (six) hours as needed for wheezing or shortness of breath.  10/30/20   [name], MD  aspirin 81 MG EC tablet Take 162 mg by mouth daily.     [provider]  baclofen (LIORESAL) 20 MG tablet TAKE 1 TABLET (20 MG TOTAL) BY MOUTH 2 (TWO) TIMES DAILY AS NEEDED FOR MUSCLE SPASMS. 04/26/21   [name], MD  clonazePAM (KLONOPIN) 0.5 MG tablet Take 1 tablet (0.5 mg total) by mouth at bedtime as needed. For insomnia 07/09/21   [name], MD  diphenhydrAMINE (BENADRYL) 25 mg capsule Take 25 mg by mouth at bedtime.     [provider]  ketoconazole (NIZORAL) 2 % cream Apply 1 application topically 2 (two) times daily as needed for irritation. 01/21/18   Nani Skillern, PA-C  lisinopril (ZESTRIL) 10 MG tablet Take 1 tablet (10 mg total) by mouth daily. 10/30/20   [name], MD  Melatonin 10 MG TABS Take 10 mg by mouth at bedtime.     [provider]  metFORMIN (GLUCOPHAGE) 500 MG tablet Take 1 tablet (500 mg total) by mouth daily with breakfast. 10/30/20   [name], MD  metoprolol succinate (TOPROL-XL) 50 MG 24 hr tablet Take 1 tablet (50 mg total) by mouth daily. Take with or immediately following a meal. 10/30/20   [name], MD  nitroGLYCERIN (NITROSTAT) 0.4 MG SL tablet Place 1 tablet (0.4 mg total) under the tongue every 5 (five) minutes as needed for chest pain. 05/07/16   Arbutus Leas, NP  simvastatin (ZOCOR) 40 MG tablet TAKE 1 TABLET BY MOUTH EVERYDAY AT BEDTIME 10/30/20   [name], MD  vitamin E 100 UNIT capsule Take 100 Units by mouth daily.    [provider]    Family History Family History  Problem Relation Age of Onset   Heart disease Mother    Heart disease Father    Colon cancer Father        possible colon or prostate cancer, dx'd in his 65s, patient wasn't sure of source.    Prostate cancer Father        possible colon or prostate cancer, dx'd in his 66s, patient wasn't sure of source.    Heart disease Brother    Diabetes Brother    Kidney disease  Brother    Heart disease Sister    Cancer Sister     Social History Social History   Tobacco Use   Smoking status: Former    Packs/day: 1.00    Years: 51.00    Pack years: 51.00    Types: Cigarettes, E-cigarettes    Quit date: 01/15/2018    Years since quitting: 3.4   Smokeless tobacco: Never   Tobacco comments:    pt "considering" stopping  Vaping Use   Vaping Use: Every day  Substance Use Topics   Alcohol use: Yes    Alcohol/week: 7.0 standard drinks    Types: 7 Glasses of wine per week  Comment: 1 glass of wine per night-    Drug use: No     Allergies   Celebrex [celecoxib]   Review of Systems Review of Systems  Respiratory:  Positive for shortness of breath.     Physical Exam Triage Vital Signs ED Triage Vitals  Enc Vitals Group     BP 07/11/21 1011 (!) 175/78     Pulse Rate 07/11/21 1011 66     Resp 07/11/21 1011 (!) 28     Temp 07/11/21 1011 98 F (36.7 C)     Temp Source 07/11/21 1011 Oral     SpO2 07/11/21 1011 96 %     Weight --      Height --      Head Circumference --      Peak Flow --      Pain Score 07/11/21 1014 0     Pain Loc --      Pain Edu? --      Excl. in [place]? --    No data found.  Updated Vital Signs BP (!) 175/78   Pulse 66   Temp 98 F (36.7 C) (Oral)   Resp (!) 28   SpO2 96%   Visual Acuity Right Eye Distance:   Left Eye Distance:   Bilateral Distance:    Right Eye Near:   Left Eye Near:    Bilateral Near:     Physical Exam Vitals reviewed.  Constitutional:      General: He is not in acute distress.    Appearance: Normal appearance. He is not ill-appearing.  HENT:     Head: Normocephalic and atraumatic.     Right Ear: Tympanic membrane, ear canal and external ear normal. No tenderness. No middle ear effusion. There is no impacted cerumen. Tympanic membrane is not perforated, erythematous, retracted or bulging.     Left Ear: Tympanic membrane, ear canal and external ear normal. No tenderness.  No middle ear  effusion. There is no impacted cerumen. Tympanic membrane is not perforated, erythematous, retracted or bulging.     Nose: Nose normal. No congestion.     Mouth/Throat:     Mouth: Mucous membranes are moist.     Pharynx: Uvula midline. No oropharyngeal exudate or posterior oropharyngeal erythema.  Eyes:     Extraocular Movements: Extraocular movements intact.     Pupils: Pupils are equal, round, and reactive to light.  Cardiovascular:     Rate and Rhythm: Normal rate and regular rhythm.     Heart sounds: Normal heart sounds.  Pulmonary:     Effort: Pulmonary effort is normal. Tachypnea present.     Breath sounds: Examination of the right-upper field reveals decreased breath sounds. Examination of the right-middle field reveals decreased breath sounds. Examination of the right-lower field reveals decreased breath sounds. Examination of the left-lower field reveals rhonchi. Decreased breath sounds and rhonchi present. No wheezing or rales.     Comments: (R lung absent) Rhonchi lower L lung fields Abdominal:     Palpations: Abdomen is soft.     Tenderness: There is no abdominal tenderness. There is no guarding or rebound.  Neurological:     General: No focal deficit present.     Mental Status: He is alert and oriented to person, place, and time.  Psychiatric:        Mood and Affect: Mood normal.        Behavior: Behavior normal.        Thought Content: Thought content normal.  Judgment: Judgment normal.     UC Treatments / Results  Labs (all labs ordered are listed, but only abnormal results are displayed) Labs Reviewed - No data to display  EKG   Radiology DG Chest 2 View  Result Date: 07/11/2021 CLINICAL DATA:  Shortness of breath EXAM: CHEST - 2 VIEW COMPARISON:  02/03/2018 FINDINGS: Small right pleural effusion. Right base atelectasis. Left lung clear. Heart is normal size. No acute bony abnormality. IMPRESSION: Small right pleural effusion with right base atelectasis.  Electronically Signed   By: Rolm Baptise M.D.   On: 07/11/2021 10:46    Procedures Procedures (including critical care time)  Medications Ordered in UC Medications - No data to display  Initial Impression / Assessment and Plan / UC Course  I have reviewed the triage vital signs and the nursing notes.  Pertinent labs & imaging results that were available during my care of the patient were reviewed by me and considered in my medical decision making (see chart for details).     This patient is a very pleasant 75 y.o. year old male presenting with pleural effusion. This patient has history of non-small cell lung cancer an R lobectomy 2019. Currently followed by oncology every 6 months. Today oxygenating well on room air but is tachypneic. Albuterol inhaler providing some relief. He is not on home oxygen.   CXR - Small right pleural effusion with right base atelectasis.  I am recommending this patient head to the ED for further evaluation and management of pleural effusion. He is in agreement. Declines transport via EMS in favor of personal vehicle.  Final Clinical Impressions(s) / UC Diagnoses   Final diagnoses:  Pleural effusion  History of lobectomy of lung  History of primary non-small cell carcinoma of right lung  Shortness of breath     Discharge Instructions      -You have a pleural effusion of your lung.  This means there is some fluid on the lung that needs to be removed, as this is causing your shortness of breath.  This needs to be done in the emergency department setting.  Please head straight to the emergency department, if your symptoms get worse on the way like shortness of breath, weakness, dizziness, chest pain-stop and call 911 immediately.     ED Prescriptions   None    PDMP not reviewed this encounter.   Hazel Sams, PA-C 07/11/21 1116

## 2021-07-11 NOTE — Telephone Encounter (Signed)
Sent. Thanks.  Will await ER note- per EMR in ER currently.

## 2021-07-11 NOTE — Telephone Encounter (Signed)
Patient called stating that he spoke to Dr. Josefine Class assistant yesterday and requested that his inhaler be sent in even if his insurance will not pay for it. Patient stated that he has been having SOB for several weeks and is having to grasp for air at times. Patient stated that he does not have covid and does not have a cough. Patient was advised that he has coughed several times while on the phone with me. Patient stated that his oxygen level has dropped to 92-93% and has been as low as 90%. Patient was advised that he should have a face to face evaluation so that someone can listen to his lungs. Patient was given information on the [facility] at [facility] because they have x-ray equipment in case he needs a chest x-ray. Patient stated that he only has one lung. Patient stated that he will head over to the UC shortly.  Patient stated that he would still like Dr. Damita Dunnings to send in the refill on his inhaler.

## 2021-07-11 NOTE — ED Provider Notes (Signed)
[facility] DEPART[facility] Provider Note   CSN: [id number] Arrival date & time: 07/11/21  1138     History Chief Complaint  Patient presents with   Shortness of Breath    Christian [name] is a 75 y.o. male.  HPI Patient is a 75 year old male with past medical history significant for metastatic non-small cell lung cancer followed by pulmonology and oncology for this.  He has had right middle and lower lobe lobectomy in 2019 patient continues to have radiation therapy for this.  Is being monitored by CT imaging.  Patient states over the past 2 or 3 weeks he has had gradual worsening of his chronic dyspnea.  He states that he has been short of breath since 2018 when he had his lobectomy.  He states that he does feel that his shortness of breath is more significant than prior.  He is getting more short of breath over short distances with exertion.  He denies any chest pain nausea vomiting diarrhea lightheadedness dizziness denies any hemoptysis is not on any hormonal medications.  Denies any syncope or near syncope. No lower extremity swelling.  He states that he is having some mild left ankle pain after he tumbled down some stairs 1 month ago.  States he did not hit his head or pass out.  States he is having some low back pain as result of his fall as well.      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   OSA on CPAP    Pneumonia 10/2017    Patient Active  Problem List   Diagnosis Date Noted   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, left ([place]) 06/16/2019   Metastasis to adrenal gland ([place]) 06/01/2019   Benign mass of left adrenal gland ([place]) 02/15/2019   Aorta disorder ([place]) 11/08/2018   Adenocarcinoma of right lung, stage 1 ([place]) 08/18/2018   S/P lobectomy of lung 01/16/2018   Thoracic aortic aneurysm ([place]) 11/12/2017   Health care maintenance 08/06/2017   Insomnia 08/06/2017   PVD (peripheral vascular disease) ([place]) 08/06/2017   Advance care planning 01/29/2017   OSA on CPAP 01/29/2017   Rash 01/29/2017   Diabetes mellitus type 2 with complications ([place])    Chest pain 05/05/2016   LV dysfunction 12/13/2015   CAD (coronary artery disease) 12/12/2015   History of cardiac cath 12/12/2015   Obstructive chronic bronchitis without exacerbation ([place]) 07/28/2014   Anxiety state 07/28/2014   Essential hypertension, benign 07/28/2014   Hyperlipidemia 07/28/2014   Chronic low back pain 07/28/2014    Past Surgical History:  Procedure Laterality Date   CARDIAC CATHETERIZATION     2010 @ [facility] (per pt)   COLONOSCOPY W/ POLYPECTOMY     EYE SURGERY  Bilateral    cataracts   OPEN REDUCTION INTERNAL FIXATION (ORIF) DISTAL RADIAL FRACTURE Right 11/26/2013   Procedure: OPEN REDUCTION INTERNAL FIXATION (ORIF) DISTAL RADIAL FRACTURE;  Surgeon: Newt Minion, MD;  Location: [facility];  Service: Orthopedics;  Laterality: Right;  Open Reduction Internal Fixation Right Distal Radius    VIDEO ASSISTED THORACOSCOPY (VATS)/ LOBECTOMY Right 01/16/2018   Procedure: VIDEO ASSISTED THORACOSCOPY (VATS)/RIGHT MIDDLE AND LOWER LOBE LUNG LOBECTOMY WITH NODE BIOPSIES x6;  Surgeon: Melrose Nakayama, MD;  Location: [facility];  Service: Thoracic;  Laterality: Right;   VIDEO BRONCHOSCOPY WITH ENDOBRONCHIAL NAVIGATION Right 12/15/2017   Procedure: VIDEO BRONCHOSCOPY WITH ENDOBRONCHIAL  NAVIGATION;  Surgeon: Melrose Nakayama, MD;  Location: [facility];  Service: Thoracic;  Laterality: Right;   VIDEO BRONCHOSCOPY WITH ENDOBRONCHIAL ULTRASOUND N/A 12/15/2017   Procedure: VIDEO BRONCHOSCOPY WITH ENDOBRONCHIAL ULTRASOUND;  Surgeon: Melrose Nakayama, MD;  Location: [facility];  Service: Thoracic;  Laterality: N/A;       Family History  Problem Relation Age of Onset   Heart disease Mother    Heart disease Father    Colon cancer Father        possible colon or prostate cancer, dx'd in his 30s, patient wasn't sure of source.    Prostate cancer Father        possible colon or prostate cancer, dx'd in his 56s, patient wasn't sure of source.    Heart disease Brother    Diabetes Brother    Kidney disease Brother    Heart disease Sister    Cancer Sister     Social History   Tobacco Use   Smoking status: Former    Packs/day: 1.00    Years: 51.00    Pack years: 51.00    Types: Cigarettes, E-cigarettes    Quit date: 01/15/2018    Years since quitting: 3.5   Smokeless tobacco: Never   Tobacco comments:    pt "considering" stopping  Vaping Use   Vaping Use: Every day  Substance Use Topics   Alcohol use: Yes    Alcohol/week: 7.0 standard drinks    Types: 7 Glasses of wine per week    Comment: 1 glass of wine per night-    Drug use: No    Home Medications Prior to Admission medications   Medication Sig Start Date End Date Taking? Authorizing Provider  albuterol (VENTOLIN HFA) 108 (90 Base) MCG/ACT inhaler Inhale 1-2 puffs into the lungs every 6 (six) hours as needed for wheezing or shortness of breath. 07/11/21  Yes [name], MD  aspirin 81 MG EC tablet Take 162 mg by mouth daily.    Yes [provider]  baclofen (LIORESAL) 20 MG tablet TAKE 1 TABLET (20 MG TOTAL) BY MOUTH 2 (TWO) TIMES DAILY AS NEEDED FOR MUSCLE SPASMS. 04/26/21  Yes [name], MD  clonazePAM (KLONOPIN) 0.5 MG tablet Take 1 tablet (0.5 mg total) by mouth at bedtime as needed. For  insomnia 07/09/21  Yes [name], MD  diphenhydrAMINE (BENADRYL) 25 mg capsule Take 25 mg by mouth at bedtime.    Yes [provider]  lisinopril (ZESTRIL) 10 MG tablet Take 1 tablet (10 mg total) by mouth daily. 10/30/20  Yes [name], MD  Melatonin 10 MG TABS Take 10 mg by mouth at bedtime.    Yes [provider]  metFORMIN (GLUCOPHAGE) 500 MG tablet Take 1 tablet (500 mg total) by mouth daily with breakfast. Patient taking differently: Take 500 mg  by mouth at bedtime. 10/30/20  Yes [name], MD  metoprolol succinate (TOPROL-XL) 50 MG 24 hr tablet Take 1 tablet (50 mg total) by mouth daily. Take with or immediately following a meal. 10/30/20  Yes [name], MD  nitroGLYCERIN (NITROSTAT) 0.4 MG SL tablet Place 1 tablet (0.4 mg total) under the tongue every 5 (five) minutes as needed for chest pain. 05/07/16  Yes Arbutus Leas, NP  simvastatin (ZOCOR) 40 MG tablet TAKE 1 TABLET BY MOUTH EVERYDAY AT BEDTIME Patient taking differently: Take 40 mg by mouth at bedtime. 10/30/20  Yes [name], MD  vitamin E 100 UNIT capsule Take 100 Units by mouth daily.   Yes [provider]  ketoconazole (NIZORAL) 2 % cream Apply 1 application topically 2 (two) times daily as needed for irritation. Patient not taking: Reported on 07/11/2021 01/21/18   Nani Skillern, PA-C    Allergies    Celebrex [celecoxib]  Review of Systems   Review of Systems  Constitutional:  Negative for chills and fever.  HENT:  Negative for congestion.   Eyes:  Negative for pain.  Respiratory:  Positive for shortness of breath. Negative for cough.   Cardiovascular:  Negative for chest pain and leg swelling.  Gastrointestinal:  Negative for abdominal pain and vomiting.  Genitourinary:  Negative for dysuria.  Musculoskeletal:  Negative for myalgias.  Skin:  Negative for rash.  Neurological:  Negative for dizziness and headaches.   Physical Exam Updated Vital  Signs BP (!) 145/74   Pulse 97   Temp 98.2 F (36.8 C) (Oral)   Resp 16   SpO2 98%   Physical Exam Vitals and nursing note reviewed.  Constitutional:      General: He is not in acute distress. HENT:     Head: Normocephalic and atraumatic.     Nose: Nose normal.  Eyes:     General: No scleral icterus. Cardiovascular:     Rate and Rhythm: Normal rate and regular rhythm.     Pulses: Normal pulses.     Heart sounds: Normal heart sounds.  Pulmonary:     Effort: Pulmonary effort is normal. No respiratory distress.     Breath sounds: No wheezing.  Abdominal:     Palpations: Abdomen is soft.     Tenderness: There is no abdominal tenderness.  Musculoskeletal:     Cervical back: Normal range of motion.     Right lower leg: No edema.     Left lower leg: No edema.     Comments: Midline thoracic tenderness palpation  Skin:    General: Skin is warm and dry.     Capillary Refill: Capillary refill takes less than 2 seconds.  Neurological:     Mental Status: He is alert. Mental status is at baseline.  Psychiatric:        Mood and Affect: Mood normal.        Behavior: Behavior normal.    ED Results / Procedures / Treatments   Labs (all labs ordered are listed, but only abnormal results are displayed) Labs Reviewed  BASIC METABOLIC PANEL - Abnormal; Notable for the following components:      Result Value   Glucose, Bld 210 (*)    All other components within normal limits  CBC - Abnormal; Notable for the following components:   RBC 3.44 (*)    Hemoglobin 12.2 (*)    HCT 37.0 (*)    MCV 107.6 (*)    MCH 35.5 (*)  nRBC 0.7 (*)    All other components within normal limits  RESP PANEL BY RT-PCR (FLU A&B, COVID) ARPGX2  BRAIN NATRIURETIC PEPTIDE  HEPATIC FUNCTION PANEL  TROPONIN I (HIGH SENSITIVITY)  TROPONIN I (HIGH SENSITIVITY)    EKG EKG Interpretation  Date/Time:  Wednesday July 11 2021 11:40:45 EDT Ventricular Rate:  67 PR Interval:  206 QRS Duration: 114 QT  Interval:  402 QTC Calculation: 424 R Axis:   -37 Text Interpretation: Sinus rhythm with Fusion complexes Left axis deviation Minimal voltage criteria for LVH, may be normal variant ( R in aVL ) Nonspecific ST abnormality Abnormal ECG Confirmed by Orpah Greek [phone]) on 07/12/2021 11:47:35 AM  Radiology No results found.  Procedures Procedures   Medications Ordered in ED Medications  iohexol (OMNIPAQUE) 350 MG/ML injection 80 mL (80 mLs Intravenous Contrast Given 07/11/21 1625)    ED Course  I have reviewed the triage vital signs and the nursing notes.  Pertinent labs & imaging results that were available during my care of the patient were reviewed by me and considered in my medical decision making (see chart for details).  Clinical Course as of 07/16/21 0635  Wed Jul 11, 2021  1303 Hx of lobectomy.   SOB 2 weeks ago (worse than normal).  CP - none.  NVD - none.    [WF]  1308 FINDINGS: Small right pleural effusion. Right base atelectasis. Left lung clear. Heart is normal size. No acute bony abnormality.   IMPRESSION: Small right pleural effusion with right base atelectasis.   [WF]  1308 No recent surgeries, hospitalization, long travel, hemoptysis, estrogen containing OCP, DOES HAVE CANCER history.  No unilateral leg swelling.  No history of PE or VTE.  [WF]    Clinical Course User Index [WF] Tedd Sias, [place]   MDM Rules/Calculators/A&P                          Patient is a 75 year old male with a past medical history detailed in HPI notably has a history of metastatic lung cancer he is complaining today of worsening shortness of breath seems to be worse with exertion also seems to be more profound at rest.  Denies any hemoptysis.  Patient's physical exam is relatively unremarkable he does have some midline thoracic tenderness to palpation and did have a fall recently.  It was however several weeks back.  Given patient's symptoms will obtain CT PE study.   This was negative for PE however did show evidence of metastatic disease which is changed from prior CT scan done previously several months back.  COVID influenza negative.  Delta troponin is unremarkable.  LFTs unremarkable.  CBC with mild anemia no leukocytosis.  BMP unremarkable apart from mild hyperglycemia.  Chest x-ray also reviewed.  EKG without any acute changes.  Christian [name] was evaluated in Emergency Department on 07/16/2021 for the symptoms described in the history of present illness. He was evaluated in the context of the global COVID-19 pandemic, which necessitated consideration that the patient might be at risk for infection with the SARS-CoV-2 virus that causes COVID-19. Institutional protocols and algorithms that pertain to the evaluation of patients at risk for COVID-19 are in a state of rapid change based on information released by regulatory bodies including the CDC and federal and state organizations. These policies and algorithms were followed during the patient's care in the ED.   Final Clinical Impression(s) / ED Diagnoses Final diagnoses:  Fall  Thoracic back pain  Compression fracture of T5 vertebra, initial encounter (HCC)  SOB (shortness of breath)  Fatigue, unspecified type    Rx / DC Orders ED Discharge Orders     None        Tedd Sias, [place] 07/16/21 3578    Godfrey Pick, MD 07/16/21 0800

## 2021-07-11 NOTE — Discharge Instructions (Addendum)
Please follow-up with your pulmonology/oncology team.  I have also given you the information for a neurosurgeon given that you have a vertebral compression fracture.  These generally do not require surgery however therefore you may follow-up with your primary care provider first.  You may apply Voltaren gel to your back.  Take Tylenol 1000 mg every 6 hours.  Please return to the ER for any new or concerning symptoms. Your CT scan today did show very concerning evidence for metastatic malignancy As we discussed

## 2021-07-11 NOTE — ED Notes (Signed)
Patient transported to CT 

## 2021-07-24 ENCOUNTER — Ambulatory Visit: Payer: PPO

## 2021-09-05 ENCOUNTER — Ambulatory Visit: Payer: PPO

## 2021-09-14 ENCOUNTER — Other Ambulatory Visit: Payer: PPO

## 2021-09-17 ENCOUNTER — Ambulatory Visit (HOSPITAL_COMMUNITY): Payer: PPO

## 2021-09-17 ENCOUNTER — Telehealth: Payer: Self-pay | Admitting: Family Medicine

## 2021-09-17 ENCOUNTER — Other Ambulatory Visit: Payer: PPO

## 2021-09-17 NOTE — Telephone Encounter (Signed)
LVM for pt to rtn my call to r/s appt with [facility] on 09/25/21

## 2021-09-18 ENCOUNTER — Ambulatory Visit: Payer: PPO | Admitting: Internal Medicine

## 2021-09-24 ENCOUNTER — Other Ambulatory Visit: Payer: Self-pay

## 2021-09-24 ENCOUNTER — Inpatient Hospital Stay: Payer: PPO | Attending: Internal Medicine

## 2021-09-24 ENCOUNTER — Ambulatory Visit (HOSPITAL_COMMUNITY)
Admission: RE | Admit: 2021-09-24 | Discharge: 2021-09-24 | Disposition: A | Discharge status: Home / Self Care | Payer: PPO | Source: Ambulatory Visit | Attending: Internal Medicine | Admitting: Internal Medicine

## 2021-09-24 DIAGNOSIS — N2889 Other specified disorders of kidney and ureter: Secondary | ICD-10-CM | POA: Diagnosis not present

## 2021-09-24 DIAGNOSIS — K769 Liver disease, unspecified: Secondary | ICD-10-CM | POA: Diagnosis not present

## 2021-09-24 DIAGNOSIS — C3490 Malignant neoplasm of unspecified part of unspecified bronchus or lung: Secondary | ICD-10-CM | POA: Insufficient documentation

## 2021-09-24 DIAGNOSIS — N281 Cyst of kidney, acquired: Secondary | ICD-10-CM | POA: Diagnosis not present

## 2021-09-24 DIAGNOSIS — N400 Enlarged prostate without lower urinary tract symptoms: Secondary | ICD-10-CM | POA: Diagnosis not present

## 2021-09-24 DIAGNOSIS — J439 Emphysema, unspecified: Secondary | ICD-10-CM | POA: Diagnosis not present

## 2021-09-24 DIAGNOSIS — C3431 Malignant neoplasm of lower lobe, right bronchus or lung: Secondary | ICD-10-CM | POA: Insufficient documentation

## 2021-09-24 DIAGNOSIS — C342 Malignant neoplasm of middle lobe, bronchus or lung: Secondary | ICD-10-CM | POA: Insufficient documentation

## 2021-09-24 DIAGNOSIS — I700 Atherosclerosis of aorta: Secondary | ICD-10-CM | POA: Diagnosis not present

## 2021-09-24 DIAGNOSIS — R911 Solitary pulmonary nodule: Secondary | ICD-10-CM | POA: Diagnosis not present

## 2021-09-24 DIAGNOSIS — C7932 Secondary malignant neoplasm of cerebral meninges: Secondary | ICD-10-CM | POA: Insufficient documentation

## 2021-09-24 LAB — CBC WITH DIFFERENTIAL (CANCER CENTER ONLY)
Abs Immature Granulocytes: 0.02 10*3/uL (ref 0.00–0.07)
Basophils Absolute: 0.1 10*3/uL (ref 0.0–0.1)
Basophils Relative: 1 %
Eosinophils Absolute: 0.4 10*3/uL (ref 0.0–0.5)
Eosinophils Relative: 5 %
HCT: 33.7 % — ABNORMAL LOW (ref 39.0–52.0)
Hemoglobin: 11.6 g/dL — ABNORMAL LOW (ref 13.0–17.0)
Immature Granulocytes: 0 %
Lymphocytes Relative: 14 %
Lymphs Abs: 1.1 10*3/uL (ref 0.7–4.0)
MCH: 35.6 pg — ABNORMAL HIGH (ref 26.0–34.0)
MCHC: 34.4 g/dL (ref 30.0–36.0)
MCV: 103.4 fL — ABNORMAL HIGH (ref 80.0–100.0)
Monocytes Absolute: 0.8 10*3/uL (ref 0.1–1.0)
Monocytes Relative: 10 %
Neutro Abs: 5.6 10*3/uL (ref 1.7–7.7)
Neutrophils Relative %: 70 %
Platelet Count: 159 10*3/uL (ref 150–400)
RBC: 3.26 MIL/uL — ABNORMAL LOW (ref 4.22–5.81)
RDW: 13.9 % (ref 11.5–15.5)
WBC Count: 8 10*3/uL (ref 4.0–10.5)
nRBC: 0.4 % — ABNORMAL HIGH (ref 0.0–0.2)

## 2021-09-24 LAB — CMP (CANCER CENTER ONLY)
ALT: 14 U/L (ref 0–44)
AST: 12 U/L — ABNORMAL LOW (ref 15–41)
Albumin: 3.9 g/dL (ref 3.5–5.0)
Alkaline Phosphatase: 79 U/L (ref 38–126)
Anion gap: 10 (ref 5–15)
BUN: 22 mg/dL (ref 8–23)
CO2: 25 mmol/L (ref 22–32)
Calcium: 9.6 mg/dL (ref 8.9–10.3)
Chloride: 105 mmol/L (ref 98–111)
Creatinine: 1.42 mg/dL — ABNORMAL HIGH (ref 0.61–1.24)
GFR, Estimated: 52 mL/min — ABNORMAL LOW (ref >60)
Glucose, Bld: 232 mg/dL — ABNORMAL HIGH (ref 70–99)
Potassium: 5 mmol/L (ref 3.5–5.1)
Sodium: 140 mmol/L (ref 135–145)
Total Bilirubin: 0.6 mg/dL (ref 0.3–1.2)
Total Protein: 7 g/dL (ref 6.5–8.1)

## 2021-09-25 ENCOUNTER — Ambulatory Visit: Payer: PPO

## 2021-09-27 ENCOUNTER — Other Ambulatory Visit: Payer: Self-pay

## 2021-09-27 ENCOUNTER — Encounter: Payer: Self-pay | Admitting: Internal Medicine

## 2021-09-27 ENCOUNTER — Other Ambulatory Visit: Payer: PPO

## 2021-09-27 ENCOUNTER — Telehealth: Payer: Self-pay | Admitting: Radiation Oncology

## 2021-09-27 ENCOUNTER — Inpatient Hospital Stay (HOSPITAL_BASED_OUTPATIENT_CLINIC_OR_DEPARTMENT_OTHER): Payer: PPO | Admitting: Internal Medicine

## 2021-09-27 VITALS — BP 142/62 | HR 77 | Temp 97.2°F | Resp 20 | Ht 72.0 in | Wt 280.3 lb

## 2021-09-27 DIAGNOSIS — C7932 Secondary malignant neoplasm of cerebral meninges: Secondary | ICD-10-CM | POA: Diagnosis not present

## 2021-09-27 DIAGNOSIS — C3431 Malignant neoplasm of lower lobe, right bronchus or lung: Secondary | ICD-10-CM | POA: Insufficient documentation

## 2021-09-27 DIAGNOSIS — C342 Malignant neoplasm of middle lobe, bronchus or lung: Secondary | ICD-10-CM | POA: Insufficient documentation

## 2021-09-27 DIAGNOSIS — C3490 Malignant neoplasm of unspecified part of unspecified bronchus or lung: Secondary | ICD-10-CM | POA: Diagnosis not present

## 2021-09-27 NOTE — Progress Notes (Signed)
[place] [phone]   [phone]) [phone]  OFFICE PROGRESS NOTE  [name], MD [place] [address]  DIAGNOSIS: Metastatic non-small cell lung cancer, adenocarcinoma initially diagnosed as stage IB (T2a, N0, M0) non-small cell lung cancer, adenocarcinoma presented with right lower lobe lung mass.  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  Biomarker Findings Tumor Mutational Burden - 11 Muts/Mb Microsatellite status - MS-Stable Genomic Findings For a complete list of the genes assayed, please refer to the Appendix. AKT3 amplification STK11 P253f*[id number] MDM4 amplification - equivocal? IKBKE amplification - equivocal? PARK2 [id number] PARP1 amplification - equivocal? PIK3C2B amplification - equivocal? SF3B1 K700E 8 Disease relevant genes with no reportable alterations: ALK, BRAF, EGFR, ERBB2, KRAS, MET, RET, ROS1  PDL 1 expression 0%.  PRIOR THERAPY: 1) Status post right middle and lower bilobectomies with lymph node dissection under the care of Dr. HeRoxan Hockeyn January 16, 2018. 2) status post SBRT to the metastatic lesion in the left adrenal gland in July 2020 under the care of Dr. KiSondra Come3) SBRT to the enlarging right adrenal gland mass under the care of Dr. KiSondra Comen February 2022.  CURRENT THERAPY: Observation.  INTERVAL HISTORY: Christian CARRERAS75.o. male returns to the clinic today for follow-up visit.  The patient is feeling fine today with no concerning complaints except for the baseline shortness of breath.  He has no chest pain, cough or hemoptysis.  He denied having any nausea, vomiting, diarrhea or constipation.  He denied having any headache or visual changes.  He is here today for evaluation with repeat CT scan of the chest, abdomen and pelvis for restaging of his disease.  MEDICAL HISTORY: Past Medical History:  Diagnosis Date   Anxiety    Arthritis    Cancer  ([place]   skin   Coronary artery disease    Diabetes mellitus type 2 with complications ([place]   not on medication was "taken off of list"; pt states that he was pre diabetic   Dyspnea    walks 100 yards then rest   GERD (gastroesophageal reflux disease)    ocassional no meds; pt states that its no longer an issue (01/14/18)   History of shingles    2004   Hyperlipidemia    Hypertension    lung ca dx'd 01/2018   right lung   Metastasis to adrenal gland [place]   OSA on CPAP    Pneumonia 10/2017    ALLERGIES:  is allergic to celebrex [celecoxib].  MEDICATIONS:  Current Outpatient Medications  Medication Sig Dispense Refill   albuterol (VENTOLIN HFA) 108 (90 Base) MCG/ACT inhaler Inhale 1-2 puffs into the lungs every 6 (six) hours as needed for wheezing or shortness of breath. 1 each 3   aspirin 81 MG EC tablet Take 162 mg by mouth daily.      baclofen (LIORESAL) 20 MG tablet TAKE 1 TABLET (20 MG TOTAL) BY MOUTH 2 (TWO) TIMES DAILY AS NEEDED FOR MUSCLE SPASMS. 180 tablet 1   clonazePAM (KLONOPIN) 0.5 MG tablet Take 1 tablet (0.5 mg total) by mouth at bedtime as needed. For insomnia 30 tablet 1   diphenhydrAMINE (BENADRYL) 25 mg capsule Take 25 mg by mouth at bedtime.      lisinopril (ZESTRIL) 10 MG tablet Take 1 tablet (10 mg total) by mouth daily. 90 tablet 3   Melatonin 10 MG TABS Take 10  mg by mouth at bedtime.      metFORMIN (GLUCOPHAGE) 500 MG tablet Take 1 tablet (500 mg total) by mouth daily with breakfast. (Patient taking differently: Take 500 mg by mouth at bedtime.) 90 tablet 3   metoprolol succinate (TOPROL-XL) 50 MG 24 hr tablet Take 1 tablet (50 mg total) by mouth daily. Take with or immediately following a meal. 90 tablet 3   nitroGLYCERIN (NITROSTAT) 0.4 MG SL tablet Place 1 tablet (0.4 mg total) under the tongue every 5 (five) minutes as needed for chest pain. 25 tablet 1   simvastatin (ZOCOR) 40 MG tablet TAKE 1 TABLET BY MOUTH EVERYDAY AT BEDTIME (Patient taking  differently: Take 40 mg by mouth at bedtime.) 90 tablet 3   vitamin E 100 UNIT capsule Take 100 Units by mouth daily.     ketoconazole (NIZORAL) 2 % cream Apply 1 application topically 2 (two) times daily as needed for irritation. (Patient not taking: No sig reported)     No current facility-administered medications for this visit.    SURGICAL HISTORY:  Past Surgical History:  Procedure Laterality Date   CARDIAC CATHETERIZATION     2010 @ [facility] (per pt)   COLONOSCOPY W/ POLYPECTOMY     EYE SURGERY Bilateral    cataracts   OPEN REDUCTION INTERNAL FIXATION (ORIF) DISTAL RADIAL FRACTURE Right 11/26/2013   Procedure: OPEN REDUCTION INTERNAL FIXATION (ORIF) DISTAL RADIAL FRACTURE;  Surgeon: Newt Minion, MD;  Location: [facility];  Service: Orthopedics;  Laterality: Right;  Open Reduction Internal Fixation Right Distal Radius    VIDEO ASSISTED THORACOSCOPY (VATS)/ LOBECTOMY Right 01/16/2018   Procedure: VIDEO ASSISTED THORACOSCOPY (VATS)/RIGHT MIDDLE AND LOWER LOBE LUNG LOBECTOMY WITH NODE BIOPSIES x6;  Surgeon: Melrose Nakayama, MD;  Location: [place];  Service: Thoracic;  Laterality: Right;   VIDEO BRONCHOSCOPY WITH ENDOBRONCHIAL NAVIGATION Right 12/15/2017   Procedure: VIDEO BRONCHOSCOPY WITH ENDOBRONCHIAL NAVIGATION;  Surgeon: Melrose Nakayama, MD;  Location: [place];  Service: Thoracic;  Laterality: Right;   VIDEO BRONCHOSCOPY WITH ENDOBRONCHIAL ULTRASOUND N/A 12/15/2017   Procedure: VIDEO BRONCHOSCOPY WITH ENDOBRONCHIAL ULTRASOUND;  Surgeon: Melrose Nakayama, MD;  Location: [place];  Service: Thoracic;  Laterality: N/A;    REVIEW OF SYSTEMS:  A comprehensive review of systems was negative except for: Constitutional: positive for fatigue Respiratory: positive for dyspnea on exertion Musculoskeletal: positive for arthralgias and muscle weakness   PHYSICAL EXAMINATION: General appearance: alert, cooperative, fatigued, and no distress Head: Normocephalic, without obvious  abnormality, atraumatic Neck: no adenopathy, no JVD, supple, symmetrical, trachea midline, and thyroid not enlarged, symmetric, no tenderness/mass/nodules Lymph nodes: Cervical, supraclavicular, and axillary nodes normal. Resp: clear to auscultation bilaterally Back: symmetric, no curvature. ROM normal. No CVA tenderness. Cardio: regular rate and rhythm, S1, S2 normal, no murmur, click, rub or gallop GI: soft, non-tender; bowel sounds normal; no masses,  no organomegaly Extremities: extremities normal, atraumatic, no cyanosis or edema  ECOG PERFORMANCE STATUS: 1 - Symptomatic but completely ambulatory  Blood pressure (!) 142/62, pulse 77, temperature (!) 97.2 F (36.2 C), temperature source Tympanic, resp. rate 20, height 6' (1.829 m), weight 280 lb 4.8 oz (127.1 kg), SpO2 98 %.  LABORATORY DATA: Lab Results  Component Value Date   WBC 8.0 09/24/2021   HGB 11.6 (L) 09/24/2021   HCT 33.7 (L) 09/24/2021   MCV 103.4 (H) 09/24/2021   PLT 159 09/24/2021      Chemistry      Component Value Date/Time   NA 140 09/24/2021 1342  K 5.0 09/24/2021 1342   CL 105 09/24/2021 1342   CO2 25 09/24/2021 1342   BUN 22 09/24/2021 1342   CREATININE 1.42 (H) 09/24/2021 1342   CREATININE 0.99 07/11/2016 1448      Component Value Date/Time   CALCIUM 9.6 09/24/2021 1342   ALKPHOS 79 09/24/2021 1342   AST 12 (L) 09/24/2021 1342   ALT 14 09/24/2021 1342   BILITOT 0.6 09/24/2021 1342       RADIOGRAPHIC STUDIES: CT Abdomen Pelvis Wo Contrast  Result Date: 09/25/2021 CLINICAL DATA:  Non-small cell lung cancer. EXAM: CT CHEST, ABDOMEN AND PELVIS WITHOUT CONTRAST TECHNIQUE: Multidetector CT imaging of the chest, abdomen and pelvis was performed following the standard protocol without IV contrast. COMPARISON:  Multiple previous imaging studies. The most recent is 07/11/2021 FINDINGS: CT CHEST FINDINGS Cardiovascular: The heart is normal in size. No pericardial effusion. Stable tortuosity and  calcification of the thoracic aorta and branch vessels. Stable three-vessel coronary artery calcifications. Mediastinum/Nodes: Stable small scattered mediastinal and hilar lymph nodes but no mass or overt adenopathy. The esophagus is grossly normal. The thyroid gland is unremarkable. Lungs/Pleura: Stable surgical changes from prior right middle lobe and right lower lobe lobectomies. Stable emphysematous changes and areas of pulmonary scarring. Stable 7 mm right lung nodule on image 68/4. Ill-defined nodular density in the lingula is again demonstrated. It continues to show progressive changes. It measures approximately 19 x 9 mm. It was a maximum of 9 mm in April 2022 and measured maximum of 13 mm on the CT scan from 07/11/2021. Findings are highly suspicious for a neoplastic process. PET-CT may be helpful for further evaluation. No new pulmonary lesions are identified. No new pulmonary nodules. No acute pulmonary process. Musculoskeletal: No significant bony findings. CT ABDOMEN PELVIS FINDINGS Hepatobiliary: Stable small low-attenuation lesion the right hepatic dome, likely a benign cyst. No worrisome hepatic lesions are identified without contrast. No intrahepatic biliary dilatation. The gallbladder is unremarkable. No common bile duct dilatation. Pancreas: No mass, inflammation or ductal dilatation. Spleen: Normal size.  No focal lesions. Adrenals/Urinary Tract: Adrenal glands and kidneys are unremarkable. The small adrenal gland nodules are no longer identified. No renal lesions. Stable renal cysts and extensive renal artery calcifications. The bladder is unremarkable. Stomach/Bowel: The stomach, duodenum, small bowel and colon are unremarkable. No acute inflammatory changes, mass lesions obstructive findings. Terminal ileum is normal. The appendix is normal. There is a moderate to large amount of stool throughout the colon and down into the rectum which could suggest constipation. Vascular/Lymphatic: Stable  advanced atherosclerotic calcifications involving the aorta and iliac arteries and branch vessels. No mesenteric or retroperitoneal mass or adenopathy. Reproductive: Stable mild prostate gland enlargement. The seminal vesicles are unremarkable. Other: No pelvic mass or adenopathy. No free pelvic fluid collections. No inguinal mass or adenopathy. No abdominal wall hernia or subcutaneous lesions. Musculoskeletal: No significant bony findings. Stable compression deformities in the thoracic and lumbar spines. IMPRESSION: 1. Continued progressive enlargement of the lingular nodule worrisome for neoplastic process. PET-CT suggested for further evaluation. 2. Stable 7 mm right lung nodule. 3. Stable surgical changes from prior right middle lobe and right lower lobe lobectomies. 4. No findings for abdominal/pelvic metastatic disease. The small bilateral adrenal gland nodules have resolved. 5. Stable advanced atherosclerotic calcifications involving the thoracic and abdominal aorta and branch vessels including the coronary arteries. 6. Moderate to large amount of stool throughout the colon and down into the rectum could suggest constipation. Aortic Atherosclerosis (ICD10-I70.0). Electronically Signed   By:  Marijo Sanes M.D.   On: 09/25/2021 16:46   CT Chest Wo Contrast  Result Date: 09/25/2021 CLINICAL DATA:  Non-small cell lung cancer. EXAM: CT CHEST, ABDOMEN AND PELVIS WITHOUT CONTRAST TECHNIQUE: Multidetector CT imaging of the chest, abdomen and pelvis was performed following the standard protocol without IV contrast. COMPARISON:  Multiple previous imaging studies. The most recent is 07/11/2021 FINDINGS: CT CHEST FINDINGS Cardiovascular: The heart is normal in size. No pericardial effusion. Stable tortuosity and calcification of the thoracic aorta and branch vessels. Stable three-vessel coronary artery calcifications. Mediastinum/Nodes: Stable small scattered mediastinal and hilar lymph nodes but no mass or overt  adenopathy. The esophagus is grossly normal. The thyroid gland is unremarkable. Lungs/Pleura: Stable surgical changes from prior right middle lobe and right lower lobe lobectomies. Stable emphysematous changes and areas of pulmonary scarring. Stable 7 mm right lung nodule on image 68/4. Ill-defined nodular density in the lingula is again demonstrated. It continues to show progressive changes. It measures approximately 19 x 9 mm. It was a maximum of 9 mm in April 2022 and measured maximum of 13 mm on the CT scan from 07/11/2021. Findings are highly suspicious for a neoplastic process. PET-CT may be helpful for further evaluation. No new pulmonary lesions are identified. No new pulmonary nodules. No acute pulmonary process. Musculoskeletal: No significant bony findings. CT ABDOMEN PELVIS FINDINGS Hepatobiliary: Stable small low-attenuation lesion the right hepatic dome, likely a benign cyst. No worrisome hepatic lesions are identified without contrast. No intrahepatic biliary dilatation. The gallbladder is unremarkable. No common bile duct dilatation. Pancreas: No mass, inflammation or ductal dilatation. Spleen: Normal size.  No focal lesions. Adrenals/Urinary Tract: Adrenal glands and kidneys are unremarkable. The small adrenal gland nodules are no longer identified. No renal lesions. Stable renal cysts and extensive renal artery calcifications. The bladder is unremarkable. Stomach/Bowel: The stomach, duodenum, small bowel and colon are unremarkable. No acute inflammatory changes, mass lesions obstructive findings. Terminal ileum is normal. The appendix is normal. There is a moderate to large amount of stool throughout the colon and down into the rectum which could suggest constipation. Vascular/Lymphatic: Stable advanced atherosclerotic calcifications involving the aorta and iliac arteries and branch vessels. No mesenteric or retroperitoneal mass or adenopathy. Reproductive: Stable mild prostate gland enlargement.  The seminal vesicles are unremarkable. Other: No pelvic mass or adenopathy. No free pelvic fluid collections. No inguinal mass or adenopathy. No abdominal wall hernia or subcutaneous lesions. Musculoskeletal: No significant bony findings. Stable compression deformities in the thoracic and lumbar spines. IMPRESSION: 1. Continued progressive enlargement of the lingular nodule worrisome for neoplastic process. PET-CT suggested for further evaluation. 2. Stable 7 mm right lung nodule. 3. Stable surgical changes from prior right middle lobe and right lower lobe lobectomies. 4. No findings for abdominal/pelvic metastatic disease. The small bilateral adrenal gland nodules have resolved. 5. Stable advanced atherosclerotic calcifications involving the thoracic and abdominal aorta and branch vessels including the coronary arteries. 6. Moderate to large amount of stool throughout the colon and down into the rectum could suggest constipation. Aortic Atherosclerosis (ICD10-I70.0). Electronically Signed   By: Marijo Sanes M.D.   On: 09/25/2021 16:46    ASSESSMENT AND PLAN: This is a very pleasant 75 years old white male with a stage IB non-small cell lung cancer, adenocarcinoma status post right middle and lower bilobectomies with lymph node dissection under the care of Dr. Roxan Hockey on January 16, 2018.   The patient is currently on observation and he is feeling fine with no concerning complaints.  Recent CT scan of the chest, abdomen and pelvis showed increase in the size of left adrenal mass suspicious for adrenal metastasis.  These findings were confirmed with the PET scan. The patient underwent CT-guided core biopsy of the left adrenal gland lesion and it was consistent with metastatic adenocarcinoma from likely lung primary. The patient underwent SBRT to the left adrenal gland lesion under the care of Dr. Sondra Come and he tolerated the procedure well. The patient also underwent SBRT to the right adrenal gland lesion  under the care of Dr. Sondra Come in February 2022 and tolerated it well. The patient is currently on observation and he is feeling fine except for the baseline shortness of breath and generalized fatigue. He had repeat CT scan of the chest, abdomen pelvis performed recently.  I personally and independently reviewed the scan images and discussed the result and showed the images to the patient today. His scan showed continued progression and enlargement of a lingular nodule. I recommended for the patient to see Dr. Sondra Come for consideration of SBRT to this lesion. I will see him back for follow-up visit in 6 months for evaluation with repeat CT scan of the chest, abdomen and pelvis for restaging of his disease. The patient was advised to call immediately if he has any other concerning symptoms in the interval.  The patient voices understanding of current disease status and treatment options and is in agreement with the current care plan. All questions were answered. The patient knows to call the clinic with any problems, questions or concerns. We can certainly see the patient much sooner if necessary.  Disclaimer: This note was dictated with voice recognition software. Similar sounding words can inadvertently be transcribed and may not be corrected upon review.

## 2021-09-28 ENCOUNTER — Telehealth: Payer: Self-pay | Admitting: Radiation Oncology

## 2021-09-28 ENCOUNTER — Telehealth: Payer: Self-pay | Admitting: Internal Medicine

## 2021-09-28 NOTE — Telephone Encounter (Signed)
Left message with follow-up appointments per 10/20 los.

## 2021-10-02 DIAGNOSIS — C3412 Malignant neoplasm of upper lobe, left bronchus or lung: Secondary | ICD-10-CM | POA: Insufficient documentation

## 2021-10-02 NOTE — Progress Notes (Signed)
Histology and Location of Primary Cancer: RLL NSCLC  Location(s) of Symptomatic tumor(s): scan showed continued progression and enlargement of a lingular nodule  Past/Anticipated chemotherapy by medical oncology, if any:  PRIOR THERAPY: 1) Status post right middle and lower bilobectomies with lymph node dissection under the care of Dr. Roxan Hockey on January 16, 2018. 2) status post SBRT to the metastatic lesion in the left adrenal gland in July 2020 under the care of Dr. Sondra Come. 3) SBRT to the enlarging right adrenal gland mass under the care of Dr. Sondra Come in February 2022.   CURRENT THERAPY: Observation.  Patient's main complaints related to symptomatic tumor(s) are: feeling fine today with no concerning complaints except for fatigue and shortness of breath at baseline increased with exertion.  Pain on a scale of 0-10 is: 0   Ambulatory status? Walker? Wheelchair?: ambulatory  SAFETY ISSUES: Prior radiation?  status post SBRT to the metastatic lesion in the left adrenal gland in July 2020 under the care of Dr. Sondra Come. SBRT to the enlarging right adrenal gland mass under the care of Dr. Sondra Come in February 2022. Pacemaker/ICD? no Possible current pregnancy? no Is the patient on methotrexate? no  Additional Complaints / other details:  severe shortness of breath at rest and with activity that is worsening, denies cough or hemoptysis   Vitals:   10/04/21 1446  BP: 133/66  Pulse: 70  Resp: 20  Temp: (!) 97.4 F (36.3 C)  SpO2: 97%  Weight: 279 lb (126.6 kg)  Height: 6' (1.829 m)

## 2021-10-03 NOTE — Progress Notes (Signed)
Radiation Oncology         [phone] ________________________________  Outpatient Re-Evaluation Note   Name: Christian [name] MRN: [id number]  Date: 10/04/2021  DOB: [birth year]  [id number], Elveria Rising, MD  Curt Bears, MD   REFERRING PHYSICIAN: Curt Bears, MD  DIAGNOSIS: The encounter diagnosis was Malignant neoplasm of lingula of left lung ([place]).  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   Interval since last radiation: 8 months and 16 days  Intent: Curative Radiation Treatment Dates: 01/09/2021 through 01/19/2021 Site Technique Total Dose (Gy) Dose per Fx (Gy) Completed Fx Beam Energies  Abdomen: Abd adrenal IMRT 33/33 6.6 5/5 6XFFF    Radiation Treatment Dates: 06/30/2019 through 07/09/2019 Site Technique Total Dose Dose per Fx Completed Fx Beam Energies  Abdomen: Abd_LT Adrenal IMRT 40/40 8 5/5 10XFFF    Narrative: The patient returns today to discuss radiation treatment options He was last seen on 01/19/21 during his final treatment directed at the abdomen. Since then, he has been followed closely by Dr. Julien Nordmann. He remained under observation with routine imaging.   The patient recently presented [facility] on 07/11/21 with gradual worsening of his chronic dyspnea for 2-3 weeks, defined by SOB on exertion over shorter distances than before. CTA performed for evaluation was negative for PE but did show evidence of metastatic disease which is changed from prior CT.   CT of the chest abdomen and pelvis on 09/24/21 showed continued progressive enlargement of the lingular nodule, noted as worrisome for neoplastic process. (Nodule  continued to show enlargement since CT on 03/27/21).   Accordingly, the patient followed up with Dr. Julien Nordmann on 09/27/21 who referred the patient to me for consideration of SBRT to the growing lesion.   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   OSA on CPAP    Pneumonia 10/2017    PAST SURGICAL HISTORY: Past Surgical History:  Procedure Laterality Date   CARDIAC CATHETERIZATION     2010 @ [facility] (per pt)   COLONOSCOPY W/ POLYPECTOMY     EYE SURGERY Bilateral    cataracts   OPEN REDUCTION INTERNAL FIXATION (ORIF) DISTAL RADIAL FRACTURE Right 11/26/2013   Procedure: OPEN REDUCTION INTERNAL FIXATION (ORIF) DISTAL RADIAL FRACTURE;  Surgeon: Newt Minion, MD;  Location: [place];  Service: Orthopedics;  Laterality: Right;  Open Reduction Internal Fixation Right Distal Radius    VIDEO ASSISTED THORACOSCOPY (VATS)/ LOBECTOMY Right 01/16/2018   Procedure: VIDEO ASSISTED THORACOSCOPY (VATS)/RIGHT MIDDLE AND LOWER LOBE LUNG LOBECTOMY WITH  NODE BIOPSIES x6;  Surgeon: Melrose Nakayama, MD;  Location: [place];  Service: Thoracic;  Laterality: Right;   VIDEO BRONCHOSCOPY WITH ENDOBRONCHIAL NAVIGATION Right 12/15/2017   Procedure: VIDEO BRONCHOSCOPY WITH ENDOBRONCHIAL NAVIGATION;  Surgeon: Melrose Nakayama, MD;  Location: [facility];  Service: Thoracic;  Laterality: Right;   VIDEO BRONCHOSCOPY WITH ENDOBRONCHIAL ULTRASOUND N/A 12/15/2017   Procedure: VIDEO BRONCHOSCOPY  WITH ENDOBRONCHIAL ULTRASOUND;  Surgeon: Melrose Nakayama, MD;  Location: [facility];  Service: Thoracic;  Laterality: N/A;    FAMILY HISTORY:  Family History  Problem Relation Age of Onset   Heart disease Mother    Heart disease Father    Colon cancer Father        possible colon or prostate cancer, dx'd in his 58s, patient wasn't sure of source.    Prostate cancer Father        possible colon or prostate cancer, dx'd in his 17s, patient wasn't sure of source.    Heart disease Brother    Diabetes Brother    Kidney disease Brother    Heart disease Sister    Cancer Sister     SOCIAL HISTORY:  Social History   Tobacco Use   Smoking status: Former    Packs/day: 1.00    Years: 51.00    Pack years: 51.00    Types: Cigarettes, E-cigarettes    Quit date: 01/15/2018    Years since quitting: 3.7   Smokeless tobacco: Never   Tobacco comments:    Pt currently vapes daily  Vaping Use   Vaping Use: Every day  Substance Use Topics   Alcohol use: Yes    Alcohol/week: 7.0 standard drinks    Types: 7 Glasses of wine per week    Comment: 1 glass of wine per night-    Drug use: No    ALLERGIES:  Allergies  Allergen Reactions   Celebrex [Celecoxib] Itching    MEDICATIONS:  Current Outpatient Medications  Medication Sig Dispense Refill   albuterol (VENTOLIN HFA) 108 (90 Base) MCG/ACT inhaler Inhale 1-2 puffs into the lungs every 6 (six) hours as needed for wheezing or shortness of breath. 1 each 3   aspirin 81 MG EC tablet Take 162 mg by mouth daily.      baclofen (LIORESAL) 20 MG tablet TAKE 1 TABLET (20 MG TOTAL) BY MOUTH 2 (TWO) TIMES DAILY AS NEEDED FOR MUSCLE SPASMS. 180 tablet 1   clonazePAM (KLONOPIN) 0.5 MG tablet Take 1 tablet (0.5 mg total) by mouth at bedtime as needed. For insomnia 30 tablet 1   diphenhydrAMINE (BENADRYL) 25 mg capsule Take 25 mg by mouth at bedtime.      ketoconazole (NIZORAL) 2 % cream Apply 1 application topically 2 (two) times daily as needed for  irritation.     lisinopril (ZESTRIL) 10 MG tablet Take 1 tablet (10 mg total) by mouth daily. 90 tablet 3   Melatonin 10 MG TABS Take 10 mg by mouth at bedtime.      metFORMIN (GLUCOPHAGE) 500 MG tablet Take 1 tablet (500 mg total) by mouth daily with breakfast. (Patient taking differently: Take 500 mg by mouth at bedtime.) 90 tablet 3   metoprolol succinate (TOPROL-XL) 50 MG 24 hr tablet Take 1 tablet (50 mg total) by mouth daily. Take with or immediately following a meal. 90 tablet 3   nitroGLYCERIN (NITROSTAT) 0.4 MG SL tablet Place 1 tablet (0.4 mg total) under the tongue every 5 (five) minutes as needed for chest pain. 25 tablet  1   simvastatin (ZOCOR) 40 MG tablet TAKE 1 TABLET BY MOUTH EVERYDAY AT BEDTIME (Patient taking differently: Take 40 mg by mouth at bedtime.) 90 tablet 3   vitamin E 100 UNIT capsule Take 100 Units by mouth daily.     No current facility-administered medications for this encounter.    REVIEW OF SYSTEMS:  A 10+ POINT REVIEW OF SYSTEMS WAS OBTAINED including neurology, dermatology, psychiatry, cardiac, respiratory, lymph, extremities, GI, GU, musculoskeletal, constitutional, reproductive, HEENT.  The patient denies any pain within the chest area significant cough or hemoptysis.  He reports progressive dyspnea on exertion.  He can ambulate approximately half a flight of steps before stopping   PHYSICAL EXAM:  height is 6' (1.829 m) and weight is 279 lb (126.6 kg). His temperature is 97.4 F (36.3 C) (abnormal). His blood pressure is 133/66 and his pulse is 70. His respiration is 20 and oxygen saturation is 97%.   General: Alert and oriented, in no acute distress HEENT: Head is normocephalic. Extraocular movements are intact.  Neck: Neck is supple, no palpable cervical or supraclavicular lymphadenopathy. Heart: Regular in rate and rhythm with no murmurs, rubs, or gallops. Chest: Clear to auscultation bilaterally, with no rhonchi, wheezes, or rales. Abdomen: Soft,  nontender, nondistended, with no rigidity or guarding. Extremities: No cyanosis or edema. Lymphatics: see Neck Exam Skin: No concerning lesions. Musculoskeletal: symmetric strength and muscle tone throughout. Neurologic: Cranial nerves II through XII are grossly intact. No obvious focalities. Speech is fluent. Coordination is intact. Psychiatric: Judgment and insight are intact. Affect is appropriate.   ECOG = 1  0 - Asymptomatic (Fully active, able to carry on all predisease activities without restriction)  1 - Symptomatic but completely ambulatory (Restricted in physically strenuous activity but ambulatory and able to carry out work of a light or sedentary nature. For example, light housework, office work)  2 - Symptomatic, <50% in bed during the day (Ambulatory and capable of all self care but unable to carry out any work activities. Up and about more than 50% of waking hours)  3 - Symptomatic, >50% in bed, but not bedbound (Capable of only limited self-care, confined to bed or chair 50% or more of waking hours)  4 - Bedbound (Completely disabled. Cannot carry on any self-care. Totally confined to bed or chair)  5 - Death   Eustace Pen MM, Creech RH, Tormey DC, et al. [phone]). "Toxicity and response criteria of the [facility]". [place] Oncol. 5 (6): 649-55  LABORATORY DATA:  Lab Results  Component Value Date   WBC 8.0 09/24/2021   HGB 11.6 (L) 09/24/2021   HCT 33.7 (L) 09/24/2021   MCV 103.4 (H) 09/24/2021   PLT 159 09/24/2021   NEUTROABS 5.6 09/24/2021   Lab Results  Component Value Date   NA 140 09/24/2021   K 5.0 09/24/2021   CL 105 09/24/2021   CO2 25 09/24/2021   GLUCOSE 232 (H) 09/24/2021   CREATININE 1.42 (H) 09/24/2021   CALCIUM 9.6 09/24/2021      RADIOGRAPHY: CT Abdomen Pelvis Wo Contrast  Result Date: 09/25/2021 CLINICAL DATA:  Non-small cell lung cancer. EXAM: CT CHEST, ABDOMEN AND PELVIS WITHOUT CONTRAST TECHNIQUE: Multidetector  CT imaging of the chest, abdomen and pelvis was performed following the standard protocol without IV contrast. COMPARISON:  Multiple previous imaging studies. The most recent is 07/11/2021 FINDINGS: CT CHEST FINDINGS Cardiovascular: The heart is normal in size. No pericardial effusion. Stable tortuosity and calcification of the thoracic aorta  and branch vessels. Stable three-vessel coronary artery calcifications. Mediastinum/Nodes: Stable small scattered mediastinal and hilar lymph nodes but no mass or overt adenopathy. The esophagus is grossly normal. The thyroid gland is unremarkable. Lungs/Pleura: Stable surgical changes from prior right middle lobe and right lower lobe lobectomies. Stable emphysematous changes and areas of pulmonary scarring. Stable 7 mm right lung nodule on image 68/4. Ill-defined nodular density in the lingula is again demonstrated. It continues to show progressive changes. It measures approximately 19 x 9 mm. It was a maximum of 9 mm in April 2022 and measured maximum of 13 mm on the CT scan from 07/11/2021. Findings are highly suspicious for a neoplastic process. PET-CT may be helpful for further evaluation. No new pulmonary lesions are identified. No new pulmonary nodules. No acute pulmonary process. Musculoskeletal: No significant bony findings. CT ABDOMEN PELVIS FINDINGS Hepatobiliary: Stable small low-attenuation lesion the right hepatic dome, likely a benign cyst. No worrisome hepatic lesions are identified without contrast. No intrahepatic biliary dilatation. The gallbladder is unremarkable. No common bile duct dilatation. Pancreas: No mass, inflammation or ductal dilatation. Spleen: Normal size.  No focal lesions. Adrenals/Urinary Tract: Adrenal glands and kidneys are unremarkable. The small adrenal gland nodules are no longer identified. No renal lesions. Stable renal cysts and extensive renal artery calcifications. The bladder is unremarkable. Stomach/Bowel: The stomach, duodenum,  small bowel and colon are unremarkable. No acute inflammatory changes, mass lesions obstructive findings. Terminal ileum is normal. The appendix is normal. There is a moderate to large amount of stool throughout the colon and down into the rectum which could suggest constipation. Vascular/Lymphatic: Stable advanced atherosclerotic calcifications involving the aorta and iliac arteries and branch vessels. No mesenteric or retroperitoneal mass or adenopathy. Reproductive: Stable mild prostate gland enlargement. The seminal vesicles are unremarkable. Other: No pelvic mass or adenopathy. No free pelvic fluid collections. No inguinal mass or adenopathy. No abdominal wall hernia or subcutaneous lesions. Musculoskeletal: No significant bony findings. Stable compression deformities in the thoracic and lumbar spines. IMPRESSION: 1. Continued progressive enlargement of the lingular nodule worrisome for neoplastic process. PET-CT suggested for further evaluation. 2. Stable 7 mm right lung nodule. 3. Stable surgical changes from prior right middle lobe and right lower lobe lobectomies. 4. No findings for abdominal/pelvic metastatic disease. The small bilateral adrenal gland nodules have resolved. 5. Stable advanced atherosclerotic calcifications involving the thoracic and abdominal aorta and branch vessels including the coronary arteries. 6. Moderate to large amount of stool throughout the colon and down into the rectum could suggest constipation. Aortic Atherosclerosis (ICD10-I70.0). Electronically Signed   By: Marijo Sanes M.D.   On: 09/25/2021 16:46   CT Chest Wo Contrast  Result Date: 09/25/2021 CLINICAL DATA:  Non-small cell lung cancer. EXAM: CT CHEST, ABDOMEN AND PELVIS WITHOUT CONTRAST TECHNIQUE: Multidetector CT imaging of the chest, abdomen and pelvis was performed following the standard protocol without IV contrast. COMPARISON:  Multiple previous imaging studies. The most recent is 07/11/2021 FINDINGS: CT CHEST  FINDINGS Cardiovascular: The heart is normal in size. No pericardial effusion. Stable tortuosity and calcification of the thoracic aorta and branch vessels. Stable three-vessel coronary artery calcifications. Mediastinum/Nodes: Stable small scattered mediastinal and hilar lymph nodes but no mass or overt adenopathy. The esophagus is grossly normal. The thyroid gland is unremarkable. Lungs/Pleura: Stable surgical changes from prior right middle lobe and right lower lobe lobectomies. Stable emphysematous changes and areas of pulmonary scarring. Stable 7 mm right lung nodule on image 68/4. Ill-defined nodular density in the lingula is again  demonstrated. It continues to show progressive changes. It measures approximately 19 x 9 mm. It was a maximum of 9 mm in April 2022 and measured maximum of 13 mm on the CT scan from 07/11/2021. Findings are highly suspicious for a neoplastic process. PET-CT may be helpful for further evaluation. No new pulmonary lesions are identified. No new pulmonary nodules. No acute pulmonary process. Musculoskeletal: No significant bony findings. CT ABDOMEN PELVIS FINDINGS Hepatobiliary: Stable small low-attenuation lesion the right hepatic dome, likely a benign cyst. No worrisome hepatic lesions are identified without contrast. No intrahepatic biliary dilatation. The gallbladder is unremarkable. No common bile duct dilatation. Pancreas: No mass, inflammation or ductal dilatation. Spleen: Normal size.  No focal lesions. Adrenals/Urinary Tract: Adrenal glands and kidneys are unremarkable. The small adrenal gland nodules are no longer identified. No renal lesions. Stable renal cysts and extensive renal artery calcifications. The bladder is unremarkable. Stomach/Bowel: The stomach, duodenum, small bowel and colon are unremarkable. No acute inflammatory changes, mass lesions obstructive findings. Terminal ileum is normal. The appendix is normal. There is a moderate to large amount of stool  throughout the colon and down into the rectum which could suggest constipation. Vascular/Lymphatic: Stable advanced atherosclerotic calcifications involving the aorta and iliac arteries and branch vessels. No mesenteric or retroperitoneal mass or adenopathy. Reproductive: Stable mild prostate gland enlargement. The seminal vesicles are unremarkable. Other: No pelvic mass or adenopathy. No free pelvic fluid collections. No inguinal mass or adenopathy. No abdominal wall hernia or subcutaneous lesions. Musculoskeletal: No significant bony findings. Stable compression deformities in the thoracic and lumbar spines. IMPRESSION: 1. Continued progressive enlargement of the lingular nodule worrisome for neoplastic process. PET-CT suggested for further evaluation. 2. Stable 7 mm right lung nodule. 3. Stable surgical changes from prior right middle lobe and right lower lobe lobectomies. 4. No findings for abdominal/pelvic metastatic disease. The small bilateral adrenal gland nodules have resolved. 5. Stable advanced atherosclerotic calcifications involving the thoracic and abdominal aorta and branch vessels including the coronary arteries. 6. Moderate to large amount of stool throughout the colon and down into the rectum could suggest constipation. Aortic Atherosclerosis (ICD10-I70.0). Electronically Signed   By: Marijo Sanes M.D.   On: 09/25/2021 16:46      IMPRESSION: Progressive enlargement of the lingular nodule  The lingula nodule measuring 19 x 9 mm and has progressively enlarged.  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  The lingula nodule measures 19 x 9 mm and has  progressively enlarged.  The patient would be a good candidate for additional SBRT.  Treatments would be directed at the oligometastasis within the lingula of the left lung..  Today, I talked to the patient and about the findings and work-up thus far.  We discussed the natural history of non-small cell lung cancer and general treatment, highlighting the role of radiotherapy in the management.  We discussed the available radiation techniques, and focused on the details of logistics and delivery.  We reviewed the anticipated acute and late sequelae associated with radiation in this setting.  The patient was encouraged to ask questions that I answered to the best of my ability.  A patient consent form was discussed and signed.  We retained a copy for our  records.  The patient would like to proceed with radiation and will be scheduled for CT simulation.  PLAN: The patient will return for CT simulation on November 3 with treatments to begin approximately a week later.  Anticipate 3 SBRT treatments directed at the lingula nodule.   35 minutes of total time was spent for this patient encounter, including preparation, face-to-face counseling with the patient and coordination of care, physical exam, and documentation of the encounter.   ------------------------------------------------  Blair Promise, PhD, MD  This document serves as a record of services personally performed by Gery Pray, MD. It was created on his behalf by Roney Mans, a trained medical scribe. The creation of this record is based on the scribe's personal observations and the provider's statements to them. This document has been checked and approved by the attending provider.

## 2021-10-04 ENCOUNTER — Encounter: Payer: Self-pay | Admitting: Radiation Oncology

## 2021-10-04 ENCOUNTER — Ambulatory Visit
Admission: RE | Admit: 2021-10-04 | Discharge: 2021-10-04 | Disposition: A | Discharge status: Home / Self Care | Payer: PPO | Source: Ambulatory Visit | Attending: Radiation Oncology | Admitting: Radiation Oncology

## 2021-10-04 ENCOUNTER — Other Ambulatory Visit: Payer: Self-pay

## 2021-10-04 VITALS — BP 133/66 | HR 70 | Temp 97.4°F | Resp 20 | Ht 72.0 in | Wt 279.0 lb

## 2021-10-04 DIAGNOSIS — Z7982 Long term (current) use of aspirin: Secondary | ICD-10-CM | POA: Insufficient documentation

## 2021-10-04 DIAGNOSIS — N281 Cyst of kidney, acquired: Secondary | ICD-10-CM | POA: Insufficient documentation

## 2021-10-04 DIAGNOSIS — Z800 Family history of malignant neoplasm of digestive organs: Secondary | ICD-10-CM | POA: Insufficient documentation

## 2021-10-04 DIAGNOSIS — Z8042 Family history of malignant neoplasm of prostate: Secondary | ICD-10-CM | POA: Insufficient documentation

## 2021-10-04 DIAGNOSIS — C7972 Secondary malignant neoplasm of left adrenal gland: Secondary | ICD-10-CM | POA: Diagnosis not present

## 2021-10-04 DIAGNOSIS — C3412 Malignant neoplasm of upper lobe, left bronchus or lung: Secondary | ICD-10-CM

## 2021-10-04 DIAGNOSIS — Z79899 Other long term (current) drug therapy: Secondary | ICD-10-CM | POA: Insufficient documentation

## 2021-10-04 DIAGNOSIS — Z7984 Long term (current) use of oral hypoglycemic drugs: Secondary | ICD-10-CM | POA: Diagnosis not present

## 2021-10-04 DIAGNOSIS — N400 Enlarged prostate without lower urinary tract symptoms: Secondary | ICD-10-CM | POA: Insufficient documentation

## 2021-10-04 DIAGNOSIS — Z87891 Personal history of nicotine dependence: Secondary | ICD-10-CM | POA: Diagnosis not present

## 2021-10-04 DIAGNOSIS — E785 Hyperlipidemia, unspecified: Secondary | ICD-10-CM | POA: Insufficient documentation

## 2021-10-04 DIAGNOSIS — I10 Essential (primary) hypertension: Secondary | ICD-10-CM | POA: Diagnosis not present

## 2021-10-04 DIAGNOSIS — Z809 Family history of malignant neoplasm, unspecified: Secondary | ICD-10-CM | POA: Diagnosis not present

## 2021-10-04 DIAGNOSIS — K219 Gastro-esophageal reflux disease without esophagitis: Secondary | ICD-10-CM | POA: Diagnosis not present

## 2021-10-04 DIAGNOSIS — I2510 Atherosclerotic heart disease of native coronary artery without angina pectoris: Secondary | ICD-10-CM | POA: Diagnosis not present

## 2021-10-04 DIAGNOSIS — G4730 Sleep apnea, unspecified: Secondary | ICD-10-CM | POA: Diagnosis not present

## 2021-10-04 NOTE — Progress Notes (Signed)
See MD note for nursing evaluation. °

## 2021-10-11 ENCOUNTER — Ambulatory Visit
Admission: RE | Admit: 2021-10-11 | Discharge: 2021-10-11 | Disposition: A | Discharge status: Home / Self Care | Payer: PPO | Source: Ambulatory Visit | Attending: Radiation Oncology | Admitting: Radiation Oncology

## 2021-10-11 ENCOUNTER — Other Ambulatory Visit: Payer: Self-pay

## 2021-10-11 DIAGNOSIS — Z510 Encounter for antineoplastic radiation therapy: Secondary | ICD-10-CM | POA: Diagnosis not present

## 2021-10-11 DIAGNOSIS — C3431 Malignant neoplasm of lower lobe, right bronchus or lung: Secondary | ICD-10-CM | POA: Diagnosis not present

## 2021-10-11 DIAGNOSIS — Z87891 Personal history of nicotine dependence: Secondary | ICD-10-CM | POA: Insufficient documentation

## 2021-10-11 DIAGNOSIS — C7972 Secondary malignant neoplasm of left adrenal gland: Secondary | ICD-10-CM | POA: Insufficient documentation

## 2021-10-11 DIAGNOSIS — C3412 Malignant neoplasm of upper lobe, left bronchus or lung: Secondary | ICD-10-CM | POA: Diagnosis not present

## 2021-10-11 DIAGNOSIS — C342 Malignant neoplasm of middle lobe, bronchus or lung: Secondary | ICD-10-CM | POA: Diagnosis not present

## 2021-10-11 DIAGNOSIS — C3491 Malignant neoplasm of unspecified part of right bronchus or lung: Secondary | ICD-10-CM

## 2021-10-17 DIAGNOSIS — C3412 Malignant neoplasm of upper lobe, left bronchus or lung: Secondary | ICD-10-CM | POA: Diagnosis not present

## 2021-10-17 DIAGNOSIS — Z87891 Personal history of nicotine dependence: Secondary | ICD-10-CM | POA: Diagnosis not present

## 2021-10-17 DIAGNOSIS — Z510 Encounter for antineoplastic radiation therapy: Secondary | ICD-10-CM | POA: Diagnosis not present

## 2021-10-22 ENCOUNTER — Other Ambulatory Visit: Payer: Self-pay

## 2021-10-22 ENCOUNTER — Ambulatory Visit
Admission: RE | Admit: 2021-10-22 | Discharge: 2021-10-22 | Disposition: A | Discharge status: Home / Self Care | Payer: PPO | Source: Ambulatory Visit | Attending: Radiation Oncology | Admitting: Radiation Oncology

## 2021-10-22 DIAGNOSIS — C3412 Malignant neoplasm of upper lobe, left bronchus or lung: Secondary | ICD-10-CM

## 2021-10-22 DIAGNOSIS — Z87891 Personal history of nicotine dependence: Secondary | ICD-10-CM | POA: Diagnosis not present

## 2021-10-22 DIAGNOSIS — Z510 Encounter for antineoplastic radiation therapy: Secondary | ICD-10-CM | POA: Diagnosis not present

## 2021-10-23 ENCOUNTER — Ambulatory Visit
Admission: RE | Admit: 2021-10-23 | Discharge: 2021-10-23 | Disposition: A | Discharge status: Home / Self Care | Payer: PPO | Source: Ambulatory Visit | Attending: Radiation Oncology | Admitting: Radiation Oncology

## 2021-10-23 ENCOUNTER — Ambulatory Visit: Payer: PPO | Admitting: Radiation Oncology

## 2021-10-23 DIAGNOSIS — C3412 Malignant neoplasm of upper lobe, left bronchus or lung: Secondary | ICD-10-CM | POA: Diagnosis not present

## 2021-10-23 DIAGNOSIS — Z510 Encounter for antineoplastic radiation therapy: Secondary | ICD-10-CM | POA: Diagnosis not present

## 2021-10-23 DIAGNOSIS — Z87891 Personal history of nicotine dependence: Secondary | ICD-10-CM | POA: Diagnosis not present

## 2021-10-24 ENCOUNTER — Other Ambulatory Visit: Payer: Self-pay

## 2021-10-24 ENCOUNTER — Ambulatory Visit: Payer: PPO | Admitting: Radiation Oncology

## 2021-10-24 ENCOUNTER — Ambulatory Visit
Admission: RE | Admit: 2021-10-24 | Discharge: 2021-10-24 | Disposition: A | Discharge status: Home / Self Care | Payer: PPO | Source: Ambulatory Visit | Attending: Radiation Oncology | Admitting: Radiation Oncology

## 2021-10-24 DIAGNOSIS — C3412 Malignant neoplasm of upper lobe, left bronchus or lung: Secondary | ICD-10-CM | POA: Diagnosis not present

## 2021-10-24 DIAGNOSIS — Z510 Encounter for antineoplastic radiation therapy: Secondary | ICD-10-CM | POA: Diagnosis not present

## 2021-10-24 DIAGNOSIS — Z87891 Personal history of nicotine dependence: Secondary | ICD-10-CM | POA: Diagnosis not present

## 2021-10-25 ENCOUNTER — Ambulatory Visit
Admission: RE | Admit: 2021-10-25 | Discharge: 2021-10-25 | Disposition: A | Discharge status: Home / Self Care | Payer: PPO | Source: Ambulatory Visit | Attending: Radiation Oncology | Admitting: Radiation Oncology

## 2021-10-25 DIAGNOSIS — Z87891 Personal history of nicotine dependence: Secondary | ICD-10-CM | POA: Diagnosis not present

## 2021-10-25 DIAGNOSIS — Z510 Encounter for antineoplastic radiation therapy: Secondary | ICD-10-CM | POA: Diagnosis not present

## 2021-10-25 DIAGNOSIS — C3412 Malignant neoplasm of upper lobe, left bronchus or lung: Secondary | ICD-10-CM

## 2021-10-26 ENCOUNTER — Telehealth: Payer: Self-pay | Admitting: Family Medicine

## 2021-10-26 ENCOUNTER — Ambulatory Visit
Admission: RE | Admit: 2021-10-26 | Discharge: 2021-10-26 | Disposition: A | Discharge status: Home / Self Care | Payer: PPO | Source: Ambulatory Visit | Attending: Radiation Oncology | Admitting: Radiation Oncology

## 2021-10-26 ENCOUNTER — Other Ambulatory Visit: Payer: Self-pay

## 2021-10-26 DIAGNOSIS — Z87891 Personal history of nicotine dependence: Secondary | ICD-10-CM | POA: Diagnosis not present

## 2021-10-26 DIAGNOSIS — C3412 Malignant neoplasm of upper lobe, left bronchus or lung: Secondary | ICD-10-CM | POA: Diagnosis not present

## 2021-10-26 DIAGNOSIS — Z510 Encounter for antineoplastic radiation therapy: Secondary | ICD-10-CM | POA: Diagnosis not present

## 2021-10-26 NOTE — Telephone Encounter (Signed)
Pt called stating that he would like a call back. Pt states that he is on radiation and that he needs some oxygen.

## 2021-10-26 NOTE — Telephone Encounter (Signed)
Patient is requesting a small O2 tank. He states he is having a hard time getting around now when he gets up to do anything.

## 2021-10-27 NOTE — Telephone Encounter (Signed)
This is fine with me.  Who is addressing his O2 orders currently?  Which company is using?  Please let me know.  Thanks.

## 2021-10-28 ENCOUNTER — Ambulatory Visit: Payer: PPO | Admitting: Radiation Oncology

## 2021-10-28 ENCOUNTER — Ambulatory Visit
Admission: RE | Admit: 2021-10-28 | Discharge: 2021-10-28 | Disposition: A | Discharge status: Home / Self Care | Payer: PPO | Source: Ambulatory Visit | Attending: Radiation Oncology | Admitting: Radiation Oncology

## 2021-10-28 DIAGNOSIS — Z87891 Personal history of nicotine dependence: Secondary | ICD-10-CM | POA: Diagnosis not present

## 2021-10-28 DIAGNOSIS — C3412 Malignant neoplasm of upper lobe, left bronchus or lung: Secondary | ICD-10-CM | POA: Diagnosis not present

## 2021-10-28 DIAGNOSIS — Z510 Encounter for antineoplastic radiation therapy: Secondary | ICD-10-CM | POA: Diagnosis not present

## 2021-10-29 ENCOUNTER — Ambulatory Visit
Admission: RE | Admit: 2021-10-29 | Discharge: 2021-10-29 | Disposition: A | Discharge status: Home / Self Care | Payer: PPO | Source: Ambulatory Visit | Attending: Radiation Oncology | Admitting: Radiation Oncology

## 2021-10-29 ENCOUNTER — Other Ambulatory Visit: Payer: Self-pay | Admitting: Family Medicine

## 2021-10-29 ENCOUNTER — Ambulatory Visit: Payer: PPO | Admitting: Radiation Oncology

## 2021-10-29 ENCOUNTER — Other Ambulatory Visit: Payer: Self-pay

## 2021-10-29 DIAGNOSIS — Z510 Encounter for antineoplastic radiation therapy: Secondary | ICD-10-CM | POA: Diagnosis not present

## 2021-10-29 DIAGNOSIS — C3412 Malignant neoplasm of upper lobe, left bronchus or lung: Secondary | ICD-10-CM | POA: Diagnosis not present

## 2021-10-29 DIAGNOSIS — Z87891 Personal history of nicotine dependence: Secondary | ICD-10-CM | POA: Diagnosis not present

## 2021-10-30 ENCOUNTER — Ambulatory Visit
Admission: RE | Admit: 2021-10-30 | Discharge: 2021-10-30 | Disposition: A | Discharge status: Home / Self Care | Payer: PPO | Source: Ambulatory Visit | Attending: Radiation Oncology | Admitting: Radiation Oncology

## 2021-10-30 DIAGNOSIS — C3412 Malignant neoplasm of upper lobe, left bronchus or lung: Secondary | ICD-10-CM | POA: Diagnosis not present

## 2021-10-30 DIAGNOSIS — Z87891 Personal history of nicotine dependence: Secondary | ICD-10-CM | POA: Diagnosis not present

## 2021-10-30 DIAGNOSIS — Z510 Encounter for antineoplastic radiation therapy: Secondary | ICD-10-CM | POA: Diagnosis not present

## 2021-10-30 NOTE — Telephone Encounter (Signed)
I thought he needed a renewal, not a new start.    If this is a situation where he wasn't already on O2, then he'll need an OV with pulse ox testing at rest and walking with and without O2.  It will not be covered/delivered w/o testing.  If he is emergently SOB, then he needs eval/tx ASAP.  Thanks.

## 2021-10-30 NOTE — Telephone Encounter (Signed)
LMTCB

## 2021-10-31 ENCOUNTER — Ambulatory Visit: Payer: PPO | Admitting: Radiation Oncology

## 2021-11-01 ENCOUNTER — Ambulatory Visit: Payer: PPO | Admitting: Radiation Oncology

## 2021-11-02 ENCOUNTER — Ambulatory Visit: Payer: PPO | Admitting: Radiation Oncology

## 2021-11-05 ENCOUNTER — Ambulatory Visit: Payer: PPO | Admitting: Radiation Oncology

## 2021-11-06 ENCOUNTER — Ambulatory Visit: Payer: PPO | Admitting: Radiation Oncology

## 2021-11-06 NOTE — Telephone Encounter (Signed)
Patient aware he will need appt before we can order and he was okay with that. I have moved appt up to be seen on 11/13/21.

## 2021-11-13 ENCOUNTER — Ambulatory Visit (INDEPENDENT_AMBULATORY_CARE_PROVIDER_SITE_OTHER): Payer: PPO | Admitting: Family Medicine

## 2021-11-13 ENCOUNTER — Encounter: Payer: Self-pay | Admitting: Family Medicine

## 2021-11-13 ENCOUNTER — Other Ambulatory Visit: Payer: Self-pay

## 2021-11-13 VITALS — BP 130/72 | HR 77 | Temp 98.2°F | Ht 72.0 in | Wt 272.0 lb

## 2021-11-13 DIAGNOSIS — G8929 Other chronic pain: Secondary | ICD-10-CM | POA: Diagnosis not present

## 2021-11-13 DIAGNOSIS — C3412 Malignant neoplasm of upper lobe, left bronchus or lung: Secondary | ICD-10-CM

## 2021-11-13 DIAGNOSIS — E118 Type 2 diabetes mellitus with unspecified complications: Secondary | ICD-10-CM | POA: Diagnosis not present

## 2021-11-13 DIAGNOSIS — Z0000 Encounter for general adult medical examination without abnormal findings: Secondary | ICD-10-CM | POA: Diagnosis not present

## 2021-11-13 DIAGNOSIS — G4700 Insomnia, unspecified: Secondary | ICD-10-CM

## 2021-11-13 DIAGNOSIS — Z23 Encounter for immunization: Secondary | ICD-10-CM

## 2021-11-13 DIAGNOSIS — E785 Hyperlipidemia, unspecified: Secondary | ICD-10-CM | POA: Diagnosis not present

## 2021-11-13 DIAGNOSIS — R0602 Shortness of breath: Secondary | ICD-10-CM

## 2021-11-13 DIAGNOSIS — I10 Essential (primary) hypertension: Secondary | ICD-10-CM | POA: Diagnosis not present

## 2021-11-13 DIAGNOSIS — M545 Low back pain, unspecified: Secondary | ICD-10-CM

## 2021-11-13 DIAGNOSIS — Z7189 Other specified counseling: Secondary | ICD-10-CM

## 2021-11-13 LAB — CBC WITH DIFFERENTIAL/PLATELET
Basophils Absolute: 0.1 10*3/uL (ref 0.0–0.1)
Basophils Relative: 1.4 % (ref 0.0–3.0)
Eosinophils Absolute: 0.2 10*3/uL (ref 0.0–0.7)
Eosinophils Relative: 3.8 % (ref 0.0–5.0)
HCT: 36.9 % — ABNORMAL LOW (ref 39.0–52.0)
Hemoglobin: 12 g/dL — ABNORMAL LOW (ref 13.0–17.0)
Lymphocytes Relative: 12.7 % (ref 12.0–46.0)
Lymphs Abs: 0.8 10*3/uL (ref 0.7–4.0)
MCHC: 32.6 g/dL (ref 30.0–36.0)
MCV: 105.4 fl — ABNORMAL HIGH (ref 78.0–100.0)
Monocytes Absolute: 0.7 10*3/uL (ref 0.1–1.0)
Monocytes Relative: 10.5 % (ref 3.0–12.0)
Neutro Abs: 4.6 10*3/uL (ref 1.4–7.7)
Neutrophils Relative %: 71.6 % (ref 43.0–77.0)
Platelets: 144 10*3/uL — ABNORMAL LOW (ref 150.0–400.0)
RBC: 3.5 Mil/uL — ABNORMAL LOW (ref 4.22–5.81)
RDW: 15 % (ref 11.5–15.5)
WBC: 6.4 10*3/uL (ref 4.0–10.5)

## 2021-11-13 LAB — COMPREHENSIVE METABOLIC PANEL
ALT: 18 U/L (ref 0–53)
AST: 18 U/L (ref 0–37)
Albumin: 4.1 g/dL (ref 3.5–5.2)
Alkaline Phosphatase: 77 U/L (ref 39–117)
BUN: 20 mg/dL (ref 6–23)
CO2: 27 mEq/L (ref 19–32)
Calcium: 9.4 mg/dL (ref 8.4–10.5)
Chloride: 104 mEq/L (ref 96–112)
Creatinine, Ser: 1.24 mg/dL (ref 0.40–1.50)
GFR: 57.8 mL/min — ABNORMAL LOW (ref >60.00)
Glucose, Bld: 193 mg/dL — ABNORMAL HIGH (ref 70–99)
Potassium: 5 mEq/L (ref 3.5–5.1)
Sodium: 136 mEq/L (ref 135–145)
Total Bilirubin: 0.6 mg/dL (ref 0.2–1.2)
Total Protein: 7.1 g/dL (ref 6.0–8.3)

## 2021-11-13 LAB — LIPID PANEL
Cholesterol: 137 mg/dL (ref 0–200)
HDL: 39.6 mg/dL (ref >39.00)
LDL Cholesterol: 79 mg/dL (ref 0–99)
NonHDL: 97.01
Total CHOL/HDL Ratio: 3
Triglycerides: 88 mg/dL (ref 0.0–149.0)
VLDL: 17.6 mg/dL (ref 0.0–40.0)

## 2021-11-13 LAB — BRAIN NATRIURETIC PEPTIDE: Pro B Natriuretic peptide (BNP): 73 pg/mL (ref 0.0–100.0)

## 2021-11-13 LAB — HEMOGLOBIN A1C: Hgb A1c MFr Bld: 9.7 % — ABNORMAL HIGH (ref 4.6–6.5)

## 2021-11-13 MED ORDER — METFORMIN HCL 500 MG PO TABS: 500.0000 mg | ORAL_TABLET | Freq: Every day | ORAL | Status: DC

## 2021-11-13 MED ORDER — METOPROLOL SUCCINATE ER 50 MG PO TB24: 50.0000 mg | ORAL_TABLET | Freq: Every day | ORAL | 3 refills | Status: DC

## 2021-11-13 MED ORDER — CLONAZEPAM 0.5 MG PO TABS: 0.5000 mg | ORAL_TABLET | Freq: Every evening | ORAL | 1 refills | Status: DC | PRN

## 2021-11-13 MED ORDER — SIMVASTATIN 40 MG PO TABS: 40.0000 mg | ORAL_TABLET | Freq: Every day | ORAL | 3 refills | Status: AC

## 2021-11-13 MED ORDER — LISINOPRIL 10 MG PO TABS: 10.0000 mg | ORAL_TABLET | Freq: Every day | ORAL | 3 refills | Status: DC

## 2021-11-13 NOTE — Progress Notes (Signed)
This visit occurred during the SARS-CoV-2 public health emergency.  Safety protocols were in place, including screening questions prior to the visit, additional usage of staff PPE, and extensive cleaning of exam room while observing appropriate contact time as indicated for disinfecting solutions.  I have personally reviewed the Medicare Annual Wellness questionnaire and have noted 1. The patient's medical and social history 2. Their use of alcohol, tobacco or illicit drugs 3. Their current medications and supplements 4. The patient's functional ability including ADL's, fall risks, home safety risks and hearing or visual             impairment. 5. Diet and physical activities 6. Evidence for depression or mood disorders  The patients weight, height, BMI have been recorded in the chart and visual acuity is per eye clinic.  I have made referrals, counseling and provided education to the patient based review of the above and I have provided the pt with a written personalized care plan for preventive services.  Provider list updated- see scanned forms.  Routine anticipatory guidance given to patient.  See health maintenance. The possibility exists that previously documented standard health maintenance information may have been brought forward from a previous encounter into this note.  If needed, that same information has been updated to reflect the current situation based on today's encounter.    Flu 2022.   Shingles d/w pt.  PNA up to date Tetanus 2019 Covid vaccine prev done.  Colonoscopy deferred given other concerns, he agrees.  Prostate cancer screening deferred given other concerns, he agrees.  Advance directive d/w pt.  Would have his son, daughter and brother equally designated if patient were incapacitated.   Cognitive function addressed- see scanned forms- and if abnormal then additional documentation follows.   In addition to [facility] Wellness, follow up visit for the below  conditions:  Lung cancer.  Followed by Dr. Julien Nordmann with recent radiation treatment per Dr. Sondra Come.  SOB with walking, SOB walking 50 feet.  Exhausted after walking ~100 feet here in clinic.  Pulse ox stayed >93%.  He doesn't see much change with SABA.  He couldn't tolerate CPAP and had to stop.    Insomnia.  Klonopin used prn.  Used about 2-3 times per week.  It helps.  No ADE on med.   Using baclofen prn for spasms.  He fell in the dark when walking in his house.  Fall cautions d/w pt.  H/o T5 compression fx.  His back pain is overall improving.    Hypertension:    Using medication without problems or lightheadedness: yes Chest pain with exertion:no Edema: trace BLE edema at baseline.   Short of breath: yes, see above, with exertion  Diabetes:  Using medications without difficulties: no Hypoglycemic episodes: no sx Hyperglycemic episodes: no sx Feet problems: some mild numbness noted in the feet Blood Sugars averaging: not checked.   eye exam within last year: prev done.   Elevated Cholesterol: Using medications without problems: yes Muscle aches: no Diet compliance: d/w pt. He cut out some cards and sweets.  Exercise: limited by SOB.    PMH and SH reviewed  Meds, vitals, and allergies reviewed.   ROS: Per HPI.  Unless specifically indicated otherwise in HPI, the patient denies:  General: fever. Eyes: acute vision changes ENT: sore throat Cardiovascular: chest pain Respiratory: SOB GI: vomiting GU: dysuria Musculoskeletal: acute back pain Derm: acute rash Neuro: acute motor dysfunction Psych: worsening mood Endocrine: polydipsia Heme: bleeding Allergy: hayfever  GEN: nad, alert  and oriented HEENT: ncat NECK: supple w/o LA CV: rrr. PULM: ctab, no inc wob ABD: soft, +bs EXT: trace BLE edema SKIN: no acute rash  Diabetic foot exam: Normal inspection No skin breakdown No calluses  Normal DP pulses Normal sensation to light touch and monofilament Nails  normal

## 2021-11-13 NOTE — Patient Instructions (Signed)
Go to the lab on the way out.   If you have mychart we'll likely use that to update you.    Take care.  Glad to see you. We'll be in touch.

## 2021-11-14 DIAGNOSIS — Z0000 Encounter for general adult medical examination without abnormal findings: Secondary | ICD-10-CM | POA: Insufficient documentation

## 2021-11-14 NOTE — Assessment & Plan Note (Signed)
Continue baclofen.  Not sedated.  He will update me as needed.

## 2021-11-14 NOTE — Assessment & Plan Note (Signed)
Continue metformin.  See notes on labs.

## 2021-11-14 NOTE — Assessment & Plan Note (Signed)
  Advance directive d/w pt.  Would have his son, daughter and brother equally designated if patient were incapacitated.

## 2021-11-14 NOTE — Assessment & Plan Note (Signed)
Klonopin used prn.  Used about 2-3 times per week.  It helps.  No ADE on med.  Continue as is.

## 2021-11-14 NOTE — Assessment & Plan Note (Signed)
Continue simvastatin.  See notes on labs.

## 2021-11-14 NOTE — Assessment & Plan Note (Signed)
  Flu 2022.   Shingles d/w pt.  PNA up to date Tetanus 2019 Covid vaccine prev done.  Colonoscopy deferred given other concerns, he agrees.  Prostate cancer screening deferred given other concerns, he agrees.  Advance directive d/w pt.  Would have his son, daughter and brother equally designated if patient were incapacitated.   Cognitive function addressed- see scanned forms- and if abnormal then additional documentation follows.

## 2021-11-14 NOTE — Assessment & Plan Note (Signed)
Followed by Dr. Julien Nordmann with recent radiation treatment per Dr. Sondra Come.  SOB with walking, SOB walking 50 feet.  Exhausted after walking ~100 feet here in clinic.  Pulse ox stayed >93%.  He doesn't see much change with SABA.  He couldn't tolerate CPAP and had to stop.  We talked about working up for shortness of breath with labs today.  No wheeze.  I am uncertain if he will have significant benefit from supplemental oxygen.

## 2021-11-14 NOTE — Assessment & Plan Note (Signed)
See notes on labs.  Continue lisinopril metoprolol.

## 2021-11-20 ENCOUNTER — Other Ambulatory Visit: Payer: Self-pay | Admitting: Family Medicine

## 2021-11-20 ENCOUNTER — Telehealth: Payer: Self-pay | Admitting: Family Medicine

## 2021-11-20 DIAGNOSIS — R0602 Shortness of breath: Secondary | ICD-10-CM

## 2021-11-20 NOTE — Telephone Encounter (Signed)
Called and spoke with patient. See results note.

## 2021-11-20 NOTE — Telephone Encounter (Signed)
Pt called to check status of his lab work. Pt also states that he needs his oxygen. Please advise.

## 2021-11-22 ENCOUNTER — Encounter: Payer: PPO | Admitting: Family Medicine

## 2021-11-27 ENCOUNTER — Encounter: Payer: PPO | Admitting: Family Medicine

## 2021-12-07 ENCOUNTER — Encounter: Payer: Self-pay | Admitting: Radiation Oncology

## 2021-12-12 ENCOUNTER — Telehealth: Payer: Self-pay | Admitting: *Deleted

## 2021-12-12 NOTE — Telephone Encounter (Signed)
Called patient to ask about rescheduling tomorrow's fu, patient stated that he wants to wait a while and call me back to reschedule

## 2021-12-13 ENCOUNTER — Ambulatory Visit: Payer: PPO | Admitting: Radiation Oncology

## 2021-12-16 ENCOUNTER — Other Ambulatory Visit: Payer: Self-pay | Admitting: Family Medicine

## 2021-12-17 ENCOUNTER — Institutional Professional Consult (permissible substitution): Payer: PPO | Admitting: Pulmonary Disease

## 2021-12-24 ENCOUNTER — Encounter (HOSPITAL_COMMUNITY): Payer: Self-pay | Admitting: Emergency Medicine

## 2021-12-24 ENCOUNTER — Emergency Department (HOSPITAL_COMMUNITY): Payer: PPO

## 2021-12-24 ENCOUNTER — Other Ambulatory Visit: Payer: Self-pay

## 2021-12-24 ENCOUNTER — Telehealth: Payer: Self-pay

## 2021-12-24 ENCOUNTER — Emergency Department (HOSPITAL_COMMUNITY)
Admission: EM | Admit: 2021-12-24 | Discharge: 2021-12-24 | Disposition: A | Discharge status: Home / Self Care | Payer: PPO | Source: Home / Self Care

## 2021-12-24 DIAGNOSIS — R0609 Other forms of dyspnea: Secondary | ICD-10-CM | POA: Diagnosis not present

## 2021-12-24 DIAGNOSIS — R61 Generalized hyperhidrosis: Secondary | ICD-10-CM | POA: Diagnosis not present

## 2021-12-24 DIAGNOSIS — N1831 Chronic kidney disease, stage 3a: Secondary | ICD-10-CM | POA: Diagnosis present

## 2021-12-24 DIAGNOSIS — K219 Gastro-esophageal reflux disease without esophagitis: Secondary | ICD-10-CM | POA: Diagnosis present

## 2021-12-24 DIAGNOSIS — R231 Pallor: Secondary | ICD-10-CM | POA: Diagnosis not present

## 2021-12-24 DIAGNOSIS — G255 Other chorea: Secondary | ICD-10-CM | POA: Diagnosis present

## 2021-12-24 DIAGNOSIS — J811 Chronic pulmonary edema: Secondary | ICD-10-CM | POA: Diagnosis not present

## 2021-12-24 DIAGNOSIS — R778 Other specified abnormalities of plasma proteins: Secondary | ICD-10-CM | POA: Diagnosis not present

## 2021-12-24 DIAGNOSIS — I10 Essential (primary) hypertension: Secondary | ICD-10-CM | POA: Diagnosis not present

## 2021-12-24 DIAGNOSIS — R0689 Other abnormalities of breathing: Secondary | ICD-10-CM | POA: Diagnosis not present

## 2021-12-24 DIAGNOSIS — N179 Acute kidney failure, unspecified: Secondary | ICD-10-CM | POA: Diagnosis present

## 2021-12-24 DIAGNOSIS — I2510 Atherosclerotic heart disease of native coronary artery without angina pectoris: Secondary | ICD-10-CM | POA: Diagnosis present

## 2021-12-24 DIAGNOSIS — U071 COVID-19: Secondary | ICD-10-CM | POA: Insufficient documentation

## 2021-12-24 DIAGNOSIS — J80 Acute respiratory distress syndrome: Secondary | ICD-10-CM | POA: Diagnosis not present

## 2021-12-24 DIAGNOSIS — E1122 Type 2 diabetes mellitus with diabetic chronic kidney disease: Secondary | ICD-10-CM | POA: Diagnosis present

## 2021-12-24 DIAGNOSIS — M1990 Unspecified osteoarthritis, unspecified site: Secondary | ICD-10-CM | POA: Diagnosis present

## 2021-12-24 DIAGNOSIS — K5900 Constipation, unspecified: Secondary | ICD-10-CM | POA: Diagnosis not present

## 2021-12-24 DIAGNOSIS — Z5321 Procedure and treatment not carried out due to patient leaving prior to being seen by health care provider: Secondary | ICD-10-CM | POA: Insufficient documentation

## 2021-12-24 DIAGNOSIS — D539 Nutritional anemia, unspecified: Secondary | ICD-10-CM | POA: Diagnosis present

## 2021-12-24 DIAGNOSIS — J189 Pneumonia, unspecified organism: Secondary | ICD-10-CM | POA: Diagnosis not present

## 2021-12-24 DIAGNOSIS — F411 Generalized anxiety disorder: Secondary | ICD-10-CM | POA: Diagnosis present

## 2021-12-24 DIAGNOSIS — R7989 Other specified abnormal findings of blood chemistry: Secondary | ICD-10-CM | POA: Diagnosis present

## 2021-12-24 DIAGNOSIS — R0603 Acute respiratory distress: Secondary | ICD-10-CM | POA: Diagnosis not present

## 2021-12-24 DIAGNOSIS — I248 Other forms of acute ischemic heart disease: Secondary | ICD-10-CM | POA: Diagnosis present

## 2021-12-24 DIAGNOSIS — J329 Chronic sinusitis, unspecified: Secondary | ICD-10-CM | POA: Diagnosis not present

## 2021-12-24 DIAGNOSIS — J1282 Pneumonia due to coronavirus disease 2019: Secondary | ICD-10-CM | POA: Diagnosis present

## 2021-12-24 DIAGNOSIS — I517 Cardiomegaly: Secondary | ICD-10-CM | POA: Diagnosis not present

## 2021-12-24 DIAGNOSIS — E785 Hyperlipidemia, unspecified: Secondary | ICD-10-CM | POA: Diagnosis present

## 2021-12-24 DIAGNOSIS — E669 Obesity, unspecified: Secondary | ICD-10-CM | POA: Diagnosis present

## 2021-12-24 DIAGNOSIS — J9601 Acute respiratory failure with hypoxia: Secondary | ICD-10-CM | POA: Diagnosis not present

## 2021-12-24 DIAGNOSIS — G319 Degenerative disease of nervous system, unspecified: Secondary | ICD-10-CM | POA: Diagnosis not present

## 2021-12-24 DIAGNOSIS — J90 Pleural effusion, not elsewhere classified: Secondary | ICD-10-CM | POA: Diagnosis not present

## 2021-12-24 DIAGNOSIS — R0789 Other chest pain: Secondary | ICD-10-CM | POA: Diagnosis not present

## 2021-12-24 DIAGNOSIS — R1310 Dysphagia, unspecified: Secondary | ICD-10-CM | POA: Diagnosis not present

## 2021-12-24 DIAGNOSIS — R0602 Shortness of breath: Secondary | ICD-10-CM | POA: Diagnosis not present

## 2021-12-24 DIAGNOSIS — R29818 Other symptoms and signs involving the nervous system: Secondary | ICD-10-CM | POA: Diagnosis not present

## 2021-12-24 DIAGNOSIS — G4733 Obstructive sleep apnea (adult) (pediatric): Secondary | ICD-10-CM | POA: Diagnosis present

## 2021-12-24 DIAGNOSIS — E1165 Type 2 diabetes mellitus with hyperglycemia: Secondary | ICD-10-CM | POA: Diagnosis not present

## 2021-12-24 DIAGNOSIS — Z9989 Dependence on other enabling machines and devices: Secondary | ICD-10-CM | POA: Diagnosis not present

## 2021-12-24 DIAGNOSIS — R509 Fever, unspecified: Secondary | ICD-10-CM | POA: Diagnosis not present

## 2021-12-24 DIAGNOSIS — I6529 Occlusion and stenosis of unspecified carotid artery: Secondary | ICD-10-CM | POA: Diagnosis not present

## 2021-12-24 DIAGNOSIS — R0989 Other specified symptoms and signs involving the circulatory and respiratory systems: Secondary | ICD-10-CM | POA: Diagnosis not present

## 2021-12-24 DIAGNOSIS — I129 Hypertensive chronic kidney disease with stage 1 through stage 4 chronic kidney disease, or unspecified chronic kidney disease: Secondary | ICD-10-CM | POA: Diagnosis present

## 2021-12-24 DIAGNOSIS — R911 Solitary pulmonary nodule: Secondary | ICD-10-CM | POA: Diagnosis not present

## 2021-12-24 DIAGNOSIS — C7972 Secondary malignant neoplasm of left adrenal gland: Secondary | ICD-10-CM | POA: Diagnosis present

## 2021-12-24 DIAGNOSIS — R059 Cough, unspecified: Secondary | ICD-10-CM | POA: Diagnosis not present

## 2021-12-24 DIAGNOSIS — C7971 Secondary malignant neoplasm of right adrenal gland: Secondary | ICD-10-CM | POA: Diagnosis present

## 2021-12-24 DIAGNOSIS — F05 Delirium due to known physiological condition: Secondary | ICD-10-CM | POA: Diagnosis not present

## 2021-12-24 LAB — BASIC METABOLIC PANEL
Anion gap: 10 (ref 5–15)
BUN: 15 mg/dL (ref 8–23)
CO2: 23 mmol/L (ref 22–32)
Calcium: 8.9 mg/dL (ref 8.9–10.3)
Chloride: 104 mmol/L (ref 98–111)
Creatinine, Ser: 1.18 mg/dL (ref 0.61–1.24)
GFR, Estimated: >60 mL/min (ref >60)
Glucose, Bld: 146 mg/dL — ABNORMAL HIGH (ref 70–99)
Potassium: 4.3 mmol/L (ref 3.5–5.1)
Sodium: 137 mmol/L (ref 135–145)

## 2021-12-24 LAB — CBC WITH DIFFERENTIAL/PLATELET
Abs Immature Granulocytes: 0.02 10*3/uL (ref 0.00–0.07)
Basophils Absolute: 0 10*3/uL (ref 0.0–0.1)
Basophils Relative: 1 %
Eosinophils Absolute: 0.5 10*3/uL (ref 0.0–0.5)
Eosinophils Relative: 9 %
HCT: 35.8 % — ABNORMAL LOW (ref 39.0–52.0)
Hemoglobin: 11.4 g/dL — ABNORMAL LOW (ref 13.0–17.0)
Immature Granulocytes: 0 %
Lymphocytes Relative: 15 %
Lymphs Abs: 0.8 10*3/uL (ref 0.7–4.0)
MCH: 34.1 pg — ABNORMAL HIGH (ref 26.0–34.0)
MCHC: 31.8 g/dL (ref 30.0–36.0)
MCV: 107.2 fL — ABNORMAL HIGH (ref 80.0–100.0)
Monocytes Absolute: 0.8 10*3/uL (ref 0.1–1.0)
Monocytes Relative: 16 %
Neutro Abs: 3 10*3/uL (ref 1.7–7.7)
Neutrophils Relative %: 59 %
Platelets: 120 10*3/uL — ABNORMAL LOW (ref 150–400)
RBC: 3.34 MIL/uL — ABNORMAL LOW (ref 4.22–5.81)
RDW: 14.5 % (ref 11.5–15.5)
WBC: 5.1 10*3/uL (ref 4.0–10.5)
nRBC: 0.4 % — ABNORMAL HIGH (ref 0.0–0.2)

## 2021-12-24 LAB — RESP PANEL BY RT-PCR (FLU A&B, COVID) ARPGX2
Influenza A by PCR: NEGATIVE
Influenza B by PCR: NEGATIVE
SARS Coronavirus 2 by RT PCR: POSITIVE — AB

## 2021-12-24 LAB — TROPONIN I (HIGH SENSITIVITY)
Troponin I (High Sensitivity): 18 ng/L — ABNORMAL HIGH (ref <18)
Troponin I (High Sensitivity): 19 ng/L — ABNORMAL HIGH (ref <18)

## 2021-12-24 NOTE — Telephone Encounter (Signed)
Noted. Will await ER report.  Thanks.  °

## 2021-12-24 NOTE — ED Provider Triage Note (Signed)
Emergency Medicine Provider Triage Evaluation Note  Christian [name] , [name] 76 y.o. male  was evaluated in triage. Pt complains of shortness of breath x3 days.  Patient is followed by radiation oncologist, Dr. Filbert Schilder for lung cancer.  He has associated chest congestion, nasal congestion.  Has not tried medications for his symptoms.  Denies history of CHF, COPD, asthma.  Denies abdominal pain, nausea, vomiting, fever, chills.  Review of Systems  Positive: As per HPI above. Negative: Abdominal pain, nausea, vomiting  Physical Exam  BP (!) 151/78 (BP Location: Right Arm)    Pulse 80    Temp 97.8 F (36.6 C) (Oral)    Resp (!) 22    SpO2 97%  Gen:   Awake, no distress   Resp:  Normal effort  MSK:   Moves extremities without difficulty  Other:  No chest wall tenderness to palpation.  Medical Decision Making  Medically screening exam initiated at 5:16 PM.  Appropriate orders placed.  Christian [name] was informed that the remainder of the evaluation will be completed by another provider, this initial triage assessment does not replace that evaluation, and the importance of remaining in the ED until their evaluation is complete.   Christian Seybold A, PA-C 12/24/21 1719

## 2021-12-24 NOTE — ED Triage Notes (Signed)
Pt herefor covid/flu test. Pt reports feeling shob over last 4 days, w/ some chills & nausea.

## 2021-12-24 NOTE — Telephone Encounter (Signed)
Pt was advised to go to ED. I spoke with pt and he is waiting for daughter to arrive from [place] to take him to ED.  Forwarding as an FYI to PCP.   [facility] - Client TELEPHONE ADVICE RECORD [facility] Patient Name: Christian [name] Gender: Male DOB: Oct 12, 1946 Age: 76 Y 3 M 30 D Return Phone Number: [phone] (Primary), [phone] Zip: [place]  [address] Client [place] [facility] - Client Client Site [place] - Day Provider Renford Dills - MD Contact Type Call Who Is Calling Patient / Member / Family / Caregiver Call Type Triage / Clinical Caller Name Onnie Boer Relationship To Patient Daughter Return Phone Number [phone] (Primary) Chief Complaint BREATHING - shortness of breath or sounds breathless Reason for Call Symptomatic / Request for Health Information Initial Comment Office is transferring (lung cancer pt) with SOB, chest pain, and fever. Both pt and dtr are speaking on call. Mentions he suspects fever - has chills for 2 days, but no thermometer. Translation No Nurse Assessment Nurse: Leilani Merl, RN, Heather Date/Time (Eastern Time): 12/24/2021 8:30:11 AM Confirm and document reason for call. If symptomatic, describe symptoms. ---Office is transferring (lung cancer pt) with SOB, chest pain, and fever. Both pt and dtr are speaking on call. Mentions he suspects fever - has chills for 2 days, but no thermometer. He has head and chest congestion that started about 4 days ago. Does the patient have any new or worsening symptoms? ---Yes Will a triage be completed? ---Yes Related visit to physician within the last 2 weeks? ---No Does the PT have any chronic conditions? (i.e. diabetes, asthma, this includes High risk factors for pregnancy, etc.) ---Yes List chronic conditions. ---lung CA, DM Is this a behavioral health or substance abuse call? ---No Guidelines Guideline Title  Affirmed Question Affirmed Notes Nurse Date/Time (Eastern Time) Cough - Acute Productive [1] MODERATE difficulty breathing (e.g., speaks in phrases, SOB even at rest, pulse 100-120) Standifer, RN, Nira Conn 12/24/2021 8:32:43 AM PLEASE NOTE: All timestamps contained within this report are represented as [place] Standard Time. CONFIDENTIALTY NOTICE: This fax transmission is intended only for the addressee. It contains information that is legally privileged, confidential or otherwise protected from use or disclosure. If you are not the intended recipient, you are strictly prohibited from reviewing, disclosing, copying using or disseminating any of this information or taking any action in reliance on or regarding this information. If you have received this fax in error, please notify us immediately by telephone so that we can arrange for its return to [place]. Phone: [phone], Toll-Free: [phone], Fax: [phone] Page: 2 of 2 Call Id: [id number] Guidelines Guideline Title Affirmed Question Affirmed Notes Nurse Date/Time Eilene Ghazi Time) AND [2] still present when not coughing Disp. Time Eilene Ghazi Time) Disposition Final User 12/24/2021 8:26:38 AM Send to Urgent Vassie Loll 12/24/2021 8:34:27 AM Go to ED Now Yes Standifer, RN, Soyla Murphy Disagree/Comply Comply Caller Understands Yes PreDisposition Call Doctor Care Advice Given Per Guideline GO TO ED NOW: * You need to be seen in the Emergency Department. CARE ADVICE given per Cough - Acute Productive (Adult) guideline. Referrals [facility] - ED

## 2021-12-24 NOTE — ED Notes (Signed)
Pt stated he knew he was the longest wait and did not want to wait any longer, pt was advised to stay but refused

## 2021-12-25 NOTE — Telephone Encounter (Signed)
Spoke with patient's daughter about below. Zandra Abts if he is getting worse SOB or any CP then he needs to go back to the ER. She states patient does fine when sitting but SOB gets worse when he gets up and walks around. Visit has been changed to [place] for 12/27/21 at 11 am.

## 2021-12-25 NOTE — Telephone Encounter (Signed)
Pt daughter called wanting to get lab results from the ED yesterday, they waited for over 8hrs and left  Pt daughter scheduled him an appt for 1/19 wants to know if this can be in-person or VV   Please call Erasmo Downer [phone]

## 2021-12-25 NOTE — Telephone Encounter (Signed)
If more SOB or if any CP, then to ER.  Covid positive.  That would explain the findings below.    CXR with trace right pleural effusion versus chronic pleural thickening.  Chronic changes w/o acute changes.  The suspicious lingular nodule seen on previous CT not well visualized by x-ray.  Troponin mildly elevated, could be from cardiac strain.    Needs VV re: covid if not going back to UC vs ER.  Thanks.

## 2021-12-26 ENCOUNTER — Emergency Department (HOSPITAL_COMMUNITY): Payer: PPO

## 2021-12-26 ENCOUNTER — Inpatient Hospital Stay (HOSPITAL_COMMUNITY)
Admission: EM | Admit: 2021-12-26 | Discharge: 2022-01-04 | DRG: 177 | Disposition: A | Discharge status: Home Health | Payer: PPO | Attending: Internal Medicine | Admitting: Internal Medicine

## 2021-12-26 DIAGNOSIS — D539 Nutritional anemia, unspecified: Secondary | ICD-10-CM | POA: Diagnosis present

## 2021-12-26 DIAGNOSIS — N179 Acute kidney failure, unspecified: Secondary | ICD-10-CM | POA: Diagnosis present

## 2021-12-26 DIAGNOSIS — C7971 Secondary malignant neoplasm of right adrenal gland: Secondary | ICD-10-CM | POA: Diagnosis present

## 2021-12-26 DIAGNOSIS — F05 Delirium due to known physiological condition: Secondary | ICD-10-CM | POA: Diagnosis not present

## 2021-12-26 DIAGNOSIS — R911 Solitary pulmonary nodule: Secondary | ICD-10-CM | POA: Diagnosis not present

## 2021-12-26 DIAGNOSIS — C7972 Secondary malignant neoplasm of left adrenal gland: Secondary | ICD-10-CM | POA: Diagnosis present

## 2021-12-26 DIAGNOSIS — E1165 Type 2 diabetes mellitus with hyperglycemia: Secondary | ICD-10-CM | POA: Diagnosis not present

## 2021-12-26 DIAGNOSIS — R0609 Other forms of dyspnea: Secondary | ICD-10-CM | POA: Diagnosis present

## 2021-12-26 DIAGNOSIS — Z902 Acquired absence of lung [part of]: Secondary | ICD-10-CM

## 2021-12-26 DIAGNOSIS — J1282 Pneumonia due to coronavirus disease 2019: Secondary | ICD-10-CM | POA: Diagnosis present

## 2021-12-26 DIAGNOSIS — G319 Degenerative disease of nervous system, unspecified: Secondary | ICD-10-CM | POA: Diagnosis not present

## 2021-12-26 DIAGNOSIS — Z7984 Long term (current) use of oral hypoglycemic drugs: Secondary | ICD-10-CM

## 2021-12-26 DIAGNOSIS — Z85118 Personal history of other malignant neoplasm of bronchus and lung: Secondary | ICD-10-CM

## 2021-12-26 DIAGNOSIS — Z841 Family history of disorders of kidney and ureter: Secondary | ICD-10-CM

## 2021-12-26 DIAGNOSIS — K5900 Constipation, unspecified: Secondary | ICD-10-CM | POA: Diagnosis not present

## 2021-12-26 DIAGNOSIS — G255 Other chorea: Secondary | ICD-10-CM | POA: Diagnosis present

## 2021-12-26 DIAGNOSIS — R0689 Other abnormalities of breathing: Secondary | ICD-10-CM | POA: Diagnosis not present

## 2021-12-26 DIAGNOSIS — Z8619 Personal history of other infectious and parasitic diseases: Secondary | ICD-10-CM

## 2021-12-26 DIAGNOSIS — J189 Pneumonia, unspecified organism: Secondary | ICD-10-CM | POA: Diagnosis not present

## 2021-12-26 DIAGNOSIS — F411 Generalized anxiety disorder: Secondary | ICD-10-CM | POA: Diagnosis present

## 2021-12-26 DIAGNOSIS — N1831 Chronic kidney disease, stage 3a: Secondary | ICD-10-CM | POA: Diagnosis present

## 2021-12-26 DIAGNOSIS — G4733 Obstructive sleep apnea (adult) (pediatric): Secondary | ICD-10-CM | POA: Diagnosis present

## 2021-12-26 DIAGNOSIS — Z800 Family history of malignant neoplasm of digestive organs: Secondary | ICD-10-CM

## 2021-12-26 DIAGNOSIS — E669 Obesity, unspecified: Secondary | ICD-10-CM | POA: Diagnosis present

## 2021-12-26 DIAGNOSIS — Z923 Personal history of irradiation: Secondary | ICD-10-CM

## 2021-12-26 DIAGNOSIS — J9601 Acute respiratory failure with hypoxia: Secondary | ICD-10-CM | POA: Diagnosis not present

## 2021-12-26 DIAGNOSIS — R778 Other specified abnormalities of plasma proteins: Secondary | ICD-10-CM | POA: Diagnosis present

## 2021-12-26 DIAGNOSIS — U071 COVID-19: Principal | ICD-10-CM

## 2021-12-26 DIAGNOSIS — J80 Acute respiratory distress syndrome: Secondary | ICD-10-CM | POA: Diagnosis not present

## 2021-12-26 DIAGNOSIS — R1310 Dysphagia, unspecified: Secondary | ICD-10-CM | POA: Diagnosis not present

## 2021-12-26 DIAGNOSIS — M1990 Unspecified osteoarthritis, unspecified site: Secondary | ICD-10-CM | POA: Diagnosis present

## 2021-12-26 DIAGNOSIS — E1122 Type 2 diabetes mellitus with diabetic chronic kidney disease: Secondary | ICD-10-CM | POA: Diagnosis present

## 2021-12-26 DIAGNOSIS — R7989 Other specified abnormal findings of blood chemistry: Secondary | ICD-10-CM | POA: Diagnosis present

## 2021-12-26 DIAGNOSIS — Z87891 Personal history of nicotine dependence: Secondary | ICD-10-CM

## 2021-12-26 DIAGNOSIS — Z8249 Family history of ischemic heart disease and other diseases of the circulatory system: Secondary | ICD-10-CM

## 2021-12-26 DIAGNOSIS — Z833 Family history of diabetes mellitus: Secondary | ICD-10-CM

## 2021-12-26 DIAGNOSIS — R0602 Shortness of breath: Secondary | ICD-10-CM | POA: Diagnosis present

## 2021-12-26 DIAGNOSIS — R0789 Other chest pain: Secondary | ICD-10-CM | POA: Diagnosis not present

## 2021-12-26 DIAGNOSIS — I10 Essential (primary) hypertension: Secondary | ICD-10-CM | POA: Diagnosis present

## 2021-12-26 DIAGNOSIS — K219 Gastro-esophageal reflux disease without esophagitis: Secondary | ICD-10-CM | POA: Diagnosis present

## 2021-12-26 DIAGNOSIS — I6529 Occlusion and stenosis of unspecified carotid artery: Secondary | ICD-10-CM | POA: Diagnosis not present

## 2021-12-26 DIAGNOSIS — I129 Hypertensive chronic kidney disease with stage 1 through stage 4 chronic kidney disease, or unspecified chronic kidney disease: Secondary | ICD-10-CM | POA: Diagnosis present

## 2021-12-26 DIAGNOSIS — I2510 Atherosclerotic heart disease of native coronary artery without angina pectoris: Secondary | ICD-10-CM | POA: Diagnosis present

## 2021-12-26 DIAGNOSIS — Z8042 Family history of malignant neoplasm of prostate: Secondary | ICD-10-CM

## 2021-12-26 DIAGNOSIS — R61 Generalized hyperhidrosis: Secondary | ICD-10-CM | POA: Diagnosis not present

## 2021-12-26 DIAGNOSIS — Z9989 Dependence on other enabling machines and devices: Secondary | ICD-10-CM | POA: Diagnosis not present

## 2021-12-26 DIAGNOSIS — J811 Chronic pulmonary edema: Secondary | ICD-10-CM | POA: Diagnosis not present

## 2021-12-26 DIAGNOSIS — T380X5A Adverse effect of glucocorticoids and synthetic analogues, initial encounter: Secondary | ICD-10-CM | POA: Diagnosis not present

## 2021-12-26 DIAGNOSIS — E785 Hyperlipidemia, unspecified: Secondary | ICD-10-CM | POA: Diagnosis present

## 2021-12-26 DIAGNOSIS — Z86010 Personal history of colonic polyps: Secondary | ICD-10-CM

## 2021-12-26 DIAGNOSIS — R0603 Acute respiratory distress: Secondary | ICD-10-CM

## 2021-12-26 DIAGNOSIS — J329 Chronic sinusitis, unspecified: Secondary | ICD-10-CM | POA: Diagnosis not present

## 2021-12-26 DIAGNOSIS — Z79899 Other long term (current) drug therapy: Secondary | ICD-10-CM

## 2021-12-26 DIAGNOSIS — R0989 Other specified symptoms and signs involving the circulatory and respiratory systems: Secondary | ICD-10-CM | POA: Diagnosis not present

## 2021-12-26 DIAGNOSIS — I517 Cardiomegaly: Secondary | ICD-10-CM | POA: Diagnosis not present

## 2021-12-26 DIAGNOSIS — R231 Pallor: Secondary | ICD-10-CM | POA: Diagnosis not present

## 2021-12-26 DIAGNOSIS — J90 Pleural effusion, not elsewhere classified: Secondary | ICD-10-CM | POA: Diagnosis not present

## 2021-12-26 DIAGNOSIS — Z886 Allergy status to analgesic agent status: Secondary | ICD-10-CM

## 2021-12-26 DIAGNOSIS — I248 Other forms of acute ischemic heart disease: Secondary | ICD-10-CM | POA: Diagnosis present

## 2021-12-26 DIAGNOSIS — R0600 Dyspnea, unspecified: Secondary | ICD-10-CM

## 2021-12-26 DIAGNOSIS — Z6836 Body mass index (BMI) 36.0-36.9, adult: Secondary | ICD-10-CM

## 2021-12-26 DIAGNOSIS — R29818 Other symptoms and signs involving the nervous system: Secondary | ICD-10-CM | POA: Diagnosis not present

## 2021-12-26 LAB — CBC WITH DIFFERENTIAL/PLATELET
Abs Immature Granulocytes: 0.03 10*3/uL (ref 0.00–0.07)
Basophils Absolute: 0 10*3/uL (ref 0.0–0.1)
Basophils Relative: 0 %
Eosinophils Absolute: 0.5 10*3/uL (ref 0.0–0.5)
Eosinophils Relative: 7 %
HCT: 34.7 % — ABNORMAL LOW (ref 39.0–52.0)
Hemoglobin: 11.4 g/dL — ABNORMAL LOW (ref 13.0–17.0)
Immature Granulocytes: 0 %
Lymphocytes Relative: 11 %
Lymphs Abs: 0.9 10*3/uL (ref 0.7–4.0)
MCH: 34.9 pg — ABNORMAL HIGH (ref 26.0–34.0)
MCHC: 32.9 g/dL (ref 30.0–36.0)
MCV: 106.1 fL — ABNORMAL HIGH (ref 80.0–100.0)
Monocytes Absolute: 0.8 10*3/uL (ref 0.1–1.0)
Monocytes Relative: 10 %
Neutro Abs: 5.6 10*3/uL (ref 1.7–7.7)
Neutrophils Relative %: 72 %
Platelets: 127 10*3/uL — ABNORMAL LOW (ref 150–400)
RBC: 3.27 MIL/uL — ABNORMAL LOW (ref 4.22–5.81)
RDW: 14.6 % (ref 11.5–15.5)
WBC: 7.9 10*3/uL (ref 4.0–10.5)
nRBC: 0 % (ref 0.0–0.2)

## 2021-12-26 LAB — PROCALCITONIN: Procalcitonin: <0.1 ng/mL

## 2021-12-26 LAB — COMPREHENSIVE METABOLIC PANEL
ALT: 25 U/L (ref 0–44)
AST: 32 U/L (ref 15–41)
Albumin: 3.8 g/dL (ref 3.5–5.0)
Alkaline Phosphatase: 67 U/L (ref 38–126)
Anion gap: 7 (ref 5–15)
BUN: 20 mg/dL (ref 8–23)
CO2: 23 mmol/L (ref 22–32)
Calcium: 8.6 mg/dL — ABNORMAL LOW (ref 8.9–10.3)
Chloride: 106 mmol/L (ref 98–111)
Creatinine, Ser: 1.45 mg/dL — ABNORMAL HIGH (ref 0.61–1.24)
GFR, Estimated: 51 mL/min — ABNORMAL LOW (ref >60)
Glucose, Bld: 148 mg/dL — ABNORMAL HIGH (ref 70–99)
Potassium: 4.6 mmol/L (ref 3.5–5.1)
Sodium: 136 mmol/L (ref 135–145)
Total Bilirubin: 0.7 mg/dL (ref 0.3–1.2)
Total Protein: 7.1 g/dL (ref 6.5–8.1)

## 2021-12-26 LAB — TROPONIN I (HIGH SENSITIVITY)
Troponin I (High Sensitivity): 79 ng/L — ABNORMAL HIGH (ref <18)
Troponin I (High Sensitivity): 77 ng/L — ABNORMAL HIGH (ref <18)
Troponin I (High Sensitivity): 71 ng/L — ABNORMAL HIGH (ref <18)

## 2021-12-26 LAB — BLOOD GAS, VENOUS
Acid-base deficit: 4 mmol/L — ABNORMAL HIGH (ref 0.0–2.0)
Bicarbonate: 22.4 mmol/L (ref 20.0–28.0)
FIO2: 21
O2 Saturation: 91.2 %
Patient temperature: 98.6
pCO2, Ven: 49.4 mmHg (ref 44.0–60.0)
pH, Ven: 7.279 (ref 7.250–7.430)
pO2, Ven: 71 mmHg — ABNORMAL HIGH (ref 32.0–45.0)

## 2021-12-26 LAB — LACTATE DEHYDROGENASE: LDH: 163 U/L (ref 98–192)

## 2021-12-26 LAB — D-DIMER, QUANTITATIVE: D-Dimer, Quant: 1.17 ug/mL-FEU — ABNORMAL HIGH (ref 0.00–0.50)

## 2021-12-26 LAB — FERRITIN: Ferritin: 922 ng/mL — ABNORMAL HIGH (ref 24–336)

## 2021-12-26 LAB — VITAMIN B12: Vitamin B-12: 414 pg/mL (ref 180–914)

## 2021-12-26 LAB — TSH: TSH: 1.286 u[IU]/mL (ref 0.350–4.500)

## 2021-12-26 LAB — FOLATE: Folate: 14 ng/mL (ref >5.9)

## 2021-12-26 LAB — C-REACTIVE PROTEIN: CRP: 5 mg/dL — ABNORMAL HIGH (ref <1.0)

## 2021-12-26 LAB — BRAIN NATRIURETIC PEPTIDE: B Natriuretic Peptide: 69.9 pg/mL (ref 0.0–100.0)

## 2021-12-26 LAB — FIBRINOGEN: Fibrinogen: 550 mg/dL — ABNORMAL HIGH (ref 210–475)

## 2021-12-26 MED ORDER — SODIUM CHLORIDE 0.9 % IV SOLN
100.0000 mg | Freq: Every day | INTRAVENOUS | Status: AC
  Administered 2021-12-27 – 2021-12-30 (×4): 100 mg via INTRAVENOUS
  Filled 2021-12-26 (×4): qty 20

## 2021-12-26 MED ORDER — GUAIFENESIN-DM 100-10 MG/5ML PO SYRP
10.0000 mL | ORAL_SOLUTION | ORAL | Status: DC | PRN
  Administered 2021-12-29 – 2022-01-01 (×7): 10 mL via ORAL
  Filled 2021-12-26 (×7): qty 10

## 2021-12-26 MED ORDER — ALBUTEROL SULFATE HFA 108 (90 BASE) MCG/ACT IN AERS
2.0000 | INHALATION_SPRAY | RESPIRATORY_TRACT | Status: DC | PRN
  Administered 2021-12-31 – 2022-01-01 (×2): 2 via RESPIRATORY_TRACT
  Filled 2021-12-26: qty 6.7

## 2021-12-26 MED ORDER — HEPARIN SODIUM (PORCINE) 5000 UNIT/ML IJ SOLN
5000.0000 [IU] | Freq: Three times a day (TID) | INTRAMUSCULAR | Status: DC
  Administered 2021-12-26 – 2022-01-04 (×27): 5000 [IU] via SUBCUTANEOUS
  Filled 2021-12-26 (×27): qty 1

## 2021-12-26 MED ORDER — METOPROLOL SUCCINATE ER 50 MG PO TB24
50.0000 mg | ORAL_TABLET | Freq: Every day | ORAL | Status: DC
  Administered 2021-12-26 – 2021-12-27 (×2): 50 mg via ORAL
  Filled 2021-12-26 (×2): qty 1

## 2021-12-26 MED ORDER — LABETALOL HCL 5 MG/ML IV SOLN: 10.0000 mg | INTRAVENOUS | Status: DC | PRN

## 2021-12-26 MED ORDER — CLONAZEPAM 0.5 MG PO TABS: 0.5000 mg | ORAL_TABLET | Freq: Every evening | ORAL | Status: DC | PRN

## 2021-12-26 MED ORDER — ACETAMINOPHEN 325 MG PO TABS: 650.0000 mg | ORAL_TABLET | ORAL | Status: DC | PRN

## 2021-12-26 MED ORDER — IPRATROPIUM-ALBUTEROL 20-100 MCG/ACT IN AERS
1.0000 | INHALATION_SPRAY | Freq: Four times a day (QID) | RESPIRATORY_TRACT | Status: DC
  Administered 2021-12-26 – 2021-12-27 (×2): 1 via RESPIRATORY_TRACT
  Filled 2021-12-26: qty 4

## 2021-12-26 MED ORDER — SODIUM CHLORIDE 0.9% FLUSH
3.0000 mL | Freq: Two times a day (BID) | INTRAVENOUS | Status: DC
  Administered 2021-12-26 – 2021-12-31 (×11): 3 mL via INTRAVENOUS

## 2021-12-26 MED ORDER — HYDROCOD POLST-CPM POLST ER 10-8 MG/5ML PO SUER
5.0000 mL | Freq: Two times a day (BID) | ORAL | Status: DC | PRN
  Filled 2021-12-26: qty 5

## 2021-12-26 MED ORDER — SODIUM CHLORIDE 0.9 % IV SOLN: INTRAVENOUS | Status: AC

## 2021-12-26 MED ORDER — BACLOFEN 20 MG PO TABS
20.0000 mg | ORAL_TABLET | Freq: Two times a day (BID) | ORAL | Status: DC | PRN
  Administered 2021-12-26: 20 mg via ORAL
  Filled 2021-12-26: qty 1

## 2021-12-26 MED ORDER — SODIUM CHLORIDE 0.9 % IV SOLN
200.0000 mg | Freq: Once | INTRAVENOUS | Status: AC
  Administered 2021-12-26: 200 mg via INTRAVENOUS
  Filled 2021-12-26: qty 40

## 2021-12-26 MED ORDER — HYDRALAZINE HCL 25 MG PO TABS
25.0000 mg | ORAL_TABLET | ORAL | Status: DC | PRN
  Administered 2022-01-04: 25 mg via ORAL
  Filled 2021-12-26: qty 1

## 2021-12-26 MED ORDER — CLONAZEPAM 0.5 MG PO TABS
0.5000 mg | ORAL_TABLET | Freq: Two times a day (BID) | ORAL | Status: DC | PRN
  Administered 2021-12-26 – 2021-12-28 (×5): 0.5 mg via ORAL
  Filled 2021-12-26 (×5): qty 1

## 2021-12-26 NOTE — ED Provider Notes (Signed)
[facility] DEPT Provider Note   CSN: [id number] Arrival date & time: 12/26/21  0859     History  Chief Complaint  Patient presents with   Shortness of Breath    Christian [name] is a 76 y.o. male.  HPI He presents for evaluation of trouble breathing, with cough and congestion for 5 days.  He has had COVID vaccines and boosters.  Most of history is from the patient's daughter who is at the bedside.  He has ongoing pulmonary problems, which are unclear.  He is due to see a pulmonologist for evaluation later this month.  He came to the ED, 2 days ago, but left before evaluation.  Testing done at that time indicated COVID-positive, and mild troponin elevation.  He is not having active chest pain.  He is mostly concerned about chest congestion.  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    Home Medications Prior to Admission medications   Medication Sig Start Date End Date Taking? Authorizing Provider  albuterol (VENTOLIN HFA) 108 (90 Base) MCG/ACT inhaler Inhale 1-2 puffs into the lungs every 6 (six) hours as needed for wheezing or shortness of breath. 07/11/21  Yes [name], MD  aspirin 325 MG tablet Take 162.5 mg by mouth daily.   Yes [provider]  baclofen (LIORESAL) 20 MG tablet TAKE 1 TABLET (20 MG TOTAL) BY MOUTH 2 (TWO) TIMES DAILY AS NEEDED FOR MUSCLE SPASMS. Patient taking differently: Take 40 mg by mouth at bedtime. 10/30/21  Yes [name], MD  clonazePAM (KLONOPIN) 0.5 MG tablet Take 1 tablet (0.5 mg total) by mouth at bedtime as needed. For insomnia 11/13/21  Yes [name], MD  diphenhydrAMINE (BENADRYL) 25 mg capsule Take 75 mg by mouth at bedtime.   Yes [provider]  lisinopril (ZESTRIL) 10 MG tablet Take 1 tablet (10 mg total) by mouth daily. 11/13/21  Yes [name], MD  Melatonin 10 MG TABS Take 10 mg by mouth at bedtime.     Yes [provider]  metFORMIN (GLUCOPHAGE) 500 MG tablet TAKE 1 TABLET BY MOUTH EVERY DAY WITH BREAKFAST Patient taking differently: Take 500 mg by mouth 2 (two) times daily with a meal. 12/17/21  Yes [name], MD  metoprolol succinate (TOPROL-XL) 50 MG 24 hr tablet Take 1 tablet (50 mg total) by mouth daily. Take with or immediately following a meal. Patient taking differently: Take 50 mg by mouth at bedtime. Take with or immediately following a meal. 11/13/21  Yes [name], MD  nitroGLYCERIN (NITROSTAT) 0.4 MG SL tablet Place 1 tablet (0.4 mg total) under the tongue every 5 (five) minutes as needed for chest pain. 05/07/16  Yes Arbutus Leas, NP  simvastatin (ZOCOR) 40 MG tablet Take 1 tablet (40 mg total) by mouth at bedtime. 11/13/21  Yes [name], MD  vitamin E 100 UNIT capsule Take 100 Units by mouth daily.   Yes [provider]      Allergies    Celebrex [celecoxib]    Review of Systems   Review of Systems  Physical Exam Updated Vital Signs BP (!) 129/112    Pulse 80    Temp 98.1 F (36.7 C) (Oral)    Resp 17    Ht 6' (1.829 m)    Wt 122.5 kg    SpO2 97%    BMI 36.62 kg/m  Physical Exam Vitals and nursing note reviewed.  Constitutional:      General: He is in acute distress.     Appearance: He is well-developed. He is obese. He is ill-appearing. He is not toxic-appearing or diaphoretic.  HENT:     Head: Normocephalic and atraumatic.     Right Ear: External ear normal.     Left Ear: External ear normal.  Eyes:     Conjunctiva/sclera: Conjunctivae normal.     Pupils: Pupils are equal, round, and reactive to light.  Neck:     Trachea: Phonation normal.  Cardiovascular:     Rate and Rhythm: Normal rate and regular rhythm.     Heart sounds: Normal heart sounds.  Pulmonary:     Effort: Pulmonary effort is normal. No respiratory distress.     Breath sounds: No stridor. Rhonchi present. No wheezing.     Comments: Decreased air movement  bilaterally Abdominal:     General: There is no distension.     Palpations: Abdomen is soft.     Tenderness: There is no abdominal tenderness.  Musculoskeletal:        General: Normal range of motion.     Cervical back: Normal range of motion and neck supple.     Right lower leg: No edema.     Left lower leg: No edema.  Skin:    General: Skin is warm and dry.  Neurological:     Mental Status: He is alert and oriented to person, place, and time.     Cranial Nerves: No cranial nerve deficit.     Sensory: No sensory deficit.     Motor: No abnormal muscle tone.     Coordination: Coordination normal.  Psychiatric:        Mood and Affect: Mood normal.        Behavior: Behavior normal.    ED Results / Procedures / Treatments   Labs (all labs ordered are listed, but only abnormal results are displayed) Labs Reviewed  COMPREHENSIVE METABOLIC PANEL - Abnormal; Notable for the following components:      Result Value   Glucose, Bld 148 (*)    Creatinine, Ser 1.45 (*)    Calcium 8.6 (*)    GFR, Estimated 51 (*)    All other components within normal limits  CBC WITH DIFFERENTIAL/PLATELET - Abnormal; Notable for the following components:   RBC 3.27 (*)    Hemoglobin 11.4 (*)    HCT 34.7 (*)    MCV 106.1 (*)    MCH 34.9 (*)    Platelets 127 (*)    All other components within normal limits  BLOOD GAS, VENOUS - Abnormal; Notable for the following components:   pO2, Ven 71.0 (*)    Acid-base deficit 4.0 (*)    All other components within normal limits  TROPONIN I (HIGH SENSITIVITY) - Abnormal; Notable for the following components:   Troponin I (High Sensitivity) 79 (*)    All other components within normal limits  TROPONIN I (HIGH SENSITIVITY)    EKG EKG Interpretation  Date/Time:  Wednesday December 26 2021 09:39:57 EST Ventricular Rate:  82 PR Interval:  220 QRS Duration: 126 QT Interval:  410 QTC Calculation: 479 R Axis:   -54 Text Interpretation: Sinus rhythm Prolonged  PR interval Nonspecific IVCD with LAD Nonspecific T abnormalities, lateral leads Since last tracing PR interval is longer and nonspecific ST abnormality is new Confirmed by Daleen Bo [phone]) on 12/26/2021 9:57:16 AM  Radiology DG Chest 2  View  Result Date: 12/26/2021 CLINICAL DATA:  Shortness of breath EXAM: CHEST - 2 VIEW COMPARISON:  12/24/2021 FINDINGS: Volume loss right hemithorax. No new consolidation or edema. Chronic blunting right costophrenic angle. Stable cardiomediastinal contours. IMPRESSION: No acute process in the chest. Electronically Signed   By: Macy Mis M.D.   On: 12/26/2021 10:25   DG Chest 2 View  Result Date: 12/24/2021 CLINICAL DATA:  Cough and congestion, shortness of breath for 4 days, fever EXAM: CHEST - 2 VIEW COMPARISON:  07/11/2021, 09/24/2021 FINDINGS: Frontal and lateral views of the chest demonstrate a stable cardiac silhouette. Diffuse increased interstitial prominence consistent with scarring. Volume loss right lung base consistent with prior right lower lobe resection. No acute airspace disease. Chronic right pleural thickening versus trace right effusion. No pneumothorax. The lingular nodule seen on recent CT is not readily apparent on this exam. No acute bony abnormalities. IMPRESSION: 1. Trace right pleural effusion versus chronic pleural thickening. 2. Chronic interstitial scarring without acute airspace disease. 3. The suspicious lingular nodule seen on previous CT not well visualized by x-ray. Electronically Signed   By: Randa Ngo M.D.   On: 12/24/2021 17:51    Procedures Procedures    Medications Ordered in ED Medications  albuterol (VENTOLIN HFA) 108 (90 Base) MCG/ACT inhaler 2 puff (has no administration in time range)  remdesivir 200 mg in sodium chloride 0.9% 250 mL IVPB (has no administration in time range)    Followed by  remdesivir 100 mg in sodium chloride 0.9 % 100 mL IVPB (has no administration in time range)    ED Course/  Medical Decision Making/ A&P                           Medical Decision Making Amount and/or Complexity of Data Reviewed Labs: ordered.    Patient Vitals for the past 24 hrs:  BP Temp Temp src Pulse Resp SpO2 Height Weight  12/26/21 1230 (!) 129/112 -- -- 80 17 97 % -- --  12/26/21 1109 (!) 131/103 -- -- (!) 112 15 93 % -- --  12/26/21 1100 (!) 131/103 -- -- (!) 112 15 93 % -- --  12/26/21 1045 -- -- -- 85 (!) 31 99 % -- --  12/26/21 1030 96/84 -- -- 83 14 99 % -- --  12/26/21 1015 -- -- -- 82 (!) 27 98 % -- --  12/26/21 1000 -- -- -- 85 (!) 25 95 % -- --  12/26/21 0945 -- -- -- 72 (!) 25 99 % -- --  12/26/21 0935 114/90 -- -- -- (!) 28 -- -- --  12/26/21 0934 -- -- -- -- -- -- 6' (1.829 m) 122.5 kg  12/26/21 0930 96/75 -- -- 78 -- 94 % -- --  12/26/21 0926 -- 98.1 F (36.7 C) Oral 98 (!) 25 95 % -- --     Medical Decision Making: Summary of Illness/Injury: Patient presenting for evaluation of known COVID status with worsening condition despite symptomatic treatment at home.  He has had COVID vaccines and posterior.  No recent immunization.  Critical Interventions-clinic evaluation, laboratory testing, radiography, oxygen titration, observation and reassessment; to evaluate  Chief Complaint  Patient presents with   Shortness of Breath    and assess for illness characterized as Acute, Previously Undiagnosed, Uncertain Prognosis, Complicated, Systemic Symptoms, and Threat to Life/Bodily Function   The Differential Diagnoses include pneumonia, heart failure, complications from COVID including neurologic and cardiac disorders.  I obtained  Additional Historical Information from Cherry daughter , as the patient is a poor historian.  I decided to review pertinent External Data, and in summary outpatient clinic information from PCP over the last several days.  PCP evaluate shortness of breath on 11/13/2021 and considered supplemental oxygen therapy..   Clinical  Laboratory Tests Ordered, included CBC, Metabolic panel, and troponin . Review indicates normal except initial troponin high, hemoglobin low, MCV high, glucose high, creatinine high, calcium low, GFR low. Emergent testing abnormality management required for stabilization-elevated creatinine required IV fluids for AKI.  Radiologic Tests Ordered, included chest x-ray.  I independently Visualized: Radiograph images, which show no infiltrate or edema  Cardiac Monitor Tracing which shows normal sinus rhythm, indicating stable cardiac rhythm    This patient is Presenting for Evaluation of COVID infection, which does require a range of treatment options, and is a complaint that involves a high risk of morbidity and mortality.  Pharmaceutical Risk Management remdesivir and IV fluids Parenteral Treatment   Treatment Complication Risk Evaluation indicates appropriate disposition isHospitalization  After These Interventions, the Patient was reevaluated and was found with complicated COVID infection, tachypneic without increased work of breathing.  Oxygenation, generally satisfactory on room air however he feels more comfortable with nasal cannula oxygen.  He was elevated troponin, unchanged with time, indicating cardiac stress, likely due to COVID infection.  Last cardiac echo was in 2017 and was noted for normal systolic ejection fraction.  Patient has an undiagnosed movement disorder, notable during physical examination.    1:24 PM-Consult complete with hospitalist. Patient case explained and discussed. Requirement for observation agreed on. Possible Risk of decompensation including heart failure and respiratory distress.  He agrees to admit patient for observation for further evaluation and treatment. Call ended at 1:35 PM  CRITICAL CARE-yes Performed by: Daleen Bo              Final Clinical Impression(s) / ED Diagnoses Final diagnoses:  [id number] virus infection  Elevated  troponin    Rx / DC Orders ED Discharge Orders     None         Daleen Bo, MD 12/26/21 1335

## 2021-12-26 NOTE — Assessment & Plan Note (Signed)
-   Continue home Klonopin.  Database reviewed

## 2021-12-26 NOTE — Hospital Course (Addendum)
Christian [name] is a 76 yo male with PMH NSCLC s/p RML/RLL bilobectomies with LN dissection and s/p SBRT to L&R adrenal glands (July 2020 and Feb 20222 respectively).  He presented to the ER with worsening shortness of breath at home.  He has also been undergoing outpatient referral to pulmonology to see about possibly being started on home oxygen however has not had any documented hypoxia. He was last seen by oncology in October 2022. He was recently evaluated in the ER on 12/24/2021 with similar symptoms and his COVID testing from that time was positive. He was noted to be tachypneic on evaluation but no hypoxia.  He was however placed on oxygen for comfort in the ER. In addition he was noted to have a large amounts of uncontrolled irregular movements during evaluation.  His daughter noted that these have been present for approximately 1 to 2 years however have worsened once his COVID symptoms started.  They have not yet sought any further evaluation for this.

## 2021-12-26 NOTE — Assessment & Plan Note (Signed)
-  patient has history of CKD3a. Baseline creat ~ 1.2 - 1.3, eGFR 53 - 57 - mild increase to 1.45 on admission - for now start IVF and repeat BMP in am

## 2021-12-26 NOTE — ED Triage Notes (Signed)
Pt BIBA from home for shortness of breath sinc Thursday. Dx with covid on Monday at [facility]. Increased WOB esp with exertion. Accessory muscles, prolonged expiration. Hx lung cancer w 2/3 R lung removed 4 years ago. Lung sounds clear. Clammy and diaphoretic but afebrile. A&Ox4.   BP 106/61 HR 77 afib? SpO2 92% RA 98% 6L CBG 194 Temp 97.9

## 2021-12-26 NOTE — Assessment & Plan Note (Signed)
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  He has not had any prior work-up at this time.  Symptoms have been ongoing for approximately 1 to 2 years per daughter. - these may interfere with his ability to keep O2 on if truly indicated at some point but are certainly impressive on my evaluation in general - will consult neurology while patient hospitalized to see if some workup for etiology can be commenced then further probable outpatient workup

## 2021-12-26 NOTE — ED Notes (Signed)
Pt has daughter at bedside. Pt states " I feel really anxious, I take pills for that. They gave me something earllier but it was a long time ago." Pts daughter requests that more medications be given to reduce pt anxiety. Nurses states that provider will be informed. Will continue to monitor

## 2021-12-26 NOTE — Assessment & Plan Note (Signed)
-   Holding lisinopril in setting of renal function - Use labetalol or hydralazine as needed

## 2021-12-26 NOTE — Assessment & Plan Note (Signed)
-   nightly CPAP if used at home

## 2021-12-26 NOTE — Progress Notes (Signed)
Yellow MEWS was activated due to tachypnea 28RR/min. 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 He denies any distress. The hospitalist Gershon Cull, NP) and charge RN were notified

## 2021-12-26 NOTE — Assessment & Plan Note (Signed)
-   suspect some demand ischemia related; also renal function slightly worse - trend trop - EKG reviewed no ischemia changes and patient CP free

## 2021-12-26 NOTE — Assessment & Plan Note (Addendum)
-   at risk for progression given lung malignancy history and co-morbidities - started on Remdesivir, continue for now; will target 3-5 days pending clinical course -Trend inflammatory markers - No true hypoxia, therefore hold off on steroids at this time -Continue breathing treatments - Place incentive spirometer bedside

## 2021-12-26 NOTE — H&P (Addendum)
History and Physical    BELAL SCALLON  YJE:[id number]  DOB: [birth year]  DOA: 12/26/2021  PCP: [name], MD Patient coming from: home  Chief Complaint: SOB  HPI:  Mr. Cassarino is a 76 yo male with PMH NSCLC s/p RML/RLL bilobectomies with LN dissection and s/p SBRT to L&R adrenal glands (July 2020 and Feb 20222 respectively).  He presented to the ER with worsening shortness of breath at home.  He has also been undergoing outpatient referral to pulmonology to see about possibly being started on home oxygen however has not had any documented hypoxia. He was last seen by oncology in October 2022. He was recently evaluated in the ER on 12/24/2021 with similar symptoms and his COVID testing from that time was positive. He was noted to be tachypneic on evaluation but no hypoxia.  He was however placed on oxygen for comfort in the ER. In addition he was noted to have a large amounts of uncontrolled irregular movements during evaluation.  His daughter noted that these have been present for approximately 1 to 2 years however have worsened once his COVID symptoms started.  They have not yet sought any further evaluation for this.  I have personally briefly reviewed patient's old medical records in [facility] and discussed patient with the ER provider when appropriate/indicated.  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  He has  not had any prior work-up at this time.  Symptoms have been ongoing for approximately 1 to 2 years per daughter. - these may interfere with his ability to keep O2 on if truly indicated at some point but are certainly impressive on my evaluation in general - will consult neurology while patient hospitalized to see if some workup for etiology can be commenced then further probable outpatient workup   Chronic kidney disease, stage 3a ([place]) - patient has history of CKD3a. Baseline creat ~ 1.2 - 1.3, eGFR 53 - 57 - mild increase to 1.45 on admission - for now start IVF and repeat BMP in am  Elevated troponin - suspect some demand ischemia related; also renal function slightly worse - trend trop - EKG reviewed no ischemia changes and patient CP free  Macrocytic anemia - Baseline hemoglobin 11 to 12 g/dL - Currently at baseline - MCV 106 - Check B12 and folate  OSA on CPAP - nightly CPAP if used at home  CAD (coronary artery disease)- (present on admission) - Continue aspirin - Hold statin and lisinopril - Continue Toprol  Essential hypertension, benign- (present on admission) - Holding lisinopril in setting of renal function - Use labetalol or hydralazine as needed  Anxiety state- (present on admission) - Continue home Klonopin.  Database reviewed     Code Status:     Code Status: Full Code  DVT Prophylaxis:   heparin injection 5,000 Units Start: 12/26/21 1430   Anticipated disposition is to: Home  History: Past Medical History:  Diagnosis Date   Anxiety    Arthritis    Cancer ([place])  skin   Coronary artery disease    Diabetes mellitus type 2 with complications ([place])    not on medication was "taken off of list"; pt states that he was pre diabetic   Dyspnea    walks 100 yards then rest   GERD (gastroesophageal reflux disease)    ocassional no meds; pt states that its no longer an issue (01/14/18)   History of radiation therapy    Left lung- 10/22/21-10/30/21- Dr.  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   OSA on CPAP    Pneumonia 10/2017    Past Surgical History:  Procedure Laterality Date   CARDIAC CATHETERIZATION     2010 @ [facility] (per pt)   COLONOSCOPY W/ POLYPECTOMY     EYE SURGERY Bilateral    cataracts   OPEN REDUCTION INTERNAL FIXATION (ORIF) DISTAL RADIAL FRACTURE Right 11/26/2013   Procedure: OPEN REDUCTION INTERNAL FIXATION (ORIF) DISTAL RADIAL FRACTURE;  Surgeon: Newt Minion, MD;  Location: [place];  Service: Orthopedics;  Laterality: Right;  Open Reduction Internal Fixation Right Distal Radius    VIDEO ASSISTED THORACOSCOPY (VATS)/ LOBECTOMY Right 01/16/2018   Procedure: VIDEO ASSISTED THORACOSCOPY (VATS)/RIGHT MIDDLE AND LOWER LOBE LUNG LOBECTOMY WITH NODE BIOPSIES x6;  Surgeon: Melrose Nakayama, MD;  Location: [place];  Service: Thoracic;  Laterality: Right;   VIDEO BRONCHOSCOPY WITH ENDOBRONCHIAL NAVIGATION Right 12/15/2017   Procedure: VIDEO BRONCHOSCOPY WITH ENDOBRONCHIAL NAVIGATION;  Surgeon: Melrose Nakayama, MD;  Location: [place];  Service: Thoracic;  Laterality: Right;   VIDEO BRONCHOSCOPY WITH ENDOBRONCHIAL ULTRASOUND N/A 12/15/2017   Procedure: VIDEO BRONCHOSCOPY WITH ENDOBRONCHIAL ULTRASOUND;  Surgeon: Melrose Nakayama, MD;  Location: [place];  Service: Thoracic;  Laterality: N/A;     reports that he quit smoking about 3 years ago. His smoking use included cigarettes and e-cigarettes. He has a 51.00 pack-year smoking history. He has never used smokeless tobacco. He reports current alcohol use of about 7.0 standard drinks per week. He reports that he does not use drugs.  Allergies  Allergen Reactions    Celebrex [Celecoxib] Itching    Family History  Problem Relation Age of Onset   Heart disease Mother    Heart disease Father    Colon cancer Father        possible colon or prostate cancer, dx'd in his 72s, patient wasn't sure of source.    Prostate cancer Father        possible colon or prostate cancer, dx'd in his 35s, patient wasn't sure of source.    Heart disease Brother    Diabetes Brother    Kidney disease Brother    Heart disease Sister    Cancer Sister    Home Medications: Prior to Admission medications   Medication Sig Start Date End Date Taking? Authorizing Provider  albuterol (VENTOLIN HFA) 108 (90 Base) MCG/ACT inhaler Inhale 1-2 puffs into the lungs every 6 (six) hours as needed for wheezing or shortness of breath. 07/11/21  Yes [name], MD  aspirin 325 MG tablet Take 162.5 mg by mouth daily.   Yes [provider]  baclofen (  LIORESAL) 20 MG tablet TAKE 1 TABLET (20 MG TOTAL) BY MOUTH 2 (TWO) TIMES DAILY AS NEEDED FOR MUSCLE SPASMS. Patient taking differently: Take 40 mg by mouth at bedtime. 10/30/21  Yes [name], MD  clonazePAM (KLONOPIN) 0.5 MG tablet Take 1 tablet (0.5 mg total) by mouth at bedtime as needed. For insomnia 11/13/21  Yes [name], MD  diphenhydrAMINE (BENADRYL) 25 mg capsule Take 75 mg by mouth at bedtime.   Yes [provider]  lisinopril (ZESTRIL) 10 MG tablet Take 1 tablet (10 mg total) by mouth daily. 11/13/21  Yes [name], MD  Melatonin 10 MG TABS Take 10 mg by mouth at bedtime.    Yes [provider]  metFORMIN (GLUCOPHAGE) 500 MG tablet TAKE 1 TABLET BY MOUTH EVERY DAY WITH BREAKFAST Patient taking differently: Take 500 mg by mouth 2 (two) times daily with a meal. 12/17/21  Yes [name], MD  metoprolol succinate (TOPROL-XL) 50 MG 24 hr tablet Take 1 tablet (50 mg total) by mouth daily. Take with or immediately following a meal. Patient taking differently: Take 50 mg by mouth at  bedtime. Take with or immediately following a meal. 11/13/21  Yes [name], MD  nitroGLYCERIN (NITROSTAT) 0.4 MG SL tablet Place 1 tablet (0.4 mg total) under the tongue every 5 (five) minutes as needed for chest pain. 05/07/16  Yes Arbutus Leas, NP  simvastatin (ZOCOR) 40 MG tablet Take 1 tablet (40 mg total) by mouth at bedtime. 11/13/21  Yes [name], MD  vitamin E 100 UNIT capsule Take 100 Units by mouth daily.   Yes [provider]    Review of Systems:  Constitutional: positive for fevers, Respiratory: positive for cough and dyspnea on exertion, and Neurological: positive for coordination problems  Physical Exam:  Vitals:   12/26/21 1100 12/26/21 1109 12/26/21 1230 12/26/21 1300  BP: (!) 131/103 (!) 131/103 (!) 129/112 (!) 129/112  Pulse: (!) 112 (!) 112 80 83  Resp: 15 15 17 20   Temp:      TempSrc:      SpO2: 93% 93% 97% 98%  Weight:      Height:       Physical Exam Constitutional:      Comments: Pleasant elderly gentleman laying in bed in no distress but having uncontrolled rapid, irregular movements  HENT:     Head: Normocephalic and atraumatic.  Eyes:     Extraocular Movements: Extraocular movements intact.  Cardiovascular:     Rate and Rhythm: Normal rate and regular rhythm.  Pulmonary:     Comments: Decreased breath sounds right lower lung fields consistent with previous lobectomy; no significant rhonchi or wheezing elsewhere Abdominal:     General: Bowel sounds are normal. There is no distension.     Palpations: Abdomen is soft.     Tenderness: There is no abdominal tenderness.  Musculoskeletal:        General: Normal range of motion.     Cervical back: Normal range of motion and neck supple.  Skin:    General: Skin is warm and dry.  Neurological:     Comments: Rapid, irregular, uncontrolled movements involving head, bilateral upper extremities, and chest  Psychiatric:        Mood and Affect: Mood normal.     Labs on Admission:  I  have personally reviewed following labs and imaging studies Results for orders placed or performed during the hospital encounter of 12/26/21 (from the past 24  hour(s))  Comprehensive metabolic panel     Status: Abnormal   Collection Time: 12/26/21 10:46 AM  Result Value Ref Range   Sodium 136 135 - 145 mmol/L   Potassium 4.6 3.5 - 5.1 mmol/L   Chloride 106 98 - 111 mmol/L   CO2 23 22 - 32 mmol/L   Glucose, Bld 148 (H) 70 - 99 mg/dL   BUN 20 8 - 23 mg/dL   Creatinine, Ser 1.45 (H) 0.61 - 1.24 mg/dL   Calcium 8.6 (L) 8.9 - 10.3 mg/dL   Total Protein 7.1 6.5 - 8.1 g/dL   Albumin 3.8 3.5 - 5.0 g/dL   AST 32 15 - 41 U/L   ALT 25 0 - 44 U/L   Alkaline Phosphatase 67 38 - 126 U/L   Total Bilirubin 0.7 0.3 - 1.2 mg/dL   GFR, Estimated 51 (L) >60 mL/min   Anion gap 7 5 - 15  CBC with Differential     Status: Abnormal   Collection Time: 12/26/21 10:46 AM  Result Value Ref Range   WBC 7.9 4.0 - 10.5 K/uL   RBC 3.27 (L) 4.22 - 5.81 MIL/uL   Hemoglobin 11.4 (L) 13.0 - 17.0 g/dL   HCT 34.7 (L) 39.0 - 52.0 %   MCV 106.1 (H) 80.0 - 100.0 fL   MCH 34.9 (H) 26.0 - 34.0 pg   MCHC 32.9 30.0 - 36.0 g/dL   RDW 14.6 11.5 - 15.5 %   Platelets 127 (L) 150 - 400 K/uL   nRBC 0.0 0.0 - 0.2 %   Neutrophils Relative % 72 %   Neutro Abs 5.6 1.7 - 7.7 K/uL   Lymphocytes Relative 11 %   Lymphs Abs 0.9 0.7 - 4.0 K/uL   Monocytes Relative 10 %   Monocytes Absolute 0.8 0.1 - 1.0 K/uL   Eosinophils Relative 7 %   Eosinophils Absolute 0.5 0.0 - 0.5 K/uL   Basophils Relative 0 %   Basophils Absolute 0.0 0.0 - 0.1 K/uL   Immature Granulocytes 0 %   Abs Immature Granulocytes 0.03 0.00 - 0.07 K/uL  Blood gas, venous     Status: Abnormal   Collection Time: 12/26/21 10:46 AM  Result Value Ref Range   FIO2 21.00    pH, Ven 7.279 7.250 - 7.430   pCO2, Ven 49.4 44.0 - 60.0 mmHg   pO2, Ven 71.0 (H) 32.0 - 45.0 mmHg   Bicarbonate 22.4 20.0 - 28.0 mmol/L   Acid-base deficit 4.0 (H) 0.0 - 2.0 mmol/L   O2  Saturation 91.2 %   Patient temperature 98.6   Troponin I (High Sensitivity)     Status: Abnormal   Collection Time: 12/26/21 10:46 AM  Result Value Ref Range   Troponin I (High Sensitivity) 79 (H) <18 ng/L  Brain natriuretic peptide     Status: None   Collection Time: 12/26/21 10:46 AM  Result Value Ref Range   B Natriuretic Peptide 69.9 0.0 - 100.0 pg/mL  Troponin I (High Sensitivity)     Status: Abnormal   Collection Time: 12/26/21 12:26 PM  Result Value Ref Range   Troponin I (High Sensitivity) 77 (H) <18 ng/L     Radiological Exams on Admission: DG Chest 2 View  Result Date: 12/26/2021 CLINICAL DATA:  Shortness of breath EXAM: CHEST - 2 VIEW COMPARISON:  12/24/2021 FINDINGS: Volume loss right hemithorax. No new consolidation or edema. Chronic blunting right costophrenic angle. Stable cardiomediastinal contours. IMPRESSION: No acute process in the chest. Electronically Signed  By: Macy Mis M.D.   On: 12/26/2021 10:25   DG Chest 2 View  Result Date: 12/24/2021 CLINICAL DATA:  Cough and congestion, shortness of breath for 4 days, fever EXAM: CHEST - 2 VIEW COMPARISON:  07/11/2021, 09/24/2021 FINDINGS: Frontal and lateral views of the chest demonstrate a stable cardiac silhouette. Diffuse increased interstitial prominence consistent with scarring. Volume loss right lung base consistent with prior right lower lobe resection. No acute airspace disease. Chronic right pleural thickening versus trace right effusion. No pneumothorax. The lingular nodule seen on recent CT is not readily apparent on this exam. No acute bony abnormalities. IMPRESSION: 1. Trace right pleural effusion versus chronic pleural thickening. 2. Chronic interstitial scarring without acute airspace disease. 3. The suspicious lingular nodule seen on previous CT not well visualized by x-ray. Electronically Signed   By: Randa Ngo M.D.   On: 12/24/2021 17:51   DG Chest 2 View  Final Result      Consults called:   Neurology    EKG: Independently reviewed. NSR   Dwyane Dee, MD [facility] 12/26/2021, 2:58 PM

## 2021-12-26 NOTE — Assessment & Plan Note (Signed)
-   Baseline hemoglobin 11 to 12 g/dL - Currently at baseline - MCV 106 - Check B12 and folate

## 2021-12-26 NOTE — Assessment & Plan Note (Signed)
-   Continue aspirin - Hold statin and lisinopril - Continue Toprol

## 2021-12-26 NOTE — Progress Notes (Signed)
Pt refused cpap tonight.  RN aware.

## 2021-12-27 ENCOUNTER — Telehealth: Payer: PPO | Admitting: Family Medicine

## 2021-12-27 ENCOUNTER — Inpatient Hospital Stay (HOSPITAL_COMMUNITY): Payer: PPO

## 2021-12-27 ENCOUNTER — Other Ambulatory Visit: Payer: Self-pay

## 2021-12-27 DIAGNOSIS — D539 Nutritional anemia, unspecified: Secondary | ICD-10-CM

## 2021-12-27 DIAGNOSIS — U071 COVID-19: Secondary | ICD-10-CM | POA: Diagnosis present

## 2021-12-27 DIAGNOSIS — E669 Obesity, unspecified: Secondary | ICD-10-CM

## 2021-12-27 DIAGNOSIS — R778 Other specified abnormalities of plasma proteins: Secondary | ICD-10-CM

## 2021-12-27 DIAGNOSIS — N1831 Chronic kidney disease, stage 3a: Secondary | ICD-10-CM | POA: Diagnosis not present

## 2021-12-27 DIAGNOSIS — J1282 Pneumonia due to coronavirus disease 2019: Secondary | ICD-10-CM

## 2021-12-27 DIAGNOSIS — F411 Generalized anxiety disorder: Secondary | ICD-10-CM | POA: Diagnosis not present

## 2021-12-27 DIAGNOSIS — I2510 Atherosclerotic heart disease of native coronary artery without angina pectoris: Secondary | ICD-10-CM

## 2021-12-27 DIAGNOSIS — G4733 Obstructive sleep apnea (adult) (pediatric): Secondary | ICD-10-CM

## 2021-12-27 DIAGNOSIS — I10 Essential (primary) hypertension: Secondary | ICD-10-CM

## 2021-12-27 DIAGNOSIS — G255 Other chorea: Secondary | ICD-10-CM

## 2021-12-27 DIAGNOSIS — Z9989 Dependence on other enabling machines and devices: Secondary | ICD-10-CM

## 2021-12-27 LAB — ANTISTREPTOLYSIN O TITER: ASO: 48 IU/mL (ref 0.0–200.0)

## 2021-12-27 LAB — CBC WITH DIFFERENTIAL/PLATELET
Abs Immature Granulocytes: 0.02 10*3/uL (ref 0.00–0.07)
Basophils Absolute: 0 10*3/uL (ref 0.0–0.1)
Basophils Relative: 0 %
Eosinophils Absolute: 0.2 10*3/uL (ref 0.0–0.5)
Eosinophils Relative: 2 %
HCT: 33.7 % — ABNORMAL LOW (ref 39.0–52.0)
Hemoglobin: 10.8 g/dL — ABNORMAL LOW (ref 13.0–17.0)
Immature Granulocytes: 0 %
Lymphocytes Relative: 10 %
Lymphs Abs: 0.8 10*3/uL (ref 0.7–4.0)
MCH: 34.8 pg — ABNORMAL HIGH (ref 26.0–34.0)
MCHC: 32 g/dL (ref 30.0–36.0)
MCV: 108.7 fL — ABNORMAL HIGH (ref 80.0–100.0)
Monocytes Absolute: 0.9 10*3/uL (ref 0.1–1.0)
Monocytes Relative: 11 %
Neutro Abs: 6.2 10*3/uL (ref 1.7–7.7)
Neutrophils Relative %: 77 %
Platelets: 121 10*3/uL — ABNORMAL LOW (ref 150–400)
RBC: 3.1 MIL/uL — ABNORMAL LOW (ref 4.22–5.81)
RDW: 14.8 % (ref 11.5–15.5)
WBC: 8.1 10*3/uL (ref 4.0–10.5)
nRBC: 0 % (ref 0.0–0.2)

## 2021-12-27 LAB — COMPREHENSIVE METABOLIC PANEL
ALT: 25 U/L (ref 0–44)
AST: 48 U/L — ABNORMAL HIGH (ref 15–41)
Albumin: 3.8 g/dL (ref 3.5–5.0)
Alkaline Phosphatase: 61 U/L (ref 38–126)
Anion gap: 10 (ref 5–15)
BUN: 19 mg/dL (ref 8–23)
CO2: 20 mmol/L — ABNORMAL LOW (ref 22–32)
Calcium: 8.4 mg/dL — ABNORMAL LOW (ref 8.9–10.3)
Chloride: 105 mmol/L (ref 98–111)
Creatinine, Ser: 1.22 mg/dL (ref 0.61–1.24)
GFR, Estimated: >60 mL/min (ref >60)
Glucose, Bld: 125 mg/dL — ABNORMAL HIGH (ref 70–99)
Potassium: 4.9 mmol/L (ref 3.5–5.1)
Sodium: 135 mmol/L (ref 135–145)
Total Bilirubin: 0.9 mg/dL (ref 0.3–1.2)
Total Protein: 6.9 g/dL (ref 6.5–8.1)

## 2021-12-27 LAB — LACTATE DEHYDROGENASE: LDH: 289 U/L — ABNORMAL HIGH (ref 98–192)

## 2021-12-27 LAB — TROPONIN I (HIGH SENSITIVITY)
Troponin I (High Sensitivity): 81 ng/L — ABNORMAL HIGH (ref <18)
Troponin I (High Sensitivity): 80 ng/L — ABNORMAL HIGH (ref <18)

## 2021-12-27 LAB — GLIADIN ANTIBODIES, SERUM
Antigliadin Abs, IgA: 4 units (ref 0–19)
Gliadin IgG: 2 units (ref 0–19)

## 2021-12-27 LAB — SEDIMENTATION RATE: Sed Rate: 40 mm/hr — ABNORMAL HIGH (ref 0–16)

## 2021-12-27 LAB — D-DIMER, QUANTITATIVE: D-Dimer, Quant: 1.11 ug/mL-FEU — ABNORMAL HIGH (ref 0.00–0.50)

## 2021-12-27 LAB — C-REACTIVE PROTEIN: CRP: 7.5 mg/dL — ABNORMAL HIGH (ref <1.0)

## 2021-12-27 LAB — RPR: RPR Ser Ql: NONREACTIVE

## 2021-12-27 LAB — MAGNESIUM: Magnesium: 2.1 mg/dL (ref 1.7–2.4)

## 2021-12-27 LAB — FERRITIN: Ferritin: 820 ng/mL — ABNORMAL HIGH (ref 24–336)

## 2021-12-27 LAB — CERULOPLASMIN: Ceruloplasmin: 23.6 mg/dL (ref 16.0–31.0)

## 2021-12-27 MED ORDER — BACLOFEN 20 MG PO TABS
20.0000 mg | ORAL_TABLET | Freq: Two times a day (BID) | ORAL | Status: DC
  Administered 2021-12-27 – 2022-01-04 (×17): 20 mg via ORAL
  Filled 2021-12-27 (×17): qty 1

## 2021-12-27 MED ORDER — HYDROCOD POLI-CHLORPHE POLI ER 10-8 MG/5ML PO SUER
5.0000 mL | Freq: Two times a day (BID) | ORAL | Status: DC | PRN
  Administered 2021-12-29 – 2021-12-31 (×5): 5 mL via ORAL
  Filled 2021-12-27 (×5): qty 5

## 2021-12-27 MED ORDER — IPRATROPIUM-ALBUTEROL 0.5-2.5 (3) MG/3ML IN SOLN
3.0000 mL | Freq: Four times a day (QID) | RESPIRATORY_TRACT | Status: DC
  Administered 2021-12-27 – 2021-12-28 (×5): 3 mL via RESPIRATORY_TRACT
  Filled 2021-12-27 (×5): qty 3

## 2021-12-27 MED ORDER — METHYLPREDNISOLONE SODIUM SUCC 125 MG IJ SOLR
80.0000 mg | Freq: Every day | INTRAMUSCULAR | Status: DC
  Administered 2021-12-27 – 2021-12-31 (×5): 80 mg via INTRAVENOUS
  Filled 2021-12-27 (×5): qty 2

## 2021-12-27 MED ORDER — FOLIC ACID 1 MG PO TABS
1.0000 mg | ORAL_TABLET | Freq: Every day | ORAL | Status: DC
  Administered 2021-12-27 – 2022-01-04 (×9): 1 mg via ORAL
  Filled 2021-12-27 (×9): qty 1

## 2021-12-27 MED ORDER — THIAMINE HCL 100 MG PO TABS
100.0000 mg | ORAL_TABLET | Freq: Every day | ORAL | Status: DC
  Administered 2021-12-27 – 2022-01-04 (×9): 100 mg via ORAL
  Filled 2021-12-27 (×9): qty 1

## 2021-12-27 MED ORDER — LORAZEPAM 2 MG/ML IJ SOLN
0.5000 mg | Freq: Once | INTRAMUSCULAR | Status: AC
  Administered 2021-12-27: 0.5 mg via INTRAVENOUS
  Filled 2021-12-27: qty 1

## 2021-12-27 MED ORDER — ADULT MULTIVITAMIN W/MINERALS CH
1.0000 | ORAL_TABLET | Freq: Every day | ORAL | Status: DC
  Administered 2021-12-27 – 2022-01-04 (×9): 1 via ORAL
  Filled 2021-12-27 (×9): qty 1

## 2021-12-27 NOTE — Progress Notes (Signed)
The pt had three episodes of panic attacks during this shift. He gets worked up, tachypneic, and his oxygen sats drop to the 80's. The duration of every panic attack is around 2 mins. Reassurance and guidance to concentrate on breathing were effective in solving the issues. Pt reports he experiences these panic attacks several times daily. The hospitalist on coverage Gershon Cull was notified.

## 2021-12-27 NOTE — Progress Notes (Signed)
°  Transition of Care [facility]) Screening Note   Patient Details  Name: DOSSIE SWOR Date of Birth: 17-Jan-1946   [facility]) CM/SW Contact:    Lennart Pall, LCSW Phone Number: 12/27/2021, 3:08 PM    Transition of Care Department [facility]) has reviewed patient and no TOC needs have been identified at this time. We will continue to monitor patient advancement through interdisciplinary progression rounds. If new patient transition needs arise, please place a TOC consult.

## 2021-12-27 NOTE — Progress Notes (Addendum)
Chaplain is responding to consult for an Scientist, physiological, Healthcare POA.  Because Christian [name] has precautions, Chaplain will not be able to complete AD notarization with witnesses.  Chaplain will reach out to family and [place] to let them know.    Chaplain will provide support.    12/27/21 1100  Clinical Encounter Type  Visited With Patient not available  Visit Type Initial;Social support

## 2021-12-27 NOTE — Consult Note (Signed)
NEUROLOGY CONSULTATION NOTE   Date of service: December 27, 2021 Patient Name: Christian [name] MRN:  [id number] DOB:  1946-04-08 Reason for consult: "choreiform movements" Requesting Provider: Allie Bossier, MD _ _ _   _ __   _ __ _ _  __ __   _ __   __ _  History of Present Illness  Christian [name] is a 76 y.o. male with PMH significant for CAD, DM2, NSCLC s/p bilateral lobectomies + LN dissection, radiation, hx of shingles who presents with worsening shortness of breath along with recent COVID 19 infection.  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 They worsen everytime he is sick and has had worsening of these movements. He has some voluntary control and is able to stop them for a minute. He is not particularly bothered by these movements at home. No family history of movement disorder. No history of strokes. Was born in [place] and has been here whole life.   ROS   Constitutional Denies weight loss, fever and chills.   HEENT Denies changes in vision and hearing.   Respiratory Denies SOB and cough.   CV Denies palpitations and CP   GI Denies abdominal pain, nausea, vomiting and diarrhea.   GU Denies dysuria and urinary frequency.   MSK Denies myalgia and joint pain.   Skin Denies rash and pruritus.   Neurological Denies headache and syncope.   Psychiatric Denies recent changes in mood. Denies anxiety and depression.    Past History   Past Medical History:  Diagnosis Date   Anxiety    Arthritis    Cancer ([place])    skin   Coronary artery disease    Diabetes mellitus type 2 with complications ([place])    not on medication was "taken off of list"; pt states that he was pre diabetic   Dyspnea    walks 100 yards then rest   GERD (gastroesophageal reflux disease)    ocassional no meds; pt states that its no longer an issue (01/14/18)   History of radiation therapy    Left lung- 10/22/21-10/30/21- Dr. 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   OSA on CPAP    Pneumonia 10/2017   Past Surgical History:  Procedure Laterality Date   CARDIAC CATHETERIZATION     2010 @ [facility] (per pt)   COLONOSCOPY W/ POLYPECTOMY     EYE SURGERY Bilateral    cataracts   OPEN REDUCTION INTERNAL FIXATION (ORIF) DISTAL RADIAL FRACTURE Right 11/26/2013   Procedure: OPEN REDUCTION INTERNAL FIXATION (ORIF) DISTAL RADIAL FRACTURE;  Surgeon: Newt Minion, MD;  Location: [facility];  Service: Orthopedics;  Laterality: Right;  Open Reduction Internal Fixation Right Distal Radius    VIDEO ASSISTED THORACOSCOPY (VATS)/ LOBECTOMY Right 01/16/2018   Procedure: VIDEO ASSISTED THORACOSCOPY (VATS)/RIGHT MIDDLE AND LOWER LOBE LUNG LOBECTOMY WITH NODE BIOPSIES x6;  Surgeon: Melrose Nakayama, MD;  Location: [facility];  Service: Thoracic;  Laterality: Right;   VIDEO BRONCHOSCOPY WITH ENDOBRONCHIAL NAVIGATION Right 12/15/2017   Procedure: VIDEO BRONCHOSCOPY WITH ENDOBRONCHIAL NAVIGATION;  Surgeon: Melrose Nakayama, MD;  Location: [facility];  Service: Thoracic;  Laterality: Right;   VIDEO BRONCHOSCOPY WITH ENDOBRONCHIAL ULTRASOUND N/A 12/15/2017   Procedure: VIDEO BRONCHOSCOPY WITH ENDOBRONCHIAL ULTRASOUND;  Surgeon: Melrose Nakayama, MD;  Location: [facility] OR;  Service: Thoracic;  Laterality: N/A;   Family History  Problem Relation Age of Onset   Heart disease Mother    Heart disease Father    Colon cancer Father        possible colon or prostate cancer, dx'd in his 37s, patient wasn't sure of source.    Prostate cancer Father        possible colon or prostate cancer, dx'd in his 18s, patient wasn't sure of source.    Heart disease Brother     Diabetes Brother    Kidney disease Brother    Heart disease Sister    Cancer Sister    Social History   Socioeconomic History   Marital status: Widowed    Spouse name: Not on file   Number of children: Not on file   Years of education: Not on file   Highest education level: Not on file  Occupational History   Not on file  Tobacco Use   Smoking status: Former    Packs/day: 1.00    Years: 51.00    Pack years: 51.00    Types: Cigarettes, E-cigarettes    Quit date: 01/15/2018    Years since quitting: 3.9   Smokeless tobacco: Never   Tobacco comments:    Pt currently vapes daily  Vaping Use   Vaping Use: Every day  Substance and Sexual Activity   Alcohol use: Yes    Alcohol/week: 7.0 standard drinks    Types: 7 Glasses of wine per week   Drug use: No   Sexual activity: Not on file  Other Topics Concern   Not on file  Social History Narrative   [employer] fan   Widowed 2017 (from second marriage)   Retired from [employer]   2 kids, 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 1 each 3 12/26/2021   aspirin 325 MG tablet Take 162.5 mg by mouth daily.   12/25/2021   baclofen (LIORESAL) 20 MG tablet TAKE 1 TABLET (20 MG TOTAL) BY MOUTH 2 (TWO) TIMES DAILY AS NEEDED FOR MUSCLE SPASMS. (Patient taking differently: Take 40 mg by mouth at bedtime.) 180 tablet 0 12/25/2021   clonazePAM (KLONOPIN) 0.5 MG tablet Take 1 tablet (0.5  mg total) by mouth at bedtime as needed. For insomnia 30 tablet 1 Past Week   diphenhydrAMINE (BENADRYL) 25 mg capsule Take 75 mg by mouth at bedtime.   12/25/2021   lisinopril (ZESTRIL) 10 MG tablet Take 1 tablet (10 mg total) by mouth daily. 90 tablet 3 12/25/2021   Melatonin 10 MG TABS Take 10 mg by mouth at bedtime.    12/25/2021  metFORMIN (GLUCOPHAGE) 500 MG tablet TAKE 1 TABLET BY MOUTH EVERY DAY WITH BREAKFAST (Patient taking differently: Take 500 mg by mouth 2 (two) times daily with a meal.) 90 tablet 3 12/25/2021   metoprolol succinate (TOPROL-XL) 50 MG 24 hr tablet Take 1 tablet (50 mg total) by mouth daily. Take with or immediately following a meal. (Patient taking differently: Take 50 mg by mouth at bedtime. Take with or immediately following a meal.) 90 tablet 3 12/25/2021 at 2100   nitroGLYCERIN (NITROSTAT) 0.4 MG SL tablet Place 1 tablet (0.4 mg total) under the tongue every 5 (five) minutes as needed for chest pain. 25 tablet 1 UNK   simvastatin (ZOCOR) 40 MG tablet Take 1 tablet (40 mg total) by mouth at bedtime. 90 tablet 3 12/25/2021   vitamin E 100 UNIT capsule Take 100 Units by mouth daily.   12/25/2021     Vitals   Vitals:   12/27/21 0200 12/27/21 0541 12/27/21 0941 12/27/21 1134  BP: (!) 113/91 117/84 125/78   Pulse: 80 85 82   Resp: 18 (!) 22 (!) 21   Temp: 98.3 F (36.8 C) 98.2 F (36.8 C) 98.3 F (36.8 C)   TempSrc: Axillary Oral Oral   SpO2: 100% 92% 91% 95%  Weight:      Height:         Body mass index is 36.62 kg/m.  Physical Exam   General: Laying comfortably in bed; in no acute distress.  HENT: Normal oropharynx and mucosa. Normal external appearance of ears and nose.  Neck: Supple, no pain or tenderness  CV: No JVD. No peripheral edema.  Pulmonary: Symmetric Chest rise. Normal respiratory effort.  Abdomen: Soft to touch, non-tender.  Ext: No cyanosis, edema, or deformity  Skin: No rash. Normal palpation of skin.   Musculoskeletal: Normal digits and  nails by inspection. No clubbing.  Neurologic Examination  Mental status/Cognition: somnolent but awakens to voice, oriented to self, place, month and year, good attention. Speech/language: Fluent, comprehension intact, object naming intact, repetition intact. Cranial nerves:   CN II Pupils equal and reactive to light, no VF deficits    CN III,IV,VI EOM intact, no gaze preference or deviation, no nystagmus    CN V normal sensation in V1, V2, and V3 segments bilaterally    CN VII no asymmetry, no nasolabial fold flattening    CN VIII normal hearing to speech    CN IX & X normal palatal elevation, no uvular deviation    CN XI 5/5 head turn and 5/5 shoulder shrug bilaterally    CN XII midline tongue protrusion    Motor:  Muscle bulk: normal, tone normal, pronator drift none,. choreiform movements on exam with rapid and unpredictable movements of limbs, head/trunk. These appear involuntary, nonpatterned with variable speed, timing, and direction. Mvmt Root Nerve  Muscle Right Left Comments  SA C5/6 Ax Deltoid     EF C5/6 Mc Biceps 5 5   EE C6/7/8 Rad Triceps 5 5   WF C6/7 Med FCR     WE C7/8 PIN ECU     F Ab C8/T1 U ADM/FDI 5 5   HF L1/2/3 Fem Illopsoas 5 5   KE L2/3/4 Fem Quad 5 5   DF L4/5 D Peron Tib Ant 5 5   PF S1/2 Tibial Grc/Sol 5 5    Reflexes:  Right Left Comments  Pectoralis      Biceps (C5/6) 2 2   Brachioradialis (C5/6) 2 2    Triceps (C6/7)  2 2    Patellar (L3/4) 2 2    Achilles (S1)      Hoffman      Plantar     Jaw jerk    Sensation:  Light touch Intact throughout   Pin prick    Temperature    Vibration   Proprioception    Coordination/Complex Motor:  - Finger to Nose intact BL - Heel to shin intact BL - Rapid alternating movement are normal - Gait: Deferred for patient safety.  Labs   CBC:  Recent Labs  Lab 12/26/21 1046 12/27/21 0035  WBC 7.9 8.1  NEUTROABS 5.6 6.2  HGB 11.4* 10.8*  HCT 34.7* 33.7*  MCV 106.1* 108.7*  PLT 127* 121*     Basic Metabolic Panel:  Lab Results  Component Value Date   NA 135 12/27/2021   K 4.9 12/27/2021   CO2 20 (L) 12/27/2021   GLUCOSE 125 (H) 12/27/2021   BUN 19 12/27/2021   CREATININE 1.22 12/27/2021   CALCIUM 8.4 (L) 12/27/2021   GFRNONAA >60 12/27/2021   GFRAA >60 06/13/2020   Lipid Panel:  Lab Results  Component Value Date   LDLCALC 79 11/13/2021   HgbA1c:  Lab Results  Component Value Date   HGBA1C 9.7 (H) 11/13/2021   Urine Drug Screen: No results found for: LABOPIA, COCAINSCRNUR, LABBENZ, AMPHETMU, THCU, LABBARB  Alcohol Level No results found for: ETH  Impression   ARMOND CUTHRELL is a 76 y.o. male with PMH significant for CAD, DM2, NSCLC s/p bilateral lobectomies + LN dissection, radiation, hx of shingles who presents with worsening shortness of breath along with recent COVID 19 infection. Of note, he has baseline movements that have been ongoing for a couple years. They worsen everytime he is sick and has had worsening of these movements.  Exam is notable for choreiform movements with rapid and unpredictable movements of limbs, head/trunk. These appear involuntary, nonpatterned with variable speed, timing, and direction.   Differential broad including paraneoplastic, autoimmune, heavy metal toxicity, wilson's disease [place] disease, celiac disease, basal ganglia stroke, nutritional.  Recommendations  - Chorea labs with normal Copper and ceuloplamin, negative ASO titer, autoimmune and heavy metal panel pending. Paraneoplastic panel pending. Ceilac workup pending, RPR negative, B12 borderline normal. - Would not be able to lie still for an MRI Brain, will get a CTH without contrast for evaluation for a potential old stroke. 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 - This has been a long standing issue and I think patient will benefit from outpatient evaluation by movement disorder specialist Dr. Wells Guiles Tat with [facility]. - PO Vit b12 1000mg   daily. ______________________________________________________________________   Thank you for the opportunity to take part in the care of this patient. If you have any further questions, please contact the neurology consultation attending.  Signed,  [place] Pager Number [phone] _ _ _   _ __   _ __ _ _  __ __   _ __   __ _

## 2021-12-27 NOTE — Progress Notes (Signed)
Pt refused CPAP qhs.  States he is no longer able to tolerate.

## 2021-12-27 NOTE — Progress Notes (Signed)
PROGRESS NOTE    Christian [name]  QAS:[id number] DOB: 06/08/1946 DOA: 12/26/2021 PCP: [name], MD   Brief Narrative:  Mr. Christian [name] is a 76 yo WM PMHx NSCLC s/p RML/RLL bilobectomies with LN dissection and s/p SBRT to L&R adrenal glands (July 2020 and Feb 20222 respectively).   Presented to the ER with worsening shortness of breath at home.  He has also been undergoing outpatient referral to pulmonology to see about possibly being started on home oxygen however has not had any documented hypoxia. He was last seen by oncology in October 2022. He was recently evaluated in the ER on 12/24/2021 with similar symptoms and his COVID testing from that time was positive. He was noted to be tachypneic on evaluation but no hypoxia.  He was however placed on oxygen for comfort in the ER. In addition he was noted to have a large amounts of uncontrolled irregular movements during evaluation.  His daughter noted that these have been present for approximately 1 to 2 years however have worsened once his COVID symptoms started.  They have not yet sought any further evaluation for this.   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  Patient verified not on O2 at home.  Patient and son stated had appointment with pulmonologist at end of the month, in order to obtain oxygen.  Son unsure if patient just felt short of breath or PCP actually measured patient being SOB.   Assessment & Plan:  Covid vaccination; vaccinated 3/4  Principal Problem:   COVID-19 virus infection Active Problems:   Anxiety state   Essential hypertension, benign   CAD (coronary artery disease)   OSA on CPAP   Chorea   Chronic kidney disease, stage 3a (HCC)   Elevated troponin   Macrocytic anemia   Obesity, Class II, BMI 35-39.9   Pneumonia  due to COVID-19 virus  COVID-19 virus infection/COVID pneumonia- (present on admission) - at risk for progression given lung malignancy history and co-morbidities -1/18 remdesivir per pharmacy protocol - 1/19 overnight patient became hypoxic now on O2.  We will start steroids. - 1/19 Solu-Medrol 80 mg daily - DuoNeb QID - Incentive spirometry - Flutter valve -Vitamins per COVID protocol (folic acid, thiamine, multivitamin.) - Continue to trend inflammatory markers COVID-19 Labs  Recent Labs    12/26/21 1505 12/27/21 0035 12/27/21 0559  DDIMER 1.17* 1.11*  --   FERRITIN 922*  --  820*  LDH 163 289*  --   CRP 5.0* 7.5*  --     Lab Results  Component Value Date   SARSCOV2NAA POSITIVE (A) 12/24/2021   [place] NEGATIVE 07/11/2021   [place] NEGATIVE 06/07/2019      Chorea - Involuntary, rapid, and variable movements appreciated during exam in the ER on admission; these appear concerning for Chorea-like movements and are worsening per daughter after development of COVID symptoms.  He has not had any prior work-up at this time.  Symptoms have been ongoing for approximately 1 to 2 years per daughter. -Per patient and son worsen when patient has acute illness.    Chronic kidney disease, stage 3a (HCC) (baseline  CR ~ 1.2 - 1.3) Lab Results  Component Value Date   CREATININE 1.22 12/27/2021   CREATININE 1.45 (H) 12/26/2021   CREATININE 1.18 12/24/2021   CREATININE 1.24 11/13/2021   CREATININE 1.42 (H)  09/24/2021  -Back to baseline   Elevated troponin - suspect some demand ischemia related; also renal function slightly worse - trend trop  Latest Reference Range & Units 12/26/21 10:46 12/26/21 12:26 12/26/21 21:07 12/27/21 00:35 12/27/21 05:59  Troponin I (High Sensitivity) <18 ng/L 79 (H) 77 (H) 71 (H) 81 (H) 80 (H)  (H): Data is abnormally high - EKG reviewed no ischemia changes and patient CP free   Macrocytic anemia - Baseline hemoglobin 11 to 12 g/dL -  Currently at baseline - MCV 106 - 1/19 Anemia Panel pending   OSA on CPAP - nightly CPAP if used at home  Essential HTN- (present on admission) - Holding lisinopril in setting of renal function - Use labetalol or hydralazine as needed -Toprol 50 mg daily   CAD (coronary artery disease)- (present on admission) -See essential HTN   Anxiety state- (present on admission) - Continue home Klonopin.  Database reviewed  Obesity class II (BMI 36.62 kg/m) - Once patient's current acute issues resolved counseled to address with PCP     DVT prophylaxis: Subcu heparin Code Status: Full Family Communication: 1/19 son at bedside for discussion of plan of care all questions answered Status is: Inpatient    Dispo: The patient is from: Home              Anticipated d/c is to: Home              Anticipated d/c date is: > 3 days              Patient currently is not medically stable to d/c.      Consultants:  Neurology    Procedures/Significant Events:     I have personally reviewed and interpreted all radiology studies and my findings are as above.  VENTILATOR SETTINGS: Nasal cannula 1/19 Flow 2 L/min SPO2 92%   Cultures    Antimicrobials: Anti-infectives (From admission, onward)    Start     Ordered Stop   12/27/21 1000  remdesivir 100 mg in sodium chloride 0.9 % 100 mL IVPB       See Hyperspace for full Linked Orders Report.   12/26/21 1254 12/31/21 0959   12/26/21 1400  remdesivir 200 mg in sodium chloride 0.9% 250 mL IVPB       See Hyperspace for full Linked Orders Report.   12/26/21 1254 12/26/21 1600         Devices    LINES / TUBES:      Continuous Infusions:  remdesivir 100 mg in NS 100 mL 100 mg (12/27/21 1051)     Objective: Vitals:   12/27/21 0941 12/27/21 1134 12/27/21 1400 12/27/21 1520  BP: 125/78  119/85   Pulse: 82  85   Resp: (!) 21  (!) 22   Temp: 98.3 F (36.8 C)  98.3 F (36.8 C)   TempSrc: Oral  Oral   SpO2: 91% 95%  96% 96%  Weight:      Height:       No intake or output data in the 24 hours ending 12/27/21 1810 Filed Weights   12/26/21 0934  Weight: 122.5 kg    Examination:  General: A/O x4, positive acute respiratory distress Eyes: negative scleral hemorrhage, negative anisocoria, negative icterus ENT: Negative Runny nose, negative gingival bleeding, Neck:  Negative scars, masses, torticollis, lymphadenopathy, JVD Lungs: Clear to auscultation bilaterally, positive diffuse expiratory wheezes  Cardiovascular: Regular rate and rhythm without murmur gallop or rub normal S1 and S2 Abdomen: MORBIDLY  OBESE, negative abdominal pain, nondistended, positive soft, bowel sounds, no rebound, no ascites, no appreciable mass Extremities: No significant cyanosis, clubbing, or edema bilateral lower extremities Skin: Negative rashes, lesions, ulcers Psychiatric:  Negative depression, negative anxiety, negative fatigue, negative mania  Central nervous system:  Cranial nerves II through XII intact, tongue/uvula midline, all extremities muscle strength 5/5, sensation intact throughout, negative dysarthria, negative expressive aphasia, negative receptive aphasia.  Positive Chorea   .     Data Reviewed: Care during the described time interval was provided by me .  I have reviewed this patient's available data, including medical history, events of note, physical examination, and all test results as part of my evaluation.   CBC: Recent Labs  Lab 12/24/21 1447 12/26/21 1046 12/27/21 0035  WBC 5.1 7.9 8.1  NEUTROABS 3.0 5.6 6.2  HGB 11.4* 11.4* 10.8*  HCT 35.8* 34.7* 33.7*  MCV 107.2* 106.1* 108.7*  PLT 120* 127* 093*   Basic Metabolic Panel: Recent Labs  Lab 12/24/21 1447 12/26/21 1046 12/27/21 0035  NA 137 136 135  K 4.3 4.6 4.9  CL 104 106 105  CO2 23 23 20*  GLUCOSE 146* 148* 125*  BUN 15 20 19   CREATININE 1.18 1.45* 1.22  CALCIUM 8.9 8.6* 8.4*  MG  --   --  2.1   GFR: Estimated Creatinine  Clearance: 72.9 mL/min (by C-G formula based on SCr of 1.22 mg/dL). Liver Function Tests: Recent Labs  Lab 12/26/21 1046 12/27/21 0035  AST 32 48*  ALT 25 25  ALKPHOS 67 61  BILITOT 0.7 0.9  PROT 7.1 6.9  ALBUMIN 3.8 3.8   No results for input(s): LIPASE, AMYLASE in the last 168 hours. No results for input(s): AMMONIA in the last 168 hours. Coagulation Profile: No results for input(s): INR, PROTIME in the last 168 hours. Cardiac Enzymes: No results for input(s): CKTOTAL, CKMB, CKMBINDEX, TROPONINI in the last 168 hours. BNP (last 3 results) Recent Labs    11/13/21 1246  PROBNP 73.0   HbA1C: No results for input(s): HGBA1C in the last 72 hours. CBG: No results for input(s): GLUCAP in the last 168 hours. Lipid Profile: No results for input(s): CHOL, HDL, LDLCALC, TRIG, CHOLHDL, LDLDIRECT in the last 72 hours. Thyroid Function Tests: Recent Labs    12/26/21 1505  TSH 1.286   Anemia Panel: Recent Labs    12/26/21 1505 12/27/21 0559  VITAMINB12 414  --   FOLATE 14.0  --   FERRITIN 922* 820*   Urine analysis:    Component Value Date/Time   COLORURINE YELLOW 01/14/2018 [place] 01/14/2018 1153   LABSPEC 1.018 01/14/2018 1153   PHURINE 6.0 01/14/2018 1153   GLUCOSEU NEGATIVE 01/14/2018 1153   HGBUR NEGATIVE 01/14/2018 1153   BILIRUBINUR NEGATIVE 01/14/2018 1153   BILIRUBINUR NEG 06/02/2013 1454   KETONESUR NEGATIVE 01/14/2018 1153   PROTEINUR NEGATIVE 01/14/2018 1153   UROBILINOGEN 0.2 06/02/2013 1454   NITRITE NEGATIVE 01/14/2018 1153   LEUKOCYTESUR NEGATIVE 01/14/2018 1153   Sepsis Labs: @LABRCNTIP (procalcitonin:4,lacticidven:4)  ) Recent Results (from the past 240 hour(s))  Resp Panel by RT-PCR (Flu A&B, Covid) Nasopharyngeal Swab     Status: Abnormal   Collection Time: 12/24/21  5:00 PM   Specimen: Nasopharyngeal Swab; Nasopharyngeal(NP) swabs in vial transport medium  Result Value Ref Range Status   SARS Coronavirus 2 by RT PCR  POSITIVE (A) NEGATIVE Final    Comment: (NOTE) SARS-CoV-2 target nucleic acids are DETECTED.  The SARS-CoV-2 RNA is generally detectable  in upper respiratory specimens during the acute phase of infection. Positive results are indicative of the presence of the identified virus, but do not rule out bacterial infection or co-infection with other pathogens not detected by the test. Clinical correlation with patient history and other diagnostic information is necessary to determine patient infection status. The expected result is Negative.  Fact Sheet for Patients: EntrepreneurPulse.com.au  Fact Sheet for Healthcare Providers: IncredibleEmployment.be  This test is not yet approved or cleared by the [place] FDA and  has been authorized for detection and/or diagnosis of SARS-CoV-2 by FDA under an Emergency Use Authorization (EUA).  This EUA will remain in effect (meaning this test can be used) for the duration of  the COVID-19 declaration under Section 564(b)(1) of the A ct, 21 U.S.C. section 360bbb-3(b)(1), unless the authorization is terminated or revoked sooner.     Influenza A by PCR NEGATIVE NEGATIVE Final   Influenza B by PCR NEGATIVE NEGATIVE Final    Comment: (NOTE) The Xpert Xpress SARS-CoV-2/FLU/RSV plus assay is intended as an aid in the diagnosis of influenza from Nasopharyngeal swab specimens and should not be used as a sole basis for treatment. Nasal washings and aspirates are unacceptable for Xpert Xpress SARS-CoV-2/FLU/RSV testing.  Fact Sheet for Patients: EntrepreneurPulse.com.au  Fact Sheet for Healthcare Providers: IncredibleEmployment.be  This test is not yet approved or cleared by the [place] FDA and has been authorized for detection and/or diagnosis of SARS-CoV-2 by FDA under an Emergency Use Authorization (EUA). This EUA will remain in effect (meaning this test can be used)  for the duration of the COVID-19 declaration under Section 564(b)(1) of the Act, 21 U.S.C. section 360bbb-3(b)(1), unless the authorization is terminated or revoked.  Performed at [facility], [place] [address], [place], [place] [address]          Radiology Studies: DG Chest 2 View  Result Date: 12/26/2021 CLINICAL DATA:  Shortness of breath EXAM: CHEST - 2 VIEW COMPARISON:  12/24/2021 FINDINGS: Volume loss right hemithorax. No new consolidation or edema. Chronic blunting right costophrenic angle. Stable cardiomediastinal contours. IMPRESSION: No acute process in the chest. Electronically Signed   By: Macy Mis M.D.   On: 12/26/2021 10:25        Scheduled Meds:  baclofen  20 mg Oral BID   folic acid  1 mg Oral Daily   heparin  5,000 Units Subcutaneous Q8H   ipratropium-albuterol  3 mL Nebulization QID   methylPREDNISolone (SOLU-MEDROL) injection  80 mg Intravenous Daily   metoprolol succinate  50 mg Oral QHS   multivitamin with minerals  1 tablet Oral Daily   sodium chloride flush  3 mL Intravenous Q12H   thiamine  100 mg Oral Daily   Continuous Infusions:  remdesivir 100 mg in NS 100 mL 100 mg (12/27/21 1051)     LOS: 1 day   The patient is critically ill with multiple organ systems failure and requires high complexity decision making for assessment and support, frequent evaluation and titration of therapies, application of advanced monitoring technologies and extensive interpretation of multiple databases. Critical Care Time devoted to patient care services described in this note  Time spent: 40 minutes     Kenni Newton, Geraldo Docker, MD [facility]   If 7PM-7AM, please contact night-coverage 12/27/2021, 6:10 PM

## 2021-12-27 NOTE — Progress Notes (Signed)
Chaplain was able to speak with Christian [name]'s daughter who was in the room with him.  [name] was unable to talk on the phone at this time due to issues with breathing.  [name] desires to assign his children as his healthcare agents per daughter.  Chaplain explained because they are next of kin they already have rights if he comes to a place of being unable to speak for himself.  There is no need at this time to complete an AD.   Chaplain provided listening and support.     12/27/21 1100  Clinical Encounter Type  Visited With Patient not available;Family  Visit Type Initial;Social support

## 2021-12-28 ENCOUNTER — Inpatient Hospital Stay (HOSPITAL_COMMUNITY): Payer: PPO

## 2021-12-28 DIAGNOSIS — I2510 Atherosclerotic heart disease of native coronary artery without angina pectoris: Secondary | ICD-10-CM | POA: Diagnosis not present

## 2021-12-28 DIAGNOSIS — I248 Other forms of acute ischemic heart disease: Secondary | ICD-10-CM

## 2021-12-28 DIAGNOSIS — U071 COVID-19: Secondary | ICD-10-CM | POA: Diagnosis not present

## 2021-12-28 DIAGNOSIS — R0603 Acute respiratory distress: Secondary | ICD-10-CM

## 2021-12-28 DIAGNOSIS — F411 Generalized anxiety disorder: Secondary | ICD-10-CM | POA: Diagnosis not present

## 2021-12-28 DIAGNOSIS — I2489 Other forms of acute ischemic heart disease: Secondary | ICD-10-CM | POA: Diagnosis present

## 2021-12-28 DIAGNOSIS — N1831 Chronic kidney disease, stage 3a: Secondary | ICD-10-CM | POA: Diagnosis not present

## 2021-12-28 LAB — COMPREHENSIVE METABOLIC PANEL
ALT: 30 U/L (ref 0–44)
AST: 45 U/L — ABNORMAL HIGH (ref 15–41)
Albumin: 3.7 g/dL (ref 3.5–5.0)
Alkaline Phosphatase: 63 U/L (ref 38–126)
Anion gap: 6 (ref 5–15)
BUN: 27 mg/dL — ABNORMAL HIGH (ref 8–23)
CO2: 23 mmol/L (ref 22–32)
Calcium: 8.6 mg/dL — ABNORMAL LOW (ref 8.9–10.3)
Chloride: 106 mmol/L (ref 98–111)
Creatinine, Ser: 1.21 mg/dL (ref 0.61–1.24)
GFR, Estimated: >60 mL/min (ref >60)
Glucose, Bld: 208 mg/dL — ABNORMAL HIGH (ref 70–99)
Potassium: 4.6 mmol/L (ref 3.5–5.1)
Sodium: 135 mmol/L (ref 135–145)
Total Bilirubin: 0.3 mg/dL (ref 0.3–1.2)
Total Protein: 7 g/dL (ref 6.5–8.1)

## 2021-12-28 LAB — IRON AND TIBC
Iron: 62 ug/dL (ref 45–182)
Saturation Ratios: 30 % (ref 17.9–39.5)
TIBC: 208 ug/dL — ABNORMAL LOW (ref 250–450)
UIBC: 146 ug/dL

## 2021-12-28 LAB — BLOOD GAS, ARTERIAL
Acid-base deficit: 2.6 mmol/L — ABNORMAL HIGH (ref 0.0–2.0)
Allens test (pass/fail): POSITIVE — AB
Bicarbonate: 22.1 mmol/L (ref 20.0–28.0)
Drawn by: 25788
O2 Saturation: 98.8 %
Patient temperature: 98.6
pCO2 arterial: 40.6 mmHg (ref 32.0–48.0)
pH, Arterial: 7.355 (ref 7.350–7.450)
pO2, Arterial: 136 mmHg — ABNORMAL HIGH (ref 83.0–108.0)

## 2021-12-28 LAB — CBC WITH DIFFERENTIAL/PLATELET
Abs Immature Granulocytes: 0.08 10*3/uL — ABNORMAL HIGH (ref 0.00–0.07)
Basophils Absolute: 0 10*3/uL (ref 0.0–0.1)
Basophils Relative: 0 %
Eosinophils Absolute: 0 10*3/uL (ref 0.0–0.5)
Eosinophils Relative: 0 %
HCT: 37 % — ABNORMAL LOW (ref 39.0–52.0)
Hemoglobin: 11.8 g/dL — ABNORMAL LOW (ref 13.0–17.0)
Immature Granulocytes: 1 %
Lymphocytes Relative: 6 %
Lymphs Abs: 0.8 10*3/uL (ref 0.7–4.0)
MCH: 34.7 pg — ABNORMAL HIGH (ref 26.0–34.0)
MCHC: 31.9 g/dL (ref 30.0–36.0)
MCV: 108.8 fL — ABNORMAL HIGH (ref 80.0–100.0)
Monocytes Absolute: 0.9 10*3/uL (ref 0.1–1.0)
Monocytes Relative: 7 %
Neutro Abs: 11.1 10*3/uL — ABNORMAL HIGH (ref 1.7–7.7)
Neutrophils Relative %: 86 %
Platelets: 143 10*3/uL — ABNORMAL LOW (ref 150–400)
RBC: 3.4 MIL/uL — ABNORMAL LOW (ref 4.22–5.81)
RDW: 14.4 % (ref 11.5–15.5)
WBC: 12.9 10*3/uL — ABNORMAL HIGH (ref 4.0–10.5)
nRBC: 0.2 % (ref 0.0–0.2)

## 2021-12-28 LAB — RETICULOCYTES
Immature Retic Fract: 14.6 % (ref 2.3–15.9)
RBC.: 3.4 MIL/uL — ABNORMAL LOW (ref 4.22–5.81)
Retic Count, Absolute: 34.3 10*3/uL (ref 19.0–186.0)
Retic Ct Pct: 1 % (ref 0.4–3.1)

## 2021-12-28 LAB — SEDIMENTATION RATE: Sed Rate: 54 mm/hr — ABNORMAL HIGH (ref 0–16)

## 2021-12-28 LAB — PROTEIN ELECTROPHORESIS, SERUM
A/G Ratio: 1.2 (ref 0.7–1.7)
Albumin ELP: 3.4 g/dL (ref 2.9–4.4)
Alpha-1-Globulin: 0.3 g/dL (ref 0.0–0.4)
Alpha-2-Globulin: 0.7 g/dL (ref 0.4–1.0)
Beta Globulin: 0.9 g/dL (ref 0.7–1.3)
Gamma Globulin: 1 g/dL (ref 0.4–1.8)
Globulin, Total: 2.8 g/dL (ref 2.2–3.9)
Total Protein ELP: 6.2 g/dL (ref 6.0–8.5)

## 2021-12-28 LAB — COPPER, SERUM: Copper: 111 ug/dL (ref 69–132)

## 2021-12-28 LAB — VITAMIN B12: Vitamin B-12: 396 pg/mL (ref 180–914)

## 2021-12-28 LAB — MAGNESIUM: Magnesium: 2.5 mg/dL — ABNORMAL HIGH (ref 1.7–2.4)

## 2021-12-28 LAB — C-REACTIVE PROTEIN: CRP: 12.2 mg/dL — ABNORMAL HIGH (ref <1.0)

## 2021-12-28 LAB — FERRITIN
Ferritin: 808 ng/mL — ABNORMAL HIGH (ref 24–336)
Ferritin: 839 ng/mL — ABNORMAL HIGH (ref 24–336)

## 2021-12-28 LAB — D-DIMER, QUANTITATIVE: D-Dimer, Quant: 0.93 ug/mL-FEU — ABNORMAL HIGH (ref 0.00–0.50)

## 2021-12-28 LAB — ANA W/REFLEX IF POSITIVE: Anti Nuclear Antibody (ANA): NEGATIVE

## 2021-12-28 LAB — ENDOMYSIAL IGA ANTIBODY: Endomysial Ab, IgA: NEGATIVE

## 2021-12-28 LAB — FOLATE: Folate: 14 ng/mL (ref >5.9)

## 2021-12-28 LAB — LACTATE DEHYDROGENASE: LDH: 178 U/L (ref 98–192)

## 2021-12-28 MED ORDER — MORPHINE SULFATE (PF) 2 MG/ML IV SOLN
1.0000 mg | INTRAVENOUS | Status: DC | PRN
  Administered 2021-12-28 – 2021-12-30 (×5): 1 mg via INTRAVENOUS
  Filled 2021-12-28 (×5): qty 1

## 2021-12-28 MED ORDER — SODIUM CHLORIDE 0.9 % IV BOLUS: 500.0000 mL | Freq: Once | INTRAVENOUS | Status: DC | PRN

## 2021-12-28 MED ORDER — CLONAZEPAM 0.5 MG PO TABS
0.5000 mg | ORAL_TABLET | Freq: Two times a day (BID) | ORAL | Status: DC
  Administered 2021-12-29 – 2022-01-04 (×13): 0.5 mg via ORAL
  Filled 2021-12-28 (×12): qty 1

## 2021-12-28 MED ORDER — METOPROLOL SUCCINATE ER 25 MG PO TB24
25.0000 mg | ORAL_TABLET | Freq: Every day | ORAL | Status: DC
  Administered 2021-12-30 – 2022-01-03 (×5): 25 mg via ORAL
  Filled 2021-12-28 (×7): qty 1

## 2021-12-28 MED ORDER — IPRATROPIUM-ALBUTEROL 0.5-2.5 (3) MG/3ML IN SOLN
3.0000 mL | Freq: Three times a day (TID) | RESPIRATORY_TRACT | Status: DC
  Administered 2021-12-28 – 2021-12-30 (×5): 3 mL via RESPIRATORY_TRACT
  Filled 2021-12-28 (×6): qty 3

## 2021-12-28 NOTE — Progress Notes (Signed)
Pt is injury-free, afebrile, alert, and oriented X 4. Vital signs were within the baseline during this shift. No panic attacks witnessed or reported during this shift. Head CT completed. Pt denies chest pain, nausea, vomiting, dizziness, signs or symptoms of bleeding or acute changes during this shift. We will continue to monitor and work toward achieving the care plan goals

## 2021-12-28 NOTE — Progress Notes (Signed)
Patient declines nocturnal CPAP. He states he has not been able to tolerate CPAP over the past few months or so even at home, since his last radiation.

## 2021-12-28 NOTE — Significant Event (Signed)
Rapid Response Event Note   Reason for Call :  Hypotensive event   Initial Focused Assessment:  Patient alert oriented x 4, noted to have Chorea-like movements.  Primary nurse reports increased WOB, and that B/P is low. However, it is difficult to obtain due to the Chorea-like movements.    Interventions:  Duoneb given per Nursing Bolus 500 mL x 1.    Plan of Care:  Dr. Clydene Laming at bedside.  ABG CXR   MD Notified:  Dr. Clydene Laming  Call Time:  Gas Time: 1633  End Time: Christian Mical Kicklighter, RN

## 2021-12-28 NOTE — Progress Notes (Signed)
PROGRESS NOTE    Christian [name]  ZOX:[id number] DOB: 08/18/1946 DOA: 12/26/2021 PCP: [name], MD   Brief Narrative:  Mr. Renwick is a 76 yo WM PMHx NSCLC s/p RML/RLL bilobectomies with LN dissection and s/p SBRT to L&R adrenal glands (July 2020 and Feb 20222 respectively).   Presented to the ER with worsening shortness of breath at home.  He has also been undergoing outpatient referral to pulmonology to see about possibly being started on home oxygen however has not had any documented hypoxia. He was last seen by oncology in October 2022. He was recently evaluated in the ER on 12/24/2021 with similar symptoms and his COVID testing from that time was positive. He was noted to be tachypneic on evaluation but no hypoxia.  He was however placed on oxygen for comfort in the ER. In addition he was noted to have a large amounts of uncontrolled irregular movements during evaluation.  His daughter noted that these have been present for approximately 1 to 2 years however have worsened once his COVID symptoms started.  They have not yet sought any further evaluation for this.   I have personally briefly reviewed patient's old medical records in [facility] and discussed patient with the ER provider when appropriate/indicated   Subjective: 1/20 A/O x4, joking with staff.  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  Patient states had all of his surgery in the [facility] system approximately 4 years ago.  Consisted of RLL resection, radiation.  States additional LLL radiation.  Patient does not know name of surgeon, or if he had pulmonologist.  States feels that his breathing has worsened since LLL radiation which he he had recently (unsure of exact date).   Assessment & Plan:  Covid vaccination; vaccinated 3/4  Principal Problem:   COVID-19 virus infection Active Problems:   Anxiety state   Essential hypertension, benign   CAD (coronary artery disease)   OSA on  CPAP   Chorea   Chronic kidney disease, stage 3a (HCC)   Elevated troponin   Macrocytic anemia   Obesity, Class II, BMI 35-39.9   Pneumonia due to COVID-19 virus   Demand ischemia (HCC)  COVID-19 virus infection/COVID pneumonia- (present on admission) - at risk for progression given lung malignancy history and co-morbidities -1/18 remdesivir per pharmacy protocol - 1/19 overnight patient became hypoxic now on O2.  We will start steroids. - 1/19 Solu-Medrol 80 mg daily - DuoNeb QID - Incentive spirometry - Flutter valve -Vitamins per COVID protocol (folic acid, thiamine, multivitamin.) - Continue to trend inflammatory markers -Will want to maintain patient euvolemic to slightly dry -1/20 clonazepam 0.5 mg BID for anxiety - 1/20 Morphine 1 mg PRN feeling of SOB  COVID-19 Labs  Recent Labs    12/26/21 1505 12/27/21 0035 12/27/21 0559 12/28/21 0449  DDIMER 1.17* 1.11*  --  0.93*  FERRITIN 922*  --  820* 839*   808*  LDH 163 289*  --  178  CRP 5.0* 7.5*  --  12.2*    Lab Results  Component Value Date   SARSCOV2NAA POSITIVE (A) 12/24/2021   [place] NEGATIVE 07/11/2021   [place] NEGATIVE 06/07/2019      Chorea - Involuntary, rapid, and variable movements appreciated during exam in the ER on admission; these appear concerning for Chorea-like movements and are worsening per daughter after development of COVID symptoms.  He has not had any prior work-up at this time.  Symptoms have been ongoing for  approximately 1 to 2 years per daughter. -Per patient and son worsen when patient has acute illness.    Chronic kidney disease, stage 3a (HCC) (baseline  CR ~ 1.2 - 1.3) Lab Results  Component Value Date   CREATININE 1.21 12/28/2021   CREATININE 1.22 12/27/2021   CREATININE 1.45 (H) 12/26/2021   CREATININE 1.18 12/24/2021   CREATININE 1.24 11/13/2021  -Strict in and out - Daily weight -Back to baseline   Elevated troponin/Demand ischemia - suspect some demand  ischemia related; also renal function slightly worse - trend trop  Latest Reference Range & Units 12/26/21 10:46 12/26/21 12:26 12/26/21 21:07 12/27/21 00:35 12/27/21 05:59  Troponin I (High Sensitivity) <18 ng/L 79 (H) 77 (H) 71 (H) 81 (H) 80 (H)  (H): Data is abnormally high  - EKG reviewed no ischemia changes and patient CP free   Mixed Anemia - Baseline hemoglobin 11 to 12 g/dL - Currently at baseline - MCV 106 - 1/19 Anemia Panel; most consistent with anemia of chronic disease.  Given elevated MCV most likely a mixed anemia   OSA on CPAP - nightly CPAP if used at home  Essential HTN- (present on admission) - Holding lisinopril in setting of renal function - Use labetalol or hydralazine as needed -1/20 decrease Toprol 25 mg daily   CAD (coronary artery disease)- (present on admission) -See essential HTN   Anxiety state- (present on admission) -1/20 clonazepam 0.5 mg BID   Obesity class II (BMI 36.62 kg/m) - Once patient's current acute issues resolved counseled to address with PCP     DVT prophylaxis: Subcu heparin Code Status: Full Family Communication: 1/20 son at bedside for discussion of plan of care all questions answered Status is: Inpatient    Dispo: The patient is from: Home              Anticipated d/c is to: Home              Anticipated d/c date is: > 3 days              Patient currently is not medically stable to d/c.      Consultants:  Neurology    Procedures/Significant Events:     I have personally reviewed and interpreted all radiology studies and my findings are as above.  VENTILATOR SETTINGS: Nasal cannula 1/20 Flow 3 L/min SPO2 98%   Cultures    Antimicrobials: Anti-infectives (From admission, onward)    Start     Ordered Stop   12/27/21 1000  remdesivir 100 mg in sodium chloride 0.9 % 100 mL IVPB       See Hyperspace for full Linked Orders Report.   12/26/21 1254 12/31/21 0959   12/26/21 1400  remdesivir 200 mg in  sodium chloride 0.9% 250 mL IVPB       See Hyperspace for full Linked Orders Report.   12/26/21 1254 12/26/21 1600         Devices    LINES / TUBES:      Continuous Infusions:  remdesivir 100 mg in NS 100 mL 100 mg (12/28/21 1025)   sodium chloride       Objective: Vitals:   12/28/21 0600 12/28/21 0734 12/28/21 1129 12/28/21 1200  BP: 115/82   117/86  Pulse: 69   72  Resp: 20   (!) 21  Temp: 98.9 F (37.2 C)   98.6 F (37 C)  TempSrc:    Oral  SpO2: 98% (S) 98% 98% 97%  Weight:  Height:        Intake/Output Summary (Last 24 hours) at 12/28/2021 1734 Last data filed at 12/28/2021 1500 Gross per 24 hour  Intake 200 ml  Output --  Net 200 ml   Filed Weights   12/26/21 0934  Weight: 122.5 kg    Examination:  General: A/O x4, positive acute respiratory distress Eyes: negative scleral hemorrhage, negative anisocoria, negative icterus ENT: Negative Runny nose, negative gingival bleeding, Neck:  Negative scars, masses, torticollis, lymphadenopathy, JVD Lungs: Clear to auscultation bilaterally, positive diffuse expiratory wheezes  Cardiovascular: Regular rate and rhythm without murmur gallop or rub normal S1 and S2 Abdomen: MORBIDLY OBESE, negative abdominal pain, nondistended, positive soft, bowel sounds, no rebound, no ascites, no appreciable mass Extremities: No significant cyanosis, clubbing, or edema bilateral lower extremities Skin: Negative rashes, lesions, ulcers Psychiatric:  Negative depression, negative anxiety, negative fatigue, negative mania  Central nervous system:  Cranial nerves II through XII intact, tongue/uvula midline, all extremities muscle strength 5/5, sensation intact throughout, negative dysarthria, negative expressive aphasia, negative receptive aphasia.  Positive Chorea   .     Data Reviewed: Care during the described time interval was provided by me .  I have reviewed this patient's available data, including medical history,  events of note, physical examination, and all test results as part of my evaluation.   CBC: Recent Labs  Lab 12/24/21 1447 12/26/21 1046 12/27/21 0035 12/28/21 0449  WBC 5.1 7.9 8.1 12.9*  NEUTROABS 3.0 5.6 6.2 11.1*  HGB 11.4* 11.4* 10.8* 11.8*  HCT 35.8* 34.7* 33.7* 37.0*  MCV 107.2* 106.1* 108.7* 108.8*  PLT 120* 127* 121* 366*   Basic Metabolic Panel: Recent Labs  Lab 12/24/21 1447 12/26/21 1046 12/27/21 0035 12/28/21 0449  NA 137 136 135 135  K 4.3 4.6 4.9 4.6  CL 104 106 105 106  CO2 23 23 20* 23  GLUCOSE 146* 148* 125* 208*  BUN 15 20 19  27*  CREATININE 1.18 1.45* 1.22 1.21  CALCIUM 8.9 8.6* 8.4* 8.6*  MG  --   --  2.1 2.5*   GFR: Estimated Creatinine Clearance: 73.5 mL/min (by C-G formula based on SCr of 1.21 mg/dL). Liver Function Tests: Recent Labs  Lab 12/26/21 1046 12/27/21 0035 12/28/21 0449  AST 32 48* 45*  ALT 25 25 30   ALKPHOS 67 61 63  BILITOT 0.7 0.9 0.3  PROT 7.1 6.9 7.0  ALBUMIN 3.8 3.8 3.7   No results for input(s): LIPASE, AMYLASE in the last 168 hours. No results for input(s): AMMONIA in the last 168 hours. Coagulation Profile: No results for input(s): INR, PROTIME in the last 168 hours. Cardiac Enzymes: No results for input(s): CKTOTAL, CKMB, CKMBINDEX, TROPONINI in the last 168 hours. BNP (last 3 results) Recent Labs    11/13/21 1246  PROBNP 73.0   HbA1C: No results for input(s): HGBA1C in the last 72 hours. CBG: No results for input(s): GLUCAP in the last 168 hours. Lipid Profile: No results for input(s): CHOL, HDL, LDLCALC, TRIG, CHOLHDL, LDLDIRECT in the last 72 hours. Thyroid Function Tests: Recent Labs    12/26/21 1505  TSH 1.286   Anemia Panel: Recent Labs    12/26/21 1505 12/27/21 0559 12/28/21 0449  VITAMINB12 414  --  396  FOLATE 14.0  --  14.0  FERRITIN 922* 820* 839*   808*  TIBC  --   --  208*  IRON  --   --  62  RETICCTPCT  --   --  1.0  Urine analysis:    Component Value Date/Time    COLORURINE YELLOW 01/14/2018 [place] 01/14/2018 1153   LABSPEC 1.018 01/14/2018 1153   PHURINE 6.0 01/14/2018 1153   GLUCOSEU NEGATIVE 01/14/2018 1153   HGBUR NEGATIVE 01/14/2018 1153   BILIRUBINUR NEGATIVE 01/14/2018 1153   BILIRUBINUR NEG 06/02/2013 1454   KETONESUR NEGATIVE 01/14/2018 1153   PROTEINUR NEGATIVE 01/14/2018 1153   UROBILINOGEN 0.2 06/02/2013 1454   NITRITE NEGATIVE 01/14/2018 1153   LEUKOCYTESUR NEGATIVE 01/14/2018 1153   Sepsis Labs: @LABRCNTIP (procalcitonin:4,lacticidven:4)  ) Recent Results (from the past 240 hour(s))  Resp Panel by RT-PCR (Flu A&B, Covid) Nasopharyngeal Swab     Status: Abnormal   Collection Time: 12/24/21  5:00 PM   Specimen: Nasopharyngeal Swab; Nasopharyngeal(NP) swabs in vial transport medium  Result Value Ref Range Status   SARS Coronavirus 2 by RT PCR POSITIVE (A) NEGATIVE Final    Comment: (NOTE) SARS-CoV-2 target nucleic acids are DETECTED.  The SARS-CoV-2 RNA is generally detectable in upper respiratory specimens during the acute phase of infection. Positive results are indicative of the presence of the identified virus, but do not rule out bacterial infection or co-infection with other pathogens not detected by the test. Clinical correlation with patient history and other diagnostic information is necessary to determine patient infection status. The expected result is Negative.  Fact Sheet for Patients: EntrepreneurPulse.com.au  Fact Sheet for Healthcare Providers: IncredibleEmployment.be  This test is not yet approved or cleared by the [place] FDA and  has been authorized for detection and/or diagnosis of SARS-CoV-2 by FDA under an Emergency Use Authorization (EUA).  This EUA will remain in effect (meaning this test can be used) for the duration of  the COVID-19 declaration under Section 564(b)(1) of the A ct, 21 U.S.C. section 360bbb-3(b)(1), unless the  authorization is terminated or revoked sooner.     Influenza A by PCR NEGATIVE NEGATIVE Final   Influenza B by PCR NEGATIVE NEGATIVE Final    Comment: (NOTE) The Xpert Xpress SARS-CoV-2/FLU/RSV plus assay is intended as an aid in the diagnosis of influenza from Nasopharyngeal swab specimens and should not be used as a sole basis for treatment. Nasal washings and aspirates are unacceptable for Xpert Xpress SARS-CoV-2/FLU/RSV testing.  Fact Sheet for Patients: EntrepreneurPulse.com.au  Fact Sheet for Healthcare Providers: IncredibleEmployment.be  This test is not yet approved or cleared by the [place] FDA and has been authorized for detection and/or diagnosis of SARS-CoV-2 by FDA under an Emergency Use Authorization (EUA). This EUA will remain in effect (meaning this test can be used) for the duration of the COVID-19 declaration under Section 564(b)(1) of the Act, 21 U.S.C. section 360bbb-3(b)(1), unless the authorization is terminated or revoked.  Performed at [facility], [place] [address] [place], [place] [address]          Radiology Studies: CT HEAD WO CONTRAST (5MM)  Result Date: 12/27/2021 CLINICAL DATA:  Neuro deficit, stroke suspected, choreiform movements. Evaluate for old basal ganglia stroke. EXAM: CT HEAD WITHOUT CONTRAST TECHNIQUE: Contiguous axial images were obtained from the base of the skull through the vertex without intravenous contrast. RADIATION DOSE REDUCTION: This exam was performed according to the departmental dose-optimization program which includes automated exposure control, adjustment of the mA and/or kV according to patient size and/or use of iterative reconstruction technique. COMPARISON:  01/01/2018. FINDINGS: Brain: No acute intracranial hemorrhage, midline shift or mass effect. No extra-axial fluid collection. Diffuse atrophy is noted. Subcortical and periventricular white matter hypodensities  are  present bilaterally. No hydrocephalus. Vascular: Atherosclerotic calcification of the carotid siphons. No hyperdense vessel. Skull: Normal. Negative for fracture or focal lesion. Sinuses/Orbits: Mucosal thickening is present in the ethmoid air cells, maxillary sinuses, and sphenoid sinuses bilaterally and frontal sinus on the left. No acute orbital abnormality. Other: There is opacification of the mastoid air cells and middle ear on the left. IMPRESSION: 1. No acute intracranial process. 2. Atrophy with chronic microvascular ischemic changes. 3. Paranasal sinus disease. 4. Opacification of the mastoid air cells and middle ear on the left, possible mastoiditis versus cholesteatoma. Electronically Signed   By: Brett Fairy M.D.   On: 12/27/2021 22:01   DG Chest Port 1 View  Result Date: 12/28/2021 CLINICAL DATA:  Acute respiratory distress. EXAM: PORTABLE CHEST 1 VIEW COMPARISON:  12/26/2021 FINDINGS: 0453 hours. Low volume film. The cardio pericardial silhouette is enlarged. There is pulmonary vascular congestion without overt pulmonary edema. Airspace disease at the right base is suspicious for pneumonia in there is a tiny right pleural effusion. Bones are diffusely demineralized. IMPRESSION: Low volume film with right base pneumonia and tiny right pleural effusion. Electronically Signed   By: Misty Stanley M.D.   On: 12/28/2021 17:28        Scheduled Meds:  baclofen  20 mg Oral BID   folic acid  1 mg Oral Daily   heparin  5,000 Units Subcutaneous Q8H   ipratropium-albuterol  3 mL Nebulization TID   methylPREDNISolone (SOLU-MEDROL) injection  80 mg Intravenous Daily   metoprolol succinate  50 mg Oral QHS   multivitamin with minerals  1 tablet Oral Daily   sodium chloride flush  3 mL Intravenous Q12H   thiamine  100 mg Oral Daily   Continuous Infusions:  remdesivir 100 mg in NS 100 mL 100 mg (12/28/21 1025)   sodium chloride       LOS: 2 days   The patient is critically ill with multiple  organ systems failure and requires high complexity decision making for assessment and support, frequent evaluation and titration of therapies, application of advanced monitoring technologies and extensive interpretation of multiple databases. Critical Care Time devoted to patient care services described in this note  Time spent: 40 minutes     Sokha Craker, Geraldo Docker, MD [facility]   If 7PM-7AM, please contact night-coverage 12/28/2021, 5:34 PM

## 2021-12-28 NOTE — Progress Notes (Signed)
°   12/28/21 2126  Assess: MEWS Score  Temp 97.7 F (36.5 C)  BP (!) 102/53  Pulse Rate 70  ECG Heart Rate 70  Resp (!) 29  Level of Consciousness Alert  SpO2 98 %  O2 Device Nasal Cannula  Assess: MEWS Score  MEWS Temp 0  MEWS Systolic 0  MEWS Pulse 0  MEWS RR 2  MEWS LOC 0  MEWS Score 2  MEWS Score Color Yellow  Assess: if the MEWS score is Yellow or Red  Were vital signs taken at a resting state? Yes  Focused Assessment No change from prior assessment  Does the patient meet 2 or more of the SIRS criteria? No  Does the patient have a confirmed or suspected source of infection? Yes  Provider and Rapid Response Notified? No  MEWS guidelines implemented *See Row Information* No, previously yellow, continue vital signs every 4 hours  Treat  MEWS Interventions Other (Comment) (at baseline for pt)  Pain Scale 0-10  Pain Score 0  Complains of Anxiety  Interventions Medication (see MAR)  Take Vital Signs  Increase Vital Sign Frequency  Yellow: Q 2hr X 2 then Q 4hr X 2, if remains yellow, continue Q 4hrs  Escalate  MEWS: Escalate Yellow: discuss with charge nurse/RN and consider discussing with provider and RRT  Notify: Provider  Provider Name/Title Jeannette Corpus, NP  Date Provider Notified 12/28/21  Time Provider Notified 2130  Notification Type Page (chat)  Notification Reason Other (Comment) (yellow MEWS)  Provider response No new orders  Document  Patient Outcome Stabilized after interventions  Progress note created (see row info) Yes  Assess: SIRS CRITERIA  SIRS Temperature  0  SIRS Pulse 0  SIRS Respirations  1  SIRS WBC 0  SIRS Score Sum  1

## 2021-12-29 ENCOUNTER — Encounter (HOSPITAL_COMMUNITY): Payer: Self-pay | Admitting: Internal Medicine

## 2021-12-29 DIAGNOSIS — U071 COVID-19: Secondary | ICD-10-CM | POA: Diagnosis not present

## 2021-12-29 DIAGNOSIS — I2510 Atherosclerotic heart disease of native coronary artery without angina pectoris: Secondary | ICD-10-CM | POA: Diagnosis not present

## 2021-12-29 DIAGNOSIS — F411 Generalized anxiety disorder: Secondary | ICD-10-CM | POA: Diagnosis not present

## 2021-12-29 DIAGNOSIS — N1831 Chronic kidney disease, stage 3a: Secondary | ICD-10-CM | POA: Diagnosis not present

## 2021-12-29 LAB — CBC WITH DIFFERENTIAL/PLATELET
Abs Immature Granulocytes: 0.06 10*3/uL (ref 0.00–0.07)
Basophils Absolute: 0 10*3/uL (ref 0.0–0.1)
Basophils Relative: 0 %
Eosinophils Absolute: 0 10*3/uL (ref 0.0–0.5)
Eosinophils Relative: 0 %
HCT: 34.1 % — ABNORMAL LOW (ref 39.0–52.0)
Hemoglobin: 10.9 g/dL — ABNORMAL LOW (ref 13.0–17.0)
Immature Granulocytes: 1 %
Lymphocytes Relative: 5 %
Lymphs Abs: 0.5 10*3/uL — ABNORMAL LOW (ref 0.7–4.0)
MCH: 34.3 pg — ABNORMAL HIGH (ref 26.0–34.0)
MCHC: 32 g/dL (ref 30.0–36.0)
MCV: 107.2 fL — ABNORMAL HIGH (ref 80.0–100.0)
Monocytes Absolute: 0.7 10*3/uL (ref 0.1–1.0)
Monocytes Relative: 6 %
Neutro Abs: 10.3 10*3/uL — ABNORMAL HIGH (ref 1.7–7.7)
Neutrophils Relative %: 88 %
Platelets: 132 10*3/uL — ABNORMAL LOW (ref 150–400)
RBC: 3.18 MIL/uL — ABNORMAL LOW (ref 4.22–5.81)
RDW: 14.7 % (ref 11.5–15.5)
WBC: 11.7 10*3/uL — ABNORMAL HIGH (ref 4.0–10.5)
nRBC: 0.2 % (ref 0.0–0.2)

## 2021-12-29 LAB — ANA W/REFLEX IF POSITIVE: Anti Nuclear Antibody (ANA): NEGATIVE

## 2021-12-29 LAB — LACTATE DEHYDROGENASE: LDH: 153 U/L (ref 98–192)

## 2021-12-29 LAB — COMPREHENSIVE METABOLIC PANEL
ALT: 32 U/L (ref 0–44)
AST: 38 U/L (ref 15–41)
Albumin: 3.3 g/dL — ABNORMAL LOW (ref 3.5–5.0)
Alkaline Phosphatase: 55 U/L (ref 38–126)
Anion gap: 6 (ref 5–15)
BUN: 33 mg/dL — ABNORMAL HIGH (ref 8–23)
CO2: 25 mmol/L (ref 22–32)
Calcium: 8.9 mg/dL (ref 8.9–10.3)
Chloride: 106 mmol/L (ref 98–111)
Creatinine, Ser: 1.15 mg/dL (ref 0.61–1.24)
GFR, Estimated: >60 mL/min (ref >60)
Glucose, Bld: 242 mg/dL — ABNORMAL HIGH (ref 70–99)
Potassium: 5 mmol/L (ref 3.5–5.1)
Sodium: 137 mmol/L (ref 135–145)
Total Bilirubin: 0.5 mg/dL (ref 0.3–1.2)
Total Protein: 6.4 g/dL — ABNORMAL LOW (ref 6.5–8.1)

## 2021-12-29 LAB — C-REACTIVE PROTEIN: CRP: 7.2 mg/dL — ABNORMAL HIGH (ref <1.0)

## 2021-12-29 LAB — FERRITIN: Ferritin: 691 ng/mL — ABNORMAL HIGH (ref 24–336)

## 2021-12-29 LAB — SEDIMENTATION RATE: Sed Rate: 50 mm/hr — ABNORMAL HIGH (ref 0–16)

## 2021-12-29 LAB — MAGNESIUM: Magnesium: 2.4 mg/dL (ref 1.7–2.4)

## 2021-12-29 LAB — D-DIMER, QUANTITATIVE: D-Dimer, Quant: 0.66 ug/mL-FEU — ABNORMAL HIGH (ref 0.00–0.50)

## 2021-12-29 MED ORDER — ORAL CARE MOUTH RINSE
15.0000 mL | Freq: Two times a day (BID) | OROMUCOSAL | Status: DC
  Administered 2021-12-29 – 2022-01-04 (×13): 15 mL via OROMUCOSAL

## 2021-12-29 MED ORDER — TRAZODONE HCL 50 MG PO TABS
50.0000 mg | ORAL_TABLET | Freq: Every day | ORAL | Status: DC
  Administered 2021-12-29 – 2021-12-31 (×3): 50 mg via ORAL
  Filled 2021-12-29 (×3): qty 1

## 2021-12-29 NOTE — Progress Notes (Addendum)
PROGRESS NOTE    Christian [name]  QIO:[id number] DOB: 30-Dec-1945 DOA: 12/26/2021 PCP: [name], MD   Brief Narrative:  Christian [name] is a 76 yo WM PMHx NSCLC s/p RML/RLL bilobectomies with LN dissection and s/p SBRT to L&R adrenal glands (July 2020 and Feb 20222 respectively).   Presented to the ER with worsening shortness of breath at home.  He has also been undergoing outpatient referral to pulmonology to see about possibly being started on home oxygen however has not had any documented hypoxia. He was last seen by oncology in October 2022. He was recently evaluated in the ER on 12/24/2021 with similar symptoms and his COVID testing from that time was positive. He was noted to be tachypneic on evaluation but no hypoxia.  He was however placed on oxygen for comfort in the ER. In addition he was noted to have a large amounts of uncontrolled irregular movements during evaluation.  His daughter noted that these have been present for approximately 1 to 2 years however have worsened once his COVID symptoms started.  They have not yet sought any further evaluation for this.   I have personally briefly reviewed patient's old medical records in [facility] and discussed patient with the ER provider when appropriate/indicated   Subjective: 1/21 afebrile overnight A/O x4, continued SOB extensive chart review shows that patient respiratory status has never been addressed..  Patient has received multiple doses of radiation treatment to [place] and bilateral adrenal glands secondary to lung cancer and metastatic lung cancer, appears last dose in November 2022..   Assessment & Plan:  Covid vaccination; vaccinated 3/4  Principal Problem:   COVID-19 virus infection Active Problems:   Anxiety state   Essential hypertension, benign   CAD (coronary artery disease)   OSA on CPAP   Chorea   Chronic kidney disease, stage 3a (HCC)   Elevated troponin   Macrocytic anemia   Obesity, Class II, BMI  35-39.9   Pneumonia due to COVID-19 virus   Demand ischemia (HCC)  COVID-19 virus infection/COVID pneumonia- (present on admission) - at risk for progression given lung malignancy history and co-morbidities -1/18 remdesivir per pharmacy protocol - 1/19 overnight patient became hypoxic now on O2.  We will start steroids. - 1/19 Solu-Medrol 80 mg daily - DuoNeb QID - Incentive spirometry - Flutter valve -Vitamins per COVID protocol (folic acid, thiamine, multivitamin.) - Continue to trend inflammatory markers -Will want to maintain patient euvolemic to slightly dry -1/20 clonazepam 0.5 mg BID for anxiety - 1/20 Morphine 1 mg PRN feeling of SOB  COVID-19 Labs  Recent Labs    12/27/21 0035 12/27/21 0559 12/28/21 0449 12/29/21 0513  DDIMER 1.11*  --  0.93* 0.66*  FERRITIN  --  820* 839*   808* 691*  LDH 289*  --  178 153  CRP 7.5*  --  12.2* 7.2*     Lab Results  Component Value Date   SARSCOV2NAA POSITIVE (A) 12/24/2021   [place] NEGATIVE 07/11/2021   [place] NEGATIVE 06/07/2019     Hx Metastatic non-small cell lung cancer, adenocarcinoma initially diagnosed as stage IB (T2a, N0, M0) RIGHT lower lobe lung mass.   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 -January 16, 2018 S/p RIGHT middle and lower bilobectomies with lymph node Dr. Roxan Hockey cardiothoracic surgery   -July 2020 s/p SBRT to the metastatic lesion in the LEFT adrenal gland by Dr. Sondra Come radiation oncology. -February 2022 SBRT RIGHT adrenal gland mass by Dr. Sondra Come . -  1/21 notified Dr. Lorna Few oncology of admission via epic.  Patient has been complaining for the last 2 years of increasing respiratory distress.   Chorea - Involuntary, rapid, and variable movements appreciated during exam in the ER on admission; these appear concerning for Chorea-like movements and are worsening per daughter after development of COVID symptoms.  He has not had any prior work-up at this time.   Symptoms have been ongoing for approximately 1 to 2 years per daughter. -Per patient and son worsen when patient has acute illness.    Chronic kidney disease, stage 3a (HCC) (baseline  CR ~ 1.2 - 1.3) Lab Results  Component Value Date   CREATININE 1.15 12/29/2021   CREATININE 1.21 12/28/2021   CREATININE 1.22 12/27/2021   CREATININE 1.45 (H) 12/26/2021   CREATININE 1.18 12/24/2021  -Strict in and out - Daily weight -Back to baseline   Elevated troponin/Demand ischemia - suspect some demand ischemia related; also renal function slightly worse - trend trop  Latest Reference Range & Units 12/26/21 10:46 12/26/21 12:26 12/26/21 21:07 12/27/21 00:35 12/27/21 05:59  Troponin I (High Sensitivity) <18 ng/L 79 (H) 77 (H) 71 (H) 81 (H) 80 (H)  (H): Data is abnormally high  - EKG reviewed no ischemia changes and patient CP free   Mixed Anemia - Baseline hemoglobin 11 to 12 g/dL - Currently at baseline - MCV 106 - 1/19 Anemia Panel; most consistent with anemia of chronic disease.  Given elevated MCV most likely a mixed anemia   OSA on CPAP - nightly CPAP if used at home -1/21 trazodone 50 mg  Essential HTN- (present on admission) - Holding lisinopril in setting of renal function - Use labetalol or hydralazine as needed -1/20 decrease Toprol 25 mg daily   CAD (coronary artery disease)- (present on admission) -See essential HTN   Anxiety state- (present on admission) -1/20 clonazepam 0.5 mg BID   Obesity class II (BMI 36.62 kg/m) - Once patient's current acute issues resolved counseled to address with PCP     DVT prophylaxis: Subcu heparin Code Status: Full Family Communication: 1/21 son at bedside for discussion of plan of care all questions answered Status is: Inpatient    Dispo: The patient is from: Home              Anticipated d/c is to: Home              Anticipated d/c date is: > 3 days              Patient currently is not medically stable to  d/c.      Consultants:  Neurology    Procedures/Significant Events:     I have personally reviewed and interpreted all radiology studies and my findings are as above.  VENTILATOR SETTINGS: Nasal cannula 1/20 Flow 3 L/min SPO2 98%   Cultures    Antimicrobials: Anti-infectives (From admission, onward)    Start     Ordered Stop   12/27/21 1000  remdesivir 100 mg in sodium chloride 0.9 % 100 mL IVPB       See Hyperspace for full Linked Orders Report.   12/26/21 1254 12/31/21 0959   12/26/21 1400  remdesivir 200 mg in sodium chloride 0.9% 250 mL IVPB       See Hyperspace for full Linked Orders Report.   12/26/21 1254 12/26/21 1600         Devices    LINES / TUBES:      Continuous Infusions:  remdesivir  100 mg in NS 100 mL 100 mg (12/28/21 1025)   sodium chloride       Objective: Vitals:   12/29/21 0100 12/29/21 0345 12/29/21 0730 12/29/21 0822  BP:  (!) 136/59 (!) 118/98   Pulse:  66 76   Resp: 14 18    Temp:  98 F (36.7 C) 98 F (36.7 C)   TempSrc:      SpO2:  (!) 78% 94% 95%  Weight:      Height:        Intake/Output Summary (Last 24 hours) at 12/29/2021 1015 Last data filed at 12/29/2021 0100 Gross per 24 hour  Intake 320 ml  Output 450 ml  Net -130 ml    Filed Weights   12/26/21 0934  Weight: 122.5 kg    Examination:  General: A/O x4, positive acute respiratory distress Eyes: negative scleral hemorrhage, negative anisocoria, negative icterus ENT: Negative Runny nose, negative gingival bleeding, Neck:  Negative scars, masses, torticollis, lymphadenopathy, JVD Lungs: Clear to auscultation bilaterally, positive diffuse expiratory wheezes  Cardiovascular: Regular rate and rhythm without murmur gallop or rub normal S1 and S2 Abdomen: MORBIDLY OBESE, negative abdominal pain, nondistended, positive soft, bowel sounds, no rebound, no ascites, no appreciable mass Extremities: No significant cyanosis, clubbing, or edema bilateral lower  extremities Skin: Negative rashes, lesions, ulcers Psychiatric:  Negative depression, negative anxiety, negative fatigue, negative mania  Central nervous system:  Cranial nerves II through XII intact, tongue/uvula midline, all extremities muscle strength 5/5, sensation intact throughout, negative dysarthria, negative expressive aphasia, negative receptive aphasia.  Positive Chorea   .     Data Reviewed: Care during the described time interval was provided by me .  I have reviewed this patient's available data, including medical history, events of note, physical examination, and all test results as part of my evaluation.   CBC: Recent Labs  Lab 12/24/21 1447 12/26/21 1046 12/27/21 0035 12/28/21 0449 12/29/21 0513  WBC 5.1 7.9 8.1 12.9* 11.7*  NEUTROABS 3.0 5.6 6.2 11.1* 10.3*  HGB 11.4* 11.4* 10.8* 11.8* 10.9*  HCT 35.8* 34.7* 33.7* 37.0* 34.1*  MCV 107.2* 106.1* 108.7* 108.8* 107.2*  PLT 120* 127* 121* 143* 132*    Basic Metabolic Panel: Recent Labs  Lab 12/24/21 1447 12/26/21 1046 12/27/21 0035 12/28/21 0449 12/29/21 0513  NA 137 136 135 135 137  K 4.3 4.6 4.9 4.6 5.0  CL 104 106 105 106 106  CO2 23 23 20* 23 25  GLUCOSE 146* 148* 125* 208* 242*  BUN 15 20 19  27* 33*  CREATININE 1.18 1.45* 1.22 1.21 1.15  CALCIUM 8.9 8.6* 8.4* 8.6* 8.9  MG  --   --  2.1 2.5* 2.4    GFR: Estimated Creatinine Clearance: 77.4 mL/min (by C-G formula based on SCr of 1.15 mg/dL). Liver Function Tests: Recent Labs  Lab 12/26/21 1046 12/27/21 0035 12/28/21 0449 12/29/21 0513  AST 32 48* 45* 38  ALT 25 25 30  32  ALKPHOS 67 61 63 55  BILITOT 0.7 0.9 0.3 0.5  PROT 7.1 6.9 7.0 6.4*  ALBUMIN 3.8 3.8 3.7 3.3*    No results for input(s): LIPASE, AMYLASE in the last 168 hours. No results for input(s): AMMONIA in the last 168 hours. Coagulation Profile: No results for input(s): INR, PROTIME in the last 168 hours. Cardiac Enzymes: No results for input(s): CKTOTAL, CKMB, CKMBINDEX,  TROPONINI in the last 168 hours. BNP (last 3 results) Recent Labs    11/13/21 1246  PROBNP 73.0  HbA1C: No results for input(s): HGBA1C in the last 72 hours. CBG: No results for input(s): GLUCAP in the last 168 hours. Lipid Profile: No results for input(s): CHOL, HDL, LDLCALC, TRIG, CHOLHDL, LDLDIRECT in the last 72 hours. Thyroid Function Tests: Recent Labs    12/26/21 1505  TSH 1.286    Anemia Panel: Recent Labs    12/26/21 1505 12/27/21 0559 12/28/21 0449 12/29/21 0513  VITAMINB12 414  --  396  --   FOLATE 14.0  --  14.0  --   FERRITIN 922*   < > 839*   808* 691*  TIBC  --   --  208*  --   IRON  --   --  62  --   RETICCTPCT  --   --  1.0  --    < > = values in this interval not displayed.    Urine analysis:    Component Value Date/Time   COLORURINE YELLOW 01/14/2018 [place] 01/14/2018 1153   LABSPEC 1.018 01/14/2018 1153   PHURINE 6.0 01/14/2018 1153   GLUCOSEU NEGATIVE 01/14/2018 1153   HGBUR NEGATIVE 01/14/2018 1153   BILIRUBINUR NEGATIVE 01/14/2018 1153   BILIRUBINUR NEG 06/02/2013 1454   KETONESUR NEGATIVE 01/14/2018 1153   PROTEINUR NEGATIVE 01/14/2018 1153   UROBILINOGEN 0.2 06/02/2013 1454   NITRITE NEGATIVE 01/14/2018 1153   LEUKOCYTESUR NEGATIVE 01/14/2018 1153   Sepsis Labs: @LABRCNTIP (procalcitonin:4,lacticidven:4)  ) Recent Results (from the past 240 hour(s))  Resp Panel by RT-PCR (Flu A&B, Covid) Nasopharyngeal Swab     Status: Abnormal   Collection Time: 12/24/21  5:00 PM   Specimen: Nasopharyngeal Swab; Nasopharyngeal(NP) swabs in vial transport medium  Result Value Ref Range Status   SARS Coronavirus 2 by RT PCR POSITIVE (A) NEGATIVE Final    Comment: (NOTE) SARS-CoV-2 target nucleic acids are DETECTED.  The SARS-CoV-2 RNA is generally detectable in upper respiratory specimens during the acute phase of infection. Positive results are indicative of the presence of the identified virus, but do not rule out  bacterial infection or co-infection with other pathogens not detected by the test. Clinical correlation with patient history and other diagnostic information is necessary to determine patient infection status. The expected result is Negative.  Fact Sheet for Patients: EntrepreneurPulse.com.au  Fact Sheet for Healthcare Providers: IncredibleEmployment.be  This test is not yet approved or cleared by the [place] FDA and  has been authorized for detection and/or diagnosis of SARS-CoV-2 by FDA under an Emergency Use Authorization (EUA).  This EUA will remain in effect (meaning this test can be used) for the duration of  the COVID-19 declaration under Section 564(b)(1) of the A ct, 21 U.S.C. section 360bbb-3(b)(1), unless the authorization is terminated or revoked sooner.     Influenza A by PCR NEGATIVE NEGATIVE Final   Influenza B by PCR NEGATIVE NEGATIVE Final    Comment: (NOTE) The Xpert Xpress SARS-CoV-2/FLU/RSV plus assay is intended as an aid in the diagnosis of influenza from Nasopharyngeal swab specimens and should not be used as a sole basis for treatment. Nasal washings and aspirates are unacceptable for Xpert Xpress SARS-CoV-2/FLU/RSV testing.  Fact Sheet for Patients: EntrepreneurPulse.com.au  Fact Sheet for Healthcare Providers: IncredibleEmployment.be  This test is not yet approved or cleared by the [place] FDA and has been authorized for detection and/or diagnosis of SARS-CoV-2 by FDA under an Emergency Use Authorization (EUA). This EUA will remain in effect (meaning this test can be used) for the duration of the COVID-19 declaration  under Section 564(b)(1) of the Act, 21 U.S.C. section 360bbb-3(b)(1), unless the authorization is terminated or revoked.  Performed at [facility], [place] [address], [place], [place] [address]          Radiology Studies: CT HEAD WO CONTRAST  (5MM)  Result Date: 12/27/2021 CLINICAL DATA:  Neuro deficit, stroke suspected, choreiform movements. Evaluate for old basal ganglia stroke. EXAM: CT HEAD WITHOUT CONTRAST TECHNIQUE: Contiguous axial images were obtained from the base of the skull through the vertex without intravenous contrast. RADIATION DOSE REDUCTION: This exam was performed according to the departmental dose-optimization program which includes automated exposure control, adjustment of the mA and/or kV according to patient size and/or use of iterative reconstruction technique. COMPARISON:  01/01/2018. FINDINGS: Brain: No acute intracranial hemorrhage, midline shift or mass effect. No extra-axial fluid collection. Diffuse atrophy is noted. Subcortical and periventricular white matter hypodensities are present bilaterally. No hydrocephalus. Vascular: Atherosclerotic calcification of the carotid siphons. No hyperdense vessel. Skull: Normal. Negative for fracture or focal lesion. Sinuses/Orbits: Mucosal thickening is present in the ethmoid air cells, maxillary sinuses, and sphenoid sinuses bilaterally and frontal sinus on the left. No acute orbital abnormality. Other: There is opacification of the mastoid air cells and middle ear on the left. IMPRESSION: 1. No acute intracranial process. 2. Atrophy with chronic microvascular ischemic changes. 3. Paranasal sinus disease. 4. Opacification of the mastoid air cells and middle ear on the left, possible mastoiditis versus cholesteatoma. Electronically Signed   By: Brett Fairy M.D.   On: 12/27/2021 22:01   DG Chest Port 1 View  Result Date: 12/28/2021 CLINICAL DATA:  Acute respiratory distress. EXAM: PORTABLE CHEST 1 VIEW COMPARISON:  12/26/2021 FINDINGS: 0453 hours. Low volume film. The cardio pericardial silhouette is enlarged. There is pulmonary vascular congestion without overt pulmonary edema. Airspace disease at the right base is suspicious for pneumonia in there is a tiny right pleural  effusion. Bones are diffusely demineralized. IMPRESSION: Low volume film with right base pneumonia and tiny right pleural effusion. Electronically Signed   By: Misty Stanley M.D.   On: 12/28/2021 17:28        Scheduled Meds:  baclofen  20 mg Oral BID   clonazePAM  0.5 mg Oral BID   folic acid  1 mg Oral Daily   heparin  5,000 Units Subcutaneous Q8H   ipratropium-albuterol  3 mL Nebulization TID   mouth rinse  15 mL Mouth Rinse BID   methylPREDNISolone (SOLU-MEDROL) injection  80 mg Intravenous Daily   metoprolol succinate  25 mg Oral QHS   multivitamin with minerals  1 tablet Oral Daily   sodium chloride flush  3 mL Intravenous Q12H   thiamine  100 mg Oral Daily   Continuous Infusions:  remdesivir 100 mg in NS 100 mL 100 mg (12/28/21 1025)   sodium chloride       LOS: 3 days   The patient is critically ill with multiple organ systems failure and requires high complexity decision making for assessment and support, frequent evaluation and titration of therapies, application of advanced monitoring technologies and extensive interpretation of multiple databases. Critical Care Time devoted to patient care services described in this note  Time spent: 40 minutes     Laylynn Campanella, Geraldo Docker, MD [facility]   If 7PM-7AM, please contact night-coverage 12/29/2021, 10:15 AM

## 2021-12-29 NOTE — Plan of Care (Signed)

## 2021-12-29 NOTE — Progress Notes (Signed)
Pt insists that this nurse did not give him his Klonopin tonight. I have reassured him that it was given before I gave his reg scheduled meds, however he states I didn't. At the time Klonopin was given, pt was having anxiety about needing more air. Oxygen tubing was adjust, cardiac monitor was re-placed and pt was assessed. Pt also given morphine for anxiety of breathing.

## 2021-12-30 DIAGNOSIS — E669 Obesity, unspecified: Secondary | ICD-10-CM | POA: Diagnosis not present

## 2021-12-30 DIAGNOSIS — R0609 Other forms of dyspnea: Secondary | ICD-10-CM

## 2021-12-30 DIAGNOSIS — F411 Generalized anxiety disorder: Secondary | ICD-10-CM | POA: Diagnosis not present

## 2021-12-30 DIAGNOSIS — I2510 Atherosclerotic heart disease of native coronary artery without angina pectoris: Secondary | ICD-10-CM | POA: Diagnosis not present

## 2021-12-30 DIAGNOSIS — U071 COVID-19: Secondary | ICD-10-CM | POA: Diagnosis not present

## 2021-12-30 DIAGNOSIS — N1831 Chronic kidney disease, stage 3a: Secondary | ICD-10-CM | POA: Diagnosis not present

## 2021-12-30 DIAGNOSIS — R0603 Acute respiratory distress: Secondary | ICD-10-CM

## 2021-12-30 DIAGNOSIS — R0602 Shortness of breath: Secondary | ICD-10-CM | POA: Diagnosis present

## 2021-12-30 LAB — GLUCOSE, CAPILLARY
Glucose-Capillary: 281 mg/dL — ABNORMAL HIGH (ref 70–99)
Glucose-Capillary: 357 mg/dL — ABNORMAL HIGH (ref 70–99)
Glucose-Capillary: 369 mg/dL — ABNORMAL HIGH (ref 70–99)
Glucose-Capillary: 393 mg/dL — ABNORMAL HIGH (ref 70–99)

## 2021-12-30 LAB — FERRITIN: Ferritin: 758 ng/mL — ABNORMAL HIGH (ref 24–336)

## 2021-12-30 LAB — COMPREHENSIVE METABOLIC PANEL
ALT: 48 U/L — ABNORMAL HIGH (ref 0–44)
AST: 41 U/L (ref 15–41)
Albumin: 3.4 g/dL — ABNORMAL LOW (ref 3.5–5.0)
Alkaline Phosphatase: 60 U/L (ref 38–126)
Anion gap: 6 (ref 5–15)
BUN: 35 mg/dL — ABNORMAL HIGH (ref 8–23)
CO2: 27 mmol/L (ref 22–32)
Calcium: 9.2 mg/dL (ref 8.9–10.3)
Chloride: 104 mmol/L (ref 98–111)
Creatinine, Ser: 1.17 mg/dL (ref 0.61–1.24)
GFR, Estimated: >60 mL/min (ref >60)
Glucose, Bld: 314 mg/dL — ABNORMAL HIGH (ref 70–99)
Potassium: 4.8 mmol/L (ref 3.5–5.1)
Sodium: 137 mmol/L (ref 135–145)
Total Bilirubin: 0.5 mg/dL (ref 0.3–1.2)
Total Protein: 6.7 g/dL (ref 6.5–8.1)

## 2021-12-30 LAB — CBC WITH DIFFERENTIAL/PLATELET
Abs Immature Granulocytes: 0.15 10*3/uL — ABNORMAL HIGH (ref 0.00–0.07)
Basophils Absolute: 0 10*3/uL (ref 0.0–0.1)
Basophils Relative: 0 %
Eosinophils Absolute: 0 10*3/uL (ref 0.0–0.5)
Eosinophils Relative: 0 %
HCT: 35.1 % — ABNORMAL LOW (ref 39.0–52.0)
Hemoglobin: 11.2 g/dL — ABNORMAL LOW (ref 13.0–17.0)
Immature Granulocytes: 2 %
Lymphocytes Relative: 6 %
Lymphs Abs: 0.5 10*3/uL — ABNORMAL LOW (ref 0.7–4.0)
MCH: 34 pg (ref 26.0–34.0)
MCHC: 31.9 g/dL (ref 30.0–36.0)
MCV: 106.7 fL — ABNORMAL HIGH (ref 80.0–100.0)
Monocytes Absolute: 0.4 10*3/uL (ref 0.1–1.0)
Monocytes Relative: 5 %
Neutro Abs: 7 10*3/uL (ref 1.7–7.7)
Neutrophils Relative %: 87 %
Platelets: 162 10*3/uL (ref 150–400)
RBC: 3.29 MIL/uL — ABNORMAL LOW (ref 4.22–5.81)
RDW: 14.6 % (ref 11.5–15.5)
WBC: 8.1 10*3/uL (ref 4.0–10.5)
nRBC: 0 % (ref 0.0–0.2)

## 2021-12-30 LAB — LACTATE DEHYDROGENASE: LDH: 164 U/L (ref 98–192)

## 2021-12-30 LAB — ANTIPHOSPHOLIPID SYNDROME EVAL, BLD
Anticardiolipin IgA: <9 APL U/mL (ref 0–11)
Anticardiolipin IgG: 17 GPL U/mL — ABNORMAL HIGH (ref 0–14)
Anticardiolipin IgM: 9 MPL U/mL (ref 0–12)
DRVVT: 49.8 s — ABNORMAL HIGH (ref 0.0–47.0)
PTT Lupus Anticoagulant: 49.3 s (ref 0.0–51.9)
Phosphatydalserine, IgA: 2 APS Units (ref 0–19)
Phosphatydalserine, IgG: 9 Units (ref 0–30)
Phosphatydalserine, IgM: <10 Units (ref 0–30)

## 2021-12-30 LAB — DRVVT CONFIRM: dRVVT Confirm: 1.4 ratio — ABNORMAL HIGH (ref 0.8–1.2)

## 2021-12-30 LAB — DRVVT MIX: dRVVT Mix: 42 s — ABNORMAL HIGH (ref 0.0–40.4)

## 2021-12-30 LAB — MAGNESIUM: Magnesium: 2.6 mg/dL — ABNORMAL HIGH (ref 1.7–2.4)

## 2021-12-30 LAB — C-REACTIVE PROTEIN: CRP: 4.1 mg/dL — ABNORMAL HIGH (ref <1.0)

## 2021-12-30 LAB — D-DIMER, QUANTITATIVE: D-Dimer, Quant: 0.79 ug/mL-FEU — ABNORMAL HIGH (ref 0.00–0.50)

## 2021-12-30 LAB — SEDIMENTATION RATE: Sed Rate: 46 mm/hr — ABNORMAL HIGH (ref 0–16)

## 2021-12-30 MED ORDER — IPRATROPIUM-ALBUTEROL 0.5-2.5 (3) MG/3ML IN SOLN
3.0000 mL | Freq: Two times a day (BID) | RESPIRATORY_TRACT | Status: DC
  Administered 2021-12-30 – 2022-01-02 (×7): 3 mL via RESPIRATORY_TRACT
  Filled 2021-12-30 (×7): qty 3

## 2021-12-30 MED ORDER — INSULIN ASPART 100 UNIT/ML IJ SOLN
0.0000 [IU] | INTRAMUSCULAR | Status: DC
  Administered 2021-12-30: 8 [IU] via SUBCUTANEOUS
  Administered 2021-12-30 (×2): 15 [IU] via SUBCUTANEOUS
  Administered 2021-12-31: 3 [IU] via SUBCUTANEOUS
  Administered 2021-12-31: 15 [IU] via SUBCUTANEOUS
  Administered 2021-12-31: 8 [IU] via SUBCUTANEOUS

## 2021-12-30 MED ORDER — IPRATROPIUM-ALBUTEROL 20-100 MCG/ACT IN AERS
1.0000 | INHALATION_SPRAY | Freq: Four times a day (QID) | RESPIRATORY_TRACT | Status: DC | PRN
  Administered 2021-12-31: 1 via RESPIRATORY_TRACT
  Filled 2021-12-30: qty 4

## 2021-12-30 NOTE — Plan of Care (Signed)
No acute events overnight. Oxygen titrated to 2L with sats at 95%.   Problem: Education: Goal: Knowledge of General Education information will improve Description: Including pain rating scale, medication(s)/side effects and non-pharmacologic comfort measures Outcome: Progressing   Problem: Health Behavior/Discharge Planning: Goal: Ability to manage health-related needs will improve Outcome: Progressing   Problem: Clinical Measurements: Goal: Ability to maintain clinical measurements within normal limits will improve Outcome: Progressing Goal: Will remain free from infection Outcome: Progressing Goal: Diagnostic test results will improve Outcome: Progressing Goal: Respiratory complications will improve Outcome: Progressing Goal: Cardiovascular complication will be avoided Outcome: Progressing   Problem: Activity: Goal: Risk for activity intolerance will decrease Outcome: Progressing   Problem: Nutrition: Goal: Adequate nutrition will be maintained Outcome: Progressing   Problem: Coping: Goal: Level of anxiety will decrease Outcome: Progressing   Problem: Elimination: Goal: Will not experience complications related to bowel motility Outcome: Progressing Goal: Will not experience complications related to urinary retention Outcome: Progressing   Problem: Pain Managment: Goal: General experience of comfort will improve Outcome: Progressing   Problem: Safety: Goal: Ability to remain free from injury will improve Outcome: Progressing   Problem: Skin Integrity: Goal: Risk for impaired skin integrity will decrease Outcome: Progressing

## 2021-12-30 NOTE — Progress Notes (Signed)
PROGRESS NOTE    Christian [name]  ASN:[id number] DOB: 06-24-1946 DOA: 12/26/2021 PCP: [name], MD   Brief Narrative:  Christian [name] is a 76 yo WM PMHx NSCLC s/p RML/RLL bilobectomies with LN dissection and s/p SBRT to L&R adrenal glands (July 2020 and Feb 20222 respectively).   Presented to the ER with worsening shortness of breath at home.  He has also been undergoing outpatient referral to pulmonology to see about possibly being started on home oxygen however has not had any documented hypoxia. He was last seen by oncology in October 2022. He was recently evaluated in the ER on 12/24/2021 with similar symptoms and his COVID testing from that time was positive. He was noted to be tachypneic on evaluation but no hypoxia.  He was however placed on oxygen for comfort in the ER. In addition he was noted to have a large amounts of uncontrolled irregular movements during evaluation.  His daughter noted that these have been present for approximately 1 to 2 years however have worsened once his COVID symptoms started.  They have not yet sought any further evaluation for this.   I have personally briefly reviewed patient's old medical records in [facility] and discussed patient with the ER provider when appropriate/indicated   Subjective: 1/22 A/O x4, continued SOB but improved. Chorea improved.     Assessment & Plan:  Covid vaccination; vaccinated 3/4  Principal Problem:   COVID-19 virus infection Active Problems:   Anxiety state   Essential hypertension, benign   CAD (coronary artery disease)   OSA on CPAP   Chorea   Chronic kidney disease, stage 3a (HCC)   Elevated troponin   Macrocytic anemia   Obesity, Class II, BMI 35-39.9   Pneumonia due to COVID-19 virus   Demand ischemia (HCC)  COVID-19 virus infection/COVID pneumonia- (present on admission) - at risk for progression given lung malignancy history and co-morbidities -1/18 Remdesivir per pharmacy protocol - 1/19  overnight patient became hypoxic now on O2.  We will start steroids. - 1/19 Solu-Medrol 80 mg daily - DuoNeb QID - Incentive spirometry - Flutter valve -Vitamins per COVID protocol (folic acid, thiamine, multivitamin.) - Continue to trend inflammatory markers -Will want to maintain patient euvolemic to slightly dry -1/20 clonazepam 0.5 mg BID for anxiety - 1/20 Morphine 1 mg PRN feeling of SOB  COVID-19 Labs  Recent Labs    12/28/21 0449 12/29/21 0513 12/30/21 0523  DDIMER 0.93* 0.66* 0.79*  FERRITIN 839*   808* 691* 758*  LDH 178 153 164  CRP 12.2* 7.2*  --      Lab Results  Component Value Date   SARSCOV2NAA POSITIVE (A) 12/24/2021   [place] NEGATIVE 07/11/2021   [place] NEGATIVE 06/07/2019     Hx Metastatic non-small cell lung cancer, adenocarcinoma initially diagnosed as stage IB (T2a, N0, M0) RIGHT lower lobe lung mass.   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 -January 16, 2018 S/p RIGHT middle and lower bilobectomies with lymph node Dr. Roxan Hockey cardiothoracic surgery   -July 2020 s/p SBRT to the metastatic lesion in the LEFT adrenal gland by Dr. Sondra Come radiation oncology. -February 2022 SBRT RIGHT adrenal gland mass by Dr. Sondra Come . -1/21 notified Dr. Lorna Few oncology of admission via epic.  Patient has been complaining for the last 2 years of increasing respiratory distress. -1/22 discussed case with Dr. Christinia Gully, PCCM concerning what appears to be patient's deteriorating respiratory status.  Will consult.  Will await recommendations  OSA on CPAP - nightly CPAP if used at home -1/21 trazodone 50 mg   Chorea - Involuntary, rapid, and variable movements appreciated during exam in the ER on admission; these appear concerning for Chorea-like movements and are worsening per daughter after development of COVID symptoms.  He has not had any prior work-up at this time.  Symptoms have been ongoing for approximately 1 to 2 years  per daughter. -Per patient and son worse when patient has acute illness.    Chronic kidney disease, stage 3a (HCC) (baseline  CR ~ 1.2 - 1.3) Lab Results  Component Value Date   CREATININE 1.17 12/30/2021   CREATININE 1.15 12/29/2021   CREATININE 1.21 12/28/2021   CREATININE 1.22 12/27/2021   CREATININE 1.45 (H) 12/26/2021  -Strict in and out -1106ml - Daily weight Filed Weights   12/26/21 0934  Weight: 122.5 kg  -Back to baseline   Elevated troponin/Demand ischemia - suspect some demand ischemia related; also renal function slightly worse - trend trop  Latest Reference Range & Units 12/26/21 10:46 12/26/21 12:26 12/26/21 21:07 12/27/21 00:35 12/27/21 05:59  Troponin I (High Sensitivity) <18 ng/L 79 (H) 77 (H) 71 (H) 81 (H) 80 (H)  (H): Data is abnormally high - EKG reviewed no ischemia changes and patient CP free   Mixed Anemia - Baseline hemoglobin 11 to 12 g/dL - Currently at baseline - MCV 106 - 1/19 Anemia Panel; most consistent with anemia of chronic disease.  Given elevated MCV most likely a mixed anemia    Essential HTN- (present on admission) - Holding lisinopril in setting of renal function - Use labetalol or hydralazine as needed -1/20 decrease Toprol 25 mg daily   CAD (coronary artery disease)- (present on admission) -See essential HTN   Anxiety state- (present on admission) -1/20 clonazepam 0.5 mg BID   Obesity class II (BMI 36.62 kg/m) - Once patient's current acute issues resolved counseled to address with PCP  DM type II with hyperglycemia - 11/13/2021 hemoglobin A1c= 9.7 - 1/22 moderate SSI -1/22 DM coordinator consult uncontrolled diabetic with high risk factors for acute stroke/MI please council  -1/22 DM nutrition:uncontrolled diabetic with high risk factors for acute stroke/MI please council   DVT prophylaxis: Subcu heparin Code Status: Full Family Communication: 1/22 daughter at bedside for discussion of plan of care all questions  answered Status is: Inpatient    Dispo: The patient is from: Home              Anticipated d/c is to: Home              Anticipated d/c date is: > 3 days              Patient currently is not medically stable to d/c.      Consultants:  Neurology Dr. Christinia Gully, PCCM Dr. Lorna Few oncology  Procedures/Significant Events:     I have personally reviewed and interpreted all radiology studies and my findings are as above.  VENTILATOR SETTINGS: Nasal cannula 1/20 Flow 3 L/min SPO2 98%   Cultures    Antimicrobials: Anti-infectives (From admission, onward)    Start     Ordered Stop   12/27/21 1000  remdesivir 100 mg in sodium chloride 0.9 % 100 mL IVPB       See Hyperspace for full Linked Orders Report.   12/26/21 1254 12/31/21 0959   12/26/21 1400  remdesivir 200 mg in sodium chloride 0.9% 250 mL IVPB       See Hyperspace  for full Linked Orders Report.   12/26/21 1254 12/26/21 1600         Devices    LINES / TUBES:      Continuous Infusions:  sodium chloride       Objective: Vitals:   12/30/21 0519 12/30/21 0523 12/30/21 0807 12/30/21 0809  BP: (!) 162/72 135/73    Pulse: 72     Resp: 20     Temp: 97.8 F (36.6 C)     TempSrc: Oral     SpO2: 97%  97% 97%  Weight:      Height:        Intake/Output Summary (Last 24 hours) at 12/30/2021 1058 Last data filed at 12/30/2021 0000 Gross per 24 hour  Intake --  Output 300 ml  Net -300 ml    Filed Weights   12/26/21 0934  Weight: 122.5 kg    Examination:  General: A/O x4, positive acute respiratory distress Eyes: negative scleral hemorrhage, negative anisocoria, negative icterus ENT: Negative Runny nose, negative gingival bleeding, Neck:  Negative scars, masses, torticollis, lymphadenopathy, JVD Lungs: Clear to auscultation bilaterally, positive diffuse expiratory wheezes (improved from 1/21) Cardiovascular: Regular rate and rhythm without murmur gallop or rub normal S1 and  S2 Abdomen: MORBIDLY OBESE, negative abdominal pain, nondistended, positive soft, bowel sounds, no rebound, no ascites, no appreciable mass Extremities: No significant cyanosis, clubbing, or edema bilateral lower extremities Skin: Negative rashes, lesions, ulcers Psychiatric:  Negative depression, negative anxiety, negative fatigue, negative mania  Central nervous system:  Cranial nerves II through XII intact, tongue/uvula midline, all extremities muscle strength 5/5, sensation intact throughout, negative dysarthria, negative expressive aphasia, negative receptive aphasia.  Minimal chorea   .     Data Reviewed: Care during the described time interval was provided by me .  I have reviewed this patient's available data, including medical history, events of note, physical examination, and all test results as part of my evaluation.   CBC: Recent Labs  Lab 12/26/21 1046 12/27/21 0035 12/28/21 0449 12/29/21 0513 12/30/21 0523  WBC 7.9 8.1 12.9* 11.7* 8.1  NEUTROABS 5.6 6.2 11.1* 10.3* 7.0  HGB 11.4* 10.8* 11.8* 10.9* 11.2*  HCT 34.7* 33.7* 37.0* 34.1* 35.1*  MCV 106.1* 108.7* 108.8* 107.2* 106.7*  PLT 127* 121* 143* 132* 188    Basic Metabolic Panel: Recent Labs  Lab 12/26/21 1046 12/27/21 0035 12/28/21 0449 12/29/21 0513 12/30/21 0523  NA 136 135 135 137 137  K 4.6 4.9 4.6 5.0 4.8  CL 106 105 106 106 104  CO2 23 20* 23 25 27   GLUCOSE 148* 125* 208* 242* 314*  BUN 20 19 27* 33* 35*  CREATININE 1.45* 1.22 1.21 1.15 1.17  CALCIUM 8.6* 8.4* 8.6* 8.9 9.2  MG  --  2.1 2.5* 2.4 2.6*    GFR: Estimated Creatinine Clearance: 76 mL/min (by C-G formula based on SCr of 1.17 mg/dL). Liver Function Tests: Recent Labs  Lab 12/26/21 1046 12/27/21 0035 12/28/21 0449 12/29/21 0513 12/30/21 0523  AST 32 48* 45* 38 41  ALT 25 25 30  32 48*  ALKPHOS 67 61 63 55 60  BILITOT 0.7 0.9 0.3 0.5 0.5  PROT 7.1 6.9 7.0 6.4* 6.7  ALBUMIN 3.8 3.8 3.7 3.3* 3.4*    No results for input(s):  LIPASE, AMYLASE in the last 168 hours. No results for input(s): AMMONIA in the last 168 hours. Coagulation Profile: No results for input(s): INR, PROTIME in the last 168 hours. Cardiac Enzymes: No results for input(s): CKTOTAL, CKMB,  CKMBINDEX, TROPONINI in the last 168 hours. BNP (last 3 results) Recent Labs    11/13/21 1246  PROBNP 73.0    HbA1C: No results for input(s): HGBA1C in the last 72 hours. CBG: No results for input(s): GLUCAP in the last 168 hours. Lipid Profile: No results for input(s): CHOL, HDL, LDLCALC, TRIG, CHOLHDL, LDLDIRECT in the last 72 hours. Thyroid Function Tests: No results for input(s): TSH, T4TOTAL, FREET4, T3FREE, THYROIDAB in the last 72 hours.  Anemia Panel: Recent Labs    12/28/21 0449 12/29/21 0513 12/30/21 0523  VITAMINB12 396  --   --   FOLATE 14.0  --   --   FERRITIN 839*   808* 691* 758*  TIBC 208*  --   --   IRON 62  --   --   RETICCTPCT 1.0  --   --     Urine analysis:    Component Value Date/Time   COLORURINE YELLOW 01/14/2018 1153   APPEARANCEUR CLEAR 01/14/2018 1153   LABSPEC 1.018 01/14/2018 1153   PHURINE 6.0 01/14/2018 1153   GLUCOSEU NEGATIVE 01/14/2018 1153   HGBUR NEGATIVE 01/14/2018 1153   BILIRUBINUR NEGATIVE 01/14/2018 1153   BILIRUBINUR NEG 06/02/2013 1454   KETONESUR NEGATIVE 01/14/2018 1153   PROTEINUR NEGATIVE 01/14/2018 1153   UROBILINOGEN 0.2 06/02/2013 1454   NITRITE NEGATIVE 01/14/2018 1153   LEUKOCYTESUR NEGATIVE 01/14/2018 1153   Sepsis Labs: @LABRCNTIP (procalcitonin:4,lacticidven:4)  ) Recent Results (from the past 240 hour(s))  Resp Panel by RT-PCR (Flu A&B, Covid) Nasopharyngeal Swab     Status: Abnormal   Collection Time: 12/24/21  5:00 PM   Specimen: Nasopharyngeal Swab; Nasopharyngeal(NP) swabs in vial transport medium  Result Value Ref Range Status   SARS Coronavirus 2 by RT PCR POSITIVE (A) NEGATIVE Final    Comment: (NOTE) SARS-CoV-2 target nucleic acids are DETECTED.  The  SARS-CoV-2 RNA is generally detectable in upper respiratory specimens during the acute phase of infection. Positive results are indicative of the presence of the identified virus, but do not rule out bacterial infection or co-infection with other pathogens not detected by the test. Clinical correlation with patient history and other diagnostic information is necessary to determine patient infection status. The expected result is Negative.  Fact Sheet for Patients: EntrepreneurPulse.com.au  Fact Sheet for Healthcare Providers: IncredibleEmployment.be  This test is not yet approved or cleared by the [place] FDA and  has been authorized for detection and/or diagnosis of SARS-CoV-2 by FDA under an Emergency Use Authorization (EUA).  This EUA will remain in effect (meaning this test can be used) for the duration of  the COVID-19 declaration under Section 564(b)(1) of the A ct, 21 U.S.C. section 360bbb-3(b)(1), unless the authorization is terminated or revoked sooner.     Influenza A by PCR NEGATIVE NEGATIVE Final   Influenza B by PCR NEGATIVE NEGATIVE Final    Comment: (NOTE) The Xpert Xpress SARS-CoV-2/FLU/RSV plus assay is intended as an aid in the diagnosis of influenza from Nasopharyngeal swab specimens and should not be used as a sole basis for treatment. Nasal washings and aspirates are unacceptable for Xpert Xpress SARS-CoV-2/FLU/RSV testing.  Fact Sheet for Patients: EntrepreneurPulse.com.au  Fact Sheet for Healthcare Providers: IncredibleEmployment.be  This test is not yet approved or cleared by the [place] FDA and has been authorized for detection and/or diagnosis of SARS-CoV-2 by FDA under an Emergency Use Authorization (EUA). This EUA will remain in effect (meaning this test can be used) for the duration of the COVID-19 declaration under Section  564(b)(1) of the Act, 21 U.S.C. section  360bbb-3(b)(1), unless the authorization is terminated or revoked.  Performed at [facility], [place] [address], [place], [place] [address]          Radiology Studies: DG Chest Port 1 View  Result Date: 12/28/2021 CLINICAL DATA:  Acute respiratory distress. EXAM: PORTABLE CHEST 1 VIEW COMPARISON:  12/26/2021 FINDINGS: 0453 hours. Low volume film. The cardio pericardial silhouette is enlarged. There is pulmonary vascular congestion without overt pulmonary edema. Airspace disease at the right base is suspicious for pneumonia in there is a tiny right pleural effusion. Bones are diffusely demineralized. IMPRESSION: Low volume film with right base pneumonia and tiny right pleural effusion. Electronically Signed   By: Misty Stanley M.D.   On: 12/28/2021 17:28        Scheduled Meds:  baclofen  20 mg Oral BID   clonazePAM  0.5 mg Oral BID   folic acid  1 mg Oral Daily   heparin  5,000 Units Subcutaneous Q8H   ipratropium-albuterol  3 mL Nebulization BID   mouth rinse  15 mL Mouth Rinse BID   methylPREDNISolone (SOLU-MEDROL) injection  80 mg Intravenous Daily   metoprolol succinate  25 mg Oral QHS   multivitamin with minerals  1 tablet Oral Daily   sodium chloride flush  3 mL Intravenous Q12H   thiamine  100 mg Oral Daily   traZODone  50 mg Oral QHS   Continuous Infusions:  sodium chloride       LOS: 4 days   The patient is critically ill with multiple organ systems failure and requires high complexity decision making for assessment and support, frequent evaluation and titration of therapies, application of advanced monitoring technologies and extensive interpretation of multiple databases. Critical Care Time devoted to patient care services described in this note  Time spent: 40 minutes     Arvetta Araque, Geraldo Docker, MD [facility]   If 7PM-7AM, please contact night-coverage 12/30/2021, 10:58 AM

## 2021-12-30 NOTE — Consult Note (Signed)
NAME:  Christian [name], MRN:  [id number], DOB:  1946/03/28, LOS: 4 ADMISSION DATE:  12/26/2021, CONSULTATION DATE:  12/30/21 REFERRING MD:  Chesley Noon, CHIEF COMPLAINT:  sob/covid pos   History of Present Illness:   76 yo male quit smoking 3 y PTA maint on ACEi prior to admit with unexplained doe s/p   s/p RML/RLL bilobectomies with LN dissection and s/p SBRT to L&R adrenal glands (July 2020 and Feb 20222 respectively) for [facility] lung ca and  presented to the ER with worsening shortness of breath at home.  He has also been had referral to pulmonology to see about possibly being started on home oxygen however has not had any documented hypoxia and had not been seen by pulmonary prior to admit with last  PFTs 12/19/17: FVC 3.60 (75%), FEV1 2.80 (79%), DLCO unc 24.35 (69%).  He was last seen by oncology in October 2022.  He was  evaluated in the ER on 12/24/2021 with similar symptoms and his COVID testing from that time was positive. He was noted to be tachypneic on evaluation but no hypoxia.  He was however placed on oxygen for comfort in the ER. In addition he was noted to have a large amounts of uncontrolled irregular movements during evaluation.  His daughter noted that these have been present for approximately 1 to 2 years however have worsened once his COVID symptoms started.  They have not yet sought any further evaluation for this.   Baseline says was able to walk 100 y to MB but last did so 6-8 m ago.  Mild dysphagia, mild sensation of pnds but no real cough.  Does have orthopnea but also back has hurt lying flat about the same period of time.    Significant Hospital Events: Including procedures, antibiotic start and stop dates in addition to other pertinent events   Sniff >>>    Scheduled Meds:  baclofen  20 mg Oral BID   clonazePAM  0.5 mg Oral BID   folic acid  1 mg Oral Daily   heparin  5,000 Units Subcutaneous Q8H   insulin aspart  0-15 Units Subcutaneous Q4H   ipratropium-albuterol   3 mL Nebulization BID   mouth rinse  15 mL Mouth Rinse BID   methylPREDNISolone (SOLU-MEDROL) injection  80 mg Intravenous Daily   metoprolol succinate  25 mg Oral QHS   multivitamin with minerals  1 tablet Oral Daily   sodium chloride flush  3 mL Intravenous Q12H   thiamine  100 mg Oral Daily   traZODone  50 mg Oral QHS   Continuous Infusions:  sodium chloride     PRN Meds:.acetaminophen, albuterol, chlorpheniramine-HYDROcodone, guaiFENesin-dextromethorphan, hydrALAZINE, Ipratropium-Albuterol, morphine injection, sodium chloride    Interim History / Subjective:  Comfortable at 45 degrees/ better p neb   Objective   Blood pressure 135/73, pulse 72, temperature 97.8 F (36.6 C), temperature source Oral, resp. rate 20, height 6' (1.829 m), weight 122.5 kg, SpO2 97 %.        Intake/Output Summary (Last 24 hours) at 12/30/2021 1810 Last data filed at 12/30/2021 0000 Gross per 24 hour  Intake --  Output 300 ml  Net -300 ml   Filed Weights   12/26/21 0934  Weight: 122.5 kg    Examination: Tmax 98.6 on day 5 of remdesovir rx  General appearance:    chronically ill obese wm constant head movement   At Rest 02 sats  97% on 2lpm  No jvd Oropharynx clear,  mucosa nl Neck  supple Lungs with a few scattered exp > insp rhonchi bilaterally RRR no s3 or or sign murmur Abd obese with NO insp  excursion at 30 degrees and can't lie flat due to sob  Extr warm with no edema or clubbing noted Neuro  Sensorium intact ,  no apparent motor deficits     I personally reviewed images and agree with radiology impression as follows:  CXR:   portable 12/28/21  Low volume film with right base pneumonia and tiny right pleural effusion. 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  By themselves they don't actually cause a problem, much like oxygen can't by itself start a fire, but they certainly serve as a powerful catalyst or enhancer for any "fire"  or inflammatory process in the upper airway, be it caused by an ET  tube or more commonly reflux (especially in the obese or pts with known GERD or who are on biphoshonates).    In the era of ARB near equivalency until we have a better handle on the reversibility of the airway problem, it just makes sense to avoid ACEI  entirely in the short run and then decide later, having established a level of airway control using a reasonable limited regimen, whether to add back ace but even then being very careful to observe the pt for worsening airway control and number of meds used/ needed to control symptoms.   >>> avoid acei - also avoid high dose BB same reasona (ok to use bisoprolol if higher doses of BB needed       Labs   CBC: Recent Labs  Lab 12/26/21 1046 12/27/21 0035 12/28/21 0449 12/29/21 0513 12/30/21 0523  WBC 7.9 8.1 12.9* 11.7* 8.1  NEUTROABS 5.6 6.2 11.1* 10.3* 7.0  HGB 11.4* 10.8* 11.8* 10.9* 11.2*  HCT 34.7* 33.7* 37.0* 34.1* 35.1*  MCV 106.1* 108.7* 108.8* 107.2* 106.7*  PLT 127* 121* 143* 132* 734    Basic Metabolic Panel: Recent Labs  Lab 12/26/21 1046 12/27/21 0035 12/28/21 0449 12/29/21 0513 12/30/21 0523  NA 136 135 135 137 137  K 4.6 4.9 4.6 5.0 4.8  CL 106 105 106 106 104  CO2 23 20* 23 25 27    GLUCOSE 148* 125* 208* 242* 314*  BUN 20 19 27* 33* 35*  CREATININE 1.45* 1.22 1.21 1.15 1.17  CALCIUM 8.6* 8.4* 8.6* 8.9 9.2  MG  --  2.1 2.5* 2.4 2.6*   GFR: Estimated Creatinine Clearance: 76 mL/min (by C-G formula based on SCr of 1.17 mg/dL). Recent Labs  Lab 12/26/21 1505 12/27/21 0035 12/28/21 0449 12/29/21 0513 12/30/21 0523  PROCALCITON <0.10  --   --   --   --   WBC  --  8.1 12.9* 11.7*  8.1    Liver Function Tests: Recent Labs  Lab 12/26/21 1046 12/27/21 0035 12/28/21 0449 12/29/21 0513 12/30/21 0523  AST 32 48* 45* 38 41  ALT 25 25 30  32 48*  ALKPHOS 67 61 63 55 60  BILITOT 0.7 0.9 0.3 0.5 0.5  PROT 7.1 6.9 7.0 6.4* 6.7  ALBUMIN 3.8 3.8 3.7 3.3* 3.4*   No results for input(s): LIPASE, AMYLASE in the last 168 hours. No results for input(s): AMMONIA in the last 168 hours.  ABG    Component Value Date/Time   PHART 7.355 12/28/2021 1706   PCO2ART 40.6 12/28/2021 1706   PO2ART 136 (H) 12/28/2021 1706   HCO3 22.1 12/28/2021 1706   TCO2 25 01/16/2018 1411   ACIDBASEDEF 2.6 (H) 12/28/2021 1706   O2SAT 98.8 12/28/2021 1706     Coagulation Profile: No results for input(s): INR, PROTIME in the last 168 hours.  Cardiac Enzymes: No results for input(s): CKTOTAL, CKMB, CKMBINDEX, TROPONINI in the last 168 hours.  HbA1C: Hgb A1c MFr Bld  Date/Time Value Ref Range Status  11/13/2021 12:46 PM 9.7 (H) 4.6 - 6.5 % Final    Comment:    Glycemic Control Guidelines for People with Diabetes:Non Diabetic:  <6%Goal of Therapy: <7%Additional Action Suggested:  >8%   10/18/2020 09:13 AM 8.7 (H) 4.6 - 6.5 % Final    Comment:    Glycemic Control Guidelines for People with Diabetes:Non Diabetic:  <6%Goal of Therapy: <7%Additional Action Suggested:  >8%     CBG: Recent Labs  Lab 12/30/21 1148 12/30/21 1617  GLUCAP 281* 369*       Past Medical History:  He,  has a past medical history of Anxiety, Arthritis, Cancer ([place]), Coronary artery disease, Diabetes  mellitus type 2 with complications ([place]), Dyspnea, GERD (gastroesophageal reflux disease), History of radiation therapy, History of radiation therapy, History of radiation therapy, History of shingles, Hyperlipidemia, Hypertension, lung ca (dx'd 01/2018), 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   Surgical History:   Past Surgical History:  Procedure Laterality Date   CARDIAC CATHETERIZATION     2010 @ [facility] (per pt)   COLONOSCOPY W/ POLYPECTOMY     EYE SURGERY Bilateral    cataracts   OPEN REDUCTION INTERNAL FIXATION (ORIF) DISTAL RADIAL FRACTURE Right 11/26/2013   Procedure: OPEN REDUCTION INTERNAL FIXATION (ORIF) DISTAL RADIAL FRACTURE;  Surgeon: Newt Minion, MD;  Location: [place];  Service: Orthopedics;  Laterality: Right;  Open Reduction Internal Fixation Right Distal Radius    VIDEO ASSISTED THORACOSCOPY (VATS)/ LOBECTOMY Right 01/16/2018   Procedure: VIDEO ASSISTED THORACOSCOPY (VATS)/RIGHT MIDDLE AND LOWER LOBE LUNG LOBECTOMY WITH NODE BIOPSIES x6;  Surgeon: Melrose Nakayama, MD;  Location: [place];  Service: Thoracic;  Laterality: Right;   VIDEO BRONCHOSCOPY WITH ENDOBRONCHIAL NAVIGATION Right 12/15/2017   Procedure: VIDEO BRONCHOSCOPY WITH ENDOBRONCHIAL NAVIGATION;  Surgeon: Melrose Nakayama, MD;  Location: [place];  Service: Thoracic;  Laterality: Right;   VIDEO BRONCHOSCOPY WITH ENDOBRONCHIAL ULTRASOUND N/A 12/15/2017   Procedure: VIDEO BRONCHOSCOPY WITH ENDOBRONCHIAL ULTRASOUND;  Surgeon: Melrose Nakayama, MD;  Location: [place];  Service: Thoracic;  Laterality: N/A;     Social History:   reports that he quit smoking about 3 years ago. His smoking use included cigarettes and e-cigarettes. He has a 51.00 pack-year smoking history. He has never used smokeless tobacco. He reports current alcohol use of about 7.0 standard drinks per week. He reports that he does not use drugs.  Family History:  His family history includes  Cancer in his sister; Colon cancer in his father; Diabetes in his brother; Heart disease in his brother, father, mother, and sister; Kidney disease in his brother; Prostate cancer in his father.   Allergies Allergies  Allergen Reactions   Celebrex [Celecoxib] Itching     Home Medications  Prior to Admission medications   Medication Sig Start Date End Date Taking? Authorizing Provider  albuterol (VENTOLIN HFA) 108 (90 Base) MCG/ACT inhaler Inhale 1-2 puffs into the lungs every 6 (six) hours as needed for wheezing or shortness of breath. 07/11/21  Yes [name], MD  aspirin 325 MG tablet Take 162.5 mg by mouth daily.   Yes [provider]  baclofen (LIORESAL) 20 MG tablet TAKE 1 TABLET (20 MG TOTAL) BY MOUTH 2 (TWO) TIMES DAILY AS NEEDED FOR MUSCLE SPASMS. Patient taking differently: Take 40 mg by mouth at bedtime. 10/30/21  Yes [name], MD  clonazePAM (KLONOPIN) 0.5 MG tablet Take 1 tablet (0.5 mg total) by mouth at bedtime as needed. For insomnia 11/13/21  Yes [name], MD  diphenhydrAMINE (BENADRYL) 25 mg capsule Take 75 mg by mouth at bedtime.   Yes [provider]  lisinopril (ZESTRIL) 10 MG tablet Take 1 tablet (10 mg total) by mouth daily. 11/13/21  Yes [name], MD  Melatonin 10 MG TABS Take 10 mg by mouth at bedtime.    Yes [provider]  metFORMIN (GLUCOPHAGE) 500 MG tablet TAKE 1 TABLET BY MOUTH EVERY DAY WITH BREAKFAST Patient taking differently: Take 500 mg by mouth 2 (two) times daily with a meal. 12/17/21  Yes [name], MD  metoprolol succinate (TOPROL-XL) 50 MG 24 hr tablet Take 1 tablet (50 mg total) by mouth daily. Take with or immediately following a meal. Patient taking differently: Take 50 mg by mouth at bedtime. Take with or immediately following a meal. 11/13/21  Yes [name], MD  nitroGLYCERIN (NITROSTAT) 0.4 MG SL tablet Place 1 tablet (0.4 mg total) under the tongue every 5 (five) minutes as needed  for chest pain. 05/07/16  Yes Arbutus Leas, NP  simvastatin (ZOCOR) 40 MG tablet Take 1 tablet (40 mg total) by mouth at bedtime. 11/13/21  Yes [name], MD  vitamin E 100 UNIT capsule Take 100 Units by mouth daily.   Yes [provider]      Christinia Gully, MD Pulmonary and [place] [phone]   After 7:00 pm call Elink  [phone]

## 2021-12-31 ENCOUNTER — Inpatient Hospital Stay (HOSPITAL_COMMUNITY): Payer: PPO

## 2021-12-31 DIAGNOSIS — U071 COVID-19: Secondary | ICD-10-CM

## 2021-12-31 LAB — COMPREHENSIVE METABOLIC PANEL
ALT: 70 U/L — ABNORMAL HIGH (ref 0–44)
AST: 48 U/L — ABNORMAL HIGH (ref 15–41)
Albumin: 3.4 g/dL — ABNORMAL LOW (ref 3.5–5.0)
Alkaline Phosphatase: 65 U/L (ref 38–126)
Anion gap: 4 — ABNORMAL LOW (ref 5–15)
BUN: 34 mg/dL — ABNORMAL HIGH (ref 8–23)
CO2: 30 mmol/L (ref 22–32)
Calcium: 9.1 mg/dL (ref 8.9–10.3)
Chloride: 103 mmol/L (ref 98–111)
Creatinine, Ser: 1.18 mg/dL (ref 0.61–1.24)
GFR, Estimated: >60 mL/min (ref >60)
Glucose, Bld: 227 mg/dL — ABNORMAL HIGH (ref 70–99)
Potassium: 4.5 mmol/L (ref 3.5–5.1)
Sodium: 137 mmol/L (ref 135–145)
Total Bilirubin: 0.5 mg/dL (ref 0.3–1.2)
Total Protein: 6.6 g/dL (ref 6.5–8.1)

## 2021-12-31 LAB — C-REACTIVE PROTEIN: CRP: 2.5 mg/dL — ABNORMAL HIGH (ref <1.0)

## 2021-12-31 LAB — CBC WITH DIFFERENTIAL/PLATELET
Abs Immature Granulocytes: 0.54 10*3/uL — ABNORMAL HIGH (ref 0.00–0.07)
Basophils Absolute: 0 10*3/uL (ref 0.0–0.1)
Basophils Relative: 0 %
Eosinophils Absolute: 0 10*3/uL (ref 0.0–0.5)
Eosinophils Relative: 0 %
HCT: 36.6 % — ABNORMAL LOW (ref 39.0–52.0)
Hemoglobin: 11.9 g/dL — ABNORMAL LOW (ref 13.0–17.0)
Immature Granulocytes: 5 %
Lymphocytes Relative: 5 %
Lymphs Abs: 0.6 10*3/uL — ABNORMAL LOW (ref 0.7–4.0)
MCH: 34.8 pg — ABNORMAL HIGH (ref 26.0–34.0)
MCHC: 32.5 g/dL (ref 30.0–36.0)
MCV: 107 fL — ABNORMAL HIGH (ref 80.0–100.0)
Monocytes Absolute: 1 10*3/uL (ref 0.1–1.0)
Monocytes Relative: 9 %
Neutro Abs: 9.5 10*3/uL — ABNORMAL HIGH (ref 1.7–7.7)
Neutrophils Relative %: 81 %
Platelets: 191 10*3/uL (ref 150–400)
RBC: 3.42 MIL/uL — ABNORMAL LOW (ref 4.22–5.81)
RDW: 14.5 % (ref 11.5–15.5)
WBC: 11.7 10*3/uL — ABNORMAL HIGH (ref 4.0–10.5)
nRBC: 0.3 % — ABNORMAL HIGH (ref 0.0–0.2)

## 2021-12-31 LAB — HEAVY METALS, BLOOD
Arsenic: 2 ug/L (ref 0–9)
Lead: <1 ug/dL (ref 0.0–3.4)
Mercury: 1.3 ug/L (ref 0.0–14.9)

## 2021-12-31 LAB — LACTATE DEHYDROGENASE: LDH: 187 U/L (ref 98–192)

## 2021-12-31 LAB — GLUCOSE, CAPILLARY
Glucose-Capillary: 277 mg/dL — ABNORMAL HIGH (ref 70–99)
Glucose-Capillary: 167 mg/dL — ABNORMAL HIGH (ref 70–99)
Glucose-Capillary: 221 mg/dL — ABNORMAL HIGH (ref 70–99)
Glucose-Capillary: 383 mg/dL — ABNORMAL HIGH (ref 70–99)
Glucose-Capillary: 336 mg/dL — ABNORMAL HIGH (ref 70–99)

## 2021-12-31 LAB — MAGNESIUM: Magnesium: 2.5 mg/dL — ABNORMAL HIGH (ref 1.7–2.4)

## 2021-12-31 LAB — FERRITIN: Ferritin: 822 ng/mL — ABNORMAL HIGH (ref 24–336)

## 2021-12-31 LAB — D-DIMER, QUANTITATIVE: D-Dimer, Quant: 0.82 ug/mL-FEU — ABNORMAL HIGH (ref 0.00–0.50)

## 2021-12-31 LAB — SEDIMENTATION RATE: Sed Rate: 26 mm/hr — ABNORMAL HIGH (ref 0–16)

## 2021-12-31 MED ORDER — IOHEXOL 350 MG/ML SOLN
80.0000 mL | Freq: Once | INTRAVENOUS | Status: AC | PRN
  Administered 2021-12-31: 80 mL via INTRAVENOUS

## 2021-12-31 MED ORDER — PREDNISONE 20 MG PO TABS
20.0000 mg | ORAL_TABLET | Freq: Every day | ORAL | Status: DC
Start: 2022-01-01 — End: 2022-01-04
  Administered 2022-01-01 – 2022-01-04 (×4): 20 mg via ORAL
  Filled 2021-12-31 (×4): qty 1

## 2021-12-31 MED ORDER — INSULIN ASPART 100 UNIT/ML IJ SOLN
0.0000 [IU] | Freq: Three times a day (TID) | INTRAMUSCULAR | Status: DC
  Administered 2021-12-31: 20 [IU] via SUBCUTANEOUS
  Administered 2022-01-01: 13:00:00 11 [IU] via SUBCUTANEOUS
  Administered 2022-01-01: 18:00:00 15 [IU] via SUBCUTANEOUS
  Administered 2022-01-01 – 2022-01-02 (×3): 11 [IU] via SUBCUTANEOUS
  Administered 2022-01-02: 10:00:00 4 [IU] via SUBCUTANEOUS
  Administered 2022-01-03: 11 [IU] via SUBCUTANEOUS
  Administered 2022-01-03 – 2022-01-04 (×3): 4 [IU] via SUBCUTANEOUS

## 2021-12-31 MED ORDER — INSULIN ASPART 100 UNIT/ML IJ SOLN
0.0000 [IU] | Freq: Three times a day (TID) | INTRAMUSCULAR | Status: DC
  Administered 2021-12-31: 5 [IU] via SUBCUTANEOUS

## 2021-12-31 MED ORDER — INSULIN DETEMIR 100 UNIT/ML ~~LOC~~ SOLN
10.0000 [IU] | Freq: Every day | SUBCUTANEOUS | Status: DC
  Administered 2021-12-31: 10 [IU] via SUBCUTANEOUS
  Filled 2021-12-31: qty 0.1

## 2021-12-31 MED ORDER — INSULIN ASPART 100 UNIT/ML IJ SOLN
0.0000 [IU] | Freq: Every day | INTRAMUSCULAR | Status: DC
  Administered 2021-12-31: 4 [IU] via SUBCUTANEOUS
  Administered 2022-01-01: 22:00:00 5 [IU] via SUBCUTANEOUS
  Administered 2022-01-02: 22:00:00 2 [IU] via SUBCUTANEOUS

## 2021-12-31 MED ORDER — INSULIN ASPART 100 UNIT/ML IJ SOLN: 0.0000 [IU] | Freq: Three times a day (TID) | INTRAMUSCULAR | Status: DC

## 2021-12-31 MED ORDER — LIVING WELL WITH DIABETES BOOK
Freq: Once | Status: AC
  Filled 2021-12-31: qty 1

## 2021-12-31 NOTE — Progress Notes (Signed)
Christian [name]  VZD:[id number] DOB: 05-Jun-1946 DOA: 12/26/2021 PCP: [name], MD    Brief Narrative:  76yo with a history of NSCLC status post RML/LLL lobectomies with lymph node dissection and SBRT to bilateral adrenal glands who presented to the ED 12/26/2021 with progressive shortness of breath.  He had previously been seen in the ER 12/24/2021 at which time he tested positive for COVID.  While in the ED he was noted to have frequent uncontrollable movements, which the family confirmed have been present for approximately 1-2 years but has recently worsened.  Consultants:  Neurology PCCM  Code Status: FULL CODE  Antimicrobials:  None  DVT prophylaxis: Subcutaneous heparin  Interim Hx: Resting in bed.  Appears dyspneic to observation.  States his breathing is improved compared to when he came in, but that he does still feel short of breath.  Denies chest pain nausea vomiting or abdominal pain.  Complains of constipation.  Assessment & Plan:  COVID infection - possible COVID-pneumonia Dr. Sherral Hammers has been treating with Remdesivir and Solu-Medrol - has completed a full 5-day course of Remdesivir -remains on Solu-Medrol - agree w/ Dr. 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  Persistent dyspnea  Likely multifactorial - ACEi and BB to be avoided per PCCM -given multiple risk factors I feel it is prudent to rule out pulmonary embolism as a contributing factor -I have discussed this with the patient and a CTa  has been ordered  Elevated d-dimer  Noted in an obese pt with a hx of CA and CoViD - given presenting sx we will rule out PE  OSA on CPAP  Chorea Has been evaluated by neurology who suggested outpatient follow-up  CKD stage IIIa Renal function is stable  Elevated troponin Felt to be most consistent with demand ischemia -no further work-up planned at this time apart from evaluating for PE  Mixed chronic anemia Hemoglobin is stable  HTN Blood pressure modestly elevated -follow without change in treatment plan today  History of CAD No symptoms to suggest [place] PE  Anxiety  Obesity - Body mass index is 36.62 kg/m.  DM2 uncontrolled with hyperglycemia CBG variable but still above goal -adjust treatment and follow  Family Communication: Discussed plan of care with patient and family at bedside Disposition:   Objective: Blood pressure (!) 157/77, pulse (!) 58, temperature 98.2 F (36.8 C), temperature source Oral, resp. rate 20, height 6' (1.829 m), weight 122.5 kg, SpO2 95 %.  Intake/Output Summary (Last 24 hours) at 12/31/2021 1638 Last data filed at 12/31/2021 1000 Gross per 24 hour  Intake 840 ml  Output 700 ml  Net 140 ml    Filed Weights   12/26/21 0934  Weight: 122.5 kg    Examination: General: Modestly tachypneic, able to complete full sentences Lungs: Distant breath sounds appreciable in all fields with  no active wheeze Cardiovascular: Distant heart sounds related to body habitus without murmur or rub appreciable Abdomen: Obese, soft, bowel sounds positive, no rebound Extremities: 1+ chronic appearing bilateral lower extremity edema  CBC: Recent Labs  Lab 12/29/21 0513 12/30/21 0523 12/31/21 0449  WBC 11.7* 8.1 11.7*  NEUTROABS 10.3* 7.0 9.5*  HGB 10.9* 11.2* 11.9*  HCT 34.1* 35.1* 36.6*  MCV 107.2* 106.7* 107.0*  PLT 132* 162 790    Basic Metabolic Panel: Recent Labs  Lab 12/29/21 0513 12/30/21 0523 12/31/21 0449  NA 137 137 137  K 5.0  4.8 4.5  CL 106 104 103  CO2 25 27 30   GLUCOSE 242* 314* 227*  BUN 33* 35* 34*  CREATININE 1.15 1.17 1.18  CALCIUM 8.9 9.2 9.1  MG 2.4 2.6* 2.5*    GFR: Estimated Creatinine Clearance: 75.4 mL/min (by C-G formula based on SCr of 1.18 mg/dL).  Liver Function Tests: Recent Labs  Lab 12/28/21 0449 12/29/21 0513 12/30/21 0523 12/31/21 0449  AST 45* 38 41 48*  ALT 30 32 48* 70*  ALKPHOS 63 55 60 65  BILITOT 0.3 0.5 0.5 0.5  PROT 7.0 6.4* 6.7 6.6  ALBUMIN 3.7 3.3* 3.4* 3.4*     HbA1C: Hgb A1c MFr Bld  Date/Time Value Ref Range Status  11/13/2021 12:46 PM 9.7 (H) 4.6 - 6.5 % Final    Comment:    Glycemic Control Guidelines for People with Diabetes:Non Diabetic:  <6%Goal of Therapy: <7%Additional Action Suggested:  >8%   10/18/2020 09:13 AM 8.7 (H) 4.6 - 6.5 % Final    Comment:    Glycemic Control Guidelines for People with Diabetes:Non Diabetic:  <6%Goal of Therapy: <7%Additional Action Suggested:  >8%     CBG: Recent Labs  Lab 12/30/21 1946 12/30/21 2340 12/31/21 0347 12/31/21 0808 12/31/21 1150  GLUCAP 357* 393* 277* 167* 221*     Recent Results (from the past 240 hour(s))  Resp Panel by RT-PCR (Flu A&B, Covid) Nasopharyngeal Swab     Status: Abnormal   Collection Time: 12/24/21  5:00 PM   Specimen: Nasopharyngeal Swab; Nasopharyngeal(NP) swabs in vial transport medium  Result Value Ref Range Status   SARS Coronavirus 2 by RT PCR POSITIVE (A) NEGATIVE Final    Comment: (NOTE) SARS-CoV-2 target nucleic acids are DETECTED.  The SARS-CoV-2 RNA is generally detectable in upper respiratory specimens during the acute phase of infection. Positive results are indicative of the presence of the identified virus, but do not rule out bacterial infection or co-infection with other pathogens not detected by the test. Clinical correlation with patient history and other diagnostic information is necessary to determine patient infection status. The expected result is  Negative.  Fact Sheet for Patients: EntrepreneurPulse.com.au  Fact Sheet for Healthcare Providers: IncredibleEmployment.be  This test is not yet approved or cleared by the [place] FDA and  has been authorized for detection and/or diagnosis of SARS-CoV-2 by FDA under an Emergency Use Authorization (EUA).  This EUA will remain in effect (meaning this test can be used) for the duration of  the COVID-19 declaration under Section 564(b)(1) of the A ct, 21 U.S.C. section 360bbb-3(b)(1), unless the authorization is terminated or revoked sooner.     Influenza A by PCR NEGATIVE NEGATIVE Final   Influenza B by PCR NEGATIVE NEGATIVE Final    Comment: (NOTE) The Xpert Xpress SARS-CoV-2/FLU/RSV plus assay is intended as an aid in the diagnosis of influenza from Nasopharyngeal swab specimens and should not be used as a sole basis  for treatment. Nasal washings and aspirates are unacceptable for Xpert Xpress SARS-CoV-2/FLU/RSV testing.  Fact Sheet for Patients: EntrepreneurPulse.com.au  Fact Sheet for Healthcare Providers: IncredibleEmployment.be  This test is not yet approved or cleared by the [place] FDA and has been authorized for detection and/or diagnosis of SARS-CoV-2 by FDA under an Emergency Use Authorization (EUA). This EUA will remain in effect (meaning this test can be used) for the duration of the COVID-19 declaration under Section 564(b)(1) of the Act, 21 U.S.C. section 360bbb-3(b)(1), unless the authorization is terminated or revoked.  Performed at [facility], [place] [address], [place], [place] [address]       Scheduled Meds:  baclofen  20 mg Oral BID   clonazePAM  0.5 mg Oral BID   folic acid  1 mg Oral Daily   heparin  5,000 Units Subcutaneous Q8H   insulin aspart  0-15 Units Subcutaneous TID WC   ipratropium-albuterol  3 mL Nebulization BID   mouth rinse  15 mL Mouth Rinse BID    metoprolol succinate  25 mg Oral QHS   multivitamin with minerals  1 tablet Oral Daily   [START ON 01/01/2022] predniSONE  20 mg Oral Q breakfast   sodium chloride flush  3 mL Intravenous Q12H   thiamine  100 mg Oral Daily   traZODone  50 mg Oral QHS   Continuous Infusions:  sodium chloride       LOS: 5 days   Cherene Altes, MD [facility] Office  [phone] Pager - Text Page per Shea Evans  If 7PM-7AM, please contact night-coverage per Amion 12/31/2021, 4:38 PM

## 2021-12-31 NOTE — Progress Notes (Signed)
No real improvement since admission, however does feel better with oxygen  NAME:  Christian [name], MRN:  [id number], DOB:  28-Aug-1946, LOS: 5 ADMISSION DATE:  12/26/2021, CONSULTATION DATE:  12/30/21 REFERRING MD:  Chesley Noon, CHIEF COMPLAINT:  sob/covid pos   History of Present Illness:   76 yo male quit smoking 3 y PTA maint on ACEi prior to admit with unexplained doe s/p   s/p RML/RLL bilobectomies with LN dissection and s/p SBRT to L&R adrenal glands (July 2020 and Feb 20222 respectively) for [facility] lung ca and  presented to the ER with worsening shortness of breath at home.  He has also been had referral to pulmonology to see about possibly being started on home oxygen however has not had any documented hypoxia and had not been seen by pulmonary prior to admit with last  PFTs 12/19/17: FVC 3.60 (75%), FEV1 2.80 (79%), DLCO unc 24.35 (69%).  He was last seen by oncology in October 2022.  He was  evaluated in the ER on 12/24/2021 with similar symptoms and his COVID testing from that time was positive. He was noted to be tachypneic on evaluation but no hypoxia.  He was however placed on oxygen for comfort in the ER.   Baseline says was able to walk 100 y to MB but last did so 6-8 m ago.  Mild dysphagia, mild sensation of pnds but no real cough.  Does have orthopnea but also back has hurt lying flat about the same period of time.  Ace I stopped.   Significant Hospital Events: Including procedures, antibiotic start and stop dates in addition to other pertinent events   1/22 seen by pulm in consult. Felt dyspnea out of proportion to radiographic findings. There was no desaturation. Prior PFTs negative for obstructive disease. Sniff >>> ordered as outpt given concern for possible diaphragmatic dysfxn.      Interim History / Subjective:  No real improvement since admission however does feel better with oxygen  Objective   Blood pressure (Abnormal) 157/77, pulse (Abnormal) 58, temperature 98.2 F (36.8  C), temperature source Oral, resp. rate 20, height 6' (1.829 m), weight 122.5 kg, SpO2 95 %.        Intake/Output Summary (Last 24 hours) at 12/31/2021 1558 Last data filed at 12/31/2021 1000 Gross per 24 hour  Intake 840 ml  Output 700 ml  Net 140 ml   Filed Weights   12/26/21 0934  Weight: 122.5 kg    Examination: General obese 76 year old male patient lying in bed currently no acute distress but does exhibit exertional dyspnea even when he is attempting to talk HEENT normocephalic atraumatic no jugular venous distention coarse upper airway rhonchus Pulmonary scattered rhonchi, mild accessory use involving upper accessory muscles no diaphragmatic assistance noted Cardiac regular rate and rhythm Abdomen obese soft Extremities trace edema Neuro awake with choreatic type movements of the upper extremities GU clear yellow  Assessment & Plan:   Covid Acute on chronic sob disproportionate to gas exchange or infiltrates s/p RML and lower lobe bilobectomies obesity with no insp movement of abdomen w/ 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 obesity, Several underlying co-morbids: prior RML and bilateral Lower lobe Bilobectomy for  adenocarcinoma (2019) and s/p XRT to left lingula for nodule, now acutely worse w/ RLL pna which could  be from [place] (he's covid positive) OR aspiration. I agree w/ Dr Melvyn Novas that he has no diaphragmatic effort in spite of his perceived dyspnea raising concern for diaphragmatic dysfxn   Plan Continue supplemental oxygen for sats > 90% CT chest (already ordered) agree Swallow eval when work of breathing improved.  Consider ECHO Treat COVID Sniff test as out-pt  F/u chorea labs

## 2021-12-31 NOTE — Care Management Important Message (Signed)
Important Message  Patient Details IM Letter placed in Patients room. Name: CADAN MAGGART MRN: [id number] Date of Birth: 12-15-1945   Medicare Important Message Given:  Yes     Kerin Salen 12/31/2021, 11:05 AM

## 2022-01-01 LAB — COMPREHENSIVE METABOLIC PANEL
ALT: 81 U/L — ABNORMAL HIGH (ref 0–44)
AST: 42 U/L — ABNORMAL HIGH (ref 15–41)
Albumin: 3.3 g/dL — ABNORMAL LOW (ref 3.5–5.0)
Alkaline Phosphatase: 63 U/L (ref 38–126)
Anion gap: 4 — ABNORMAL LOW (ref 5–15)
BUN: 38 mg/dL — ABNORMAL HIGH (ref 8–23)
CO2: 30 mmol/L (ref 22–32)
Calcium: 9 mg/dL (ref 8.9–10.3)
Chloride: 102 mmol/L (ref 98–111)
Creatinine, Ser: 1.3 mg/dL — ABNORMAL HIGH (ref 0.61–1.24)
GFR, Estimated: 58 mL/min — ABNORMAL LOW (ref >60)
Glucose, Bld: 326 mg/dL — ABNORMAL HIGH (ref 70–99)
Potassium: 4.7 mmol/L (ref 3.5–5.1)
Sodium: 136 mmol/L (ref 135–145)
Total Bilirubin: 0.6 mg/dL (ref 0.3–1.2)
Total Protein: 6.5 g/dL (ref 6.5–8.1)

## 2022-01-01 LAB — CBC
HCT: 35.8 % — ABNORMAL LOW (ref 39.0–52.0)
Hemoglobin: 11.6 g/dL — ABNORMAL LOW (ref 13.0–17.0)
MCH: 34.9 pg — ABNORMAL HIGH (ref 26.0–34.0)
MCHC: 32.4 g/dL (ref 30.0–36.0)
MCV: 107.8 fL — ABNORMAL HIGH (ref 80.0–100.0)
Platelets: 205 10*3/uL (ref 150–400)
RBC: 3.32 MIL/uL — ABNORMAL LOW (ref 4.22–5.81)
RDW: 14.6 % (ref 11.5–15.5)
WBC: 12.2 10*3/uL — ABNORMAL HIGH (ref 4.0–10.5)
nRBC: 0.7 % — ABNORMAL HIGH (ref 0.0–0.2)

## 2022-01-01 LAB — GLUCOSE, CAPILLARY
Glucose-Capillary: 273 mg/dL — ABNORMAL HIGH (ref 70–99)
Glucose-Capillary: 276 mg/dL — ABNORMAL HIGH (ref 70–99)
Glucose-Capillary: 305 mg/dL — ABNORMAL HIGH (ref 70–99)
Glucose-Capillary: 312 mg/dL — ABNORMAL HIGH (ref 70–99)
Glucose-Capillary: 356 mg/dL — ABNORMAL HIGH (ref 70–99)

## 2022-01-01 LAB — MAGNESIUM: Magnesium: 2.4 mg/dL (ref 1.7–2.4)

## 2022-01-01 MED ORDER — INSULIN DETEMIR 100 UNIT/ML ~~LOC~~ SOLN
14.0000 [IU] | Freq: Two times a day (BID) | SUBCUTANEOUS | Status: DC
  Administered 2022-01-01 – 2022-01-04 (×6): 14 [IU] via SUBCUTANEOUS
  Filled 2022-01-01 (×7): qty 0.14

## 2022-01-01 MED ORDER — LIVING WELL WITH DIABETES BOOK
Freq: Once | Status: AC
  Filled 2022-01-01: qty 1

## 2022-01-01 MED ORDER — BENZONATATE 100 MG PO CAPS
100.0000 mg | ORAL_CAPSULE | Freq: Three times a day (TID) | ORAL | Status: DC | PRN
  Administered 2022-01-03: 100 mg via ORAL
  Filled 2022-01-01: qty 1

## 2022-01-01 MED ORDER — NICOTINE 21 MG/24HR TD PT24
21.0000 mg | MEDICATED_PATCH | Freq: Every day | TRANSDERMAL | Status: DC
  Administered 2022-01-01 – 2022-01-04 (×4): 21 mg via TRANSDERMAL
  Filled 2022-01-01 (×4): qty 1

## 2022-01-01 MED ORDER — QUETIAPINE FUMARATE 25 MG PO TABS
25.0000 mg | ORAL_TABLET | Freq: Every day | ORAL | Status: DC
  Administered 2022-01-01 – 2022-01-03 (×3): 25 mg via ORAL
  Filled 2022-01-01 (×3): qty 1

## 2022-01-01 MED ORDER — INSULIN DETEMIR 100 UNIT/ML ~~LOC~~ SOLN
10.0000 [IU] | Freq: Two times a day (BID) | SUBCUTANEOUS | Status: DC
  Administered 2022-01-01: 13:00:00 10 [IU] via SUBCUTANEOUS
  Filled 2022-01-01 (×2): qty 0.1

## 2022-01-01 MED ORDER — INSULIN ASPART 100 UNIT/ML IJ SOLN
6.0000 [IU] | Freq: Three times a day (TID) | INTRAMUSCULAR | Status: DC
  Administered 2022-01-02 – 2022-01-04 (×7): 6 [IU] via SUBCUTANEOUS

## 2022-01-01 MED ORDER — CYANOCOBALAMIN 1000 MCG/ML IJ SOLN
1000.0000 ug | Freq: Every day | INTRAMUSCULAR | Status: AC
  Administered 2022-01-01 – 2022-01-03 (×3): 1000 ug via SUBCUTANEOUS
  Filled 2022-01-01 (×4): qty 1

## 2022-01-01 NOTE — Progress Notes (Signed)
Vaping pen that patient gave nurse earlier this shift sent home with son.  Patient aware.   Virginia Rochester, RN

## 2022-01-01 NOTE — Progress Notes (Addendum)
Christian [name]  ZHY:[id number] DOB: 1946-04-20 DOA: 12/26/2021 PCP: [name], MD    Brief Narrative:  76yo with a history of NSCLC status post RML/LLL lobectomies with lymph node dissection and SBRT to bilateral adrenal glands who presented to the ED 12/26/2021 with progressive shortness of breath.  He had previously been seen in the ER 12/24/2021 at which time he tested positive for COVID.  While in the ED he was noted to have frequent uncontrollable movements, which the family confirmed have been present for approximately 1-2 years but have recently worsened.  Consultants:  Neurology PCCM  Code Status: FULL CODE  Antimicrobials:  None  DVT prophylaxis: Subcutaneous heparin  Interim Hx: Events of last night noted.  In short patient was vaping in his room and refused to stop.  After significant time was spent by multiple staff members and family members he was ultimately convinced to surrender his vaping device and remain in the hospital.  At the time of my visit today he appears to still be dyspneic.  He reports that he feels better overall.  He denies chest pain nausea or vomiting.  He states he feels extremely weak.  His son was visiting and spoke with me in the hall after my visit and explained that his father is acting differently and that from his baseline and appears confused, reporting that his behavior last night was absolutely not typical of him.  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   Metastatic non-small cell lung cancer status post bilateral lobectomies - enlarging noncalcified pulmonary nodule within the right upper lobe  CTat this admit noted increase in size of this known nodule from 7 > 52mm - serial ouptpt f/u indicated - care per Pulmonary    Altered mental status - acute delirium Likely due to a combination of steroid use, sundowning, and hyperglycemia -trial of low-dose Seroquel tonight -adjust insulin dosing  Persistent dyspnea  Likely multifactorial - ACEi and BB to be avoided per PCCM - CTa negative for PE -sniff test ordered per pulmonary to evaluate for suspected diaphragmatic paralysis -oxygen saturations much improved -wean oxygen as able  Elevated d-dimer  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 and acute illness - no PE on CTa   OSA on CPAP  Chorea Has been evaluated by Neurology who suggests outpatient follow-up - multiple labs obtained but have not clearly delineated a cause thus far   CKD stage IIIa Creatinine has increased slightly this AM, coincident w/ contrasted CT yesterday - follow trend   Elevated troponin Felt to be most consistent with demand ischemia -no further work-up planned at this time apart from evaluating for PE  Mixed chronic anemia Hemoglobin is stable  HTN Adjust medical therapy and follow  History of CAD No symptoms to suggest USAP  Anxiety  Obesity - Body mass index is 36.62 kg/m.  DM2 uncontrolled with hyperglycemia Remains poorly controlled w/ ongoing steroid dosing - adjust tx and follow trend   Family Communication: Spoke with son at bedside and in hallway Disposition: From home -eventual return to home -needs PT/OT -remains deconditioned  Objective: Blood pressure (!) 146/72, pulse 62, temperature 98.1 F (36.7 C), temperature source Oral, resp. rate 18, height 6' (1.829 m), weight 122.5 kg, SpO2 99 %.  Intake/Output Summary (Last 24 hours)  at 01/01/2022 8144 Last data filed at 01/01/2022 0900 Gross per 24 hour  Intake 840 ml  Output 2650 ml  Net -1810 ml    Filed Weights   12/26/21 0934  Weight: 122.5 kg    Examination: General: Modestly tachypneic, able to complete full sentences -alert Lungs: Distant breath sounds appreciable in all fields with no active  wheeze Cardiovascular: Distant heart sounds related to body habitus -regular rate -no murmur Abdomen: Obese, soft, bowel sounds positive, no rebound Extremities: 1+ chronic appearing bilateral lower extremity edema  CBC: Recent Labs  Lab 12/29/21 0513 12/30/21 0523 12/31/21 0449 01/01/22 0459  WBC 11.7* 8.1 11.7* 12.2*  NEUTROABS 10.3* 7.0 9.5*  --   HGB 10.9* 11.2* 11.9* 11.6*  HCT 34.1* 35.1* 36.6* 35.8*  MCV 107.2* 106.7* 107.0* 107.8*  PLT 132* 162 191 818    Basic Metabolic Panel: Recent Labs  Lab 12/30/21 0523 12/31/21 0449 01/01/22 0459  NA 137 137 136  K 4.8 4.5 4.7  CL 104 103 102  CO2 27 30 30   GLUCOSE 314* 227* 326*  BUN 35* 34* 38*  CREATININE 1.17 1.18 1.30*  CALCIUM 9.2 9.1 9.0  MG 2.6* 2.5* 2.4    GFR: Estimated Creatinine Clearance: 68.4 mL/min (A) (by C-G formula based on SCr of 1.3 mg/dL (H)).  Liver Function Tests: Recent Labs  Lab 12/29/21 0513 12/30/21 0523 12/31/21 0449 01/01/22 0459  AST 38 41 48* 42*  ALT 32 48* 70* 81*  ALKPHOS 55 60 65 63  BILITOT 0.5 0.5 0.5 0.6  PROT 6.4* 6.7 6.6 6.5  ALBUMIN 3.3* 3.4* 3.4* 3.3*     HbA1C: Hgb A1c MFr Bld  Date/Time Value Ref Range Status  11/13/2021 12:46 PM 9.7 (H) 4.6 - 6.5 % Final    Comment:    Glycemic Control Guidelines for People with Diabetes:Non Diabetic:  <6%Goal of Therapy: <7%Additional Action Suggested:  >8%   10/18/2020 09:13 AM 8.7 (H) 4.6 - 6.5 % Final    Comment:    Glycemic Control Guidelines for People with Diabetes:Non Diabetic:  <6%Goal of Therapy: <7%Additional Action Suggested:  >8%     CBG: Recent Labs  Lab 12/31/21 0808 12/31/21 1150 12/31/21 1644 12/31/21 2100 01/01/22 0744  GLUCAP 167* 221* 383* 336* 276*     Recent Results (from the past 240 hour(s))  Resp Panel by RT-PCR (Flu A&B, Covid) Nasopharyngeal Swab     Status: Abnormal   Collection Time: 12/24/21  5:00 PM   Specimen: Nasopharyngeal Swab; Nasopharyngeal(NP) swabs in vial transport medium   Result Value Ref Range Status   SARS Coronavirus 2 by RT PCR POSITIVE (A) NEGATIVE Final    Comment: (NOTE) SARS-CoV-2 target nucleic acids are DETECTED.  The SARS-CoV-2 RNA is generally detectable in upper respiratory specimens during the acute phase of infection. Positive results are indicative of the presence of the identified virus, but do not rule out bacterial infection or co-infection with other pathogens not detected by the test. Clinical correlation with patient history and other diagnostic information is necessary to determine patient infection status. The expected result is Negative.  Fact Sheet for Patients: EntrepreneurPulse.com.au  Fact Sheet for Healthcare Providers: IncredibleEmployment.be  This test is not yet approved or cleared by the [place] FDA and  has been authorized for detection and/or diagnosis of SARS-CoV-2 by FDA under an Emergency Use Authorization (EUA).  This EUA will remain in effect (meaning this test can be used) for the duration of  the COVID-19 declaration under Section  564(b)(1) of the A ct, 21 U.S.C. section 360bbb-3(b)(1), unless the authorization is terminated or revoked sooner.     Influenza A by PCR NEGATIVE NEGATIVE Final   Influenza B by PCR NEGATIVE NEGATIVE Final    Comment: (NOTE) The Xpert Xpress SARS-CoV-2/FLU/RSV plus assay is intended as an aid in the diagnosis of influenza from Nasopharyngeal swab specimens and should not be used as a sole basis for treatment. Nasal washings and aspirates are unacceptable for Xpert Xpress SARS-CoV-2/FLU/RSV testing.  Fact Sheet for Patients: EntrepreneurPulse.com.au  Fact Sheet for Healthcare Providers: IncredibleEmployment.be  This test is not yet approved or cleared by the [place] FDA and has been authorized for detection and/or diagnosis of SARS-CoV-2 by FDA under an Emergency Use Authorization (EUA).  This EUA will remain in effect (meaning this test can be used) for the duration of the COVID-19 declaration under Section 564(b)(1) of the Act, 21 U.S.C. section 360bbb-3(b)(1), unless the authorization is terminated or revoked.  Performed at [facility], [place] [address] [place], [place] [address]       Scheduled Meds:  baclofen  20 mg Oral BID   clonazePAM  0.5 mg Oral BID   folic acid  1 mg Oral Daily   heparin  5,000 Units Subcutaneous Q8H   insulin aspart  0-20 Units Subcutaneous TID WC   insulin aspart  0-5 Units Subcutaneous QHS   insulin detemir  10 Units Subcutaneous QHS   ipratropium-albuterol  3 mL Nebulization BID   living well with diabetes book   Does not apply Once   mouth rinse  15 mL Mouth Rinse BID   metoprolol succinate  25 mg Oral QHS   multivitamin with minerals  1 tablet Oral Daily   nicotine  21 mg Transdermal Daily   predniSONE  20 mg Oral Q breakfast   sodium chloride flush  3 mL Intravenous Q12H   thiamine  100 mg Oral Daily   traZODone  50 mg Oral QHS   Continuous Infusions:  sodium chloride       LOS: 6 days   Cherene Altes, MD [facility] Office  [phone] Pager - Text Page per Shea Evans  If 7PM-7AM, please contact night-coverage per Amion 01/01/2022, 9:38 AM

## 2022-01-01 NOTE — Progress Notes (Signed)
Security arrived to the floor to escort patient from the unit, but patient's daughter called and said that she was going to call the patient back and encourage him to give up the vape because he will die if he is leaving AMA. She spoke with Tom CN and he said he would allow her to call and speak with him. Patient gave his vape to security and I gave the vape to [place] day shift CN.

## 2022-01-01 NOTE — Progress Notes (Signed)
Pt no longer has IV access. According to day shift nurse and pt the IV was removed by the pt earlier today. Offer to start a new IV for the pt and pt states that he would like to wait until he actually needs an IV before we start a new one. Pt does not currently have any IV medication or fluids ordered. Pt declined starting a new IV at this time.

## 2022-01-01 NOTE — Progress Notes (Addendum)
Inpatient Diabetes Program Recommendations  AACE/ADA: New Consensus Statement on Inpatient Glycemic Control (2015)  Target Ranges:  Prepandial:   less than 140 mg/dL      Peak postprandial:   less than 180 mg/dL (1-2 hours)      Critically ill patients:  140 - 180 mg/dL   Lab Results  Component Value Date   GLUCAP 276 (H) 01/01/2022   HGBA1C 9.7 (H) 11/13/2021    Review of Glycemic Control  Latest Reference Range & Units 12/31/21 08:08 12/31/21 11:50 12/31/21 16:44 12/31/21 21:00 01/01/22 07:44  Glucose-Capillary 70 - 99 mg/dL 167 (H) 221 (H) 383 (H) 336 (H) 276 (H)  (H): Data is abnormally high  Diabetes history: DM2 Outpatient Diabetes medications:  Metformin 500 mg BID Current orders for Inpatient glycemic control:  Levemir 10 QHS, Novolog 0-20 units TID and 0-5 QHS, Prednisone 20 mg daily  Inpatient Diabetes Program Recommendations:    Levemir 10 units BID  Ordered LWWD booklet.  Will speak with patient today if appropriate.  Might consider SGLT2i at DC.  Addendum@1300 :  Spoke with Christian [name] on the phone.  Reviewed patient's A1c from 11/13/21 of 9.7% (average blood sugar of 232 mg/dL). Explained what a A1c is and what it measures. Also reviewed goal A1c with patient, importance of good glucose control @ home, and blood sugar goals.  He states his PCP recently increased his Metformin to BID.  He is current with Dr. Damita Dunnings.  He does not check his blood sugar at home and does not have a glucometer.    MD at DC please order glucometer and supplies. # [id number]  Asked him to check his fasting glucose QD.  Discussed long and short term complications of uncontrolled glucose.  He does not drink any beverages with sugar.  Educated on The Plate Method, CHO's, and importance of close follow up with PCP.  HE should bring his meter to his PCP follow up appointment for review.     Will continue to follow while inpatient.  Thank you, [name], MSN, RN Diabetes  Coordinator Inpatient Diabetes Program [phone] (team pager from 8a-5p)

## 2022-01-01 NOTE — Progress Notes (Signed)
Patient is vaping in his room. I told the patient that vaping is not permitted and patient refused to let me have the vape and secure it with security. Charge nurse notified and he will call security.

## 2022-01-01 NOTE — Progress Notes (Signed)
Christian [name] [facility] notified that patient said he wasn't leaving until he can get his son on the phone for transportation. Patient is refusing to put his clothes on and said he will get dressed and leave when he gets ready. Security is coming to escort patient out of the hospital.

## 2022-01-01 NOTE — Progress Notes (Signed)
IV, tele, and O2 removed.

## 2022-01-01 NOTE — Progress Notes (Signed)
Nutrition Note  RD consulted for nutrition education regarding diabetes.   Lab Results  Component Value Date   HGBA1C 9.7 (H) 11/13/2021    RD providing "Carbohydrate Counting for People with Diabetes" and "Plate Method" handout from the Academy of Nutrition and Dietetics. Discussed different food groups and their effects on blood sugar, emphasizing carbohydrate-containing foods. Provided list of carbohydrates and recommended serving sizes of common foods.  Discussed importance of controlled and consistent carbohydrate intake throughout the day. Provided examples of ways to balance meals/snacks and encouraged intake of high-fiber, whole grain complex carbohydrates. Teach back method used.  Expect fair compliance. Recommend outpatient education to reinforce diet principles.  Pt reports his appetite is good today. Pt  Body mass index is 36.62 kg/m. Pt meets criteria for obesity based on current BMI.  Current diet order is CHO modified, patient is consuming approximately 100% of meals at this time. Labs and medications reviewed. No further nutrition interventions warranted at this time. If additional nutrition issues arise, please re-consult RD.  Clayton Bibles, MS, RD, LDN Inpatient Clinical Dietitian Contact information available via Amion

## 2022-01-01 NOTE — Discharge Instructions (Addendum)
Carbohydrate Counting For People With Diabetes  Foods with carbohydrates make your blood glucose level go up. Learning how to count carbohydrates can help you control your blood glucose levels. First, identify the foods you eat that contain carbohydrates. Then, using the Foods with Carbohydrates chart, determine about how much carbohydrates are in your meals and snacks. Make sure you are eating foods with fiber, protein, and healthy fat along with your carbohydrate foods. Foods with Carbohydrates The following table shows carbohydrate foods that have about 15 grams of carbohydrate each. Using measuring cups, spoons, or a food scale when you first begin learning about carbohydrate counting can help you learn about the portion sizes you typically eat. The following foods have 15 grams carbohydrate each:  Grains 1 slice bread (1 ounce)  1 small tortilla (6-inch size)   large bagel (1 ounce)  1/3 cup pasta or rice (cooked)   hamburger or hot dog bun ( ounce)   cup cooked cereal   to  cup ready-to-eat cereal  2 taco shells (5-inch size) Fruit 1 small fresh fruit ( to 1 cup)   medium banana  17 small grapes (3 ounces)  1 cup melon or berries   cup canned or frozen fruit  2 tablespoons dried fruit (blueberries, cherries, cranberries, raisins)   cup unsweetened fruit juice  Starchy Vegetables  cup cooked beans, peas, corn, potatoes/sweet potatoes   large baked potato (3 ounces)  1 cup acorn or butternut squash  Snack Foods 3 to 6 crackers  8 potato chips or 13 tortilla chips ( ounce to 1 ounce)  3 cups popped popcorn  Dairy 3/4 cup (6 ounces) nonfat plain yogurt, or yogurt with sugar-free sweetener  1 cup milk  1 cup plain rice, soy, coconut or flavored almond milk Sweets and Desserts  cup ice cream or frozen yogurt  1 tablespoon jam, jelly, pancake syrup, table sugar, or honey  2 tablespoons light pancake syrup  1 inch square of frosted cake or 2 inch square of unfrosted  cake  2 small cookies (2/3 ounce each) or  large cookie  Sometimes youll have to estimate carbohydrate amounts if you dont know the exact recipe. One cup of mixed foods like soups can have 1 to 2 carbohydrate servings, while some casseroles might have 2 or more servings of carbohydrate. Foods that have less than 20 calories in each serving can be counted as free foods. Count 1 cup raw vegetables, or  cup cooked non-starchy vegetables as free foods. If you eat 3 or more servings at one meal, then count them as 1 carbohydrate serving.  Foods without Carbohydrates  Not all foods contain carbohydrates. Meat, some dairy, fats, non-starchy vegetables, and many beverages dont contain carbohydrate. So when you count carbohydrates, you can generally exclude chicken, pork, beef, fish, seafood, eggs, tofu, cheese, butter, sour cream, avocado, nuts, seeds, olives, mayonnaise, water, black coffee, unsweetened tea, and zero-calorie drinks. Vegetables with no or low carbohydrate include green beans, cauliflower, tomatoes, and onions. How much carbohydrate should I eat at each meal?  Carbohydrate counting can help you plan your meals and manage your weight. Following are some starting points for carbohydrate intake at each meal. Work with your registered dietitian nutritionist to find the best range that works for your blood glucose and weight.   To Lose Weight To Maintain Weight  Women 2 - 3 carb servings 3 - 4 carb servings  Men 3 - 4 carb servings 4 - 5 carb servings  Checking your  blood glucose after meals will help you know if you need to adjust the timing, type, or number of carbohydrate servings in your meal plan. Achieve and keep a healthy body weight by balancing your food intake and physical activity.  Tips How should I plan my meals?  Plan for half the food on your plate to include non-starchy vegetables, like salad greens, broccoli, or carrots. Try to eat 3 to 5 servings of non-starchy vegetables  every day. Have a protein food at each meal. Protein foods include chicken, fish, meat, eggs, or beans (note that beans contain carbohydrate). These two food groups (non-starchy vegetables and proteins) are low in carbohydrate. If you fill up your plate with these foods, you will eat less carbohydrate but still fill up your stomach. Try to limit your carbohydrate portion to  of the plate.  What fats are healthiest to eat?  Diabetes increases risk for heart disease. To help protect your heart, eat more healthy fats, such as olive oil, nuts, and avocado. Eat less saturated fats like butter, cream, and high-fat meats, like bacon and sausage. Avoid trans fats, which are in all foods that list partially hydrogenated oil as an ingredient. What should I drink?  Choose drinks that are not sweetened with sugar. The healthiest choices are water, carbonated or seltzer waters, and tea and coffee without added sugars.  Sweet drinks will make your blood glucose go up very quickly. One serving of soda or energy drink is  cup. It is best to drink these beverages only if your blood glucose is low.  Artificially sweetened, or diet drinks, typically do not increase your blood glucose if they have zero calories in them. Read labels of beverages, as some diet drinks do have carbohydrate and will raise your blood glucose. Label Reading Tips Read Nutrition Facts labels to find out how many grams of carbohydrate are in a food you want to eat. Dont forget: sometimes serving sizes on the label arent the same as how much food you are going to eat, so you may need to calculate how much carbohydrate is in the food you are serving yourself.   Carbohydrate Counting for People with Diabetes Sample 1-Day Menu  Breakfast  cup yogurt, low fat, low sugar (1 carbohydrate serving)   cup cereal, ready-to-eat, unsweetened (1 carbohydrate serving)  1 cup strawberries (1 carbohydrate serving)   cup almonds ( carbohydrate serving)   Lunch 1, 5 ounce can chunk light tuna  2 ounces cheese, low fat cheddar  6 whole wheat crackers (1 carbohydrate serving)  1 small apple (1 carbohydrate servings)   cup carrots ( carbohydrate serving)   cup snap peas  1 cup 1% milk (1 carbohydrate serving)   Evening Meal Stir fry made with: 3 ounces chicken  1 cup brown rice (3 carbohydrate servings)   cup broccoli ( carbohydrate serving)   cup green beans   cup onions  1 tablespoon olive oil  2 tablespoons teriyaki sauce ( carbohydrate serving)  Evening Snack 1 extra small banana (1 carbohydrate serving)  1 tablespoon peanut butter   Carbohydrate Counting for People with Diabetes Vegan Sample 1-Day Menu  Breakfast 1 cup cooked oatmeal (2 carbohydrate servings)   cup blueberries (1 carbohydrate serving)  2 tablespoons flaxseeds  1 cup soymilk fortified with calcium and vitamin D  1 cup coffee  Lunch 2 slices whole wheat bread (2 carbohydrate servings)   cup baked tofu   cup lettuce  2 slices tomato  2 slices avocado  cup baby carrots ( carbohydrate serving)  1 orange (1 carbohydrate serving)  1 cup soymilk fortified with calcium and vitamin D   Evening Meal Burrito made with: 1 6-inch corn tortilla (1 carbohydrate serving)  1 cup refried vegetarian beans (2 carbohydrate servings)   cup chopped tomatoes   cup lettuce   cup salsa  1/3 cup brown rice (1 carbohydrate serving)  1 tablespoon olive oil for rice   cup zucchini   Evening Snack 6 small whole grain crackers (1 carbohydrate serving)  2 apricots ( carbohydrate serving)   cup unsalted peanuts ( carbohydrate serving)    Carbohydrate Counting for People with Diabetes Vegetarian (Lacto-Ovo) Sample 1-Day Menu  Breakfast 1 cup cooked oatmeal (2 carbohydrate servings)   cup blueberries (1 carbohydrate serving)  2 tablespoons flaxseeds  1 egg  1 cup 1% milk (1 carbohydrate serving)  1 cup coffee  Lunch 2 slices whole wheat bread (2 carbohydrate  servings)  2 ounces low-fat cheese   cup lettuce  2 slices tomato  2 slices avocado   cup baby carrots ( carbohydrate serving)  1 orange (1 carbohydrate serving)  1 cup unsweetened tea  Evening Meal Burrito made with: 1 6-inch corn tortilla (1 carbohydrate serving)   cup refried vegetarian beans (1 carbohydrate serving)   cup tomatoes   cup lettuce   cup salsa  1/3 cup brown rice (1 carbohydrate serving)  1 tablespoon olive oil for rice   cup zucchini  1 cup 1% milk (1 carbohydrate serving)  Evening Snack 6 small whole grain crackers (1 carbohydrate serving)  2 apricots ( carbohydrate serving)   cup unsalted peanuts ( carbohydrate serving)    Copyright 2020  Academy of [employer] and [employer]etics. All rights reserved.  Using Nutrition Labels: Carbohydrate  Serving Size  Look at the serving size. All the information on the label is based on this portion. Servings Per Container  The number of servings contained in the package. Guidelines for Carbohydrate  Look at the total grams of carbohydrate in the serving size.  1 carbohydrate choice = 15 grams of carbohydrate. Range of Carbohydrate Grams Per Choice  Carbohydrate Grams/Choice Carbohydrate Choices  6-10   11-20 1  21-25 1  26-35 2  36-40 2  41-50 3  51-55 3  56-65 4  66-70 4  71-80 5    Copyright 2020  Academy of [employer] and Dietetics. All rights reserved.   Plate Method for Diabetes   Foods with carbohydrates make your blood glucose level go up. The plate method is a simple way to meal plan and control the amount of carbohydrate you eat.         Use the following guidance to build a healthy plate to control carbohydrates. Divide a 9-inch plate into 3 sections, and consider your beverage the 4th section of your meal: Food Group Examples of Foods/Beverages for This Section of your Meal  Section 1: Non-starchy vegetables Fill  of your plate to include non-starchy vegetables Asparagus, broccoli,  brussels sprouts, cabbage, carrots, cauliflower, celery, cucumber, green beans, mushrooms, peppers, salad greens, tomatoes, or zucchini.  Section 2: Protein foods Fill  of your plate to include a lean protein Lean meat, poultry, fish, seafood, cheese, eggs, lean deli meat, tofu, beans, lentils, nuts or nut butters.  Section 3: Carbohydrate foods Fill  of your plate to include carbohydrate foods Whole grains, whole wheat bread, brown rice, whole grain pasta, polenta, corn tortillas, fruit, or starchy vegetables (potatoes, green peas, corn, beans,  acorn squash, and butternut squash). One cup of milk also counts as a food that contains carbohydrate.  Section 4: Beverage Choose water or a low-calorie drink for your beverage. Unsweetened tea, coffee, or flavored/sparkling water without added sugar.  Image reprinted with permission from The American [employer] Association.  Copyright 2022 by the American Diabetes Association.   Copyright 2022  [employer]y [employer]. All rights reserved

## 2022-01-01 NOTE — Progress Notes (Signed)
Per security and [place] Dance patient can give up the vape or be discharged because this is a smoke-free campus. Patient continue to refuse to give up the vape and will be discharged as instructed.

## 2022-01-01 NOTE — Progress Notes (Addendum)
° ° ° °                                               Against Medical Advice Patient at this time expresses desire to leave the Hospital immidiately, patient has been warned that this is not Medically advisable at this time, and can result in Medical complications like Death and Disability, patient understands and accepts the risks involved and assumes full responsibilty of this decision.  This patient has also been advised that if they feel the need for further medical assistance to return to any available ER or dial [phone].  Informed by Nursing staff that this patient has left care and has signed the form  Against Medical Advice on 01/01/2022 at [place] hrs  Gershon Cull BSN MSNA MSN [place]

## 2022-01-02 LAB — GLUCOSE, CAPILLARY
Glucose-Capillary: 198 mg/dL — ABNORMAL HIGH (ref 70–99)
Glucose-Capillary: 161 mg/dL — ABNORMAL HIGH (ref 70–99)
Glucose-Capillary: 276 mg/dL — ABNORMAL HIGH (ref 70–99)
Glucose-Capillary: 271 mg/dL — ABNORMAL HIGH (ref 70–99)
Glucose-Capillary: 249 mg/dL — ABNORMAL HIGH (ref 70–99)

## 2022-01-02 LAB — BASIC METABOLIC PANEL
Anion gap: 6 (ref 5–15)
BUN: 31 mg/dL — ABNORMAL HIGH (ref 8–23)
CO2: 32 mmol/L (ref 22–32)
Calcium: 9 mg/dL (ref 8.9–10.3)
Chloride: 101 mmol/L (ref 98–111)
Creatinine, Ser: 1.21 mg/dL (ref 0.61–1.24)
GFR, Estimated: >60 mL/min (ref >60)
Glucose, Bld: 205 mg/dL — ABNORMAL HIGH (ref 70–99)
Potassium: 3.9 mmol/L (ref 3.5–5.1)
Sodium: 139 mmol/L (ref 135–145)

## 2022-01-02 NOTE — Progress Notes (Signed)
PROGRESS NOTE    Christian [name]  GNF:[id number] DOB: 1946/05/11 DOA: 12/26/2021 PCP: [name], MD    Brief Narrative:  76yo with a history of NSCLC status post RML/LLL lobectomies with lymph node dissection and SBRT to bilateral adrenal glands who presented to the ED 12/26/2021 with progressive shortness of breath.  He had previously been seen in the ER 12/24/2021 at which time he tested positive for COVID.  While in the ED he was noted to have frequent uncontrollable movements, which the family confirmed have been present for approximately 1-2 years but have recently worsened.  Assessment & Plan:   Principal Problem:   COVID-19 virus infection Active Problems:   Anxiety state   Essential hypertension, benign   CAD (coronary artery disease)   OSA on CPAP   Chorea   Chronic kidney disease, stage 3a (HCC)   Elevated troponin   Macrocytic anemia   Obesity, Class II, BMI 35-39.9   Pneumonia due to COVID-19 virus   Demand ischemia (HCC)   DOE (dyspnea on exertion)   Acute respiratory distress  COVID infection - possible COVID-pneumonia - acute hypoxic respiratory failure has completed a full 5-day course of Remdesivir - remains on steroid for 10 day course - CTa suggests possible 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 Cont to wean as tolerated   Metastatic non-small cell lung cancer status post bilateral lobectomies - enlarging noncalcified pulmonary nodule within the right upper lobe  CTat this admit noted increase in size of this known nodule from 7 > 57mm - serial ouptpt f/u indicated - care per Pulmonary    Altered mental status - acute delirium Likely due to a combination of steroid use, sundowning, and hyperglycemia -cont to adjust insulin with goal of euglycemia -now on seroquel QHS   Persistent dyspnea  Likely multifactorial - ACEi and BB to be avoided per PCCM - CTa negative for PE -sniff test ordered per pulmonary to evaluate for  suspected diaphragmatic paralysis -oxygen saturations much improving slowly -Currently on 3LNC   Elevated d-dimer  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   DM2 uncontrolled with hyperglycemia Remains poorly controlled w/ ongoing steroid dosing - adjust tx and follow trend     DVT prophylaxis: Heparin subq Code Status: Full Family Communication: Pt in room, family not at bedside  Status is: Inpatient  Remains inpatient appropriate because: Severity of illness   Consultants:  Neurology PCCM  Procedures:    Antimicrobials: Anti-infectives (From admission, onward)    Start     Dose/Rate Route Frequency Ordered Stop   12/27/21 1000  remdesivir 100 mg in sodium chloride 0.9 % 100 mL IVPB       See Hyperspace for full Linked Orders Report.   100 mg 200 mL/hr over 30 Minutes  Intravenous Daily 12/26/21 1254 12/30/21 1033   12/26/21 1400  remdesivir 200 mg in sodium chloride 0.9% 250 mL IVPB       See Hyperspace for full Linked Orders Report.   200 mg 580 mL/hr over 30 Minutes Intravenous Once 12/26/21 1254 12/26/21 1600       Subjective: Reports feeling better today. Wanting to eventually be weaned off O2  Objective: Vitals:   01/02/22 0857 01/02/22 1014 01/02/22 1018 01/02/22 1137  BP:    111/86  Pulse:    68  Resp:    20  Temp:    98.5 F (36.9 C)  TempSrc:    Oral  SpO2: 96% 93%  96% 96%  Weight:      Height:        Intake/Output Summary (Last 24 hours) at 01/02/2022 1647 Last data filed at 01/02/2022 1600 Gross per 24 hour  Intake 1120 ml  Output 1300 ml  Net -180 ml   Filed Weights   12/26/21 0934  Weight: 122.5 kg    Examination: General exam: Awake, laying in bed, in nad Respiratory system: Increased respiratory effort, no audible wheezing Cardiovascular system: regular rate, s1, s2 Gastrointestinal system: Soft, nondistended, positive BS Central nervous system: CN2-12 grossly intact, strength intact Extremities: Perfused, no clubbing Skin: Normal skin turgor, no notable skin lesions seen Psychiatry: Difficult to assess given mentation  Data Reviewed: I have personally reviewed following labs and imaging studies  CBC: Recent Labs  Lab 12/27/21 0035 12/28/21 0449 12/29/21 0513 12/30/21 0523 12/31/21 0449 01/01/22 0459  WBC 8.1 12.9* 11.7* 8.1 11.7* 12.2*  NEUTROABS 6.2 11.1* 10.3* 7.0 9.5*  --   HGB 10.8* 11.8* 10.9* 11.2* 11.9* 11.6*  HCT 33.7* 37.0* 34.1* 35.1* 36.6* 35.8*  MCV 108.7* 108.8* 107.2* 106.7* 107.0* 107.8*  PLT 121* 143* 132* 162 191 161   Basic Metabolic Panel: Recent Labs  Lab 12/28/21 0449 12/29/21 0513 12/30/21 0523 12/31/21 0449 01/01/22 0459 01/02/22 0449  NA 135 137 137 137 136 139  K 4.6 5.0 4.8 4.5 4.7 3.9  CL 106 106 104 103 102 101  CO2 23 25 27 30 30  32  GLUCOSE 208* 242* 314* 227* 326* 205*  BUN 27* 33* 35* 34* 38* 31*  CREATININE 1.21 1.15 1.17 1.18 1.30* 1.21  CALCIUM 8.6* 8.9 9.2 9.1 9.0 9.0  MG 2.5* 2.4 2.6* 2.5* 2.4  --    GFR: Estimated Creatinine Clearance: 73.5 mL/min (by C-G formula based on SCr of 1.21 mg/dL). Liver Function Tests: Recent Labs  Lab 12/28/21 0449 12/29/21 0513 12/30/21 0523 12/31/21 0449 01/01/22 0459  AST 45* 38 41 48* 42*  ALT 30 32 48* 70* 81*  ALKPHOS 63 55 60 65 63  BILITOT 0.3 0.5 0.5 0.5 0.6  PROT 7.0 6.4* 6.7 6.6 6.5  ALBUMIN 3.7 3.3* 3.4* 3.4* 3.3*    No results for input(s): LIPASE, AMYLASE in the last 168 hours. No results for input(s): AMMONIA in the last 168 hours. Coagulation Profile: No results for input(s): INR, PROTIME in the last 168 hours. Cardiac Enzymes: No results for input(s): CKTOTAL, CKMB, CKMBINDEX, TROPONINI in the last 168 hours. BNP (last 3 results) Recent Labs    11/13/21 1246  PROBNP 73.0   HbA1C: No results for input(s): HGBA1C in the last 72 hours. CBG: Recent Labs  Lab 01/01/22 2341 01/02/22 0437 01/02/22 0809 01/02/22 1135 01/02/22 1641  GLUCAP 305* 198* 161* 276* 271*   Lipid Profile: No results for input(s): CHOL, HDL, LDLCALC, TRIG,  CHOLHDL, LDLDIRECT in the last 72 hours. Thyroid Function Tests: No results for input(s): TSH, T4TOTAL, FREET4, T3FREE, THYROIDAB in the last 72 hours. Anemia Panel: Recent Labs    12/31/21 0449  FERRITIN 822*   Sepsis Labs: No results for input(s): PROCALCITON, LATICACIDVEN in the last 168 hours.  Recent Results (from the past 240 hour(s))  Resp Panel by RT-PCR (Flu A&B, Covid) Nasopharyngeal Swab     Status: Abnormal   Collection Time: 12/24/21  5:00 PM   Specimen: Nasopharyngeal Swab; Nasopharyngeal(NP) swabs in vial transport medium  Result Value Ref Range Status   SARS Coronavirus 2 by RT PCR POSITIVE (A) NEGATIVE Final    Comment: (NOTE) SARS-CoV-2 target nucleic acids are DETECTED.  The SARS-CoV-2 RNA is generally detectable in upper respiratory specimens during the acute phase of infection. Positive results are indicative of the presence of the identified virus, but do not rule out bacterial infection or co-infection with other pathogens not detected by the test. Clinical correlation with patient history and other diagnostic information is necessary to determine patient infection status. The expected result is Negative.  Fact Sheet for Patients: EntrepreneurPulse.com.au  Fact Sheet for Healthcare  Providers: IncredibleEmployment.be  This test is not yet approved or cleared by the [place] FDA and  has been authorized for detection and/or diagnosis of SARS-CoV-2 by FDA under an Emergency Use Authorization (EUA).  This EUA will remain in effect (meaning this test can be used) for the duration of  the COVID-19 declaration under Section 564(b)(1) of the A ct, 21 U.S.C. section 360bbb-3(b)(1), unless the authorization is terminated or revoked sooner.     Influenza A by PCR NEGATIVE NEGATIVE Final   Influenza B by PCR NEGATIVE NEGATIVE Final    Comment: (NOTE) The Xpert Xpress SARS-CoV-2/FLU/RSV plus assay is intended as an aid in the diagnosis of influenza from Nasopharyngeal swab specimens and should not be used as a sole basis for treatment. Nasal washings and aspirates are unacceptable for Xpert Xpress SARS-CoV-2/FLU/RSV testing.  Fact Sheet for Patients: EntrepreneurPulse.com.au  Fact Sheet for Healthcare Providers: IncredibleEmployment.be  This test is not yet approved or cleared by the [place] FDA and has been authorized for detection and/or diagnosis of SARS-CoV-2 by FDA under an Emergency Use Authorization (EUA). This EUA will remain in effect (meaning this test can be used) for the duration of the COVID-19 declaration under Section 564(b)(1) of the Act, 21 U.S.C. section 360bbb-3(b)(1), unless the authorization is terminated or revoked.  Performed at [facility], [place] [address], [place], [place] [address]      Radiology Studies: CT Angio Chest Pulmonary Embolism (PE) W or WO Contrast  Result Date: 12/31/2021 CLINICAL DATA:  Shortness of breath, elevated D-dimer, COVID-19 positive EXAM: CT ANGIOGRAPHY CHEST WITH CONTRAST TECHNIQUE: Multidetector CT imaging of the chest was performed using the standard protocol during bolus administration of intravenous contrast. Multiplanar CT image  reconstructions and MIPs were obtained to evaluate the vascular anatomy. RADIATION DOSE REDUCTION: This exam was performed according to the departmental dose-optimization program which includes automated exposure control, adjustment of the mA and/or kV according to patient size and/or use of iterative reconstruction technique. CONTRAST:  7mL OMNIPAQUE IOHEXOL 350 MG/ML SOLN COMPARISON:  X-ray 12/28/2021, CT 09/24/2021 FINDINGS: Cardiovascular: Satisfactory opacification of the pulmonary arteries to the segmental level. No evidence of pulmonary embolism. Thoracic aorta is nonaneurysmal. Scattered atherosclerotic calcifications of the aorta and coronary arteries. Mild cardiomegaly. No pericardial effusion. Mediastinum/Nodes: No enlarged mediastinal, hilar, or axillary lymph nodes.  Thyroid gland, trachea, and esophagus demonstrate no significant findings. Lungs/Pleura: Postsurgical changes from prior right middle lobe and right lower lobe lobectomies. Interval development of multifocal tree-in-bud nodularity and subtle ground-glass opacity within the inferior aspect of the right upper lobe. Right upper lobe noncalcified pulmonary nodule appears slightly enlarged now measuring 9 mm (previously measured 7 mm), on series 6, image 73. Previously described area of nodularity within the lingula has decreased in size compared to the previous study now measuring 8 x 5 mm (previously 13 x 7 mm), on series 6, image 75. No pleural effusion or pneumothorax. Upper Abdomen: Stable subcentimeter low-density lesion at the right hepatic dome. No new or acute findings within the included upper abdomen. Musculoskeletal: Unchanged compression fractures of T3 and T5. No new fracture. Review of the MIP images confirms the above findings. IMPRESSION: 1. No evidence of acute pulmonary embolism. 2. Interval development of multifocal tree-in-bud nodularity and subtle ground-glass opacity within the inferior aspect of the right upper lobe,  suggestive of an infectious or inflammatory bronchiolitis. 3. Slight interval increase in size of a noncalcified pulmonary nodule within the right upper lobe now measuring 9 mm (previously measured 7 mm). Primary neoplasm or metastatic disease are both considerations. Short-term follow-up with CT or PET-CT is recommended. 4. Previously described area of nodularity within the lingula has decreased in size compared to the previous study, attention on follow-up. 5. Unchanged compression fractures of T3 and T5. Aortic Atherosclerosis (ICD10-I70.0). Electronically Signed   By: Davina Poke D.O.   On: 12/31/2021 18:25    Scheduled Meds:  baclofen  20 mg Oral BID   clonazePAM  0.5 mg Oral BID   cyanocobalamin  1,000 mcg Subcutaneous Daily   folic acid  1 mg Oral Daily   heparin  5,000 Units Subcutaneous Q8H   insulin aspart  0-20 Units Subcutaneous TID WC   insulin aspart  0-5 Units Subcutaneous QHS   insulin aspart  6 Units Subcutaneous TID WC   insulin detemir  14 Units Subcutaneous BID   ipratropium-albuterol  3 mL Nebulization BID   mouth rinse  15 mL Mouth Rinse BID   metoprolol succinate  25 mg Oral QHS   multivitamin with minerals  1 tablet Oral Daily   nicotine  21 mg Transdermal Daily   predniSONE  20 mg Oral Q breakfast   QUEtiapine  25 mg Oral QHS   sodium chloride flush  3 mL Intravenous Q12H   thiamine  100 mg Oral Daily   Continuous Infusions:  sodium chloride       LOS: 7 days   Marylu Lund, MD [facility] Pager On Amion  If 7PM-7AM, please contact night-coverage 01/02/2022, 4:47 PM

## 2022-01-03 LAB — COMPREHENSIVE METABOLIC PANEL
ALT: 48 U/L — ABNORMAL HIGH (ref 0–44)
AST: 20 U/L (ref 15–41)
Albumin: 3 g/dL — ABNORMAL LOW (ref 3.5–5.0)
Alkaline Phosphatase: 60 U/L (ref 38–126)
Anion gap: 7 (ref 5–15)
BUN: 28 mg/dL — ABNORMAL HIGH (ref 8–23)
CO2: 31 mmol/L (ref 22–32)
Calcium: 8.6 mg/dL — ABNORMAL LOW (ref 8.9–10.3)
Chloride: 100 mmol/L (ref 98–111)
Creatinine, Ser: 1.02 mg/dL (ref 0.61–1.24)
GFR, Estimated: >60 mL/min (ref >60)
Glucose, Bld: 234 mg/dL — ABNORMAL HIGH (ref 70–99)
Potassium: 4.1 mmol/L (ref 3.5–5.1)
Sodium: 138 mmol/L (ref 135–145)
Total Bilirubin: 0.5 mg/dL (ref 0.3–1.2)
Total Protein: 5.9 g/dL — ABNORMAL LOW (ref 6.5–8.1)

## 2022-01-03 LAB — MISC LABCORP TEST (SEND OUT): Labcorp test code: 505575

## 2022-01-03 LAB — CBC
HCT: 37 % — ABNORMAL LOW (ref 39.0–52.0)
Hemoglobin: 12 g/dL — ABNORMAL LOW (ref 13.0–17.0)
MCH: 35 pg — ABNORMAL HIGH (ref 26.0–34.0)
MCHC: 32.4 g/dL (ref 30.0–36.0)
MCV: 107.9 fL — ABNORMAL HIGH (ref 80.0–100.0)
Platelets: 176 10*3/uL (ref 150–400)
RBC: 3.43 MIL/uL — ABNORMAL LOW (ref 4.22–5.81)
RDW: 14.7 % (ref 11.5–15.5)
WBC: 10.3 10*3/uL (ref 4.0–10.5)
nRBC: 0.9 % — ABNORMAL HIGH (ref 0.0–0.2)

## 2022-01-03 LAB — GLUCOSE, CAPILLARY
Glucose-Capillary: 180 mg/dL — ABNORMAL HIGH (ref 70–99)
Glucose-Capillary: 194 mg/dL — ABNORMAL HIGH (ref 70–99)
Glucose-Capillary: 299 mg/dL — ABNORMAL HIGH (ref 70–99)
Glucose-Capillary: 221 mg/dL — ABNORMAL HIGH (ref 70–99)

## 2022-01-03 MED ORDER — IPRATROPIUM-ALBUTEROL 0.5-2.5 (3) MG/3ML IN SOLN: 3.0000 mL | RESPIRATORY_TRACT | Status: DC | PRN

## 2022-01-03 NOTE — Progress Notes (Signed)
PROGRESS NOTE    Christian [name]  CXK:[id number] DOB: 03/10/1946 DOA: 12/26/2021 PCP: [name], MD    Brief Narrative:  76yo with a history of NSCLC status post RML/LLL lobectomies with lymph node dissection and SBRT to bilateral adrenal glands who presented to the ED 12/26/2021 with progressive shortness of breath.  He had previously been seen in the ER 12/24/2021 at which time he tested positive for COVID.  While in the ED he was noted to have frequent uncontrollable movements, which the family confirmed have been present for approximately 1-2 years but have recently worsened.  Assessment & Plan:   Principal Problem:   COVID-19 virus infection Active Problems:   Anxiety state   Essential hypertension, benign   CAD (coronary artery disease)   OSA on CPAP   Chorea   Chronic kidney disease, stage 3a (HCC)   Elevated troponin   Macrocytic anemia   Obesity, Class II, BMI 35-39.9   Pneumonia due to COVID-19 virus   Demand ischemia (HCC)   DOE (dyspnea on exertion)   Acute respiratory distress  COVID infection - possible COVID-pneumonia - acute hypoxic respiratory failure has completed a full 5-day course of Remdesivir - remains on steroid for 10 day course - CTa suggests possible 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   DM2 uncontrolled with hyperglycemia Made worse with prednisone Now better controlled with insulin    DVT prophylaxis: Heparin subq Code Status: Full Family Communication: Pt in room, family not at bedside  Status is: Inpatient  Remains inpatient appropriate because: Severity of illness   Consultants:  Neurology PCCM  Procedures:    Antimicrobials: Anti-infectives (From admission, onward)    Start     Dose/Rate Route Frequency Ordered Stop   12/27/21 1000  remdesivir 100 mg in sodium chloride 0.9 % 100 mL  IVPB       See Hyperspace for full Linked Orders Report.   100 mg 200 mL/hr over 30 Minutes Intravenous Daily 12/26/21 1254 12/30/21 1033   12/26/21 1400  remdesivir 200 mg in sodium chloride 0.9% 250 mL IVPB       See Hyperspace for full Linked Orders Report.   200 mg 580 mL/hr over 30 Minutes Intravenous Once 12/26/21 1254 12/26/21 1600       Subjective: States feeling better  Objective: Vitals:   01/02/22 1137 01/02/22 2023 01/03/22 0552 01/03/22 1251  BP: 111/86 (!) 150/68  (!) 147/74 (!) 154/86  Pulse: 68 74 (!) 57 75  Resp: 20 18 20 14   Temp: 98.5 F (36.9 C) 98.5 F (36.9 C) 98.6 F (37 C) 98.5 F (36.9 C)  TempSrc: Oral Oral Oral Oral  SpO2: 96% 96% 97% 97%  Weight:      Height:        Intake/Output Summary (Last 24 hours) at 01/03/2022 1716 Last data filed at 01/03/2022 1255 Gross per 24 hour  Intake 880 ml  Output 300 ml  Net 580 ml    Filed Weights   12/26/21 0934  Weight: 122.5 kg    Examination: General exam: Conversant, in no acute distress Respiratory system: normal chest rise, clear, no audible wheezing Cardiovascular system: regular rhythm, s1-s2 Gastrointestinal system: Nondistended, nontender, pos BS Central nervous system: No seizures, no tremors Extremities: No cyanosis, no joint deformities Skin: No rashes, no pallor Psychiatry: Affect normal // no auditory hallucinations   Data Reviewed: I have personally reviewed following labs and imaging studies  CBC: Recent Labs  Lab 12/28/21 0449 12/29/21 0513 12/30/21 0523 12/31/21 0449 01/01/22 0459 01/03/22 0451  WBC 12.9* 11.7* 8.1 11.7* 12.2* 10.3  NEUTROABS 11.1* 10.3* 7.0 9.5*  --   --   HGB 11.8* 10.9* 11.2* 11.9* 11.6* 12.0*  HCT 37.0* 34.1* 35.1* 36.6* 35.8* 37.0*  MCV 108.8* 107.2* 106.7* 107.0* 107.8* 107.9*  PLT 143* 132* 162 191 205 297    Basic Metabolic Panel: Recent Labs  Lab 12/28/21 0449 12/29/21 0513 12/30/21 0523 12/31/21 0449 01/01/22 0459 01/02/22 0449 01/03/22 0451  NA 135 137 137 137 136 139 138  K 4.6 5.0 4.8 4.5 4.7 3.9 4.1  CL 106 106 104 103 102 101 100  CO2 23 25 27 30 30  32 31  GLUCOSE 208* 242* 314* 227* 326* 205* 234*  BUN 27* 33* 35* 34* 38* 31* 28*  CREATININE 1.21 1.15 1.17 1.18 1.30* 1.21 1.02  CALCIUM 8.6* 8.9 9.2 9.1 9.0 9.0 8.6*  MG 2.5* 2.4 2.6* 2.5* 2.4  --   --     GFR: Estimated Creatinine Clearance: 87.2 mL/min (by C-G formula based on SCr of 1.02 mg/dL). Liver Function Tests: Recent Labs  Lab  12/29/21 0513 12/30/21 0523 12/31/21 0449 01/01/22 0459 01/03/22 0451  AST 38 41 48* 42* 20  ALT 32 48* 70* 81* 48*  ALKPHOS 55 60 65 63 60  BILITOT 0.5 0.5 0.5 0.6 0.5  PROT 6.4* 6.7 6.6 6.5 5.9*  ALBUMIN 3.3* 3.4* 3.4* 3.3* 3.0*    No results for input(s): LIPASE, AMYLASE in the last 168 hours. No results for input(s): AMMONIA in the last 168 hours. Coagulation Profile: No results for input(s): INR, PROTIME in the last 168 hours. Cardiac Enzymes: No results for input(s): CKTOTAL, CKMB, CKMBINDEX, TROPONINI in the last 168 hours. BNP (last 3 results) Recent Labs    11/13/21 1246  PROBNP 73.0  HbA1C: No results for input(s): HGBA1C in the last 72 hours. CBG: Recent Labs  Lab 01/02/22 1135 01/02/22 1641 01/02/22 2050 01/03/22 0806 01/03/22 1141  GLUCAP 276* 271* 249* 180* 194*    Lipid Profile: No results for input(s): CHOL, HDL, LDLCALC, TRIG, CHOLHDL, LDLDIRECT in the last 72 hours. Thyroid Function Tests: No results for input(s): TSH, T4TOTAL, FREET4, T3FREE, THYROIDAB in the last 72 hours. Anemia Panel: No results for input(s): VITAMINB12, FOLATE, FERRITIN, TIBC, IRON, RETICCTPCT in the last 72 hours.  Sepsis Labs: No results for input(s): PROCALCITON, LATICACIDVEN in the last 168 hours.  No results found for this or any previous visit (from the past 240 hour(s)).    Radiology Studies: No results found.  Scheduled Meds:  baclofen  20 mg Oral BID   clonazePAM  0.5 mg Oral BID   folic acid  1 mg Oral Daily   heparin  5,000 Units Subcutaneous Q8H   insulin aspart  0-20 Units Subcutaneous TID WC   insulin aspart  0-5 Units Subcutaneous QHS   insulin aspart  6 Units Subcutaneous TID WC   insulin detemir  14 Units Subcutaneous BID   mouth rinse  15 mL Mouth Rinse BID   metoprolol succinate  25 mg Oral QHS   multivitamin with minerals  1 tablet Oral Daily   nicotine  21 mg Transdermal Daily   predniSONE  20 mg Oral Q breakfast   QUEtiapine  25 mg  Oral QHS   sodium chloride flush  3 mL Intravenous Q12H   thiamine  100 mg Oral Daily   Continuous Infusions:  sodium chloride       LOS: 8 days   Marylu Lund, MD [facility] Pager On Amion  If 7PM-7AM, please contact night-coverage 01/03/2022, 5:16 PM

## 2022-01-03 NOTE — Care Management Important Message (Signed)
Important Message  Patient Details IM Letter placed in Patients room. Name: Christian [name] MRN: [id number] Date of Birth: 14-Aug-1946   Medicare Important Message Given:  Yes     Kerin Salen 01/03/2022, 1:17 PM

## 2022-01-03 NOTE — Progress Notes (Signed)
° °  Patient Details Name: Christian [name] MRN: [id number] DOB: [birth year]   SATURATION QUALIFICATIONS: (This note is used to comply with regulatory documentation for home oxygen)  Patient Saturations on Room Air at Rest = 95%  Patient Saturations on Room Air while Ambulating = 86%  Patient Saturations on 2 Liters of oxygen while Ambulating = 100%  Please briefly explain why patient needs home oxygen: to improve oxygen saturations above 88% during physical activities such as ambulation   [place] 01/03/2022, 1:52 PM Jannette Spanner PT, DPT Acute Rehabilitation Services Pager: [phone] Office: [phone]

## 2022-01-03 NOTE — Progress Notes (Signed)
No real improvement since admission, however does feel better with oxygen  NAME:  Christian [name], MRN:  [id number], DOB:  28-Nov-1946, LOS: 8 ADMISSION DATE:  12/26/2021, CONSULTATION DATE:  12/30/21 REFERRING MD:  Chesley Noon, CHIEF COMPLAINT:  sob/covid pos   History of Present Illness:   76 yo male quit smoking 3 y PTA maint on ACEi prior to admit with unexplained doe s/p RML/RLL bilobectomies with LN dissection and s/p SBRT to L&R adrenal glands (July 2020 and Feb 20222 respectively) for [facility] lung ca and  presented to the ER with worsening shortness of breath at home.  He has also been had referral to pulmonology to see about possibly being started on home oxygen however has not had any documented hypoxia and had not been seen by pulmonary prior to admit with last  PFTs 12/19/17: FVC 3.60 (75%), FEV1 2.80 (79%), DLCO unc 24.35 (69%).  He was last seen by oncology in October 2022.  He was  evaluated in the ER on 12/24/2021 with similar symptoms and his COVID testing from that time was positive. He was noted to be tachypneic on evaluation but no hypoxia.  He was however placed on oxygen for comfort in the ER.   Baseline says was able to walk 100 y to MB but last did so 6-8 m ago.  Mild dysphagia, mild sensation of pnds but no real cough.  Does have orthopnea but also back has hurt lying flat about the same period of time.  Ace I stopped.   Significant Hospital Events: Including procedures, antibiotic start and stop dates in addition to other pertinent events   1/22 seen by pulm in consult. Felt dyspnea out of proportion to radiographic findings. There was no desaturation. Prior PFTs negative for obstructive disease. Sniff >>> ordered as outpt given concern for possible diaphragmatic dysfxn.  1/26 Seen sitting up in bedside recline  Interim History / Subjective:  States he feels well with no acute complaints Requesting assistance with home oxygen   Objective   Blood pressure (!) 147/74, pulse (!)  57, temperature 98.6 F (37 C), temperature source Oral, resp. rate 20, height 6' (1.829 m), weight 122.5 kg, SpO2 97 %.        Intake/Output Summary (Last 24 hours) at 01/03/2022 1144 Last data filed at 01/03/2022 0900 Gross per 24 hour  Intake 1100 ml  Output 950 ml  Net 150 ml    Filed Weights   12/26/21 0934  Weight: 122.5 kg    Examination: General: Acute on chronically ill appearing elderly male sitting up in bedside recliner, in NAD HEENT: [place]/AT, MM pink/moist, PERRL,  Neuro: Alert and oriented, some underlying confusion  CV: s1s2 regular rate and rhythm, no murmur, rubs, or gallops,  PULM:  Slightly diminished bilaterally, no increased work of breathing, on 3L [place]  GI: soft, bowel sounds active in all 4 quadrants, non-tender, non-distended, tolerating oral diet  Extremities: warm/dry, no edema  Skin: no rashes or lesions  Assessment & Plan:   Covid Acute on chronic sob disproportionate to gas exchange or infiltrates s/p RML and lower lobe bilobectomies obesity with no insp movement of abdomen w/ 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 obesity, Several underlying co-morbids: prior RML and bilateral Lower lobe Bilobectomy for  adenocarcinoma (2019) and s/p XRT to  left lingula for nodule, now acutely worse w/ RLL pna which could be from COVID (he's covid positive) OR aspiration. I agree w/ Dr Melvyn Novas that he has no diaphragmatic effort in spite of his perceived dyspnea raising concern for diaphragmatic dysfxn   Plan Continue supplemental oxygen for sat goal > 90 Ambulating oxygen saturation to determine need for oxygen at discharge  Mobilize as able  Follow up labs per primary  Continue COVID treatment  Would benefit from pulmonary follow at discharge if patient will allow, He  was not interested in assistance with follow up today   PCCM will sign off. Thank you for the opportunity to participate in this patient's care. Please contact if we can be of further assistance.   Best practice  Per primary    [employer] Kenton Kingfisher, NP-C [facility] Personal contact information can be found on Amion  01/03/2022, 11:45 AM

## 2022-01-03 NOTE — TOC Transition Note (Signed)
Transition of Care [facility]) - CM/SW Discharge Note   Patient Details  Name: Christian [name] MRN: [id number] Date of Birth: 08-14-1946  Transition of Care [facility]) CM/SW Contact:  Trish Mage, LCSW Phone Number: 01/03/2022, 4:03 PM   Clinical Narrative:   Patient who is stable for d/c is in need of [place] PT services, DME home O2.  Patient is COVID positive and did not answer his phone.  I called son, who confirmed wish for [facility] services and home O2.  Son stated he went over to the home, where his father lives alone, and found a walker that will suffice unless found to be inadequate by [facility] PT.  Son also gave me a cell phone number for patient to give to [place].  I contacted Cindie with [facility] who agreed to provide [facility] services.  I contacted Danielle with [facility] who agreed to provide home O2, both home unit and travel cannister to be delivered to patient's room. TOC will continue to follow during the course of hospitalization.     Final next level of care: [place] Barriers to Discharge: No Barriers Identified   Patient Goals and CMS Choice        Discharge Placement                       Discharge Plan and Services                                     Social Determinants of Health (SDOH) Interventions     Readmission Risk Interventions No flowsheet data found.

## 2022-01-03 NOTE — Evaluation (Signed)
Physical Therapy Evaluation Patient Details Name: Christian [name] MRN: [id number] DOB: 07/31/1946 Today's Date: 01/03/2022  History of Present Illness  76yo with a history of NSCLC status post RML/LLL lobectomies with lymph node dissection and SBRT to bilateral adrenal glands who presented to the ED 12/26/2021 with progressive shortness of breath.  He had previously been seen in the ER 12/24/2021 at which time he tested positive for COVID.  While in the ED he was noted to have frequent uncontrollable movements, which the family confirmed have been present for approximately 1-2 years but have recently worsened.  Clinical Impression  Pt admitted with above diagnosis.  Pt currently with functional limitations due to the deficits listed below (see PT Problem List). Pt will benefit from skilled PT to increase their independence and safety with mobility to allow discharge to the venue listed below.   Pt requesting oxygen however 95% on room air at rest.  Therapist explained oxygen saturation screening for use of home oxygen and pt agreeable.  Pt did have drop in saturations to 86% room air upon return to room so oxygen reapplied.  Pt would also benefit from HHPT and RW upon d/c.      Recommendations for follow up therapy are one component of a multi-disciplinary discharge planning process, led by the attending physician.  Recommendations may be updated based on patient status, additional functional criteria and insurance authorization.  Follow Up Recommendations Home health PT    Assistance Recommended at Discharge Intermittent Supervision/Assistance  Patient can return home with the following       Equipment Recommendations Rolling walker (2 wheels)  Recommendations for Other Services       Functional Status Assessment Patient has had a recent decline in their functional status and demonstrates the ability to make significant improvements in function in a reasonable and predictable amount of time.      Precautions / Restrictions Precautions Precautions: Fall Precaution Comments: monitor sats Restrictions Weight Bearing Restrictions: No      Mobility  Bed Mobility               General bed mobility comments: pt in recliner    Transfers Overall transfer level: Needs assistance Equipment used: Rolling walker (2 wheels) Transfers: Sit to/from Stand Sit to Stand: Supervision                Ambulation/Gait Ambulation/Gait assistance: Supervision, Min guard Gait Distance (Feet): 120 Feet Assistive device: Rolling walker (2 wheels) Gait Pattern/deviations: Trunk flexed, Step-to pattern, Decreased stance time - left, Knee hyperextension - left       General Gait Details: pt denies any LE issues despite deviations above; pt required oxygen near end of ambulation so 2L applied (see saturations progress note)  Stairs            Wheelchair Mobility    Modified Rankin (Stroke Patients Only)       Balance Overall balance assessment: Needs assistance         Standing balance support: Bilateral upper extremity supported, Reliant on assistive device for balance Standing balance-Leahy Scale: Poor                               Pertinent Vitals/Pain Pain Assessment Pain Assessment: No/denies pain    Home Living Family/patient expects to be discharged to:: Private residence Living Arrangements: Alone Available Help at Discharge: Family;Available PRN/intermittently Type of Home: House Home Access: Stairs to enter Entrance Stairs-Rails:  Right;Left Entrance Stairs-Number of Steps: 6   Home Layout: Multi-level;Able to live on main level with bedroom/bathroom (has a basement he goes down to) Home Equipment: Shower seat;Cane - single point Additional Comments: son is looking for a walker    Prior Function Prior Level of Function : Independent/Modified Independent             Mobility Comments: reports not using a device ADLs  Comments: independent     Hand Dominance   Dominant Hand: Right    Extremity/Trunk Assessment        Lower Extremity Assessment Lower Extremity Assessment: Generalized weakness;LLE deficits/detail LLE Deficits / Details: observed some Lt LE deficits during ambulation however pt denies any issues    Cervical / Trunk Assessment Cervical / Trunk Assessment: Kyphotic  Communication   Communication: No difficulties  Cognition Arousal/Alertness: Awake/alert Behavior During Therapy: WFL for tasks assessed/performed Overall Cognitive Status: Within Functional Limits for tasks assessed                                          General Comments      Exercises     Assessment/Plan    PT Assessment Patient needs continued PT services  PT Problem List Decreased strength;Decreased activity tolerance;Decreased mobility;Decreased knowledge of precautions;Cardiopulmonary status limiting activity;Decreased balance       PT Treatment Interventions DME instruction;Therapeutic exercise;Stair training;Functional mobility training;Therapeutic activities;Patient/family education;Gait training;Balance training    PT Goals (Current goals can be found in the Care Plan section)  Acute Rehab PT Goals PT Goal Formulation: With patient Time For Goal Achievement: 01/10/22 Potential to Achieve Goals: Good    Frequency Min 3X/week     Co-evaluation               AM-PAC PT "6 Clicks" Mobility  Outcome Measure Help needed turning from your back to your side while in a flat bed without using bedrails?: A Little Help needed moving from lying on your back to sitting on the side of a flat bed without using bedrails?: A Little Help needed moving to and from a bed to a chair (including a wheelchair)?: A Little Help needed standing up from a chair using your arms (e.g., wheelchair or bedside chair)?: A Little Help needed to walk in hospital room?: A Little Help needed climbing  3-5 steps with a railing? : A Little 6 Click Score: 18    End of Session Equipment Utilized During Treatment: Gait belt;Oxygen Activity Tolerance: Patient tolerated treatment well Patient left: with call bell/phone within reach;in chair;with chair alarm set Nurse Communication: Mobility status PT Visit Diagnosis: Difficulty in walking, not elsewhere classified (R26.2)    Time: 9201-0071 PT Time Calculation (min) (ACUTE ONLY): 19 min   Charges:   PT Evaluation $PT Eval Low Complexity: 1 Low         Kati PT, DPT Acute Rehabilitation Services Pager: [phone] Office: [place] 01/03/2022, 2:44 PM

## 2022-01-03 NOTE — Evaluation (Signed)
Occupational Therapy Evaluation Patient Details Name: Christian [name] MRN: [id number] DOB: 17-Aug-1946 Today's Date: 01/03/2022   History of Present Illness 76yo with a history of NSCLC status post RML/LLL lobectomies with lymph node dissection and SBRT to bilateral adrenal glands who presented to the ED 12/26/2021 with progressive shortness of breath.  He had previously been seen in the ER 12/24/2021 at which time he tested positive for COVID.  While in the ED he was noted to have frequent uncontrollable movements, which the family confirmed have been present for approximately 1-2 years but have recently worsened.   Clinical Impression   Christian [name] is a 76 year old man who presents with above medical history and presents on 3 L [place]. At baseline patient lives alone and is independent including driving and grocery shopping. On evaluation he demonstrates generalized weakness, decreased activity tolerance and impaired balance compared to his baseline. He was supervision to transfer to side of bed and don socks, and min guard for ambulation in room with RW. He was able to perform toilet transfer with verbal cue and perform toileting and standing grooming with supervision. Throughout treatment patient breathing heavy with gasping noises from his mouth though his o2 sats 94-95%. He reports feeling short of breathe and is more comfortable on oxygen. Expect patient to recover well while in hospital and not have any OT needs at discharge. Discussed with patient that therapist would like to have someone with him the first night or two at home and he said maybe his daughter would. Will follow acutely to advance patient to modified independence.     Recommendations for follow up therapy are one component of a multi-disciplinary discharge planning process, led by the attending physician.  Recommendations may be updated based on patient status, additional functional criteria and insurance authorization.   Follow  Up Recommendations  No OT follow up    Assistance Recommended at Discharge Intermittent Supervision/Assistance  Patient can return home with the following Assistance with cooking/housework;Help with stairs or ramp for entrance    Functional Status Assessment  Patient has had a recent decline in their functional status and demonstrates the ability to make significant improvements in function in a reasonable and predictable amount of time.  Equipment Recommendations  None recommended by OT    Recommendations for Other Services       Precautions / Restrictions Precautions Precautions: Fall Restrictions Weight Bearing Restrictions: No      Mobility Bed Mobility                    Transfers                          Balance Overall balance assessment: Mild deficits observed, not formally tested                                         ADL either performed or assessed with clinical judgement   ADL Overall ADL's : Needs assistance/impaired Eating/Feeding: Independent   Grooming: Supervision/safety;Standing;Wash/dry hands;Wash/dry face Grooming Details (indicate cue type and reason): at sink Upper Body Bathing: Set up;Sitting   Lower Body Bathing: Supervison/ safety;Sit to/from stand;Set up   Upper Body Dressing : Set up;Sitting   Lower Body Dressing: Min guard;Sit to/from stand Lower Body Dressing Details (indicate cue type and reason): able to don socks, demonstrates ability to stand  and manage clothing Toilet Transfer: Min guard;Regular Toilet;Grab bars;Rolling walker (2 wheels)   Toileting- Clothing Manipulation and Hygiene: Supervision/safety;Sitting/lateral lean Toileting - Clothing Manipulation Details (indicate cue type and reason): able to manage self cleaning seated on toilet     Functional mobility during ADLs: Min guard;Rolling walker (2 wheels) General ADL Comments: Overall min guard for ambulation in room to bathroom and  back. Supervision to perform toileting (extended amount of time) and perform wiping. Able to stand at sink to wash hands. Patient also able to don socks at edge of bed. Patient exhibits gasping mouth breathing and reports feeling short of breathe. However, o2 sats 94-95% on RA. He feels more comfortable on O2.     Vision Patient Visual Report: No change from baseline       Perception     Praxis      Pertinent Vitals/Pain Pain Assessment Pain Assessment: No/denies pain     Hand Dominance     Extremity/Trunk Assessment             Communication     Cognition Arousal/Alertness: Awake/alert Behavior During Therapy: WFL for tasks assessed/performed Overall Cognitive Status: Within Functional Limits for tasks assessed                                       General Comments       Exercises     Shoulder Instructions      Home Living                                          Prior Functioning/Environment                          OT Problem List: Decreased activity tolerance;Impaired balance (sitting and/or standing);Decreased knowledge of use of DME or AE      OT Treatment/Interventions: Self-care/ADL training;DME and/or AE instruction;Therapeutic activities;Balance training;Patient/family education    OT Goals(Current goals can be found in the care plan section) Acute Rehab OT Goals Patient Stated Goal: go to bathroom by himself OT Goal Formulation: With patient Time For Goal Achievement: 01/17/22 Potential to Achieve Goals: Good  OT Frequency: Min 2X/week    Co-evaluation              AM-PAC OT "6 Clicks" Daily Activity     Outcome Measure Help from another person eating meals?: None Help from another person taking care of personal grooming?: A Little Help from another person toileting, which includes using toliet, bedpan, or urinal?: A Little Help from another person bathing (including washing, rinsing,  drying)?: A Little Help from another person to put on and taking off regular upper body clothing?: A Little Help from another person to put on and taking off regular lower body clothing?: A Little 6 Click Score: 19   End of Session Equipment Utilized During Treatment: Rolling walker (2 wheels);Gait belt Nurse Communication: Mobility status  Activity Tolerance: Patient tolerated treatment well Patient left: in chair;with call bell/phone within reach;with chair alarm set  OT Visit Diagnosis: Muscle weakness (generalized) (M62.81);Unsteadiness on feet (R26.81)                Time: 2426-8341 OT Time Calculation (min): 31 min Charges:  OT General Charges $OT Visit: 1 Visit OT Evaluation $  OT Eval Low Complexity: 1 Low OT Treatments $Self Care/Home Management : 8-22 mins  Danyelle Brookover, OTR/L [place]  Office [phone] Pager: [phone]   Lenward Chancellor 01/03/2022, 1:29 PM

## 2022-01-04 LAB — COMPREHENSIVE METABOLIC PANEL
ALT: 42 U/L (ref 0–44)
AST: 21 U/L (ref 15–41)
Albumin: 3.1 g/dL — ABNORMAL LOW (ref 3.5–5.0)
Alkaline Phosphatase: 57 U/L (ref 38–126)
Anion gap: 5 (ref 5–15)
BUN: 29 mg/dL — ABNORMAL HIGH (ref 8–23)
CO2: 34 mmol/L — ABNORMAL HIGH (ref 22–32)
Calcium: 8.8 mg/dL — ABNORMAL LOW (ref 8.9–10.3)
Chloride: 100 mmol/L (ref 98–111)
Creatinine, Ser: 1.09 mg/dL (ref 0.61–1.24)
GFR, Estimated: >60 mL/min (ref >60)
Glucose, Bld: 199 mg/dL — ABNORMAL HIGH (ref 70–99)
Potassium: 4.3 mmol/L (ref 3.5–5.1)
Sodium: 139 mmol/L (ref 135–145)
Total Bilirubin: 0.5 mg/dL (ref 0.3–1.2)
Total Protein: 6.1 g/dL — ABNORMAL LOW (ref 6.5–8.1)

## 2022-01-04 LAB — CBC
HCT: 37 % — ABNORMAL LOW (ref 39.0–52.0)
Hemoglobin: 11.8 g/dL — ABNORMAL LOW (ref 13.0–17.0)
MCH: 34.3 pg — ABNORMAL HIGH (ref 26.0–34.0)
MCHC: 31.9 g/dL (ref 30.0–36.0)
MCV: 107.6 fL — ABNORMAL HIGH (ref 80.0–100.0)
Platelets: 154 10*3/uL (ref 150–400)
RBC: 3.44 MIL/uL — ABNORMAL LOW (ref 4.22–5.81)
RDW: 15 % (ref 11.5–15.5)
WBC: 8.1 10*3/uL (ref 4.0–10.5)
nRBC: 0.5 % — ABNORMAL HIGH (ref 0.0–0.2)

## 2022-01-04 LAB — GLUCOSE, CAPILLARY
Glucose-Capillary: 181 mg/dL — ABNORMAL HIGH (ref 70–99)
Glucose-Capillary: 253 mg/dL — ABNORMAL HIGH (ref 70–99)

## 2022-01-04 MED ORDER — PREDNISONE 20 MG PO TABS: 20.0000 mg | ORAL_TABLET | Freq: Every day | ORAL | 0 refills | Status: AC

## 2022-01-04 MED ORDER — BENZONATATE 100 MG PO CAPS: 100.0000 mg | ORAL_CAPSULE | Freq: Three times a day (TID) | ORAL | 0 refills | Status: AC | PRN

## 2022-01-04 MED ORDER — METOPROLOL SUCCINATE ER 25 MG PO TB24: 25.0000 mg | ORAL_TABLET | Freq: Every day | ORAL | 0 refills | Status: AC

## 2022-01-04 NOTE — Discharge Summary (Signed)
Physician Discharge Summary  Christian [name]:[id number] DOB: 1945/12/18 DOA: 12/26/2021  PCP: [name], MD  Admit date: 12/26/2021 Discharge date: 01/04/2022  Admitted From: Home Disposition:  Home  Recommendations for Outpatient Follow-up:  Follow up with PCP in 2-3 weeks Per PCCM, recs for outpatient Sniff test  Home Health:PT  Equipment/Devices:home O2    Discharge Condition:Improved CODE STATUS:Full Diet recommendation: Diabetic   Brief/Interim Summary: 75yo with a history of NSCLC status post RML/LLL lobectomies with lymph node dissection and SBRT to bilateral adrenal glands who presented to the ED 12/26/2021 with progressive shortness of breath.  He had previously been seen in the ER 12/24/2021 at which time he tested positive for COVID.  While in the ED he was noted to have frequent uncontrollable movements, which the family confirmed have been present for approximately 1-2 years but have recently worsened.   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  -Weaned to Room air but requires [facility] on ambulation, meets home O2 requirements   Metastatic non-small cell lung cancer status post bilateral lobectomies - enlarging noncalcified pulmonary nodule within the right upper lobe  CTat this admit noted increase in size of this known nodule from 7 > 4mm - serial ouptpt  f/u indicated - care per Pulmonary    Altered mental status - acute delirium Likely due to a combination of steroid use, sundowning, and hyperglycemia -resolved with seroquel QHS in hospital   Persistent dyspnea  Likely multifactorial - ACEi and BB to be avoided per PCCM - CTa negative for PE -sniff test ordered per pulmonary to evaluate for suspected diaphragmatic paralysis -oxygen saturations much improving slowly -Now able to be weaned to RA at rest but on ambulating, did desaturate to 86%, thus will benefit from home O2   Elevated d-dimer  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   DM2 uncontrolled with hyperglycemia Made worse with prednisone    Discharge Instructions   Allergies as of 01/04/2022       Reactions   Celebrex [celecoxib] Itching        Medication List     STOP taking these medications    diphenhydrAMINE 25 mg capsule Commonly known as: BENADRYL  lisinopril 10 MG tablet Commonly known as: ZESTRIL       TAKE these medications    albuterol 108 (90 Base) MCG/ACT inhaler Commonly known as: VENTOLIN HFA Inhale 1-2 puffs into the lungs every 6 (six) hours as needed for wheezing or shortness of breath.   aspirin 325 MG tablet Take 162.5 mg by mouth daily.   baclofen 20 MG tablet Commonly known as: LIORESAL TAKE 1 TABLET (20 MG TOTAL) BY MOUTH 2 (TWO) TIMES DAILY AS NEEDED FOR MUSCLE SPASMS. What  changed:  how much to take when to take this   benzonatate 100 MG capsule Commonly known as: TESSALON Take 1 capsule (100 mg total) by mouth 3 (three) times daily as needed for cough.   clonazePAM 0.5 MG tablet Commonly known as: KLONOPIN Take 1 tablet (0.5 mg total) by mouth at bedtime as needed. For insomnia   Melatonin 10 MG Tabs Take 10 mg by mouth at bedtime.   metFORMIN 500 MG tablet Commonly known as: GLUCOPHAGE TAKE 1 TABLET BY MOUTH EVERY DAY WITH BREAKFAST What changed: See the new instructions.   metoprolol succinate 25 MG 24 hr tablet Commonly known as: TOPROL-XL Take 1 tablet (25 mg total) by mouth at bedtime. What changed:  medication strength how much to take when to take this additional instructions   nitroGLYCERIN 0.4 MG SL tablet Commonly known as: Nitrostat Place 1 tablet (0.4 mg total) under the tongue every 5 (five) minutes as needed for chest pain.   predniSONE 20 MG tablet Commonly known as: DELTASONE Take 1 tablet (20 mg total) by mouth daily with breakfast for 2 days.   simvastatin 40 MG tablet Commonly known as: ZOCOR Take 1 tablet (40 mg total) by mouth at bedtime.   vitamin E 45 MG (100 UNITS) capsule Take 100 Units by mouth daily.               Durable Medical Equipment  (From admission, onward)           Start     Ordered   Unscheduled  For home use only DME oxygen  Once       Question Answer Comment  Length of Need 6 Months   Mode or (Route) Nasal cannula   Liters per Minute 2   Frequency Continuous (stationary and portable oxygen unit needed)   Oxygen delivery system Gas      01/04/22 0940            Follow-up Information     [facility] Follow up.   Specialty: Home Health Services Why: They will contact you about setting up a time to come out. Contact information: [address] [place] [address] [phone]         [facility] [employer] Follow up.   Why: Also known as  [employer], this is your oxygen company Contact information: [address] [place] [address] [phone]         [name], MD Follow up in 2 week(s).   Specialty: Family Medicine Why: Hospital follow up Contact information: [place] [address] [phone]                Allergies  Allergen Reactions   Celebrex [Celecoxib] Itching    Consultations: PCCM  Procedures/Studies: DG Chest 2 View  Result Date: 12/26/2021 CLINICAL DATA:  Shortness of breath EXAM: CHEST - 2 VIEW COMPARISON:  12/24/2021 FINDINGS: Volume loss right hemithorax. No  new consolidation or edema. Chronic blunting right costophrenic angle. Stable cardiomediastinal contours. IMPRESSION: No acute process in the chest. Electronically Signed   By: Macy Mis M.D.   On: 12/26/2021 10:25   DG Chest 2 View  Result Date: 12/24/2021 CLINICAL DATA:  Cough and congestion, shortness of breath for 4 days, fever EXAM: CHEST - 2 VIEW COMPARISON:  07/11/2021, 09/24/2021 FINDINGS: Frontal and lateral views of the chest demonstrate a stable cardiac silhouette. Diffuse increased interstitial prominence consistent with scarring. Volume loss right lung base consistent with prior right lower lobe resection. No acute airspace disease. Chronic right pleural thickening versus trace right effusion. No pneumothorax. The lingular nodule seen on recent CT is not readily apparent on this exam. No acute bony abnormalities. IMPRESSION: 1. Trace right pleural effusion versus chronic pleural thickening. 2. Chronic interstitial scarring without acute airspace disease. 3. The suspicious lingular nodule seen on previous CT not well visualized by x-ray. Electronically Signed   By: Randa Ngo M.D.   On: 12/24/2021 17:51   CT HEAD WO CONTRAST (5MM)  Result Date: 12/27/2021 CLINICAL DATA:  Neuro deficit, stroke suspected, choreiform movements. Evaluate for old basal ganglia stroke. EXAM: CT HEAD  WITHOUT CONTRAST TECHNIQUE: Contiguous axial images were obtained from the base of the skull through the vertex without intravenous contrast. RADIATION DOSE REDUCTION: This exam was performed according to the departmental dose-optimization program which includes automated exposure control, adjustment of the mA and/or kV according to patient size and/or use of iterative reconstruction technique. COMPARISON:  01/01/2018. FINDINGS: Brain: No acute intracranial hemorrhage, midline shift or mass effect. No extra-axial fluid collection. Diffuse atrophy is noted. Subcortical and periventricular white matter hypodensities are present bilaterally. No hydrocephalus. Vascular: Atherosclerotic calcification of the carotid siphons. No hyperdense vessel. Skull: Normal. Negative for fracture or focal lesion. Sinuses/Orbits: Mucosal thickening is present in the ethmoid air cells, maxillary sinuses, and sphenoid sinuses bilaterally and frontal sinus on the left. No acute orbital abnormality. Other: There is opacification of the mastoid air cells and middle ear on the left. IMPRESSION: 1. No acute intracranial process. 2. Atrophy with chronic microvascular ischemic changes. 3. Paranasal sinus disease. 4. Opacification of the mastoid air cells and middle ear on the left, possible mastoiditis versus cholesteatoma. Electronically Signed   By: Brett Fairy M.D.   On: 12/27/2021 22:01   CT Angio Chest Pulmonary Embolism (PE) W or WO Contrast  Result Date: 12/31/2021 CLINICAL DATA:  Shortness of breath, elevated D-dimer, COVID-19 positive EXAM: CT ANGIOGRAPHY CHEST WITH CONTRAST TECHNIQUE: Multidetector CT imaging of the chest was performed using the standard protocol during bolus administration of intravenous contrast. Multiplanar CT image reconstructions and MIPs were obtained to evaluate the vascular anatomy. RADIATION DOSE REDUCTION: This exam was performed according to the departmental dose-optimization program which includes  automated exposure control, adjustment of the mA and/or kV according to patient size and/or use of iterative reconstruction technique. CONTRAST:  58mL OMNIPAQUE IOHEXOL 350 MG/ML SOLN COMPARISON:  X-ray 12/28/2021, CT 09/24/2021 FINDINGS: Cardiovascular: Satisfactory opacification of the pulmonary arteries to the segmental level. No evidence of pulmonary embolism. Thoracic aorta is nonaneurysmal. Scattered atherosclerotic calcifications of the aorta and coronary arteries. Mild cardiomegaly. No pericardial effusion. Mediastinum/Nodes: No enlarged mediastinal, hilar, or axillary lymph nodes. Thyroid gland, trachea, and esophagus demonstrate no significant findings. Lungs/Pleura: Postsurgical changes from prior right middle lobe and right lower lobe lobectomies. Interval development of multifocal tree-in-bud nodularity and subtle ground-glass opacity within the inferior aspect of the right upper lobe. Right upper lobe  noncalcified pulmonary nodule appears slightly enlarged now measuring 9 mm (previously measured 7 mm), on series 6, image 73. Previously described area of nodularity within the lingula has decreased in size compared to the previous study now measuring 8 x 5 mm (previously 13 x 7 mm), on series 6, image 75. No pleural effusion or pneumothorax. Upper Abdomen: Stable subcentimeter low-density lesion at the right hepatic dome. No new or acute findings within the included upper abdomen. Musculoskeletal: Unchanged compression fractures of T3 and T5. No new fracture. Review of the MIP images confirms the above findings. IMPRESSION: 1. No evidence of acute pulmonary embolism. 2. Interval development of multifocal tree-in-bud nodularity and subtle ground-glass opacity within the inferior aspect of the right upper lobe, suggestive of an infectious or inflammatory bronchiolitis. 3. Slight interval increase in size of a noncalcified pulmonary nodule within the right upper lobe now measuring 9 mm (previously measured  7 mm). Primary neoplasm or metastatic disease are both considerations. Short-term follow-up with CT or PET-CT is recommended. 4. Previously described area of nodularity within the lingula has decreased in size compared to the previous study, attention on follow-up. 5. Unchanged compression fractures of T3 and T5. Aortic Atherosclerosis (ICD10-I70.0). Electronically Signed   By: Davina Poke D.O.   On: 12/31/2021 18:25   DG Chest Port 1 View  Result Date: 12/28/2021 CLINICAL DATA:  Acute respiratory distress. EXAM: PORTABLE CHEST 1 VIEW COMPARISON:  12/26/2021 FINDINGS: 0453 hours. Low volume film. The cardio pericardial silhouette is enlarged. There is pulmonary vascular congestion without overt pulmonary edema. Airspace disease at the right base is suspicious for pneumonia in there is a tiny right pleural effusion. Bones are diffusely demineralized. IMPRESSION: Low volume film with right base pneumonia and tiny right pleural effusion. Electronically Signed   By: Misty Stanley M.D.   On: 12/28/2021 17:28    Subjective: Eager to go home  Discharge Exam: Vitals:   01/03/22 2034 01/04/22 0414  BP: (!) 144/66 (!) 171/86  Pulse: 77 65  Resp: (!) 24 20  Temp: 97.8 F (36.6 C) 97.9 F (36.6 C)  SpO2: 97% 99%   Vitals:   01/03/22 1251 01/03/22 1748 01/03/22 2034 01/04/22 0414  BP: (!) 154/86  (!) 144/66 (!) 171/86  Pulse: 75  77 65  Resp: 14  (!) 24 20  Temp: 98.5 F (36.9 C)  97.8 F (36.6 C) 97.9 F (36.6 C)  TempSrc: Oral  Oral Oral  SpO2: 97% 94% 97% 99%  Weight:      Height:        General: Pt is alert, awake, not in acute distress Cardiovascular: RRR, S1/S2  Respiratory: CTA bilaterally, no wheezing, no rhonchi Abdominal: Soft, NT, ND, bowel sounds + Extremities: no edema, no cyanosis   The results of significant diagnostics from this hospitalization (including imaging, microbiology, ancillary and laboratory) are listed below for reference.     Microbiology: No results  found for this or any previous visit (from the past 240 hour(s)).   Labs: BNP (last 3 results) Recent Labs    07/11/21 1151 12/26/21 1046  BNP 60.4 27.7   Basic Metabolic Panel: Recent Labs  Lab 12/29/21 0513 12/30/21 0523 12/31/21 0449 01/01/22 0459 01/02/22 0449 01/03/22 0451 01/04/22 0502  NA 137 137 137 136 139 138 139  K 5.0 4.8 4.5 4.7 3.9 4.1 4.3  CL 106 104 103 102 101 100 100  CO2 25 27 30 30  32 31 34*  GLUCOSE 242* 314* 227* 326* 205* 234* 199*  BUN 33* 35* 34* 38* 31* 28* 29*  CREATININE 1.15 1.17 1.18 1.30* 1.21 1.02 1.09  CALCIUM 8.9 9.2 9.1 9.0 9.0 8.6* 8.8*  MG 2.4 2.6* 2.5* 2.4  --   --   --    Liver Function Tests: Recent Labs  Lab 12/30/21 0523 12/31/21 0449 01/01/22 0459 01/03/22 0451 01/04/22 0502  AST 41 48* 42* 20 21  ALT 48* 70* 81* 48* 42  ALKPHOS 60 65 63 60 57  BILITOT 0.5 0.5 0.6 0.5 0.5  PROT 6.7 6.6 6.5 5.9* 6.1*  ALBUMIN 3.4* 3.4* 3.3* 3.0* 3.1*   No results for input(s): LIPASE, AMYLASE in the last 168 hours. No results for input(s): AMMONIA in the last 168 hours. CBC: Recent Labs  Lab 12/29/21 0513 12/30/21 0523 12/31/21 0449 01/01/22 0459 01/03/22 0451 01/04/22 0502  WBC 11.7* 8.1 11.7* 12.2* 10.3 8.1  NEUTROABS 10.3* 7.0 9.5*  --   --   --   HGB 10.9* 11.2* 11.9* 11.6* 12.0* 11.8*  HCT 34.1* 35.1* 36.6* 35.8* 37.0* 37.0*  MCV 107.2* 106.7* 107.0* 107.8* 107.9* 107.6*  PLT 132* 162 191 205 176 154   Cardiac Enzymes: No results for input(s): CKTOTAL, CKMB, CKMBINDEX, TROPONINI in the last 168 hours. BNP: Invalid input(s): POCBNP CBG: Recent Labs  Lab 01/03/22 0806 01/03/22 1141 01/03/22 1720 01/03/22 2037 01/04/22 0747  GLUCAP 180* 194* 299* 221* 181*   D-Dimer No results for input(s): DDIMER in the last 72 hours. Hgb A1c No results for input(s): HGBA1C in the last 72 hours. Lipid Profile No results for input(s): CHOL, HDL, LDLCALC, TRIG, CHOLHDL, LDLDIRECT in the last 72 hours. Thyroid function  studies No results for input(s): TSH, T4TOTAL, T3FREE, THYROIDAB in the last 72 hours.  Invalid input(s): FREET3 Anemia work up No results for input(s): VITAMINB12, FOLATE, FERRITIN, TIBC, IRON, RETICCTPCT in the last 72 hours. Urinalysis    Component Value Date/Time   COLORURINE YELLOW 01/14/2018 1153   APPEARANCEUR CLEAR 01/14/2018 1153   LABSPEC 1.018 01/14/2018 1153   PHURINE 6.0 01/14/2018 1153   GLUCOSEU NEGATIVE 01/14/2018 1153   HGBUR NEGATIVE 01/14/2018 1153   BILIRUBINUR NEGATIVE 01/14/2018 1153   BILIRUBINUR NEG 06/02/2013 1454   KETONESUR NEGATIVE 01/14/2018 1153   PROTEINUR NEGATIVE 01/14/2018 1153   UROBILINOGEN 0.2 06/02/2013 1454   NITRITE NEGATIVE 01/14/2018 1153   LEUKOCYTESUR NEGATIVE 01/14/2018 1153   Sepsis Labs Invalid input(s): PROCALCITONIN,  WBC,  LACTICIDVEN Microbiology No results found for this or any previous visit (from the past 240 hour(s)).  Time spent: 30 min  SIGNED:   Marylu Lund, MD  [facility] 01/04/2022, 9:40 AM  If 7PM-7AM, please contact night-coverage

## 2022-01-04 NOTE — Progress Notes (Signed)
Inpatient Diabetes Program Recommendations  AACE/ADA: New Consensus Statement on Inpatient Glycemic Control (2015)  Target Ranges:  Prepandial:   less than 140 mg/dL      Peak postprandial:   less than 180 mg/dL (1-2 hours)      Critically ill patients:  140 - 180 mg/dL   Lab Results  Component Value Date   GLUCAP 181 (H) 01/04/2022   HGBA1C 9.7 (H) 11/13/2021    Review of Glycemic Control  Latest Reference Range & Units 01/03/22 08:06 01/03/22 11:41 01/03/22 17:20 01/03/22 20:37 01/04/22 07:47  Glucose-Capillary 70 - 99 mg/dL 180 (H) 194 (H) 299 (H) 221 (H) 181 (H)  (H): Data is abnormally high   Current orders for Inpatient glycemic control: Levemir 14 units BID, Novolog 0-20 units TID & 0-5 QHS, Novolog 6 units TID with meals   Inpatient Diabetes Program Recommendations:     Please increase meal coverage to Novolog 8 units TID if eats at least 50%  Will continue to follow while inpatient.  Thank you, [name], MSN, RN Diabetes Coordinator Inpatient Diabetes Program [phone] (team pager from 8a-5p)

## 2022-01-04 NOTE — Plan of Care (Signed)
No acute events overnight. Problem: Education: Goal: Knowledge of General Education information will improve Description: Including pain rating scale, medication(s)/side effects and non-pharmacologic comfort measures Outcome: Progressing   Problem: Health Behavior/Discharge Planning: Goal: Ability to manage health-related needs will improve Outcome: Progressing   Problem: Clinical Measurements: Goal: Ability to maintain clinical measurements within normal limits will improve Outcome: Progressing Goal: Will remain free from infection Outcome: Progressing Goal: Diagnostic test results will improve Outcome: Progressing Goal: Respiratory complications will improve Outcome: Progressing Goal: Cardiovascular complication will be avoided Outcome: Progressing   Problem: Activity: Goal: Risk for activity intolerance will decrease Outcome: Progressing   Problem: Nutrition: Goal: Adequate nutrition will be maintained Outcome: Progressing   Problem: Coping: Goal: Level of anxiety will decrease Outcome: Progressing   Problem: Elimination: Goal: Will not experience complications related to bowel motility Outcome: Progressing Goal: Will not experience complications related to urinary retention Outcome: Progressing   Problem: Pain Managment: Goal: General experience of comfort will improve Outcome: Progressing   Problem: Safety: Goal: Ability to remain free from injury will improve Outcome: Progressing   Problem: Skin Integrity: Goal: Risk for impaired skin integrity will decrease Outcome: Progressing   Problem: Education: Goal: Knowledge of risk factors and measures for prevention of condition will improve Outcome: Progressing   Problem: Respiratory: Goal: Will maintain a patent airway Outcome: Progressing Goal: Complications related to the disease process, condition or treatment will be avoided or minimized Outcome: Progressing

## 2022-01-05 DIAGNOSIS — R0603 Acute respiratory distress: Secondary | ICD-10-CM | POA: Diagnosis not present

## 2022-01-05 DIAGNOSIS — U071 COVID-19: Secondary | ICD-10-CM | POA: Diagnosis not present

## 2022-01-05 DIAGNOSIS — R0609 Other forms of dyspnea: Secondary | ICD-10-CM | POA: Diagnosis not present

## 2022-01-07 ENCOUNTER — Telehealth: Payer: Self-pay

## 2022-01-07 DIAGNOSIS — Z09 Encounter for follow-up examination after completed treatment for conditions other than malignant neoplasm: Secondary | ICD-10-CM | POA: Diagnosis not present

## 2022-01-07 DIAGNOSIS — Z9981 Dependence on supplemental oxygen: Secondary | ICD-10-CM | POA: Diagnosis not present

## 2022-01-07 DIAGNOSIS — Z8709 Personal history of other diseases of the respiratory system: Secondary | ICD-10-CM | POA: Diagnosis not present

## 2022-01-07 DIAGNOSIS — Z8616 Personal history of COVID-19: Secondary | ICD-10-CM | POA: Diagnosis not present

## 2022-01-07 NOTE — Telephone Encounter (Signed)
Transition Care Management Follow-up Telephone Call Date of discharge and from where: [facility] 01-04-22 Dx: COVID How have you been since you were released from the hospital? Doing alright Any questions or concerns? No  Items Reviewed: Did the pt receive and understand the discharge instructions provided? Yes  Medications obtained and verified? Yes  Other? No  Any new allergies since your discharge? No  Dietary orders reviewed? Yes Do you have support at home? Yes   Home Care and Equipment/Supplies: Were home health services ordered? Yes PT If so, what is the name of the agency? Unsure of name   Has the agency set up a time to come to the patient's home? no Were any new equipment or medical supplies ordered?  Yes: oxygen What is the name of the medical supply agency? [employer] Were you able to get the supplies/equipment? yes Do you have any questions related to the use of the equipment or supplies? No  Functional Questionnaire: (I = Independent and D = Dependent) ADLs: I  Bathing/Dressing- I  Meal Prep- I  Eating- I  Maintaining continence- I  Transferring/Ambulation- I  Managing Meds- I  Follow up appointments reviewed:  PCP Hospital f/u appt confirmed? Yes  Scheduled to see Dr  Damita Dunnings on 01-14-22 @ [facility] f/u appt confirmed? No  . Are transportation arrangements needed? No  If their condition worsens, is the pt aware to call PCP or go to the Emergency Dept.? Yes Was the patient provided with contact information for the PCP's office or ED? Yes Was to pt encouraged to call back with questions or concerns? Yes

## 2022-01-08 ENCOUNTER — Encounter: Payer: Self-pay | Admitting: Pulmonary Disease

## 2022-01-08 ENCOUNTER — Other Ambulatory Visit: Payer: Self-pay

## 2022-01-08 ENCOUNTER — Ambulatory Visit: Payer: PPO | Admitting: Pulmonary Disease

## 2022-01-08 VITALS — BP 128/74 | HR 62 | Temp 98.5°F | Ht 72.0 in | Wt 271.4 lb

## 2022-01-08 DIAGNOSIS — R911 Solitary pulmonary nodule: Secondary | ICD-10-CM | POA: Diagnosis not present

## 2022-01-08 DIAGNOSIS — J9611 Chronic respiratory failure with hypoxia: Secondary | ICD-10-CM | POA: Diagnosis not present

## 2022-01-08 DIAGNOSIS — R0609 Other forms of dyspnea: Secondary | ICD-10-CM | POA: Diagnosis not present

## 2022-01-08 NOTE — Patient Instructions (Addendum)
Nice to see you again  Please check the name of the inhaler and call us back or send [place] a MyChart message so I can know what to refill.  If at some point in time the inhaler seems not be working as well, we can increase or escalate to a different inhaler.  I ordered the sniff test which is a test to make sure or evaluate if the main breathing muscles or diaphragms are working well.  You are at risk of them not working well after having radiation.  Someone will call you to schedule this.  Continue use oxygen when you walk around or exert yourself.  Use 2 L when you do this.  Return to clinic in 3 months or sooner as needed with Dr. Silas Flood

## 2022-01-09 NOTE — Progress Notes (Signed)
@Patient  ID: Christian [name], male    DOB: 1946-04-23, 76 y.o.   MRN: [id number]  Chief Complaint  Patient presents with   Consult    Consult for SOB. Pt states that he has always SOB but when he got covid it just got worse. Pt does wear oxygen at home at 2L.     Referring provider: [name], MD  HPI:   76 y.o. man whom we are seeing in hospital follow-up for COVID-19 infection and acute hypoxemic respiratory failure.  H&P, discharge summary reviewed.  Patient mid to the hospital with worsening dyspnea.  Had already scheduled appointment with this provider prior but dyspnea worsened.  Acutely worsened with cough.  Came to the ED.  Chest x-ray obtained which showed some chronic interstitial changes as well as chronic changes on the right after lobectomy without acute findings on my review interpretation.  His COVID test was positive.  He was treated with usual therapies for COVID.  His oxygen was eventually weaned.  Weaned off.  He also had a CTA during hospitalization given relatively clear parenchyma that did not show any clot but did show right upper lobe tree-in-bud consistent with likely viral infection on my review of interpretation.  Upon discharge she was ambulated and noted to require 2 L nasal cannula to maintain treatment over 88%.  Oxygen was prescribed.  He did not need any oxygen at rest.  Overall, he feels like his dyspnea is better than when he went to the hospital.  Still some baseline dyspnea.  We discussed at length the multiple reasons why this may be.   Questionaires / Pulmonary Flowsheets:   ACT:  No flowsheet data found.  MMRC: No flowsheet data found.  Epworth:  No flowsheet data found.  Tests:   FENO:  No results found for: NITRICOXIDE  PFT: PFT Results Latest Ref Rng & Units 12/19/2017  FVC-Pre L 3.60  FVC-Predicted Pre % 75  FVC-Post L 3.55  FVC-Predicted Post % 74  Pre FEV1/FVC % % 78  Post FEV1/FCV % % 64  FEV1-Pre L 2.80   FEV1-Predicted Pre % 79  FEV1-Post L 2.28  DLCO uncorrected ml/min/mmHg 24.35  DLCO UNC% % 69  DLVA Predicted % 88  2019 PFTs personally reviewed and interpreted as normal spirometry, no bronchodilator response, DLCO mild to moderately reduced although improved with correction for alveolar volume  WALK:  No flowsheet data found.  Imaging: Personally reviewed and as per EMR discussion in this note DG Chest 2 View  Result Date: 12/26/2021 CLINICAL DATA:  Shortness of breath EXAM: CHEST - 2 VIEW COMPARISON:  12/24/2021 FINDINGS: Volume loss right hemithorax. No new consolidation or edema. Chronic blunting right costophrenic angle. Stable cardiomediastinal contours. IMPRESSION: No acute process in the chest. Electronically Signed   By: Macy Mis M.D.   On: 12/26/2021 10:25   DG Chest 2 View  Result Date: 12/24/2021 CLINICAL DATA:  Cough and congestion, shortness of breath for 4 days, fever EXAM: CHEST - 2 VIEW COMPARISON:  07/11/2021, 09/24/2021 FINDINGS: Frontal and lateral views of the chest demonstrate a stable cardiac silhouette. Diffuse increased interstitial prominence consistent with scarring. Volume loss right lung base consistent with prior right lower lobe resection. No acute airspace disease. Chronic right pleural thickening versus trace right effusion. No pneumothorax. The lingular nodule seen on recent CT is not readily apparent on this exam. No acute bony abnormalities. IMPRESSION: 1. Trace right pleural effusion versus chronic pleural thickening. 2. Chronic interstitial scarring without  acute airspace disease. 3. The suspicious lingular nodule seen on previous CT not well visualized by x-ray. Electronically Signed   By: Randa Ngo M.D.   On: 12/24/2021 17:51   CT HEAD WO CONTRAST (5MM)  Result Date: 12/27/2021 CLINICAL DATA:  Neuro deficit, stroke suspected, choreiform movements. Evaluate for old basal ganglia stroke. EXAM: CT HEAD WITHOUT CONTRAST TECHNIQUE: Contiguous  axial images were obtained from the base of the skull through the vertex without intravenous contrast. RADIATION DOSE REDUCTION: This exam was performed according to the departmental dose-optimization program which includes automated exposure control, adjustment of the mA and/or kV according to patient size and/or use of iterative reconstruction technique. COMPARISON:  01/01/2018. FINDINGS: Brain: No acute intracranial hemorrhage, midline shift or mass effect. No extra-axial fluid collection. Diffuse atrophy is noted. Subcortical and periventricular white matter hypodensities are present bilaterally. No hydrocephalus. Vascular: Atherosclerotic calcification of the carotid siphons. No hyperdense vessel. Skull: Normal. Negative for fracture or focal lesion. Sinuses/Orbits: Mucosal thickening is present in the ethmoid air cells, maxillary sinuses, and sphenoid sinuses bilaterally and frontal sinus on the left. No acute orbital abnormality. Other: There is opacification of the mastoid air cells and middle ear on the left. IMPRESSION: 1. No acute intracranial process. 2. Atrophy with chronic microvascular ischemic changes. 3. Paranasal sinus disease. 4. Opacification of the mastoid air cells and middle ear on the left, possible mastoiditis versus cholesteatoma. Electronically Signed   By: Brett Fairy M.D.   On: 12/27/2021 22:01   CT Angio Chest Pulmonary Embolism (PE) W or WO Contrast  Result Date: 12/31/2021 CLINICAL DATA:  Shortness of breath, elevated D-dimer, COVID-19 positive EXAM: CT ANGIOGRAPHY CHEST WITH CONTRAST TECHNIQUE: Multidetector CT imaging of the chest was performed using the standard protocol during bolus administration of intravenous contrast. Multiplanar CT image reconstructions and MIPs were obtained to evaluate the vascular anatomy. RADIATION DOSE REDUCTION: This exam was performed according to the departmental dose-optimization program which includes automated exposure control, adjustment of  the mA and/or kV according to patient size and/or use of iterative reconstruction technique. CONTRAST:  33mL OMNIPAQUE IOHEXOL 350 MG/ML SOLN COMPARISON:  X-ray 12/28/2021, CT 09/24/2021 FINDINGS: Cardiovascular: Satisfactory opacification of the pulmonary arteries to the segmental level. No evidence of pulmonary embolism. Thoracic aorta is nonaneurysmal. Scattered atherosclerotic calcifications of the aorta and coronary arteries. Mild cardiomegaly. No pericardial effusion. Mediastinum/Nodes: No enlarged mediastinal, hilar, or axillary lymph nodes. Thyroid gland, trachea, and esophagus demonstrate no significant findings. Lungs/Pleura: Postsurgical changes from prior right middle lobe and right lower lobe lobectomies. Interval development of multifocal tree-in-bud nodularity and subtle ground-glass opacity within the inferior aspect of the right upper lobe. Right upper lobe noncalcified pulmonary nodule appears slightly enlarged now measuring 9 mm (previously measured 7 mm), on series 6, image 73. Previously described area of nodularity within the lingula has decreased in size compared to the previous study now measuring 8 x 5 mm (previously 13 x 7 mm), on series 6, image 75. No pleural effusion or pneumothorax. Upper Abdomen: Stable subcentimeter low-density lesion at the right hepatic dome. No new or acute findings within the included upper abdomen. Musculoskeletal: Unchanged compression fractures of T3 and T5. No new fracture. Review of the MIP images confirms the above findings. IMPRESSION: 1. No evidence of acute pulmonary embolism. 2. Interval development of multifocal tree-in-bud nodularity and subtle ground-glass opacity within the inferior aspect of the right upper lobe, suggestive of an infectious or inflammatory bronchiolitis. 3. Slight interval increase in size of a noncalcified pulmonary nodule  within the right upper lobe now measuring 9 mm (previously measured 7 mm). Primary neoplasm or metastatic  disease are both considerations. Short-term follow-up with CT or PET-CT is recommended. 4. Previously described area of nodularity within the lingula has decreased in size compared to the previous study, attention on follow-up. 5. Unchanged compression fractures of T3 and T5. Aortic Atherosclerosis (ICD10-I70.0). Electronically Signed   By: Davina Poke D.O.   On: 12/31/2021 18:25   DG Chest Port 1 View  Result Date: 12/28/2021 CLINICAL DATA:  Acute respiratory distress. EXAM: PORTABLE CHEST 1 VIEW COMPARISON:  12/26/2021 FINDINGS: 0453 hours. Low volume film. The cardio pericardial silhouette is enlarged. There is pulmonary vascular congestion without overt pulmonary edema. Airspace disease at the right base is suspicious for pneumonia in there is a tiny right pleural effusion. Bones are diffusely demineralized. IMPRESSION: Low volume film with right base pneumonia and tiny right pleural effusion. Electronically Signed   By: Misty Stanley M.D.   On: 12/28/2021 17:28    Lab Results: Personally reviewed CBC    Component Value Date/Time   WBC 8.1 01/04/2022 0502   RBC 3.44 (L) 01/04/2022 0502   HGB 11.8 (L) 01/04/2022 0502   HGB 11.6 (L) 09/24/2021 1342   HCT 37.0 (L) 01/04/2022 0502   PLT 154 01/04/2022 0502   PLT 159 09/24/2021 1342   MCV 107.6 (H) 01/04/2022 0502   MCV 104.3 (A) 02/27/2015 1454   MCH 34.3 (H) 01/04/2022 0502   MCHC 31.9 01/04/2022 0502   RDW 15.0 01/04/2022 0502   LYMPHSABS 0.6 (L) 12/31/2021 0449   MONOABS 1.0 12/31/2021 0449   EOSABS 0.0 12/31/2021 0449   BASOSABS 0.0 12/31/2021 0449    BMET    Component Value Date/Time   NA 139 01/04/2022 0502   K 4.3 01/04/2022 0502   CL 100 01/04/2022 0502   CO2 34 (H) 01/04/2022 0502   GLUCOSE 199 (H) 01/04/2022 0502   BUN 29 (H) 01/04/2022 0502   CREATININE 1.09 01/04/2022 0502   CREATININE 1.42 (H) 09/24/2021 1342   CREATININE 0.99 07/11/2016 1448   CALCIUM 8.8 (L) 01/04/2022 0502   GFRNONAA >60 01/04/2022 0502    GFRNONAA 52 (L) 09/24/2021 1342   GFRNONAA 78 07/11/2016 1448   GFRAA >60 06/13/2020 0751   GFRAA >89 07/11/2016 1448    BNP    Component Value Date/Time   BNP 69.9 12/26/2021 1046    ProBNP    Component Value Date/Time   PROBNP 73.0 11/13/2021 1246    Specialty Problems       Pulmonary Problems   Obstructive chronic bronchitis without exacerbation (HCC)   OSA on CPAP   Adenocarcinoma of right lung, stage 1 (HCC)   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)   Pneumonia due to COVID-19 virus   Acute respiratory distress   DOE (dyspnea on exertion)    Allergies  Allergen Reactions   Celebrex [Celecoxib] Itching    Immunization History  Administered Date(s) Administered   DTaP 12/14/2009   Fluad Quad(high Dose 65+) 10/15/2019, 10/30/2020, 11/13/2021   Influenza Split 12/14/2009, 11/17/2014   Influenza,inj,Quad PF,6+ Mos 08/28/2018   Influenza-Unspecified 09/25/2016   Moderna Sars-Covid-2 Vaccination 01/10/2020, 02/07/2020   PFIZER(Purple Top)SARS-COV-2 Vaccination 10/31/2020   Pneumococcal Conjugate-13 07/30/2017   Pneumococcal Polysaccharide-23 06/02/2013, 08/28/2018   Td 05/11/2018    Past Medical History:  Diagnosis Date   Anxiety    Arthritis    Cancer ([place])    skin   Coronary artery disease  Diabetes mellitus type 2 with complications (HCC)    not on medication was "taken off of list"; pt states that he was pre diabetic   Dyspnea    walks 100 yards then rest   GERD (gastroesophageal reflux disease)    ocassional no meds; pt states that its no longer an issue (01/14/18)   History of radiation therapy    Left lung- 10/22/21-10/30/21- Dr. 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   OSA on CPAP    Pneumonia 10/2017    Tobacco History: Social History   Tobacco Use  Smoking Status Former   Packs/day: 1.00   Years: 51.00   Pack years: 51.00   Types: Cigarettes, E-cigarettes   Quit date: 01/15/2018   Years since quitting: 3.9  Smokeless Tobacco Never  Tobacco Comments   Pt currently vapes daily   Counseling given: Not Answered Tobacco comments: Pt currently vapes daily   Continue to not smoke  Outpatient Encounter Medications as of 01/08/2022  Medication Sig   albuterol (VENTOLIN HFA) 108 (90 Base) MCG/ACT inhaler Inhale 1-2 puffs into the lungs every 6 (six) hours as needed for wheezing or shortness of breath.   aspirin 325 MG tablet Take 162.5 mg by mouth daily.   baclofen (LIORESAL) 20 MG tablet TAKE 1 TABLET (20 MG TOTAL) BY MOUTH 2 (TWO) TIMES DAILY AS NEEDED FOR MUSCLE SPASMS. (Patient taking differently: Take 40 mg by mouth at bedtime.)   clonazePAM (KLONOPIN) 0.5 MG tablet Take 1 tablet (0.5 mg total) by mouth at bedtime as needed. For insomnia   lisinopril (ZESTRIL) 10 MG tablet Take 10 mg by mouth daily.   Melatonin 10 MG TABS Take 10 mg by mouth at bedtime.    metFORMIN (GLUCOPHAGE) 500 MG tablet TAKE 1 TABLET BY MOUTH EVERY DAY WITH BREAKFAST (Patient taking differently: Take 500 mg by mouth 2 (two) times daily with a meal.)   metoprolol succinate (TOPROL-XL) 25 MG 24 hr tablet Take 1 tablet (25 mg total) by mouth at bedtime.   nitroGLYCERIN (NITROSTAT) 0.4 MG SL tablet Place 1 tablet (0.4 mg total) under the tongue every 5 (five) minutes as needed for chest pain.   predniSONE (DELTASONE) 10 MG tablet Take 10 mg by mouth 2 (two) times daily.   simvastatin (ZOCOR) 40 MG tablet Take 1 tablet (40 mg total) by mouth at bedtime.   vitamin E 100 UNIT capsule Take 100 Units by mouth daily.   benzonatate (TESSALON) 100 MG capsule Take 1 capsule (100 mg total) by mouth 3 (three) times daily as needed for cough.  (Patient not taking: Reported on 01/08/2022)   No facility-administered encounter medications on file as of 01/08/2022.     Review of Systems  Review of Systems  He denies chest pain.  Denies orthopnea or PND at this visit.  Comprehensive review of systems otherwise negative. Physical Exam  BP 128/74 (BP Location: Left Arm, Patient Position: Sitting, Cuff Size: Normal)    Pulse 62    Temp 98.5 F (36.9 C) (Oral)    Ht 6' (1.829 m)    Wt 271 lb 6.4 oz (123.1  kg)    SpO2 96%    BMI 36.81 kg/m   Wt Readings from Last 5 Encounters:  01/08/22 271 lb 6.4 oz (123.1 kg)  12/26/21 270 lb (122.5 kg)  11/13/21 272 lb (123.4 kg)  10/04/21 279 lb (126.6 kg)  09/27/21 280 lb 4.8 oz (127.1 kg)    BMI Readings from Last 5 Encounters:  01/08/22 36.81 kg/m  12/26/21 36.62 kg/m  11/13/21 36.89 kg/m  10/04/21 37.84 kg/m  09/27/21 38.02 kg/m     Physical Exam General: Sitting in chair, no acute distress Eyes: EOMI, no icterus Neck: Supple, no JVP appreciated Pulmonary: Clear, normal work of breathing, diminished on the right Cardiovascular: Regular rate and rhythm, no murmur Abdomen: Distended, nontender MSK: No synovitis, no joint effusion Neuro: Chorea like movements of the head, ambulates with assistance of cane, sensation intact Psych: Normal mood, full affect   Assessment & Plan:   Dyspnea on exertion: Likely multifactorial related to reduced lung volumes after right middle and right lower lobe lobectomy, potential evolving radiation changes although minimal to no evidence of radiation fibrosis on prior scans, deconditioning, obesity, recent COVID infection.  Slowly improving prior.  Would likely most benefit from increased activity, pulmonary rehab.  He declines at this time.  Continue inhaler therapy, he is to contact me to let me know what he was sent home from the hospital, these are not in the hospital records or discharge summary.  Will provide refills as he does not like it  is a bit helpful.  Sniff test ordered given report of dyspnea when lying flat although he no longer endorses this at visit today.  At risk of phrenic nerve injury with bilateral radiation to adrenal glands as well as prior right-sided lobectomy.  Chronic hypoxemic respiratory failure: 2 L with exertion, none at rest.  Related to the above.  In addition, emphysema on scans contributing.  Pulmonary nodules: Lingular nodule decreased in size 09/27/2021 to 12/28/2021.  Slight increase in right upper lobe nodule to 9 mm from 7 mm over the same timeframe.  We will plan repeat CT scan in 3 months for further evaluation, lesion too small for adequate PET characterization as  less than 1 cm.  History of lung cancer: Status post right middle lobe and right lower lobe lobectomies.  Follows with Dr. Julien Nordmann.  Will message him regarding recent CT scan needs and plan for repeat CT scan in 3 months.   Return in about 3 months (around 04/07/2022).   Lanier Clam, MD 01/09/2022   This appointment required 45 minutes of patient care (this includes precharting, chart review, review of results, face-to-face care, etc.).

## 2022-01-10 DIAGNOSIS — Z6836 Body mass index (BMI) 36.0-36.9, adult: Secondary | ICD-10-CM | POA: Diagnosis not present

## 2022-01-10 DIAGNOSIS — J9601 Acute respiratory failure with hypoxia: Secondary | ICD-10-CM | POA: Diagnosis not present

## 2022-01-10 DIAGNOSIS — E1122 Type 2 diabetes mellitus with diabetic chronic kidney disease: Secondary | ICD-10-CM | POA: Diagnosis not present

## 2022-01-10 DIAGNOSIS — I2510 Atherosclerotic heart disease of native coronary artery without angina pectoris: Secondary | ICD-10-CM | POA: Diagnosis not present

## 2022-01-10 DIAGNOSIS — N1831 Chronic kidney disease, stage 3a: Secondary | ICD-10-CM | POA: Diagnosis not present

## 2022-01-10 DIAGNOSIS — D539 Nutritional anemia, unspecified: Secondary | ICD-10-CM | POA: Diagnosis not present

## 2022-01-10 DIAGNOSIS — E669 Obesity, unspecified: Secondary | ICD-10-CM | POA: Diagnosis not present

## 2022-01-10 DIAGNOSIS — G4733 Obstructive sleep apnea (adult) (pediatric): Secondary | ICD-10-CM | POA: Diagnosis not present

## 2022-01-10 DIAGNOSIS — Z7952 Long term (current) use of systemic steroids: Secondary | ICD-10-CM | POA: Diagnosis not present

## 2022-01-10 DIAGNOSIS — J90 Pleural effusion, not elsewhere classified: Secondary | ICD-10-CM | POA: Diagnosis not present

## 2022-01-10 DIAGNOSIS — Z7984 Long term (current) use of oral hypoglycemic drugs: Secondary | ICD-10-CM | POA: Diagnosis not present

## 2022-01-10 DIAGNOSIS — D6489 Other specified anemias: Secondary | ICD-10-CM | POA: Diagnosis not present

## 2022-01-10 DIAGNOSIS — I1310 Hypertensive heart and chronic kidney disease without heart failure, with stage 1 through stage 4 chronic kidney disease, or unspecified chronic kidney disease: Secondary | ICD-10-CM | POA: Diagnosis not present

## 2022-01-10 DIAGNOSIS — G255 Other chorea: Secondary | ICD-10-CM | POA: Diagnosis not present

## 2022-01-10 DIAGNOSIS — G319 Degenerative disease of nervous system, unspecified: Secondary | ICD-10-CM | POA: Diagnosis not present

## 2022-01-10 DIAGNOSIS — J1282 Pneumonia due to coronavirus disease 2019: Secondary | ICD-10-CM | POA: Diagnosis not present

## 2022-01-10 DIAGNOSIS — D631 Anemia in chronic kidney disease: Secondary | ICD-10-CM | POA: Diagnosis not present

## 2022-01-10 DIAGNOSIS — D630 Anemia in neoplastic disease: Secondary | ICD-10-CM | POA: Diagnosis not present

## 2022-01-10 DIAGNOSIS — U071 COVID-19: Secondary | ICD-10-CM | POA: Diagnosis not present

## 2022-01-10 DIAGNOSIS — C3432 Malignant neoplasm of lower lobe, left bronchus or lung: Secondary | ICD-10-CM | POA: Diagnosis not present

## 2022-01-10 DIAGNOSIS — Z9981 Dependence on supplemental oxygen: Secondary | ICD-10-CM | POA: Diagnosis not present

## 2022-01-10 DIAGNOSIS — I700 Atherosclerosis of aorta: Secondary | ICD-10-CM | POA: Diagnosis not present

## 2022-01-10 DIAGNOSIS — C342 Malignant neoplasm of middle lobe, bronchus or lung: Secondary | ICD-10-CM | POA: Diagnosis not present

## 2022-01-10 DIAGNOSIS — F411 Generalized anxiety disorder: Secondary | ICD-10-CM | POA: Diagnosis not present

## 2022-01-14 ENCOUNTER — Encounter: Payer: Self-pay | Admitting: Family Medicine

## 2022-01-14 ENCOUNTER — Other Ambulatory Visit: Payer: Self-pay

## 2022-01-14 ENCOUNTER — Ambulatory Visit (INDEPENDENT_AMBULATORY_CARE_PROVIDER_SITE_OTHER): Payer: PPO | Admitting: Family Medicine

## 2022-01-14 VITALS — BP 122/78 | HR 70 | Temp 98.6°F

## 2022-01-14 DIAGNOSIS — G4700 Insomnia, unspecified: Secondary | ICD-10-CM

## 2022-01-14 DIAGNOSIS — G255 Other chorea: Secondary | ICD-10-CM | POA: Diagnosis not present

## 2022-01-14 DIAGNOSIS — E118 Type 2 diabetes mellitus with unspecified complications: Secondary | ICD-10-CM

## 2022-01-14 DIAGNOSIS — R0602 Shortness of breath: Secondary | ICD-10-CM

## 2022-01-14 DIAGNOSIS — R609 Edema, unspecified: Secondary | ICD-10-CM

## 2022-01-14 LAB — CBC WITH DIFFERENTIAL/PLATELET
Basophils Absolute: 0.1 10*3/uL (ref 0.0–0.1)
Basophils Relative: 1.3 % (ref 0.0–3.0)
Eosinophils Absolute: 0.2 10*3/uL (ref 0.0–0.7)
Eosinophils Relative: 3.2 % (ref 0.0–5.0)
HCT: 34.1 % — ABNORMAL LOW (ref 39.0–52.0)
Hemoglobin: 11.4 g/dL — ABNORMAL LOW (ref 13.0–17.0)
Lymphocytes Relative: 14.5 % (ref 12.0–46.0)
Lymphs Abs: 0.7 10*3/uL (ref 0.7–4.0)
MCHC: 33.4 g/dL (ref 30.0–36.0)
MCV: 104.2 fl — ABNORMAL HIGH (ref 78.0–100.0)
Monocytes Absolute: 0.5 10*3/uL (ref 0.1–1.0)
Monocytes Relative: 11.1 % (ref 3.0–12.0)
Neutro Abs: 3.4 10*3/uL (ref 1.4–7.7)
Neutrophils Relative %: 69.9 % (ref 43.0–77.0)
Platelets: 136 10*3/uL — ABNORMAL LOW (ref 150.0–400.0)
RBC: 3.28 Mil/uL — ABNORMAL LOW (ref 4.22–5.81)
RDW: 15.1 % (ref 11.5–15.5)
WBC: 4.8 10*3/uL (ref 4.0–10.5)

## 2022-01-14 LAB — COMPREHENSIVE METABOLIC PANEL
ALT: 21 U/L (ref 0–53)
AST: 12 U/L (ref 0–37)
Albumin: 3.8 g/dL (ref 3.5–5.2)
Alkaline Phosphatase: 67 U/L (ref 39–117)
BUN: 14 mg/dL (ref 6–23)
CO2: 30 mEq/L (ref 19–32)
Calcium: 9.1 mg/dL (ref 8.4–10.5)
Chloride: 103 mEq/L (ref 96–112)
Creatinine, Ser: 0.97 mg/dL (ref 0.40–1.50)
GFR: 77.51 mL/min (ref >60.00)
Glucose, Bld: 268 mg/dL — ABNORMAL HIGH (ref 70–99)
Potassium: 4.3 mEq/L (ref 3.5–5.1)
Sodium: 137 mEq/L (ref 135–145)
Total Bilirubin: 0.5 mg/dL (ref 0.2–1.2)
Total Protein: 6.7 g/dL (ref 6.0–8.3)

## 2022-01-14 LAB — BRAIN NATRIURETIC PEPTIDE: Pro B Natriuretic peptide (BNP): 76 pg/mL (ref 0.0–100.0)

## 2022-01-14 MED ORDER — FUROSEMIDE 20 MG PO TABS: 20.0000 mg | ORAL_TABLET | Freq: Every day | ORAL | 3 refills | Status: DC | PRN

## 2022-01-14 MED ORDER — CLONAZEPAM 0.5 MG PO TABS: 0.5000 mg | ORAL_TABLET | Freq: Two times a day (BID) | ORAL | 1 refills | Status: AC | PRN

## 2022-01-14 MED ORDER — METFORMIN HCL 500 MG PO TABS: ORAL_TABLET | ORAL | Status: AC

## 2022-01-14 MED ORDER — BACLOFEN 20 MG PO TABS: 20.0000 mg | ORAL_TABLET | Freq: Two times a day (BID) | ORAL | Status: DC | PRN

## 2022-01-14 NOTE — Progress Notes (Signed)
This visit occurred during the SARS-CoV-2 public health emergency.  Safety protocols were in place, including screening questions prior to the visit, additional usage of staff PPE, and extensive cleaning of exam room while observing appropriate contact time as indicated for disinfecting solutions.  Inpatient f/u.  He had covid.  Done with steroids and antivirals now.  He is using O2 with exertion but not o/w.  His breathing is better than upon admission but still can get SOB with exertion/need O2 at that point.  No SOB in discussion at rest today at [facility].  Pulmonary to repeat CT chest in 3 months.  D/w pt.   Chorea with neuro f/u d/w pt.  Movement worse with illness better in the meantime.  Klonopin helped with movement at night.  Not sedated on med.  He wanted to defer neuro eval at this point.  See avs.    BLE edema noted, since hospitalization.  Better recently with elevation.  No CP.  Some SOB but not worse than prev and not at rest.    We talked about celebrex use- he has tolerated except for itching with concurrent sig sun exposure, ie photosensitivity.    Meds, vitals, and allergies reviewed.   ROS: Per HPI unless specifically indicated in ROS section   Nad [place] Speaking in complete sentences.  Not using O2 at time of office visit. Episodic head movement noted but not a uniform tremor.  Speech wnl. Neck supple. No LA Ctab Rrr 1+ BLE  Skin well perfused.

## 2022-01-14 NOTE — Patient Instructions (Addendum)
Go to the lab on the way out.   If you have mychart we'll likely use that to update you.    Take care.  Glad to see you. Let me know if/when you want to go see neurology about the movement.   Try taking klonopin twice a day if needed.  Sedation caution.   Try using lasix for fluid if needed, in the AM.  Update me in about 3 days, sooner if needed.

## 2022-01-15 ENCOUNTER — Telehealth: Payer: Self-pay | Admitting: Family Medicine

## 2022-01-15 NOTE — Telephone Encounter (Signed)
Verbal orders given  

## 2022-01-15 NOTE — Telephone Encounter (Signed)
Home Health verbal orders [place] Name:Free Agency Name: [place] number: [phone]  Requesting OT/PT/Skilled nursing/Social Work/Speech:  Reason:PT  Frequency:1wk for 1wk, 2wks for 3wks, 1wk for 1wk  Please forward to [facility] pool or providers CMA

## 2022-01-15 NOTE — Telephone Encounter (Signed)
Please give the order.  Thanks.   

## 2022-01-16 ENCOUNTER — Other Ambulatory Visit: Payer: Self-pay

## 2022-01-16 ENCOUNTER — Ambulatory Visit (HOSPITAL_COMMUNITY)
Admission: RE | Admit: 2022-01-16 | Discharge: 2022-01-16 | Disposition: A | Discharge status: Home / Self Care | Payer: PPO | Source: Ambulatory Visit | Attending: Pulmonary Disease | Admitting: Pulmonary Disease

## 2022-01-16 DIAGNOSIS — R0609 Other forms of dyspnea: Secondary | ICD-10-CM | POA: Diagnosis not present

## 2022-01-16 DIAGNOSIS — R0600 Dyspnea, unspecified: Secondary | ICD-10-CM | POA: Diagnosis not present

## 2022-01-17 DIAGNOSIS — R609 Edema, unspecified: Secondary | ICD-10-CM | POA: Insufficient documentation

## 2022-01-17 NOTE — Assessment & Plan Note (Signed)
Lungs clear.  Okay for outpatient follow-up.  See notes on labs. Reasonable to try using lasix for fluid if needed, in the AM.  I asked him to update me in about 3 days, sooner if needed.

## 2022-01-17 NOTE — Assessment & Plan Note (Addendum)
Discussed inpatient work-up.  He wanted to defer neurology evaluation at this point.  He can let me know if/when he wants to go.  His symptoms were worse in the midst of acute illness but some better now.  Noted the Klonopin helped his symptoms especially at night and it would be okay to use as needed.  Sedation caution discussed with patient.

## 2022-01-17 NOTE — Assessment & Plan Note (Signed)
See notes on labs.  He is better compared to admission and discharge.  Lungs are clear.  Continue O2 use with exertion.  Not needing it at rest.  See discussion of edema regarding Lasix use.

## 2022-01-22 DIAGNOSIS — D692 Other nonthrombocytopenic purpura: Secondary | ICD-10-CM | POA: Diagnosis not present

## 2022-01-22 DIAGNOSIS — Z7984 Long term (current) use of oral hypoglycemic drugs: Secondary | ICD-10-CM | POA: Diagnosis not present

## 2022-01-22 DIAGNOSIS — Z85858 Personal history of malignant neoplasm of other endocrine glands: Secondary | ICD-10-CM | POA: Diagnosis not present

## 2022-01-22 DIAGNOSIS — C3431 Malignant neoplasm of lower lobe, right bronchus or lung: Secondary | ICD-10-CM | POA: Diagnosis not present

## 2022-01-22 DIAGNOSIS — Z7982 Long term (current) use of aspirin: Secondary | ICD-10-CM | POA: Diagnosis not present

## 2022-01-22 DIAGNOSIS — Z9981 Dependence on supplemental oxygen: Secondary | ICD-10-CM | POA: Diagnosis not present

## 2022-01-22 DIAGNOSIS — J449 Chronic obstructive pulmonary disease, unspecified: Secondary | ICD-10-CM | POA: Diagnosis not present

## 2022-01-22 DIAGNOSIS — E119 Type 2 diabetes mellitus without complications: Secondary | ICD-10-CM | POA: Diagnosis not present

## 2022-01-22 DIAGNOSIS — Z923 Personal history of irradiation: Secondary | ICD-10-CM | POA: Diagnosis not present

## 2022-01-22 DIAGNOSIS — Z902 Acquired absence of lung [part of]: Secondary | ICD-10-CM | POA: Diagnosis not present

## 2022-01-28 DIAGNOSIS — J9601 Acute respiratory failure with hypoxia: Secondary | ICD-10-CM | POA: Diagnosis not present

## 2022-01-28 DIAGNOSIS — C342 Malignant neoplasm of middle lobe, bronchus or lung: Secondary | ICD-10-CM | POA: Diagnosis not present

## 2022-01-28 DIAGNOSIS — N1831 Chronic kidney disease, stage 3a: Secondary | ICD-10-CM | POA: Diagnosis not present

## 2022-01-28 DIAGNOSIS — I2510 Atherosclerotic heart disease of native coronary artery without angina pectoris: Secondary | ICD-10-CM | POA: Diagnosis not present

## 2022-01-28 DIAGNOSIS — F411 Generalized anxiety disorder: Secondary | ICD-10-CM | POA: Diagnosis not present

## 2022-01-28 DIAGNOSIS — U071 COVID-19: Secondary | ICD-10-CM | POA: Diagnosis not present

## 2022-01-28 DIAGNOSIS — J1282 Pneumonia due to coronavirus disease 2019: Secondary | ICD-10-CM | POA: Diagnosis not present

## 2022-01-28 DIAGNOSIS — C3432 Malignant neoplasm of lower lobe, left bronchus or lung: Secondary | ICD-10-CM | POA: Diagnosis not present

## 2022-01-28 DIAGNOSIS — E669 Obesity, unspecified: Secondary | ICD-10-CM | POA: Diagnosis not present

## 2022-01-28 DIAGNOSIS — G4733 Obstructive sleep apnea (adult) (pediatric): Secondary | ICD-10-CM | POA: Diagnosis not present

## 2022-01-28 DIAGNOSIS — G319 Degenerative disease of nervous system, unspecified: Secondary | ICD-10-CM | POA: Diagnosis not present

## 2022-01-28 DIAGNOSIS — D631 Anemia in chronic kidney disease: Secondary | ICD-10-CM | POA: Diagnosis not present

## 2022-01-28 DIAGNOSIS — D539 Nutritional anemia, unspecified: Secondary | ICD-10-CM | POA: Diagnosis not present

## 2022-01-28 DIAGNOSIS — Z7952 Long term (current) use of systemic steroids: Secondary | ICD-10-CM | POA: Diagnosis not present

## 2022-01-28 DIAGNOSIS — D630 Anemia in neoplastic disease: Secondary | ICD-10-CM | POA: Diagnosis not present

## 2022-01-28 DIAGNOSIS — Z9981 Dependence on supplemental oxygen: Secondary | ICD-10-CM | POA: Diagnosis not present

## 2022-01-28 DIAGNOSIS — I1310 Hypertensive heart and chronic kidney disease without heart failure, with stage 1 through stage 4 chronic kidney disease, or unspecified chronic kidney disease: Secondary | ICD-10-CM | POA: Diagnosis not present

## 2022-01-28 DIAGNOSIS — D6489 Other specified anemias: Secondary | ICD-10-CM | POA: Diagnosis not present

## 2022-01-28 DIAGNOSIS — Z7984 Long term (current) use of oral hypoglycemic drugs: Secondary | ICD-10-CM | POA: Diagnosis not present

## 2022-01-28 DIAGNOSIS — J90 Pleural effusion, not elsewhere classified: Secondary | ICD-10-CM | POA: Diagnosis not present

## 2022-01-28 DIAGNOSIS — Z6836 Body mass index (BMI) 36.0-36.9, adult: Secondary | ICD-10-CM | POA: Diagnosis not present

## 2022-01-28 DIAGNOSIS — G255 Other chorea: Secondary | ICD-10-CM | POA: Diagnosis not present

## 2022-01-28 DIAGNOSIS — I700 Atherosclerosis of aorta: Secondary | ICD-10-CM | POA: Diagnosis not present

## 2022-01-28 DIAGNOSIS — E1122 Type 2 diabetes mellitus with diabetic chronic kidney disease: Secondary | ICD-10-CM | POA: Diagnosis not present

## 2022-02-02 ENCOUNTER — Other Ambulatory Visit: Payer: Self-pay | Admitting: Family Medicine

## 2022-02-05 DIAGNOSIS — R0603 Acute respiratory distress: Secondary | ICD-10-CM | POA: Diagnosis not present

## 2022-02-05 DIAGNOSIS — R0609 Other forms of dyspnea: Secondary | ICD-10-CM | POA: Diagnosis not present

## 2022-02-05 DIAGNOSIS — U071 COVID-19: Secondary | ICD-10-CM | POA: Diagnosis not present

## 2022-02-18 ENCOUNTER — Ambulatory Visit: Payer: PPO | Admitting: Family Medicine

## 2022-03-02 ENCOUNTER — Other Ambulatory Visit: Payer: Self-pay | Admitting: Family Medicine

## 2022-03-05 DIAGNOSIS — R0609 Other forms of dyspnea: Secondary | ICD-10-CM | POA: Diagnosis not present

## 2022-03-19 ENCOUNTER — Telehealth: Payer: Self-pay | Admitting: Pulmonary Disease

## 2022-03-19 NOTE — Telephone Encounter (Signed)
[name] at [facility] called & left vm to make [place] aware pt canceled Super D CT that was schedueld for 4/13 stating Dr Inda Merlin ordered Chest CT without and Abd/Pelvis which is scheduled on 4/18 at [facility] and he can't afford to do both.  ?

## 2022-03-19 NOTE — Telephone Encounter (Signed)
MH this is just an FYI  ? ? ?- Taryn at [facility] called & left vm to make [place] aware pt canceled Super D CT that was schedueld for 4/13 stating Dr Inda Merlin ordered Chest CT without and Abd/Pelvis which is scheduled on 4/18 at [facility] and he can't afford to do both.  ?

## 2022-03-21 ENCOUNTER — Other Ambulatory Visit: Payer: PPO

## 2022-03-26 ENCOUNTER — Other Ambulatory Visit: Payer: Self-pay

## 2022-03-26 ENCOUNTER — Ambulatory Visit (HOSPITAL_COMMUNITY)
Admission: RE | Admit: 2022-03-26 | Discharge: 2022-03-26 | Disposition: A | Discharge status: Home / Self Care | Payer: PPO | Source: Ambulatory Visit | Attending: Internal Medicine | Admitting: Internal Medicine

## 2022-03-26 ENCOUNTER — Inpatient Hospital Stay: Payer: PPO

## 2022-03-26 ENCOUNTER — Inpatient Hospital Stay: Payer: PPO | Attending: Internal Medicine

## 2022-03-26 DIAGNOSIS — C3490 Malignant neoplasm of unspecified part of unspecified bronchus or lung: Secondary | ICD-10-CM | POA: Insufficient documentation

## 2022-03-26 DIAGNOSIS — N281 Cyst of kidney, acquired: Secondary | ICD-10-CM | POA: Diagnosis not present

## 2022-03-26 DIAGNOSIS — J449 Chronic obstructive pulmonary disease, unspecified: Secondary | ICD-10-CM | POA: Insufficient documentation

## 2022-03-26 DIAGNOSIS — I700 Atherosclerosis of aorta: Secondary | ICD-10-CM | POA: Diagnosis not present

## 2022-03-26 DIAGNOSIS — C3431 Malignant neoplasm of lower lobe, right bronchus or lung: Secondary | ICD-10-CM | POA: Insufficient documentation

## 2022-03-26 DIAGNOSIS — J439 Emphysema, unspecified: Secondary | ICD-10-CM | POA: Diagnosis not present

## 2022-03-26 DIAGNOSIS — C7971 Secondary malignant neoplasm of right adrenal gland: Secondary | ICD-10-CM | POA: Insufficient documentation

## 2022-03-26 DIAGNOSIS — J90 Pleural effusion, not elsewhere classified: Secondary | ICD-10-CM | POA: Diagnosis not present

## 2022-03-26 DIAGNOSIS — R918 Other nonspecific abnormal finding of lung field: Secondary | ICD-10-CM | POA: Insufficient documentation

## 2022-03-26 DIAGNOSIS — K7689 Other specified diseases of liver: Secondary | ICD-10-CM | POA: Diagnosis not present

## 2022-03-26 DIAGNOSIS — I7133 Infrarenal abdominal aortic aneurysm, ruptured: Secondary | ICD-10-CM | POA: Diagnosis not present

## 2022-03-26 LAB — CBC WITH DIFFERENTIAL (CANCER CENTER ONLY)
Abs Immature Granulocytes: 0.02 10*3/uL (ref 0.00–0.07)
Basophils Absolute: 0.1 10*3/uL (ref 0.0–0.1)
Basophils Relative: 1 %
Eosinophils Absolute: 0.2 10*3/uL (ref 0.0–0.5)
Eosinophils Relative: 2 %
HCT: 34.4 % — ABNORMAL LOW (ref 39.0–52.0)
Hemoglobin: 11.2 g/dL — ABNORMAL LOW (ref 13.0–17.0)
Immature Granulocytes: 0 %
Lymphocytes Relative: 15 %
Lymphs Abs: 1.1 10*3/uL (ref 0.7–4.0)
MCH: 34.4 pg — ABNORMAL HIGH (ref 26.0–34.0)
MCHC: 32.6 g/dL (ref 30.0–36.0)
MCV: 105.5 fL — ABNORMAL HIGH (ref 80.0–100.0)
Monocytes Absolute: 0.8 10*3/uL (ref 0.1–1.0)
Monocytes Relative: 11 %
Neutro Abs: 5.5 10*3/uL (ref 1.7–7.7)
Neutrophils Relative %: 71 %
Platelet Count: 146 10*3/uL — ABNORMAL LOW (ref 150–400)
RBC: 3.26 MIL/uL — ABNORMAL LOW (ref 4.22–5.81)
RDW: 14.6 % (ref 11.5–15.5)
WBC Count: 7.7 10*3/uL (ref 4.0–10.5)
nRBC: 0.4 % — ABNORMAL HIGH (ref 0.0–0.2)

## 2022-03-26 LAB — CMP (CANCER CENTER ONLY)
ALT: 15 U/L (ref 0–44)
AST: 14 U/L — ABNORMAL LOW (ref 15–41)
Albumin: 4 g/dL (ref 3.5–5.0)
Alkaline Phosphatase: 67 U/L (ref 38–126)
Anion gap: 8 (ref 5–15)
BUN: 21 mg/dL (ref 8–23)
CO2: 26 mmol/L (ref 22–32)
Calcium: 9 mg/dL (ref 8.9–10.3)
Chloride: 105 mmol/L (ref 98–111)
Creatinine: 1.3 mg/dL — ABNORMAL HIGH (ref 0.61–1.24)
GFR, Estimated: 58 mL/min — ABNORMAL LOW (ref >60)
Glucose, Bld: 220 mg/dL — ABNORMAL HIGH (ref 70–99)
Potassium: 4.5 mmol/L (ref 3.5–5.1)
Sodium: 139 mmol/L (ref 135–145)
Total Bilirubin: 0.5 mg/dL (ref 0.3–1.2)
Total Protein: 7 g/dL (ref 6.5–8.1)

## 2022-03-26 NOTE — Telephone Encounter (Signed)
Noted. Appears CT chest was performed today will await results.

## 2022-03-28 ENCOUNTER — Inpatient Hospital Stay (HOSPITAL_BASED_OUTPATIENT_CLINIC_OR_DEPARTMENT_OTHER): Payer: PPO | Admitting: Internal Medicine

## 2022-03-28 ENCOUNTER — Other Ambulatory Visit: Payer: Self-pay

## 2022-03-28 ENCOUNTER — Encounter: Payer: Self-pay | Admitting: Internal Medicine

## 2022-03-28 VITALS — BP 130/52 | HR 79 | Temp 97.1°F | Resp 20

## 2022-03-28 DIAGNOSIS — J449 Chronic obstructive pulmonary disease, unspecified: Secondary | ICD-10-CM | POA: Diagnosis not present

## 2022-03-28 DIAGNOSIS — C7971 Secondary malignant neoplasm of right adrenal gland: Secondary | ICD-10-CM

## 2022-03-28 DIAGNOSIS — C3412 Malignant neoplasm of upper lobe, left bronchus or lung: Secondary | ICD-10-CM | POA: Diagnosis not present

## 2022-03-28 DIAGNOSIS — C3490 Malignant neoplasm of unspecified part of unspecified bronchus or lung: Secondary | ICD-10-CM | POA: Diagnosis not present

## 2022-03-28 DIAGNOSIS — C3491 Malignant neoplasm of unspecified part of right bronchus or lung: Secondary | ICD-10-CM

## 2022-03-28 DIAGNOSIS — R918 Other nonspecific abnormal finding of lung field: Secondary | ICD-10-CM | POA: Diagnosis not present

## 2022-03-28 DIAGNOSIS — C3431 Malignant neoplasm of lower lobe, right bronchus or lung: Secondary | ICD-10-CM | POA: Diagnosis not present

## 2022-03-28 NOTE — Progress Notes (Signed)
?    [facility] ?[phone]   [phone] ? ?OFFICE PROGRESS NOTE ? ?[name], MD ?[address] ?[place] [address] ? ?DIAGNOSIS: Metastatic non-small cell lung cancer, adenocarcinoma initially diagnosed as stage IB (T2a, N0, M0) non-small cell lung cancer, adenocarcinoma presented with right lower lobe lung mass.  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 ? ?Biomarker Findings ?Tumor Mutational Burden - 11 Muts/Mb ?Microsatellite status - MS-Stable ?Genomic Findings ?For a complete list of the genes assayed, please refer to the Appendix. ?AKT3 amplification ?STK11 P214f*[id number] ?MDM4 amplification - equivocal? ?IKBKE amplification - equivocal? ?PARK2 [id number] ?PARP1 amplification - equivocal? ?PIK3C2B amplification - equivocal? ?SF3B1 K700E ?8 Disease relevant genes with no reportable alterations: ALK, BRAF, ?EGFR, ERBB2, KRAS, MET, RET, ROS1 ? ?PDL 1 expression 0%. ? ?PRIOR THERAPY: ?1) Status post right middle and lower bilobectomies with lymph node dissection under the care of Dr. HeRoxan Hockeyn January 16, 2018. ?2) status post SBRT to the metastatic lesion in the left adrenal gland in July 2020 under the care of Dr. KiSondra Come?3) SBRT to the enlarging right adrenal gland mass under the care of Dr. KiSondra Comen February 2022. ? ?CURRENT THERAPY: Observation. ? ?INTERVAL HISTORY: ?Christian [id number]. male returns to the clinic today for follow-up visit.  The patient continues to have shortness of breath at baseline increased with exertion and he is currently on home oxygen.  Having any chest pain, cough or hemoptysis.  He has no nausea, vomiting, diarrhea or constipation.  He denied having any headache or visual changes.  He has no weight loss or night sweats.  He has no bleeding, bruises or ecchymosis.  He is currently on observation and had repeat CT scan of the chest, abdomen pelvis performed recently.  The patient is here for  evaluation and discussion of his scan results. ? ?MEDICAL HISTORY: ?Past Medical History:  ?Diagnosis Date  ? Anxiety   ? Arthritis   ? [facility]  ? [facility]  ? Coronary artery disease   ? Diabetes mellitus type 2 with complications ([facility]  ? not on medication was "taken off of list"; pt states that he was pre diabetic  ? Dyspnea   ? walks 100 yards then rest  ? GERD (gastroesophageal reflux disease)   ? ocassional no meds; pt states that its no longer an issue (01/14/18)  ? History of radiation therapy   ? Left lung- 10/22/21-10/30/21- Dr. JaGery Pray? History of radiation therapy   ? Left adrenal (abdomen) 06/30/2019-07/09/2019  Dr JaGery Pray? History of radiation therapy   ? right adrenal (abdomen) 01/09/2021-01/19/2021  Dr JaGery Pray? History of shingles   ? 2004  ? Hyperlipidemia   ? Hypertension   ? lung ca dx'd 01/2018  ? right lung  ? Metastasis to adrenal gland ([facility]  ? OSA on CPAP   ? Pneumonia 10/2017  ? ? ?ALLERGIES:  is allergic to celebrex [celecoxib]. ? ?MEDICATIONS:  ?Current Outpatient Medications  ?Medication Sig Dispense Refill  ? albuterol (VENTOLIN HFA) 108 (90 Base) MCG/ACT inhaler Inhale 1-2 puffs into the lungs every 6 (six) hours as needed for wheezing or shortness of breath. 1 each 3  ? aspirin 325 MG tablet Take 162.5 mg by mouth daily.    ? baclofen (LIORESAL) 20 MG tablet TAKE 1 TABLET (20 MG TOTAL) BY MOUTH 2 (TWO) TIMES DAILY AS NEEDED  FOR MUSCLE SPASMS. 180 tablet 1  ? benzonatate (TESSALON) 100 MG capsule Take 1 capsule (100 mg total) by mouth 3 (three) times daily as needed for cough. 20 capsule 0  ? clonazePAM (KLONOPIN) 0.5 MG tablet Take 1 tablet (0.5 mg total) by mouth 2 (two) times daily as needed (for abnormal movement.). For insomnia 60 tablet 1  ? furosemide (LASIX) 20 MG tablet TAKE 1 TAB DAILY AS NEEDED FOR FLUID. (HISTORY OF PHOTOSENSITIVITY WITH CELEBREX USE BUT HAS TOLERATED IT OTHERWISE). 90 tablet 1  ? lisinopril (ZESTRIL) 10 MG tablet Take 10 mg by mouth  daily.    ? Melatonin 10 MG TABS Take 10 mg by mouth at bedtime.     ? metFORMIN (GLUCOPHAGE) 500 MG tablet TAKE 1 TABLET BY MOUTH EVERY DAY WITH BREAKFAST    ? metoprolol succinate (TOPROL-XL) 25 MG 24 hr tablet Take 1 tablet (25 mg total) by mouth at bedtime. 30 tablet 0  ? nitroGLYCERIN (NITROSTAT) 0.4 MG SL tablet Place 1 tablet (0.4 mg total) under the tongue every 5 (five) minutes as needed for chest pain. 25 tablet 1  ? simvastatin (ZOCOR) 40 MG tablet Take 1 tablet (40 mg total) by mouth at bedtime. 90 tablet 3  ? vitamin E 100 UNIT capsule Take 100 Units by mouth daily.    ? ?No current facility-administered medications for this visit.  ? ? ?SURGICAL HISTORY:  ?Past Surgical History:  ?Procedure Laterality Date  ? CARDIAC CATHETERIZATION    ? 2010 @ [facility] (per pt)  ? COLONOSCOPY W/ POLYPECTOMY    ? EYE SURGERY Bilateral   ? cataracts  ? OPEN REDUCTION INTERNAL FIXATION (ORIF) DISTAL RADIAL FRACTURE Right 11/26/2013  ? Procedure: OPEN REDUCTION INTERNAL FIXATION (ORIF) DISTAL RADIAL FRACTURE;  Surgeon: Newt Minion, MD;  Location: [facility];  Service: Orthopedics;  Laterality: Right;  Open Reduction Internal Fixation Right Distal Radius   ? VIDEO ASSISTED THORACOSCOPY (VATS)/ LOBECTOMY Right 01/16/2018  ? Procedure: VIDEO ASSISTED THORACOSCOPY (VATS)/RIGHT MIDDLE AND LOWER LOBE LUNG LOBECTOMY WITH NODE BIOPSIES x6;  Surgeon: Melrose Nakayama, MD;  Location: [facility];  Service: Thoracic;  Laterality: Right;  ? VIDEO BRONCHOSCOPY WITH ENDOBRONCHIAL NAVIGATION Right 12/15/2017  ? Procedure: VIDEO BRONCHOSCOPY WITH ENDOBRONCHIAL NAVIGATION;  Surgeon: Melrose Nakayama, MD;  Location: [facility];  Service: Thoracic;  Laterality: Right;  ? VIDEO BRONCHOSCOPY WITH ENDOBRONCHIAL ULTRASOUND N/A 12/15/2017  ? Procedure: VIDEO BRONCHOSCOPY WITH ENDOBRONCHIAL ULTRASOUND;  Surgeon: Melrose Nakayama, MD;  Location: [facility];  Service: Thoracic;  Laterality: N/A;  ? ? ?REVIEW OF SYSTEMS:  Constitutional: positive for  fatigue ?Eyes: negative ?Ears, nose, mouth, throat, and face: negative ?Respiratory: positive for cough and dyspnea on exertion ?Cardiovascular: negative ?Gastrointestinal: negative ?Genitourinary:negative ?Integument/breast: negative ?Hematologic/lymphatic: negative ?Musculoskeletal:positive for muscle weakness ?Neurological: negative ?Behavioral/Psych: negative ?Endocrine: negative ?Allergic/Immunologic: negative  ? ?PHYSICAL EXAMINATION: General appearance: alert, cooperative, fatigued, and no distress ?Head: Normocephalic, without obvious abnormality, atraumatic ?Neck: no adenopathy, no JVD, supple, symmetrical, trachea midline, and thyroid not enlarged, symmetric, no tenderness/mass/nodules ?Lymph nodes: Cervical, supraclavicular, and axillary nodes normal. ?Resp: clear to auscultation bilaterally ?Back: symmetric, no curvature. ROM normal. No CVA tenderness. ?Cardio: regular rate and rhythm, S1, S2 normal, no murmur, click, rub or gallop ?GI: soft, non-tender; bowel sounds normal; no masses,  no organomegaly ?Extremities: extremities normal, atraumatic, no cyanosis or edema ?Neurologic: Alert and oriented X 3, normal strength and tone. Normal symmetric reflexes. Normal coordination and gait ? ?ECOG PERFORMANCE STATUS: 1 - Symptomatic but  completely ambulatory ? ?Blood pressure (!) 130/52, pulse 79, resp. rate 20, SpO2 96 %. ? ?LABORATORY DATA: ?Lab Results  ?Component Value Date  ? WBC 7.7 03/26/2022  ? HGB 11.2 (L) 03/26/2022  ? HCT 34.4 (L) 03/26/2022  ? MCV 105.5 (H) 03/26/2022  ? PLT 146 (L) 03/26/2022  ? ? ?  Chemistry   ?   ?Component Value Date/Time  ? NA 139 03/26/2022 0749  ? K 4.5 03/26/2022 0749  ? CL 105 03/26/2022 0749  ? CO2 26 03/26/2022 0749  ? BUN 21 03/26/2022 0749  ? CREATININE 1.30 (H) 03/26/2022 0749  ? CREATININE 0.99 07/11/2016 1448  ?    ?Component Value Date/Time  ? CALCIUM 9.0 03/26/2022 0749  ? ALKPHOS 67 03/26/2022 0749  ? AST 14 (L) 03/26/2022 0749  ? ALT 15 03/26/2022 0749  ?  BILITOT 0.5 03/26/2022 0749  ?  ? ? ? ?RADIOGRAPHIC STUDIES: ?CT Abdomen Pelvis Wo Contrast ? ?Result Date: 03/26/2022 ?CLINICAL DATA:  Primary Cancer Type: Lung Imaging Indication: Assess response to th

## 2022-04-03 ENCOUNTER — Telehealth: Payer: Self-pay | Admitting: Medical Oncology

## 2022-04-03 NOTE — Telephone Encounter (Signed)
Pt's son , Christian [name], requested Hospice referral . Is it ok per Dr Julien Nordmann and will he be attending. ?Ok per Dr Julien Nordmann and he will be attending. ?Records faxed to [facility]. ?

## 2022-04-05 DIAGNOSIS — R0609 Other forms of dyspnea: Secondary | ICD-10-CM | POA: Diagnosis not present

## 2022-04-15 DIAGNOSIS — J449 Chronic obstructive pulmonary disease, unspecified: Secondary | ICD-10-CM | POA: Diagnosis not present

## 2022-04-15 DIAGNOSIS — E119 Type 2 diabetes mellitus without complications: Secondary | ICD-10-CM | POA: Diagnosis not present

## 2022-04-15 DIAGNOSIS — Z9981 Dependence on supplemental oxygen: Secondary | ICD-10-CM | POA: Diagnosis not present

## 2022-05-09 DEATH — deceased

## 2022-07-03 ENCOUNTER — Telehealth: Payer: Self-pay | Admitting: Internal Medicine

## 2022-07-03 NOTE — Telephone Encounter (Signed)
Called patient regarding upcoming October appointments, left a voicemail.

## 2022-09-27 ENCOUNTER — Inpatient Hospital Stay: Payer: PPO | Attending: Internal Medicine

## 2022-10-01 ENCOUNTER — Inpatient Hospital Stay: Payer: PPO | Admitting: Internal Medicine
